# Patient Record
Sex: Female | Born: 1950 | Race: White | Hispanic: No | Marital: Married | Smoking: Never smoker
Health system: Southern US, Community
[De-identification: ages and names within clinical notes are randomized; demographics above are authoritative.]

## PROBLEM LIST (undated history)

## (undated) DIAGNOSIS — E785 Hyperlipidemia, unspecified: Secondary | ICD-10-CM

## (undated) DIAGNOSIS — R1310 Dysphagia, unspecified: Secondary | ICD-10-CM

## (undated) DIAGNOSIS — H3500 Unspecified background retinopathy: Secondary | ICD-10-CM

## (undated) DIAGNOSIS — H269 Unspecified cataract: Secondary | ICD-10-CM

## (undated) DIAGNOSIS — I10 Essential (primary) hypertension: Secondary | ICD-10-CM

## (undated) DIAGNOSIS — I639 Cerebral infarction, unspecified: Secondary | ICD-10-CM

## (undated) DIAGNOSIS — M797 Fibromyalgia: Secondary | ICD-10-CM

## (undated) DIAGNOSIS — F0150 Vascular dementia without behavioral disturbance: Secondary | ICD-10-CM

## (undated) DIAGNOSIS — G9340 Encephalopathy, unspecified: Secondary | ICD-10-CM

## (undated) DIAGNOSIS — F418 Other specified anxiety disorders: Secondary | ICD-10-CM

## (undated) DIAGNOSIS — G459 Transient cerebral ischemic attack, unspecified: Secondary | ICD-10-CM

## (undated) DIAGNOSIS — R41841 Cognitive communication deficit: Secondary | ICD-10-CM

## (undated) DIAGNOSIS — I482 Chronic atrial fibrillation, unspecified: Secondary | ICD-10-CM

## (undated) HISTORY — PX: CHOLECYSTECTOMY: SHX55

## (undated) HISTORY — DX: Other specified anxiety disorders: F41.8

## (undated) HISTORY — DX: Fibromyalgia: M79.7

## (undated) HISTORY — PX: ABDOMINAL HYSTERECTOMY: SHX81

## (undated) HISTORY — PX: LOOP RECORDER IMPLANT: SHX5954

## (undated) HISTORY — DX: Transient cerebral ischemic attack, unspecified: G45.9

## (undated) HISTORY — DX: Essential (primary) hypertension: I10

## (undated) HISTORY — DX: Hyperlipidemia, unspecified: E78.5

---

## 1997-09-14 ENCOUNTER — Encounter: Admission: RE | Admit: 1997-09-14 | Discharge: 1997-12-13 | Payer: Self-pay | Admitting: Family Medicine

## 1998-02-24 ENCOUNTER — Other Ambulatory Visit: Admission: RE | Admit: 1998-02-24 | Discharge: 1998-02-24 | Payer: Self-pay | Admitting: Family Medicine

## 1999-03-03 ENCOUNTER — Encounter: Admission: RE | Admit: 1999-03-03 | Discharge: 1999-04-05 | Payer: Self-pay | Admitting: Family Medicine

## 2003-04-28 ENCOUNTER — Ambulatory Visit (HOSPITAL_COMMUNITY): Admission: RE | Admit: 2003-04-28 | Discharge: 2003-04-28 | Payer: Self-pay | Admitting: Orthopaedic Surgery

## 2003-04-28 ENCOUNTER — Encounter: Payer: Self-pay | Admitting: Orthopaedic Surgery

## 2003-09-21 ENCOUNTER — Ambulatory Visit (HOSPITAL_COMMUNITY): Admission: RE | Admit: 2003-09-21 | Discharge: 2003-09-21 | Payer: Self-pay | Admitting: Family Medicine

## 2004-06-27 ENCOUNTER — Ambulatory Visit: Payer: Self-pay | Admitting: Family Medicine

## 2004-07-03 ENCOUNTER — Ambulatory Visit: Payer: Self-pay | Admitting: Family Medicine

## 2004-08-23 ENCOUNTER — Ambulatory Visit: Payer: Self-pay | Admitting: Family Medicine

## 2004-10-02 ENCOUNTER — Ambulatory Visit: Payer: Self-pay | Admitting: Family Medicine

## 2004-10-09 ENCOUNTER — Ambulatory Visit: Payer: Self-pay | Admitting: Family Medicine

## 2004-11-08 ENCOUNTER — Ambulatory Visit: Payer: Self-pay | Admitting: Family Medicine

## 2004-12-19 ENCOUNTER — Ambulatory Visit: Payer: Self-pay | Admitting: Family Medicine

## 2005-01-23 ENCOUNTER — Ambulatory Visit: Payer: Self-pay | Admitting: Family Medicine

## 2005-03-12 ENCOUNTER — Ambulatory Visit: Payer: Self-pay | Admitting: Family Medicine

## 2005-04-12 ENCOUNTER — Ambulatory Visit: Payer: Self-pay | Admitting: Family Medicine

## 2005-06-05 ENCOUNTER — Ambulatory Visit: Payer: Self-pay | Admitting: Family Medicine

## 2005-06-06 ENCOUNTER — Ambulatory Visit: Payer: Self-pay | Admitting: Family Medicine

## 2005-06-14 ENCOUNTER — Ambulatory Visit: Payer: Self-pay | Admitting: Family Medicine

## 2005-06-18 ENCOUNTER — Ambulatory Visit: Payer: Self-pay | Admitting: Family Medicine

## 2005-07-04 ENCOUNTER — Ambulatory Visit: Payer: Self-pay | Admitting: Cardiology

## 2005-07-17 ENCOUNTER — Ambulatory Visit: Payer: Self-pay

## 2005-07-19 ENCOUNTER — Ambulatory Visit: Payer: Self-pay

## 2005-08-08 ENCOUNTER — Ambulatory Visit: Payer: Self-pay | Admitting: Cardiology

## 2005-10-09 ENCOUNTER — Ambulatory Visit: Payer: Self-pay | Admitting: Family Medicine

## 2005-10-16 ENCOUNTER — Ambulatory Visit (HOSPITAL_COMMUNITY): Admission: RE | Admit: 2005-10-16 | Discharge: 2005-10-16 | Payer: Self-pay | Admitting: Family Medicine

## 2005-11-05 ENCOUNTER — Ambulatory Visit: Payer: Self-pay | Admitting: Family Medicine

## 2006-01-01 ENCOUNTER — Ambulatory Visit: Payer: Self-pay | Admitting: Family Medicine

## 2006-01-16 ENCOUNTER — Ambulatory Visit: Payer: Self-pay | Admitting: Family Medicine

## 2006-03-26 ENCOUNTER — Ambulatory Visit: Payer: Self-pay | Admitting: Family Medicine

## 2006-04-24 ENCOUNTER — Ambulatory Visit: Payer: Self-pay | Admitting: Family Medicine

## 2006-05-28 ENCOUNTER — Ambulatory Visit: Payer: Self-pay | Admitting: Family Medicine

## 2006-06-17 ENCOUNTER — Ambulatory Visit: Payer: Self-pay | Admitting: Family Medicine

## 2006-07-31 ENCOUNTER — Ambulatory Visit: Payer: Self-pay | Admitting: Family Medicine

## 2006-09-04 ENCOUNTER — Ambulatory Visit: Payer: Self-pay | Admitting: Family Medicine

## 2006-10-14 ENCOUNTER — Ambulatory Visit: Payer: Self-pay | Admitting: Family Medicine

## 2006-10-24 ENCOUNTER — Ambulatory Visit: Payer: Self-pay | Admitting: Family Medicine

## 2007-01-23 ENCOUNTER — Ambulatory Visit: Payer: Self-pay | Admitting: Family Medicine

## 2008-01-07 ENCOUNTER — Encounter: Payer: Self-pay | Admitting: Internal Medicine

## 2008-01-07 ENCOUNTER — Inpatient Hospital Stay (HOSPITAL_COMMUNITY): Admission: EM | Admit: 2008-01-07 | Discharge: 2008-01-08 | Payer: Self-pay | Admitting: Emergency Medicine

## 2008-01-07 ENCOUNTER — Ambulatory Visit: Payer: Self-pay | Admitting: Internal Medicine

## 2008-01-13 ENCOUNTER — Ambulatory Visit: Payer: Self-pay

## 2008-01-14 ENCOUNTER — Ambulatory Visit: Payer: Self-pay

## 2010-09-10 ENCOUNTER — Encounter: Payer: Self-pay | Admitting: Family Medicine

## 2011-01-02 NOTE — Discharge Summary (Signed)
NAMEALLEX, KASSON                  ACCOUNT NO.:  [id number]   MEDICAL RECORD NO.:  [id number]          PATIENT TYPE:  INP   LOCATION:  2037                         FACILITY:  [facility]   PHYSICIAN:  Bevelyn Buckles. Bensimhon, MDDATE OF BIRTH:  Dec 18, 1950   DATE OF ADMISSION:  01/07/2008  DATE OF DISCHARGE:  01/08/2008                               DISCHARGE SUMMARY   PROCEDURES:  None.   PRIMARY FINAL DISCHARGE DIAGNOSIS:  Chest pain, cardiac enzymes negative  for myocardial infarction and outpatient Myoview planned.   SECONDARY DIAGNOSES:  1. Allergy to AVITA.  2. Diabetes.  3. Obesity.  4. Hyperlipidemia.  5. Hypothyroidism.  6. Hypertension.  7. Status post cerebrovascular accident in 2005, at [place].  8. Obstructive sleep apnea, on continuous positive airway pressure.  9. Status post surgeries, C-sections,cholecystectomy, tonsillectomy,      and hysterectomy.   FAMILY HISTORY:  Coronary artery disease in her father.   TIME AT DISCHARGE:  36 minutes.   HOSPITAL COURSE:  Ms. Koff is a 60 year old female with no known  history of coronary artery disease.  She had chest pain and went to her  family physician's office.  Her heart rate was 137 and she was  hypertensive.  Her temperature was 100.5.  She had nausea and vomiting.  She was transported to the hospital and admitted for further evaluation.   Her GI symptoms were treated symptomatically and resolved.  Her cardiac  enzymes were negative for MI.  Her chest x-ray was without infiltrate or  edema and her urinalysis was negative.   On Jan 08, 2008, Ms. Hable' GI symptoms had resolved.  Her chest pain  had also resolved.  Her vital signs were within normal limits and she  was afebrile.  Her cardiac enzymes remained negative.  Her EKG was not  acute.  It was felt that she could be safely discharged home as she was  significantly improved and follow up in the office as an outpatient.   DISCHARGE INSTRUCTIONS:  1. Her activity  level is to be increased gradually.  2. She is to stick to a low-fat diabetic diet.  3. She is to follow up in our office with a stress test, on Jan 13, 2008, at 12:30 and with Dr. Gala Romney, on February 10, 2008, at 2:00      p.m..  4. She is to follow up with Dr. Lysbeth Galas, as needed.   DISCHARGE MEDICATIONS:  1. Synthroid 100 mcg daily.  2. Actos 45 mg a day.  3. Janumet 50/1000 mg daily.  4. Nexium 40 mg a day.  5. Metformin 500 mg b.i.d.  6. Avapro 3 mg daily.  7. Tarka 4/240 daily.  8. Ativan 1 mg b.i.d.  9. Effexor XR 150 mg daily.  10.Vytorin 10/20 daily.  11.Triamterene/HCTZ 37.5/25 daily.  12.Aspirin 81 mg daily.  13.Toprol XL 100 mg daily.  14.B12 injections every month.  15.Byetta 5 units daily.      Theodore Demark, PA-C      Bevelyn Buckles. Bensimhon, MD  Electronically Signed  RB/MEDQ  D:  01/08/2008  T:  01/09/2008  Job:  [id number]   cc:   Delaney Meigs, M.D.

## 2011-01-02 NOTE — H&P (Signed)
Dawn Ho, Dawn Ho                  ACCOUNT NO.:  [id number]   MEDICAL RECORD NO.:  [id number]          PATIENT TYPE:  INP   LOCATION:  2037                         FACILITY:  [facility]   PHYSICIAN:  Joellyn Rued, PA-C     DATE OF BIRTH:  Apr 30, 1951   DATE OF ADMISSION:  01/07/2008  DATE OF DISCHARGE:                              HISTORY & PHYSICAL   SUMMARY OF HISTORY:  Dawn Ho is a 60 year old white female who is  referred to [facility] Emergency Room from her primary care's office  secondary to chest discomfort.   Dawn Ho states that she was awakened at approximately 0900, and she  stated that she felt weird.  She noticed an increased heart rate;  however, she did not check her heart rate, blood pressure or her sugar.  She did have associated nausea and vomiting, and then she noticed an  anterior dull chest discomfort that did not radiate nor was it  associated with shortness of breath or diaphoresis.  The patient became  very anxious, began walking around.  Her discomfort did not change with  exertion nor was it pleuritic or changed with movement.  She called her  husband who returned home and checked her blood sugar, which was 227.  He took her to our primary care physician's office.  During transfer to  the doctor, he noticed that her hands were shaking and she continued to  have nausea and vomiting.  The patient denies any prior occurrence and  feels that her symptoms do not resemble her prior heart attack at Dr.  Lysbeth Galas' office where her temperature was checked, but there was a  question about the reliability of the thermometer.  An EKG showed sinus  tachycardia, left axis deviation, no acute changes, delayed R-wave.  EMS  arrived, and they checked her temperature and that was 101.  Her  daughter recalls that her systolic blood pressure was 128, and she  received IV Phenergan during transportation with improvement of her  nausea and vomiting.  Here in the emergency room, she  is fairly  comfortable, but continued to feel her heart racing.   PAST MEDICAL HISTORY:  Notable for IV contrast dye allergy.   MEDICATIONS:  Prior to admission include:  1. Synthroid 100 mcg daily.  2. Avapro 300 mg daily.  3. Nexium 40 mg daily.  4. Maxzide 37.5/25 mg daily.  5. Metformin 500 mg 2 tablets b.i.d.  6. Vytorin 10/20 mg daily.  7. Metoprolol ER 150 mg daily.  8. Tarka 4/240 mg daily.  9. Janumet 50/1000 mg daily.  10.Effexor 150 mg daily.  11.Actos 45 mg daily.  12.Aspirin 81 mg daily.  13.Byetta 10 half a tablet b.i.d.   SUMMARY OF HISTORY:  Her medical history is notable for Hyperlipidemia.  Dr. Lysbeth Galas checked her cholesterol on Dec 23, 2007; this showed total  cholesterol  191, triglycerides 235, HDL 55, LDL 89.  Type 2 diabetes.  On Dec 23, 2007, her hemoglobin A1c was elevated at 7.9.  Hyperthyroidism.  On Dec 23, 2007, TSH was 3.094.  She has  a history of  hypertension, CVA in 2005, which presented as difficulty finding the  proper words.  She has a residual short-term memory loss.  She was  hospitalized at [facility].  Specifics are not available.  She  does not recall who her neurologist is.  She has a history of  obstructive sleep apnea with CPAP, hypertension.  An adenosine Myoview  done in our office on November 2006, showed an EF of 88% with some  inferolateral attenuation.   SURGICAL HISTORY:  Notable for a foot surgery, two C-sections,  cholecystectomy, T&A, and hysterectomy.   SOCIAL HISTORY:  She resides in [place] with her husband.  She has one  son, one daughter who are both alive and well.  She is retired from  [employer].  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  She denies any tobacco, alcohol, drugs, or herbal  medication.  She does not follow a specific diet nor does she exercise.   FAMILY HISTORY:  Her mother is alive at the age of 66 with a history of  hypertension.  Her father died at the age of  85 with his first  myocardial infarction, history of hypertension and diabetes.  She has 2  sisters, one has had a stroke and the other has diabetes.  She has one  brother that died at the age of 3 secondary to a gunshot wound.   REVIEW OF SYSTEMS:  Noted for significant weight gain; however, the  patient cannot relate to how much over how long of a time period.  She  also describes a history of sinus congestion, glasses, some rotten teeth  in the back, dry skin, chronic dyspnea on exertion, nocturia,  postmenopausal depression, anxiety, arthritis, myalgias consistent with  fibromyalgia.  She describes bright red blood per rectum secondary to  hemorrhoids and a history of GERD and peptic ulcer disease.   PHYSICAL EXAMINATION:  GENERAL:  A well-nourished, well-developed  pleasant obese white female in no apparent distress.  Husband is  present.  VITAL SIGNS:  Temperature is 100.5, blood pressure 138/62, heart rate  was 137, and respirations 20.  HEENT:  Grossly unremarkable.  NECK:  Supple without thyromegaly, adenopathy, JVD, or carotid bruits.  CHEST:  Symmetrical excursion.  Lungs sounds were diminished, but clear  to auscultation.  HEART:  PMI is not displaced.  Regular rate and rhythm with a normal S1  and S2.  I did not appreciate murmurs, rubs, clicks, or gallops.  All  pulses are symmetrical and intact.  I did not appreciate any abdominal  bruits.  SKIN:  Integument intact although dry.  ABDOMEN:  Obese.  Bowel sounds are present without organomegaly, masses,  or tenderness.  EXTREMITIES:  Negative cyanosis, clubbing, or edema.  MUSCULOSKELETAL:  Unremarkable.  NEUROLOGICAL:  Unremarkable.   Chest x-ray shows cardiomegaly with central vascular congestion and  bilateral atelectasis.  No signs of edema or pneumonia.  H&H is 11.5 and  34.3, normal indices, platelets 197, WBC is 11.2.  Sodium 139, potassium  3.8, BUN 12, creatinine 0.98, glucose was elevated at 263.  Normal  LFTs.  PTT 24, PT 7.9, CK and MB 67 and 0.7 respectively, and troponin 0.04.   IMPRESSION:  1. Febrile illness with nausea, vomiting, and diarrhea of uncertain      etiology.  2. Sinus tachycardia associated with above.  3. Chest discomfort associated with above with initial negative EKG      and  enzymes.  4. Dye allergy.  5. History as listed per past medical history.   DISPOSITION:  Dr. Gala Romney reviewed the patient's history, spoke with,  and examined the patient and agrees with the above.  We will admit her  and place her on IV fluids.  Her tachycardia seems out of proportion to  her other symptoms.  We will check a urinalysis culture as well as her  stools for C. diff, ova and parasites.  We will also check  a D-dimer echocardiogram, rule out myocardial infarction.  It is felt  that her chest discomfort is associated with the above symptoms.  This  is very atypical and at this time does not seem to represent a cardiac  etiology.  Future recommendations are based on findings.      Joellyn Rued, PA-C     EW/MEDQ  D:  01/07/2008  T:  01/08/2008  Job:  [id number]   cc:   Delaney Meigs, M.D.

## 2011-05-16 LAB — CBC
HCT: 32.7 — ABNORMAL LOW
MCHC: 33.7
MCV: 82.8
Platelets: 195
Platelets: 197
RDW: 15.8 — ABNORMAL HIGH

## 2011-05-16 LAB — TROPONIN I: Troponin I: 0.04

## 2011-05-16 LAB — URINALYSIS, ROUTINE W REFLEX MICROSCOPIC
Hgb urine dipstick: NEGATIVE
Nitrite: NEGATIVE
Specific Gravity, Urine: 1.019
Urobilinogen, UA: 0.2

## 2011-05-16 LAB — CARDIAC PANEL(CRET KIN+CKTOT+MB+TROPI)
CK, MB: 0.8
CK, MB: 1.1
Total CK: 86
Troponin I: 0.03

## 2011-05-16 LAB — DIFFERENTIAL
Basophils Absolute: 0
Eosinophils Relative: 1
Lymphocytes Relative: 12
Neutro Abs: 8 — ABNORMAL HIGH
Neutrophils Relative %: 82 — ABNORMAL HIGH

## 2011-05-16 LAB — COMPREHENSIVE METABOLIC PANEL
BUN: 12
CO2: 23
Calcium: 9.1
Creatinine, Ser: 0.95
GFR calc non Af Amer: >60
Glucose, Bld: 263 — ABNORMAL HIGH

## 2011-05-16 LAB — CK TOTAL AND CKMB (NOT AT ARMC)
Relative Index: INVALID
Total CK: 67

## 2011-05-16 LAB — OCCULT BLOOD X 1 CARD TO LAB, STOOL
Fecal Occult Bld: NEGATIVE
Fecal Occult Bld: NEGATIVE

## 2011-05-16 LAB — APTT: aPTT: 24

## 2011-05-16 LAB — CLOSTRIDIUM DIFFICILE EIA: C difficile Toxins A+B, EIA: NEGATIVE

## 2011-05-16 LAB — PROTIME-INR
INR: 0.9
Prothrombin Time: 11.9

## 2011-05-16 LAB — URINE CULTURE

## 2011-05-16 LAB — URINE MICROSCOPIC-ADD ON

## 2011-05-16 LAB — D-DIMER, QUANTITATIVE: D-Dimer, Quant: 0.43

## 2011-05-16 LAB — LIPASE, BLOOD: Lipase: 24

## 2011-08-07 ENCOUNTER — Ambulatory Visit (INDEPENDENT_AMBULATORY_CARE_PROVIDER_SITE_OTHER): Payer: Medicare Other | Admitting: Cardiology

## 2011-08-07 ENCOUNTER — Encounter: Payer: Self-pay | Admitting: Cardiology

## 2011-08-07 ENCOUNTER — Other Ambulatory Visit: Payer: Self-pay | Admitting: Cardiology

## 2011-08-07 ENCOUNTER — Encounter: Payer: Self-pay | Admitting: *Deleted

## 2011-08-07 ENCOUNTER — Telehealth: Payer: Self-pay | Admitting: *Deleted

## 2011-08-07 DIAGNOSIS — M797 Fibromyalgia: Secondary | ICD-10-CM

## 2011-08-07 DIAGNOSIS — IMO0001 Reserved for inherently not codable concepts without codable children: Secondary | ICD-10-CM

## 2011-08-07 DIAGNOSIS — F341 Dysthymic disorder: Secondary | ICD-10-CM

## 2011-08-07 DIAGNOSIS — E785 Hyperlipidemia, unspecified: Secondary | ICD-10-CM

## 2011-08-07 DIAGNOSIS — E119 Type 2 diabetes mellitus without complications: Secondary | ICD-10-CM

## 2011-08-07 DIAGNOSIS — F418 Other specified anxiety disorders: Secondary | ICD-10-CM

## 2011-08-07 DIAGNOSIS — R079 Chest pain, unspecified: Secondary | ICD-10-CM

## 2011-08-07 DIAGNOSIS — M940 Chondrocostal junction syndrome [Tietze]: Secondary | ICD-10-CM

## 2011-08-07 DIAGNOSIS — E669 Obesity, unspecified: Secondary | ICD-10-CM

## 2011-08-07 DIAGNOSIS — Z8673 Personal history of transient ischemic attack (TIA), and cerebral infarction without residual deficits: Secondary | ICD-10-CM

## 2011-08-07 NOTE — Assessment & Plan Note (Signed)
Patient's chest pain is very atypical. The pain is nonexertional. Pain is reproducible with palpation and is reminiscent of her fibromyalgias. The patient does have multiple risk factors and a high pretest probability of coronary artery disease and therefore we will proceed with a two-day Lexa scan protocol. If within normal limits however I do not think the patient needs a cardiac catheterization. I made no changes in her medical regimen but I did add when necessary nitroglycerin. She can continue on aspirin the meanwhile.

## 2011-08-07 NOTE — Telephone Encounter (Signed)
LEXISCAN CARDIOLITE  (2 DAY) WEIGHT 239  5'  Scheduled for 12/27 & 12/28 @ [facility] Checking pecert

## 2011-08-07 NOTE — Assessment & Plan Note (Signed)
Patient does have acute costochondritis.

## 2011-08-07 NOTE — Assessment & Plan Note (Signed)
No recurrence in several years. I suspect the patient has been screened with carotid Dopplers. I will leave further evaluation and workup is needed in the future to her primary care physician

## 2011-08-07 NOTE — Assessment & Plan Note (Signed)
I counseled the patient regarding increasing her activity and try to do daily walking as is his good therapy for fibromyalgias diabetes mellitus and obesity.

## 2011-08-07 NOTE — Assessment & Plan Note (Signed)
Fibromyalgias are not controlled with gabapentin. I recommended to the patient to discuss with her primary care physician the use of Sevella and Cymbalta, rather than Paxil.

## 2011-08-07 NOTE — Progress Notes (Signed)
Dawn Bottoms, MD, [facility] Board Certified in Adult Cardiovascular Medicine,Internal Medicine and Critical Care Medicine    CC: Chest pain  HPI:  The patient is a 60 year old female with no prior history of coronary artery disease. According to the patient's history she's been evaluated with what appears to be stress testing maybe 5 years ago in [place]. Since that time she's had no chest pain. She has a long-standing history of fibromyalgia affecting multiple areas including shoulders back neck buttocks and thighs. She stated her fibromyalgia started in 1993 after stressful event involving her niece with died after heart transplant. Since that time the patient also has struggled with anxiety and depression. The patient has now been referred by Dr. Virgina Organ because of a history of approximately 4 days of left-sided chest pain. The patient experiences pain as a deep pain she described this as a 2/10 in intensity typically last one hour and is not worsened with exertion. It appears to occur at rest. She states that there is a particular area of the left lower sternum where she applies pressure and she has severe pain. There is no associated shortness of breath or diaphoresis. Despite her obesity she states that he has good exercise tolerance. She's not limited in performing her daily activities. She also reports no palpitations, presyncope or syncope. EKG today shows no acute changes.     PMH: reviewed and listed in Problem List in Electronic Records (and see below) Past Medical History  Diagnosis Date  . Fibromyalgia   . Diabetes mellitus   . Hypertension   . Dyslipidemia   . TIA (transient ischemic attack)   . Depression with anxiety    Past Surgical History  Procedure Date  . Cholecystectomy   . Cesarean section       Allergies/SH/FHX : available in Electronic Records for review Allergies  Allergen Reactions  . Ivp Dye (Iodinated Diagnostic Agents)   . Penicillins  Swelling and Rash   History   Social History  . Marital Status: Married    Spouse Name: N/A    Number of Children: N/A  . Years of Education: N/A   Occupational History  . Not on file.   Social History Main Topics  . Smoking status: Never Smoker   . Smokeless tobacco: Never Used  . Alcohol Use: No  . Drug Use: No  . Sexually Active: Not on file   Other Topics Concern  . Not on file   Social History Narrative  . No narrative on file   Family History  Problem Relation Age of Onset  . Heart disease Father     Medications: Current Outpatient Prescriptions  Medication Sig Dispense Refill  . acetaminophen (TYLENOL) 500 MG tablet Take 500 mg by mouth every 6 (six) hours as needed.        Marland Kitchen amLODipine (NORVASC) 10 MG tablet Take 10 mg by mouth daily.        Marland Kitchen aspirin EC 81 MG tablet Take 81 mg by mouth daily.        . cyanocobalamin (,VITAMIN B-12,) 1000 MCG/ML injection Inject 1,000 mcg into the muscle every 30 (thirty) days.        . ergocalciferol (VITAMIN D2) 50000 UNITS capsule Take 50,000 Units by mouth once a week.        . furosemide (LASIX) 40 MG tablet Take 40 mg by mouth daily.        Marland Kitchen gabapentin (NEURONTIN) 300 MG capsule Take 300 mg by mouth  2 (two) times daily.        Marland Kitchen glipiZIDE (GLUCOTROL XL) 10 MG 24 hr tablet Take 10 mg by mouth daily.        . insulin NPH-insulin regular (NOVOLIN 70/30) (70-30) 100 UNIT/ML injection Inject into the skin as directed.        . insulin regular (NOVOLIN R,HUMULIN R) 100 units/mL injection Inject into the skin as directed.        Marland Kitchen levothyroxine (SYNTHROID, LEVOTHROID) 125 MCG tablet Take 125 mcg by mouth daily.        Marland Kitchen LORazepam (ATIVAN) 1 MG tablet Take 1 mg by mouth 2 (two) times daily.        Marland Kitchen losartan (COZAAR) 100 MG tablet Take 100 mg by mouth daily.        . meclizine (ANTIVERT) 12.5 MG tablet Take 12.5 mg by mouth 2 (two) times daily.        . meloxicam (MOBIC) 7.5 MG tablet Take 7.5 mg by mouth daily.        .  metFORMIN (GLUCOPHAGE) 500 MG tablet Take 500 mg by mouth 2 (two) times daily with a meal.        . metoprolol (TOPROL-XL) 100 MG 24 hr tablet Take 150 mg by mouth daily.        Marland Kitchen omeprazole (PRILOSEC) 20 MG capsule Take 20 mg by mouth daily.        Marland Kitchen PARoxetine (PAXIL) 20 MG tablet Take 20 mg by mouth every morning.        . potassium chloride SA (K-DUR,KLOR-CON) 20 MEQ tablet Take 20 mEq by mouth daily.        . simvastatin (ZOCOR) 20 MG tablet Take 20 mg by mouth at bedtime.          ROS: No nausea or vomiting. No fever or chills.No melena or hematochezia.No bleeding.No claudication. No visual changes. Multiple areas of muscle pain as described above.  Physical Exam: BP 138/77  Pulse 76  Ht 5' (1.524 m)  Wt 239 lb (108.41 kg)  BMI 46.68 kg/m2 General: Overweight white female but in no apparent distress. Neck: JVP is not measurable. No thyromegaly nonnodular thyroid. Normal carotid upstroke and no carotid bruits. Lungs: Clear breath sounds bilaterally without wheezing Cardiac: Regular rate and rhythm with normal S1-S2. No pathological murmurs. No S3 or S4 Chest: Tenderness on minimal palpation of the left costal sternal edge. Vascular: No edema. Normal dorsalis pedis and posterior tibial pulses Skin: Warm and dry  12lead ECG: Normal sinus rhythm with no acute changes despite the fact that the patient is having chest pain. Limited bedside ECHO: Technically difficult but apparent normal LV systolic function as well as normal RV systolic function. Aortic valve and mitral valve appeared to open normally   Assessment and Plan

## 2011-08-07 NOTE — Patient Instructions (Signed)
   Lexiscan stress test - 2 day protocol If the results of your test are normal or stable, you will receive a letter.  If they are abnormal, the nurse will contact you by phone. Follow up in  1 month - see above

## 2011-08-08 NOTE — Telephone Encounter (Signed)
PT HAS BLUE MCR.  ZOXW#[id number] EXP 09-06-11

## 2011-08-30 ENCOUNTER — Telehealth: Payer: Self-pay | Admitting: *Deleted

## 2011-08-30 NOTE — Telephone Encounter (Signed)
Lexiscan stress test - 2 day protocol Per Dr. Andee Lineman Weight  239 Diag 786.50  Monday and Tuesday, January 28th and 29th , 2013 @ [facility]   Her other authorization expires on the 17th and patient states she can not go test before this date.

## 2011-09-13 ENCOUNTER — Ambulatory Visit: Payer: Medicare Other | Admitting: Cardiology

## 2011-09-14 NOTE — Telephone Encounter (Signed)
[employer] VWUJ#[id number] Exp 10-12-11

## 2014-09-30 ENCOUNTER — Telehealth: Payer: Self-pay | Admitting: Family Medicine

## 2014-09-30 NOTE — Telephone Encounter (Signed)
Pt wtbs as a new pt, pt was given new pt appt with Dr.Miller 3/23 at 10:30, pt aware to arrive 15 minutes prior to appt with a copy of insurance card and list of all current medications.

## 2014-10-11 ENCOUNTER — Telehealth: Payer: Self-pay | Admitting: Family Medicine

## 2014-10-11 NOTE — Telephone Encounter (Signed)
Patient was advised to call previous provider and see if he will give her enough medication until she is seen by her new provider.

## 2014-10-13 ENCOUNTER — Encounter (HOSPITAL_COMMUNITY): Payer: Self-pay | Admitting: *Deleted

## 2014-10-13 ENCOUNTER — Inpatient Hospital Stay (HOSPITAL_COMMUNITY): Payer: Medicare Other

## 2014-10-13 ENCOUNTER — Inpatient Hospital Stay (HOSPITAL_COMMUNITY)
Admission: EM | Admit: 2014-10-13 | Discharge: 2014-10-18 | DRG: 065 | Disposition: A | Discharge status: Skilled Nursing Facility | Payer: Medicare Other | Attending: Family Medicine | Admitting: Family Medicine

## 2014-10-13 ENCOUNTER — Emergency Department (HOSPITAL_COMMUNITY): Payer: Medicare Other

## 2014-10-13 DIAGNOSIS — Z794 Long term (current) use of insulin: Secondary | ICD-10-CM

## 2014-10-13 DIAGNOSIS — I6340 Cerebral infarction due to embolism of unspecified cerebral artery: Secondary | ICD-10-CM | POA: Diagnosis not present

## 2014-10-13 DIAGNOSIS — W06XXXA Fall from bed, initial encounter: Secondary | ICD-10-CM | POA: Diagnosis present

## 2014-10-13 DIAGNOSIS — R4701 Aphasia: Secondary | ICD-10-CM | POA: Diagnosis present

## 2014-10-13 DIAGNOSIS — Z91041 Radiographic dye allergy status: Secondary | ICD-10-CM | POA: Diagnosis not present

## 2014-10-13 DIAGNOSIS — E039 Hypothyroidism, unspecified: Secondary | ICD-10-CM | POA: Diagnosis present

## 2014-10-13 DIAGNOSIS — R29810 Facial weakness: Secondary | ICD-10-CM | POA: Diagnosis present

## 2014-10-13 DIAGNOSIS — F418 Other specified anxiety disorders: Secondary | ICD-10-CM | POA: Diagnosis present

## 2014-10-13 DIAGNOSIS — I6602 Occlusion and stenosis of left middle cerebral artery: Secondary | ICD-10-CM | POA: Diagnosis not present

## 2014-10-13 DIAGNOSIS — M797 Fibromyalgia: Secondary | ICD-10-CM | POA: Diagnosis present

## 2014-10-13 DIAGNOSIS — I6529 Occlusion and stenosis of unspecified carotid artery: Secondary | ICD-10-CM | POA: Diagnosis not present

## 2014-10-13 DIAGNOSIS — M79642 Pain in left hand: Secondary | ICD-10-CM | POA: Diagnosis not present

## 2014-10-13 DIAGNOSIS — I639 Cerebral infarction, unspecified: Secondary | ICD-10-CM

## 2014-10-13 DIAGNOSIS — Z7982 Long term (current) use of aspirin: Secondary | ICD-10-CM | POA: Diagnosis not present

## 2014-10-13 DIAGNOSIS — E785 Hyperlipidemia, unspecified: Secondary | ICD-10-CM | POA: Diagnosis not present

## 2014-10-13 DIAGNOSIS — R41841 Cognitive communication deficit: Secondary | ICD-10-CM | POA: Diagnosis not present

## 2014-10-13 DIAGNOSIS — E669 Obesity, unspecified: Secondary | ICD-10-CM | POA: Diagnosis not present

## 2014-10-13 DIAGNOSIS — G459 Transient cerebral ischemic attack, unspecified: Secondary | ICD-10-CM | POA: Diagnosis not present

## 2014-10-13 DIAGNOSIS — I69828 Other speech and language deficits following other cerebrovascular disease: Secondary | ICD-10-CM | POA: Diagnosis not present

## 2014-10-13 DIAGNOSIS — M6281 Muscle weakness (generalized): Secondary | ICD-10-CM | POA: Diagnosis not present

## 2014-10-13 DIAGNOSIS — E119 Type 2 diabetes mellitus without complications: Secondary | ICD-10-CM | POA: Diagnosis not present

## 2014-10-13 DIAGNOSIS — I63031 Cerebral infarction due to thrombosis of right carotid artery: Secondary | ICD-10-CM | POA: Diagnosis not present

## 2014-10-13 DIAGNOSIS — R278 Other lack of coordination: Secondary | ICD-10-CM | POA: Diagnosis not present

## 2014-10-13 DIAGNOSIS — R079 Chest pain, unspecified: Secondary | ICD-10-CM | POA: Diagnosis not present

## 2014-10-13 DIAGNOSIS — Z880 Allergy status to penicillin: Secondary | ICD-10-CM | POA: Diagnosis not present

## 2014-10-13 DIAGNOSIS — F329 Major depressive disorder, single episode, unspecified: Secondary | ICD-10-CM | POA: Diagnosis not present

## 2014-10-13 DIAGNOSIS — Z79899 Other long term (current) drug therapy: Secondary | ICD-10-CM | POA: Diagnosis not present

## 2014-10-13 DIAGNOSIS — R42 Dizziness and giddiness: Secondary | ICD-10-CM | POA: Diagnosis not present

## 2014-10-13 DIAGNOSIS — R262 Difficulty in walking, not elsewhere classified: Secondary | ICD-10-CM | POA: Diagnosis not present

## 2014-10-13 DIAGNOSIS — I10 Essential (primary) hypertension: Secondary | ICD-10-CM | POA: Diagnosis not present

## 2014-10-13 DIAGNOSIS — E08329 Diabetes mellitus due to underlying condition with mild nonproliferative diabetic retinopathy without macular edema: Secondary | ICD-10-CM | POA: Diagnosis not present

## 2014-10-13 DIAGNOSIS — Z6841 Body Mass Index (BMI) 40.0 and over, adult: Secondary | ICD-10-CM

## 2014-10-13 DIAGNOSIS — I480 Paroxysmal atrial fibrillation: Secondary | ICD-10-CM | POA: Diagnosis not present

## 2014-10-13 DIAGNOSIS — I6310 Cerebral infarction due to embolism of unspecified precerebral artery: Secondary | ICD-10-CM | POA: Diagnosis not present

## 2014-10-13 DIAGNOSIS — R51 Headache: Secondary | ICD-10-CM | POA: Diagnosis not present

## 2014-10-13 DIAGNOSIS — R531 Weakness: Secondary | ICD-10-CM | POA: Diagnosis not present

## 2014-10-13 DIAGNOSIS — I6601 Occlusion and stenosis of right middle cerebral artery: Secondary | ICD-10-CM | POA: Diagnosis not present

## 2014-10-13 DIAGNOSIS — F419 Anxiety disorder, unspecified: Secondary | ICD-10-CM | POA: Diagnosis not present

## 2014-10-13 DIAGNOSIS — I6931 Cognitive deficits following cerebral infarction: Secondary | ICD-10-CM | POA: Diagnosis not present

## 2014-10-13 DIAGNOSIS — R404 Transient alteration of awareness: Secondary | ICD-10-CM | POA: Diagnosis not present

## 2014-10-13 DIAGNOSIS — I340 Nonrheumatic mitral (valve) insufficiency: Secondary | ICD-10-CM | POA: Diagnosis not present

## 2014-10-13 HISTORY — DX: Cerebral infarction, unspecified: I63.9

## 2014-10-13 LAB — I-STAT CHEM 8, ED
BUN: 18 mg/dL (ref 6–23)
CHLORIDE: 101 mmol/L (ref 96–112)
Calcium, Ion: 1.27 mmol/L (ref 1.13–1.30)
Creatinine, Ser: 0.9 mg/dL (ref 0.50–1.10)
Glucose, Bld: 151 mg/dL — ABNORMAL HIGH (ref 70–99)
HEMATOCRIT: 46 % (ref 36.0–46.0)
Hemoglobin: 15.6 g/dL — ABNORMAL HIGH (ref 12.0–15.0)
POTASSIUM: 3.7 mmol/L (ref 3.5–5.1)
Sodium: 142 mmol/L (ref 135–145)
TCO2: 29 mmol/L (ref 0–100)

## 2014-10-13 LAB — COMPREHENSIVE METABOLIC PANEL
ALT: 27 U/L (ref 0–35)
AST: 42 U/L — ABNORMAL HIGH (ref 0–37)
Albumin: 3.9 g/dL (ref 3.5–5.2)
Alkaline Phosphatase: 89 U/L (ref 39–117)
Anion gap: 12 (ref 5–15)
BUN: 16 mg/dL (ref 6–23)
CALCIUM: 10.4 mg/dL (ref 8.4–10.5)
CO2: 28 mmol/L (ref 19–32)
Chloride: 102 mmol/L (ref 96–112)
Creatinine, Ser: 0.83 mg/dL (ref 0.50–1.10)
GFR, EST AFRICAN AMERICAN: 85 mL/min — AB (ref >90)
GFR, EST NON AFRICAN AMERICAN: 73 mL/min — AB (ref >90)
GLUCOSE: 148 mg/dL — AB (ref 70–99)
Potassium: 3.8 mmol/L (ref 3.5–5.1)
Sodium: 142 mmol/L (ref 135–145)
TOTAL PROTEIN: 7.3 g/dL (ref 6.0–8.3)
Total Bilirubin: 1 mg/dL (ref 0.3–1.2)

## 2014-10-13 LAB — RAPID URINE DRUG SCREEN, HOSP PERFORMED
AMPHETAMINES: NOT DETECTED
BARBITURATES: NOT DETECTED
Benzodiazepines: NOT DETECTED
Cocaine: NOT DETECTED
OPIATES: NOT DETECTED
TETRAHYDROCANNABINOL: NOT DETECTED

## 2014-10-13 LAB — DIFFERENTIAL
BASOS PCT: 0 % (ref 0–1)
Basophils Absolute: 0 10*3/uL (ref 0.0–0.1)
EOS PCT: 2 % (ref 0–5)
Eosinophils Absolute: 0.2 10*3/uL (ref 0.0–0.7)
LYMPHS ABS: 2.7 10*3/uL (ref 0.7–4.0)
LYMPHS PCT: 30 % (ref 12–46)
MONOS PCT: 8 % (ref 3–12)
Monocytes Absolute: 0.7 10*3/uL (ref 0.1–1.0)
NEUTROS ABS: 5.3 10*3/uL (ref 1.7–7.7)
NEUTROS PCT: 60 % (ref 43–77)

## 2014-10-13 LAB — CBC
HCT: 42.8 % (ref 36.0–46.0)
Hemoglobin: 14.2 g/dL (ref 12.0–15.0)
MCH: 30.9 pg (ref 26.0–34.0)
MCHC: 33.2 g/dL (ref 30.0–36.0)
MCV: 93.2 fL (ref 78.0–100.0)
PLATELETS: 197 10*3/uL (ref 150–400)
RBC: 4.59 MIL/uL (ref 3.87–5.11)
RDW: 14 % (ref 11.5–15.5)
WBC: 9 10*3/uL (ref 4.0–10.5)

## 2014-10-13 LAB — URINE MICROSCOPIC-ADD ON

## 2014-10-13 LAB — URINALYSIS, ROUTINE W REFLEX MICROSCOPIC
Bilirubin Urine: NEGATIVE
GLUCOSE, UA: NEGATIVE mg/dL
Hgb urine dipstick: NEGATIVE
KETONES UR: NEGATIVE mg/dL
LEUKOCYTES UA: NEGATIVE
Nitrite: NEGATIVE
Protein, ur: 30 mg/dL — AB
Specific Gravity, Urine: 1.012 (ref 1.005–1.030)
Urobilinogen, UA: 0.2 mg/dL (ref 0.0–1.0)
pH: 8 (ref 5.0–8.0)

## 2014-10-13 LAB — I-STAT TROPONIN, ED: Troponin i, poc: 0 ng/mL (ref 0.00–0.08)

## 2014-10-13 LAB — GLUCOSE, CAPILLARY: GLUCOSE-CAPILLARY: 183 mg/dL — AB (ref 70–99)

## 2014-10-13 LAB — APTT: aPTT: 27 seconds (ref 24–37)

## 2014-10-13 LAB — ETHANOL

## 2014-10-13 LAB — PROTIME-INR
INR: 0.92 (ref 0.00–1.49)
PROTHROMBIN TIME: 12.4 s (ref 11.6–15.2)

## 2014-10-13 MED ORDER — FUROSEMIDE 40 MG PO TABS
60.0000 mg | ORAL_TABLET | Freq: Every day | ORAL | Status: DC
  Administered 2014-10-14 – 2014-10-18 (×5): 60 mg via ORAL
  Filled 2014-10-13 (×12): qty 1

## 2014-10-13 MED ORDER — ENOXAPARIN SODIUM 40 MG/0.4ML ~~LOC~~ SOLN
40.0000 mg | SUBCUTANEOUS | Status: DC
  Administered 2014-10-13 – 2014-10-17 (×5): 40 mg via SUBCUTANEOUS
  Filled 2014-10-13 (×5): qty 0.4

## 2014-10-13 MED ORDER — INSULIN GLARGINE 100 UNIT/ML ~~LOC~~ SOLN
55.0000 [IU] | Freq: Every day | SUBCUTANEOUS | Status: DC
  Administered 2014-10-13 – 2014-10-17 (×5): 55 [IU] via SUBCUTANEOUS
  Filled 2014-10-13 (×6): qty 0.55

## 2014-10-13 MED ORDER — LEVOTHYROXINE SODIUM 125 MCG PO TABS
125.0000 ug | ORAL_TABLET | Freq: Every day | ORAL | Status: DC
  Administered 2014-10-14 – 2014-10-18 (×5): 125 ug via ORAL
  Filled 2014-10-13 (×7): qty 1

## 2014-10-13 MED ORDER — METOPROLOL SUCCINATE ER 25 MG PO TB24
150.0000 mg | ORAL_TABLET | Freq: Every day | ORAL | Status: DC
  Administered 2014-10-13 – 2014-10-14 (×2): 150 mg via ORAL
  Filled 2014-10-13: qty 2
  Filled 2014-10-13 (×2): qty 1
  Filled 2014-10-13: qty 2
  Filled 2014-10-13: qty 1
  Filled 2014-10-13: qty 6
  Filled 2014-10-13: qty 2

## 2014-10-13 MED ORDER — HYDRALAZINE HCL 20 MG/ML IJ SOLN: 5.0000 mg | INTRAMUSCULAR | Status: DC | PRN

## 2014-10-13 MED ORDER — TRAMADOL HCL 50 MG PO TABS
50.0000 mg | ORAL_TABLET | Freq: Four times a day (QID) | ORAL | Status: DC | PRN
  Administered 2014-10-14 – 2014-10-17 (×2): 50 mg via ORAL
  Filled 2014-10-13 (×2): qty 1

## 2014-10-13 MED ORDER — STROKE: EARLY STAGES OF RECOVERY BOOK
Freq: Once | Status: AC
  Administered 2014-10-13: 21:00:00
  Filled 2014-10-13: qty 1

## 2014-10-13 MED ORDER — POTASSIUM CHLORIDE CRYS ER 20 MEQ PO TBCR
20.0000 meq | EXTENDED_RELEASE_TABLET | Freq: Every day | ORAL | Status: DC
  Administered 2014-10-14 – 2014-10-18 (×5): 20 meq via ORAL
  Filled 2014-10-13 (×5): qty 1

## 2014-10-13 MED ORDER — FUROSEMIDE 20 MG PO TABS: 60.0000 mg | ORAL_TABLET | Freq: Every day | ORAL | Status: DC

## 2014-10-13 MED ORDER — HYDROCODONE-ACETAMINOPHEN 5-325 MG PO TABS
1.0000 | ORAL_TABLET | ORAL | Status: DC | PRN
  Administered 2014-10-13: 1 via ORAL
  Filled 2014-10-13 (×2): qty 1

## 2014-10-13 MED ORDER — POTASSIUM CHLORIDE CRYS ER 20 MEQ PO TBCR: 20.0000 meq | EXTENDED_RELEASE_TABLET | Freq: Every day | ORAL | Status: DC

## 2014-10-13 MED ORDER — SENNOSIDES-DOCUSATE SODIUM 8.6-50 MG PO TABS
1.0000 | ORAL_TABLET | Freq: Every evening | ORAL | Status: DC | PRN
  Filled 2014-10-13: qty 1

## 2014-10-13 MED ORDER — PAROXETINE HCL 20 MG PO TABS
20.0000 mg | ORAL_TABLET | Freq: Every day | ORAL | Status: DC
  Administered 2014-10-14 – 2014-10-18 (×5): 20 mg via ORAL
  Filled 2014-10-13 (×5): qty 1

## 2014-10-13 MED ORDER — LORAZEPAM 1 MG PO TABS
1.0000 mg | ORAL_TABLET | Freq: Two times a day (BID) | ORAL | Status: DC
  Administered 2014-10-13 – 2014-10-18 (×9): 1 mg via ORAL
  Filled 2014-10-13 (×10): qty 1

## 2014-10-13 MED ORDER — GABAPENTIN 300 MG PO CAPS
300.0000 mg | ORAL_CAPSULE | Freq: Two times a day (BID) | ORAL | Status: DC
  Administered 2014-10-13 – 2014-10-14 (×3): 300 mg via ORAL
  Filled 2014-10-13 (×4): qty 1

## 2014-10-13 MED ORDER — ASPIRIN 325 MG PO TABS
325.0000 mg | ORAL_TABLET | Freq: Every day | ORAL | Status: DC
  Administered 2014-10-14 – 2014-10-16 (×3): 325 mg via ORAL
  Filled 2014-10-13 (×3): qty 1

## 2014-10-13 MED ORDER — PANTOPRAZOLE SODIUM 40 MG PO TBEC
40.0000 mg | DELAYED_RELEASE_TABLET | Freq: Every day | ORAL | Status: DC
  Administered 2014-10-14 – 2014-10-18 (×5): 40 mg via ORAL
  Filled 2014-10-13 (×5): qty 1

## 2014-10-13 MED ORDER — LOSARTAN POTASSIUM 50 MG PO TABS
100.0000 mg | ORAL_TABLET | Freq: Every day | ORAL | Status: DC
Start: 2014-10-14 — End: 2014-10-18
  Administered 2014-10-14 – 2014-10-18 (×5): 100 mg via ORAL
  Filled 2014-10-13 (×7): qty 2

## 2014-10-13 MED ORDER — INSULIN ASPART 100 UNIT/ML ~~LOC~~ SOLN
8.0000 [IU] | Freq: Three times a day (TID) | SUBCUTANEOUS | Status: DC
  Administered 2014-10-14 – 2014-10-18 (×12): 8 [IU] via SUBCUTANEOUS

## 2014-10-13 MED ORDER — MELOXICAM 7.5 MG PO TABS
7.5000 mg | ORAL_TABLET | Freq: Every day | ORAL | Status: DC
  Administered 2014-10-14 – 2014-10-18 (×5): 7.5 mg via ORAL
  Filled 2014-10-13 (×5): qty 1

## 2014-10-13 NOTE — ED Notes (Addendum)
Pt in via EMS stating that she woke up this morning around 10am with with left sided weakness, aphasia, and left facial droop, also headache since last night, IV started PTA, pt alert and oriented, no distress noted, hypertensive during transport, CBG 199, pt denies pain on arrival

## 2014-10-13 NOTE — ED Provider Notes (Signed)
CSN: [id number]     Arrival date & time 10/13/14  1448 History   First MD Initiated Contact with Patient 10/13/14 1506     Chief Complaint  Patient presents with  . Stroke Symptoms   (Consider location/radiation/quality/duration/timing/severity/associated sxs/prior Treatment) HPI  Dawn Ho is a 64 yo female with history of DM, htn, dyslipidemia, and TIA, presenting with report of facial droop this am.  She states she went to bed last night and had a headache.  When she woke up this morning around 10 am, she noticed her mouth felt like it was not working right and her left arm and leg were tingling and weak. She still reports a mild posterior headache and rates it as 4/10. She also complains of pain th her left hand from an unrelated fall from the bed 2 days ago.  She denies any recent illness, fevers, chills, nausea, vomiting, vision changes or head injury.   Past Medical History  Diagnosis Date  . Fibromyalgia   . Diabetes mellitus   . Hypertension   . Dyslipidemia   . TIA (transient ischemic attack)   . Depression with anxiety    Past Surgical History  Procedure Laterality Date  . Cholecystectomy    . Cesarean section     Family History  Problem Relation Age of Onset  . Heart disease Father    History  Substance Use Topics  . Smoking status: Never Smoker   . Smokeless tobacco: Never Used  . Alcohol Use: No   OB History    No data available     Review of Systems  Constitutional: Negative for fever and chills.  HENT: Negative for sore throat.   Eyes: Negative for visual disturbance.  Respiratory: Negative for cough and shortness of breath.   Cardiovascular: Negative for chest pain and leg swelling.  Gastrointestinal: Negative for nausea, vomiting and diarrhea.  Genitourinary: Negative for dysuria.  Musculoskeletal: Negative for myalgias.  Skin: Negative for rash.  Neurological: Positive for facial asymmetry, speech difficulty, weakness, light-headedness, numbness  and headaches.    Allergies  Ivp dye and Penicillins  Home Medications   Prior to Admission medications   Medication Sig Start Date End Date Taking? Authorizing Provider  acetaminophen (TYLENOL) 500 MG tablet Take 500 mg by mouth every 6 (six) hours as needed.      Historical Provider, MD  amLODipine (NORVASC) 10 MG tablet Take 10 mg by mouth daily.      Historical Provider, MD  aspirin EC 81 MG tablet Take 81 mg by mouth daily.      Historical Provider, MD  cyanocobalamin (,VITAMIN B-12,) 1000 MCG/ML injection Inject 1,000 mcg into the muscle every 30 (thirty) days.      Historical Provider, MD  ergocalciferol (VITAMIN D2) 50000 UNITS capsule Take 50,000 Units by mouth once a week.      Historical Provider, MD  furosemide (LASIX) 40 MG tablet Take 40 mg by mouth daily.      Historical Provider, MD  gabapentin (NEURONTIN) 300 MG capsule Take 300 mg by mouth 2 (two) times daily.      Historical Provider, MD  glipiZIDE (GLUCOTROL XL) 10 MG 24 hr tablet Take 10 mg by mouth daily.      Historical Provider, MD  insulin NPH-insulin regular (NOVOLIN 70/30) (70-30) 100 UNIT/ML injection Inject into the skin as directed.      Historical Provider, MD  insulin regular (NOVOLIN R,HUMULIN R) 100 units/mL injection Inject into the skin as directed.  Historical Provider, MD  levothyroxine (SYNTHROID, LEVOTHROID) 125 MCG tablet Take 125 mcg by mouth daily.      Historical Provider, MD  LORazepam (ATIVAN) 1 MG tablet Take 1 mg by mouth 2 (two) times daily.      Historical Provider, MD  losartan (COZAAR) 100 MG tablet Take 100 mg by mouth daily.      Historical Provider, MD  meclizine (ANTIVERT) 12.5 MG tablet Take 12.5 mg by mouth 2 (two) times daily.      Historical Provider, MD  meloxicam (MOBIC) 7.5 MG tablet Take 7.5 mg by mouth daily.      Historical Provider, MD  metFORMIN (GLUCOPHAGE) 500 MG tablet Take 500 mg by mouth 2 (two) times daily with a meal.      Historical Provider, MD  metoprolol  (TOPROL-XL) 100 MG 24 hr tablet Take 150 mg by mouth daily.      Historical Provider, MD  omeprazole (PRILOSEC) 20 MG capsule Take 20 mg by mouth daily.      Historical Provider, MD  PARoxetine (PAXIL) 20 MG tablet Take 20 mg by mouth every morning.      Historical Provider, MD  potassium chloride SA (K-DUR,KLOR-CON) 20 MEQ tablet Take 20 mEq by mouth daily.      Historical Provider, MD  simvastatin (ZOCOR) 20 MG tablet Take 20 mg by mouth at bedtime.      Historical Provider, MD   BP 163/84 mmHg  Pulse 105  Temp(Src) 98.8 F (37.1 C) (Oral)  Resp 20  SpO2 92% Physical Exam  Constitutional: She is oriented to person, place, and time. She appears well-developed and well-nourished. No distress.  HENT:  Head: Atraumatic.  Mouth/Throat: Oropharynx is clear and moist. No oropharyngeal exudate.  Left sided facial droop  Eyes: Conjunctivae are normal.  Neck: Neck supple. No thyromegaly present.  Cardiovascular: Normal rate, regular rhythm and intact distal pulses.   Pulmonary/Chest: Effort normal and breath sounds normal. No respiratory distress. She has no wheezes. She has no rales. She exhibits no tenderness.  Abdominal: Soft. There is no tenderness.  Musculoskeletal: She exhibits no tenderness.  Lymphadenopathy:    She has no cervical adenopathy.  Neurological: She is alert and oriented to person, place, and time. GCS eye subscore is 4. GCS verbal subscore is 5. GCS motor subscore is 6.  Unequal strength: rt> lt. 5/5 strength in rt upper and lower extremities. 4/5 strength in lt upper and lower extremities.    Skin: Skin is warm and dry. No rash noted. She is not diaphoretic.  Psychiatric: She has a normal mood and affect.  Nursing note and vitals reviewed.   ED Course  Procedures (including critical care time) Labs Review Labs Reviewed  COMPREHENSIVE METABOLIC PANEL - Abnormal; Notable for the following:    Glucose, Bld 148 (*)    AST 42 (*)    GFR calc non Af Amer 73 (*)    GFR  calc Af Amer 85 (*)    All other components within normal limits  URINALYSIS, ROUTINE W REFLEX MICROSCOPIC - Abnormal; Notable for the following:    APPearance HAZY (*)    Protein, ur 30 (*)    All other components within normal limits  URINE MICROSCOPIC-ADD ON - Abnormal; Notable for the following:    Bacteria, UA FEW (*)    All other components within normal limits  I-STAT CHEM 8, ED - Abnormal; Notable for the following:    Glucose, Bld 151 (*)    Hemoglobin 15.6 (*)  All other components within normal limits  ETHANOL  PROTIME-INR  APTT  CBC  DIFFERENTIAL  URINE RAPID DRUG SCREEN (HOSP PERFORMED)  HEMOGLOBIN A1C  I-STAT TROPOININ, ED    Imaging Review Ct Head Wo Contrast  10/13/2014   CLINICAL DATA:  64 year old female who awoke at 1000 hours with left side weakness, left facial droop, eighth aphasia, headache. Initial encounter.  EXAM: CT HEAD WITHOUT CONTRAST  TECHNIQUE: Contiguous axial images were obtained from the base of the skull through the vertex without intravenous contrast.  COMPARISON:  [facility] non contrast head CT 11/17/2010.  FINDINGS: Visualized paranasal sinuses and mastoids are clear. Calcified atherosclerosis at the skull base. No acute osseous abnormality identified. Stable and negative orbit and scalp soft tissues.  Progressed Patchy and confluent bilateral cerebral white matter hypodensity,. Chronic appearing lacunar infarcts affecting the deep gray matter nuclei. That involving the anterior left thalamus appears to of been present in 2012. A large but chronic appearing left basal ganglia lacunar infarct is new since that time.  No superimposed acute cortically based infarct identified. No midline shift, mass effect, or evidence of intracranial mass lesion. No ventriculomegaly. No acute intracranial hemorrhage identified. No suspicious intracranial vascular hyperdensity.  IMPRESSION: Progressed chronic small vessel ischemia since 2012. No acute  cortically based infarct or acute intracranial hemorrhage identified.   Electronically Signed   By: Odessa Fleming M.D.   On: 10/13/2014 16:34     EKG Interpretation None      MDM   Final diagnoses:  CVA (cerebral infarction)  Cerebral infarction due to embolism of cerebral artery   64 yo with sudden onset left sided facial droop, and left side numbness and weakness this morning at 10 am.  CBC, CMP, troponin, PT-INR, PTT, EKG, CXR, and head CT.  Discussed case with Dr. Karma Ganja  Labs reviewed: Glucose: 151, no other significant abnormalities   CT resulted: No acute cortically based infarct or intracranial hemorrhage  4:55 PM: Consulted Dr. Butler Denmark (hospitalist) for admission and she will come eval. She requested neuro consult.  5:00PM: Consulted Dr. Thad Ranger (neuro) for evaluation of stroke symptoms.  The patient appears reasonably stabilized for admission considering the current resources, flow, and capabilities available in the ED at this time, and I doubt any further screening and/or treatment in the ED prior to admission.     Filed Vitals:   10/13/14 1500 10/13/14 1530  BP: 163/84 191/90  Pulse: 105 97  Temp: 98.8 F (37.1 C)   TempSrc: Oral   Resp: 20 16  SpO2: 92% 95%   Meds given in ED:  Medications - No data to display  New Prescriptions   No medications on file       Harle Battiest, NP 10/14/14 2108  Ethelda Chick, MD 10/16/14 [phone]

## 2014-10-13 NOTE — Progress Notes (Signed)
Pt received to unit at 18:20pm alert, verbal with no noted distress. Pt ambulated to bed from stretcher from the ED to have MRI done.

## 2014-10-13 NOTE — Consult Note (Addendum)
Referring Physician: Wynelle Cleveland    Chief Complaint: Left sided weakness and slurred speech  HPI: Dawn Ho is an 64 y.o. female who reports that for the past 3 weeks she has had symptoms of left sided numbness and weakness along with slurred speech, facial droop and occipital headache.  Although the symptoms have been intermittent she does not recall going more than 2 hours without symptoms during the past 3 weeks.  She has been unable to see her PCP due to the fact that she was changing doctors.   Although her symptoms have been present for the most part of the past three weeks this morning on awakening she and her family felt that they were worse.  Her speech was more slurred and her facial droop was more prominent.  The patient has also been unable to stay compliant with many of her medication due to being in between doctors.  Her symptoms have resolved somewhat since her arrival.    Date last known well: Unable to determine Time last known well: Unable to determine tPA Given: No: Unable to determine LKW  Past Medical History  Diagnosis Date  . Fibromyalgia   . Diabetes mellitus   . Hypertension   . Dyslipidemia   . TIA (transient ischemic attack)   . Depression with anxiety   . CVA (cerebral infarction)     Past Surgical History  Procedure Laterality Date  . Cholecystectomy    . Cesarean section      Family History  Problem Relation Age of Onset  . Heart disease Father    Social History:  reports that she has never smoked. She has never used smokeless tobacco. She reports that she does not drink alcohol or use illicit drugs.  Allergies:  Allergies  Allergen Reactions  . Ivp Dye [Iodinated Diagnostic Agents]   . Penicillins Swelling and Rash    Medications: I have reviewed the patient's current medications. Prior to Admission:  Current outpatient prescriptions:  .  acetaminophen (TYLENOL) 500 MG tablet, Take 500 mg by mouth every 6 (six) hours as needed.  , Disp: , Rfl:   .  aspirin EC 81 MG tablet, Take 81 mg by mouth daily.  , Disp: , Rfl:  .  cyanocobalamin (,VITAMIN B-12,) 1000 MCG/ML injection, Inject 1,000 mcg into the muscle every 30 (thirty) days.  , Disp: , Rfl:  .  ergocalciferol (VITAMIN D2) 50000 UNITS capsule, Take 50,000 Units by mouth once a week. Monday, Disp: , Rfl:  .  furosemide (LASIX) 40 MG tablet, Take 60 mg by mouth daily. , Disp: , Rfl:  .  gabapentin (NEURONTIN) 300 MG capsule, Take 300 mg by mouth 2 (two) times daily.  , Disp: , Rfl:  .  glimepiride (AMARYL) 2 MG tablet, Take 2 mg by mouth daily with breakfast., Disp: , Rfl:  .  glipiZIDE (GLUCOTROL XL) 10 MG 24 hr tablet, Take 10 mg by mouth daily.  , Disp: , Rfl:  .  insulin glargine (LANTUS) 100 UNIT/ML injection, Inject 62 Units into the skin at bedtime., Disp: , Rfl:  .  insulin regular (NOVOLIN R,HUMULIN R) 100 units/mL injection, Inject 12 Units into the skin 3 (three) times daily before meals. , Disp: , Rfl:  .  levothyroxine (SYNTHROID, LEVOTHROID) 125 MCG tablet, Take 125 mcg by mouth daily.  , Disp: , Rfl:  .  LORazepam (ATIVAN) 1 MG tablet, Take 1 mg by mouth 2 (two) times daily.  , Disp: , Rfl:  .  losartan (COZAAR) 100 MG tablet, Take 100 mg by mouth daily.  , Disp: , Rfl:  .  meloxicam (MOBIC) 7.5 MG tablet, Take 7.5 mg by mouth daily.  , Disp: , Rfl:  .  metoprolol (TOPROL-XL) 100 MG 24 hr tablet, Take 150 mg by mouth daily.  , Disp: , Rfl:  .  Multiple Vitamins-Minerals (MULTIVITAMIN WITH MINERALS) tablet, Take 1 tablet by mouth daily., Disp: , Rfl:  .  omeprazole (PRILOSEC) 20 MG capsule, Take 20 mg by mouth daily.  , Disp: , Rfl:  .  PARoxetine (PAXIL) 20 MG tablet, Take 20 mg by mouth every morning.  , Disp: , Rfl:  .  potassium chloride SA (K-DUR,KLOR-CON) 20 MEQ tablet, Take 20 mEq by mouth daily.  , Disp: , Rfl:   ROS: History obtained from the patient  General ROS: negative for - chills, fatigue, fever, night sweats, weight gain or weight loss Psychological  ROS: anxiety Ophthalmic ROS: negative for - blurry vision, double vision, eye pain or loss of vision ENT ROS: negative for - epistaxis, nasal discharge, oral lesions, sore throat, tinnitus or vertigo Allergy and Immunology ROS: negative for - hives or itchy/watery eyes Hematological and Lymphatic ROS: negative for - bleeding problems, bruising or swollen lymph nodes Endocrine ROS: negative for - galactorrhea, hair pattern changes, polydipsia/polyuria or temperature intolerance Respiratory ROS: negative for - cough, hemoptysis, shortness of breath or wheezing Cardiovascular ROS: palpitations and intermittent chest pain Gastrointestinal ROS: left abdominal pain Genito-Urinary ROS: negative for - dysuria, hematuria, incontinence or urinary frequency/urgency Musculoskeletal ROS: left forefinger pain from fall Neurological ROS: as noted in HPI Dermatological ROS: negative for rash and skin lesion changes  Physical Examination: Blood pressure 204/107, pulse 105, temperature 98.8 F (37.1 C), temperature source Oral, resp. rate 16, SpO2 96 %.  HEENT-  Normocephalic, no lesions, without obvious abnormality.  Normal external eye and conjunctiva.  Normal TM's bilaterally.  Normal auditory canals and external ears. Normal external nose, mucus membranes and septum.  Normal pharynx. Cardiovascular- S1, S2 normal, pulses palpable throughout   Lungs- chest clear, no wheezing, rales, normal symmetric air entry Abdomen- soft, non-tender; bowel sounds normal; no masses,  no organomegaly Extremities- no edema Lymph-no adenopathy palpable Musculoskeletal-no joint tenderness, deformity or swelling Skin-warm and dry, no hyperpigmentation, vitiligo, or suspicious lesions  Neurological Examination Mental Status: Alert, oriented, thought content appropriate for most of the conversation but at times will say things like her mother's "kidneys disappeared."  Speech fluent without evidence of aphasia with some  slurring noted intermittently.  Able to follow 3 step commands without difficulty. Cranial Nerves: II: Discs flat bilaterally; Visual fields grossly normal, pupils equal, round, reactive to light and accommodation III,IV, VI: ptosis not present, extra-ocular motions intact bilaterally V,VII: smile symmetric and when not evaluating patient no facial droop noted.  With formal testing mouth is drawn with mild left facial droop noted, facial light touch sensation normal bilaterally VIII: hearing normal bilaterally IX,X: gag reflex present XI: bilateral shoulder shrug XII: midline tongue extension Motor: Right : Upper extremity   5/5    Left:     Upper extremity   5/5  Lower extremity   5/5     Lower extremity   5/5 Tone and bulk:normal tone throughout; no atrophy noted Sensory: Pinprick and light touch intact throughout, bilaterally Deep Tendon Reflexes: 2+ and symmetric throughout with absent AJ's bilaterally Plantars: Right: mute   Left: downgoing Cerebellar: normal finger-to-nose and normal heel-to-shin testing bilaterally Gait: not tested due  to safety concerns    Laboratory Studies:  Basic Metabolic Panel:  Recent Labs Lab 10/13/14 1532  NA 142  K 3.7  CL 101  GLUCOSE 151*  BUN 18  CREATININE 0.90    Liver Function Tests: No results for input(s): AST, ALT, ALKPHOS, BILITOT, PROT, ALBUMIN in the last 168 hours. No results for input(s): LIPASE, AMYLASE in the last 168 hours. No results for input(s): AMMONIA in the last 168 hours.  CBC:  Recent Labs Lab 10/13/14 1500 10/13/14 1532  WBC 9.0  --   NEUTROABS 5.3  --   HGB 14.2 15.6*  HCT 42.8 46.0  MCV 93.2  --   PLT 197  --     Cardiac Enzymes: No results for input(s): CKTOTAL, CKMB, CKMBINDEX, TROPONINI in the last 168 hours.  BNP: Invalid input(s): POCBNP  CBG: No results for input(s): GLUCAP in the last 168 hours.  Microbiology: Results for orders placed or performed during the hospital encounter of  01/07/08  Urine culture     Status: None   Collection Time: 01/07/08  9:06 PM  Result Value Ref Range Status   Specimen Description URINE, RANDOM  Final   Special Requests IMMUNE:NORM UT SYMPT:NEG  Final   Colony Count >=100,000 COLONIES/ML  Final   Culture   Final    Multiple bacterial morphotypes present, none predominant. Suggest appropriate recollection if clinically indicated.   Report Status 01/09/2008 FINAL  Final  Clostridium difficile EIA     Status: None   Collection Time: 01/07/08  9:10 PM  Result Value Ref Range Status   Specimen Description STOOL  Final   Special Requests IMMUNE:NORM  Final   C difficile Toxins A+B, EIA NEGATIVE  Final   Report Status 01/08/2008 FINAL  Final    Coagulation Studies:  Recent Labs  10/13/14 1500  LABPROT 12.4  INR 0.92    Urinalysis:  Recent Labs Lab 10/13/14 1500  COLORURINE YELLOW  LABSPEC 1.012  PHURINE 8.0  GLUCOSEU NEGATIVE  HGBUR NEGATIVE  BILIRUBINUR NEGATIVE  KETONESUR NEGATIVE  PROTEINUR 30*  UROBILINOGEN 0.2  NITRITE NEGATIVE  LEUKOCYTESUR NEGATIVE    Lipid Panel: No results found for: CHOL, TRIG, HDL, CHOLHDL, VLDL, LDLCALC  HgbA1C: No results found for: HGBA1C  Urine Drug Screen:  No results found for: LABOPIA, COCAINSCRNUR, LABBENZ, AMPHETMU, THCU, LABBARB  Alcohol Level: No results for input(s): ETH in the last 168 hours.  Other results: EKG: sinus tachycardia at 104 bpm.  Imaging: Ct Head Wo Contrast  10/13/2014   CLINICAL DATA:  64 year old female who awoke at 1000 hours with left side weakness, left facial droop, eighth aphasia, headache. Initial encounter.  EXAM: CT HEAD WITHOUT CONTRAST  TECHNIQUE: Contiguous axial images were obtained from the base of the skull through the vertex without intravenous contrast.  COMPARISON:  [facility] non contrast head CT 11/17/2010.  FINDINGS: Visualized paranasal sinuses and mastoids are clear. Calcified atherosclerosis at the skull base. No  acute osseous abnormality identified. Stable and negative orbit and scalp soft tissues.  Progressed Patchy and confluent bilateral cerebral white matter hypodensity,. Chronic appearing lacunar infarcts affecting the deep gray matter nuclei. That involving the anterior left thalamus appears to of been present in 2012. A large but chronic appearing left basal ganglia lacunar infarct is new since that time.  No superimposed acute cortically based infarct identified. No midline shift, mass effect, or evidence of intracranial mass lesion. No ventriculomegaly. No acute intracranial hemorrhage identified. No suspicious intracranial vascular hyperdensity.  IMPRESSION:  Progressed chronic small vessel ischemia since 2012. No acute cortically based infarct or acute intracranial hemorrhage identified.   Electronically Signed   By: Genevie Ann M.D.   On: 10/13/2014 16:34    Assessment: 64 y.o. female presenting with a three week history of left sided weakness and numbness, along with facial droop, slurred speech and headache.  Patient now improved.  Head CT reviewed ans shows no acute changes.  Etiology unclear.  Although ischemic disease is on the differential, it would not be acute.  Patient on ASA at home.  BP quite elevated.  Further work up recommended.  Medications to be restarted.    Stroke Risk Factors - diabetes mellitus, hyperlipidemia and hypertension  Plan: 1. HgbA1c, fasting lipid panel, ESR, CRP 2. MRI, MRA  of the brain without contrast 3. PT consult, OT consult, Speech consult 4. Echocardiogram 5. Carotid dopplers 6. Prophylactic therapy-Antiplatelet med: Aspirin - dose 330m daily 7. NPO until RN stroke swallow screen 8. Telemetry monitoring 9. Frequent neuro checks 10. BP control   LAlexis Goodell MD [facility] [id number]/24/2016, 6:09 PM

## 2014-10-13 NOTE — H&P (Signed)
[facility] History and Physical  Dawn Ho ZOX:[id number] DOB: 11-27-1950 DOA: 10/13/2014   PCP: Colon Branch, MD    Chief Complaint: Headache, slurred speech, facial droop  HPI: Dawn Ho is a 64 y.o. 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 She woke up this morning with a posterior headache. Her husband noted that she was slurring her words and she had a left-sided facial droop. He does state that this has been on and off for about 3 weeks now but this morning it just didn't resolve and therefore he brought her into the ER. In the ER the symptoms persisted and I was called to do her admission for CVA. By the time I walked into the room the family states her symptoms had resolved. She has been taking a baby aspirin every day that has not had her Lipitor. She and admits to the fact that she has run out of some of her medications including Toprol Prilosec and Lipitor. She is in between doctors and because it took so long to transfer records from one doctor to another doctor and the fact that she has not been able to see the new doctor yet, neither of them aren't willing to prescribe her medicines for her.   General: The patient denies anorexia, fever, weight loss Cardiac: She admits to having intermittent palpitations associated with chest pain ever since she's been off the Toprol, denies syncope,  pedal edema  Respiratory: Denies cough, shortness of breath, wheezing GI: Denies severe indigestion/heartburn, abdominal pain, nausea, vomiting, diarrhea and constipation GU: Denies hematuria, incontinence, dysuria  Musculoskeletal: Denies arthritis  Skin: Denies suspicious skin lesions Neurologic: The history of present illness Psychiatry: Denies depression- Misty feeling anxious while she is home alone when her husband is at  work. Hematologic: no easy bruising or bleeding   Past Medical History  Diagnosis Date  . Fibromyalgia   . Diabetes mellitus   . Hypertension   . Dyslipidemia   . TIA (transient ischemic attack)   . Depression with anxiety   . CVA (cerebral infarction)     Past Surgical History  Procedure Laterality Date  . Cholecystectomy    . Cesarean section      Social History: does not smoke or drink alcohol Lives at home with husband   Allergies  Allergen Reactions  . Ivp Dye [Iodinated Diagnostic Agents]   . Penicillins Swelling and Rash    Family History  Problem Relation Age of Onset  . Heart disease Father      Prior to Admission medications   Medication Sig Start Date End Date Taking? Authorizing Provider  acetaminophen (TYLENOL) 500 MG tablet Take 500 mg by mouth every 6 (six) hours as needed.     Yes Historical Provider, MD  aspirin EC 81 MG tablet Take 81 mg by mouth daily.     Yes Historical Provider, MD  cyanocobalamin (,VITAMIN B-12,) 1000 MCG/ML injection Inject 1,000 mcg into the muscle every 30 (thirty) days.     Yes Historical Provider, MD  ergocalciferol (VITAMIN D2) 50000 UNITS capsule Take 50,000 Units by mouth once a week. Monday   Yes Historical Provider, MD  furosemide (LASIX) 40 MG tablet Take 60 mg by mouth daily.    Yes Historical Provider, MD  gabapentin (NEURONTIN) 300 MG capsule Take 300 mg by mouth 2 (two) times daily.     Yes  Historical Provider, MD  glimepiride (AMARYL) 2 MG tablet Take 2 mg by mouth daily with breakfast.   Yes Historical Provider, MD  glipiZIDE (GLUCOTROL XL) 10 MG 24 hr tablet Take 10 mg by mouth daily.     Yes Historical Provider, MD  insulin glargine (LANTUS) 100 UNIT/ML injection Inject 62 Units into the skin at bedtime.   Yes Historical Provider, MD  insulin regular (NOVOLIN R,HUMULIN R) 100 units/mL injection Inject 12 Units into the skin 3 (three) times daily before meals.    Yes Historical Provider, MD  levothyroxine  (SYNTHROID, LEVOTHROID) 125 MCG tablet Take 125 mcg by mouth daily.     Yes Historical Provider, MD  LORazepam (ATIVAN) 1 MG tablet Take 1 mg by mouth 2 (two) times daily.     Yes Historical Provider, MD  losartan (COZAAR) 100 MG tablet Take 100 mg by mouth daily.     Yes Historical Provider, MD  meloxicam (MOBIC) 7.5 MG tablet Take 7.5 mg by mouth daily.     Yes Historical Provider, MD  metoprolol (TOPROL-XL) 100 MG 24 hr tablet Take 150 mg by mouth daily.     Yes Historical Provider, MD  Multiple Vitamins-Minerals (MULTIVITAMIN WITH MINERALS) tablet Take 1 tablet by mouth daily.   Yes Historical Provider, MD  omeprazole (PRILOSEC) 20 MG capsule Take 20 mg by mouth daily.     Yes Historical Provider, MD  PARoxetine (PAXIL) 20 MG tablet Take 20 mg by mouth every morning.     Yes Historical Provider, MD  potassium chloride SA (K-DUR,KLOR-CON) 20 MEQ tablet Take 20 mEq by mouth daily.     Yes Historical Provider, MD     Physical Exam: Filed Vitals:   10/13/14 1500 10/13/14 1530  BP: 163/84 191/90  Pulse: 105 97  Temp: 98.8 F (37.1 C)   TempSrc: Oral   Resp: 20 16  SpO2: 92% 95%     General: Awake alert oriented 3- obese female laying in bed in no acute distress HEENT: Normocephalic and Atraumatic, Mucous membranes pink                PERRLA; EOM intact; No scleral icterus,                 Nares: Patent, Oropharynx: Clear, Fair Dentition                 Neck: FROM, no cervical lymphadenopathy, thyromegaly, carotid bruit or JVD;  Breasts: deferred CHEST WALL: No tenderness  CHEST: Normal respiration, clear to auscultation bilaterally  HEART: Regular rate and rhythm; no murmurs rubs or gallops  BACK: No kyphosis or scoliosis; no CVA tenderness  GI: Positive Bowel Sounds, soft, non-tender; no masses, no organomegaly Rectal Exam: deferred MSK: No cyanosis, clubbing, or edema Genitalia: not examined  SKIN:  no rash or ulceration  CNS: Alert and Oriented x 4, 4 over 5 weakness in  left arm and leg, mild left-sided facial droop other cranial nerves intact  Labs on Admission:  Basic Metabolic Panel:  Recent Labs Lab 10/13/14 1532  NA 142  K 3.7  CL 101  GLUCOSE 151*  BUN 18  CREATININE 0.90   Liver Function Tests: No results for input(s): AST, ALT, ALKPHOS, BILITOT, PROT, ALBUMIN in the last 168 hours. No results for input(s): LIPASE, AMYLASE in the last 168 hours. No results for input(s): AMMONIA in the last 168 hours. CBC:  Recent Labs Lab 10/13/14 1500 10/13/14 1532  WBC 9.0  --   NEUTROABS 5.3  --  HGB 14.2 15.6*  HCT 42.8 46.0  MCV 93.2  --   PLT 197  --    Cardiac Enzymes: No results for input(s): CKTOTAL, CKMB, CKMBINDEX, TROPONINI in the last 168 hours.  BNP (last 3 results) No results for input(s): BNP in the last 8760 hours.  ProBNP (last 3 results) No results for input(s): PROBNP in the last 8760 hours.  CBG: No results for input(s): GLUCAP in the last 168 hours.  Radiological Exams on Admission: Ct Head Wo Contrast  10/13/2014   CLINICAL DATA:  64 year old female who awoke at 1000 hours with left side weakness, left facial droop, eighth aphasia, headache. Initial encounter.  EXAM: CT HEAD WITHOUT CONTRAST  TECHNIQUE: Contiguous axial images were obtained from the base of the skull through the vertex without intravenous contrast.  COMPARISON:  [facility] non contrast head CT 11/17/2010.  FINDINGS: Visualized paranasal sinuses and mastoids are clear. Calcified atherosclerosis at the skull base. No acute osseous abnormality identified. Stable and negative orbit and scalp soft tissues.  Progressed Patchy and confluent bilateral cerebral white matter hypodensity,. Chronic appearing lacunar infarcts affecting the deep gray matter nuclei. That involving the anterior left thalamus appears to of been present in 2012. A large but chronic appearing left basal ganglia lacunar infarct is new since that time.  No superimposed acute  cortically based infarct identified. No midline shift, mass effect, or evidence of intracranial mass lesion. No ventriculomegaly. No acute intracranial hemorrhage identified. No suspicious intracranial vascular hyperdensity.  IMPRESSION: Progressed chronic small vessel ischemia since 2012. No acute cortically based infarct or acute intracranial hemorrhage identified.   Electronically Signed   By: Odessa Fleming M.D.   On: 10/13/2014 16:34    EKG: Independently reviewed. Sinus tachycardia at 104 bpm   Assessment/Plan Principal Problem:   TIA versus CVA  - We'll obtain MRI/MRA- I have ordered carotid Dopplers and 2-D echo but if MRI is negative for CVA, these should be canceled -Resume her Lipitor and change baby aspirin to full dose aspirin -PT OT and SLP eval -Neuro consult has been requested  Active Problems:    Diabetes mellitus -Resume insulin but cut doses back slightly -as she takes fixed mealtime insulin, I will not be starting a sliding scale for her -check A1c  Hypertension, chest pain, palpitations Resume Toprol-    Depression with anxiety - Continue Ativan and Paxil    Obesity    Hyperlipidemia -Resume Lipitor  Note: Patient will need prescriptions for all medications prior to discharge she currently is in between physicians  Consulted: Neurology  Code Status: Full code  Family Communication: Husband DVT Prophylaxis: Lovenox  Time spent: 50 minutes Mendi Constable, MD [facility]  If 7PM-7AM, please contact night-coverage [email or web address] 10/13/2014, 5:29 PM

## 2014-10-13 NOTE — ED Notes (Signed)
Attempted to call report to floor.  Nurse is unavailable and will call back. 

## 2014-10-13 NOTE — Progress Notes (Signed)
Radiologist called this NP to relay MRI results which include multiple small infarcts, subacute, which would be consistent with pt's presenting symptoms. This info was relayed to Dr. Cyril Mourningamillo of [facility]. Dr. Cyril Mourningamillo reviewed MRI and agreed with continuing workup and full strength ASA. Stroke team will see pt in am.  Jimmye NormanKaren Kirby-Graham, NP [facility]

## 2014-10-13 NOTE — ED Notes (Signed)
Neuro at bedside at this time.

## 2014-10-14 ENCOUNTER — Encounter (HOSPITAL_COMMUNITY): Payer: Self-pay | Admitting: General Practice

## 2014-10-14 ENCOUNTER — Encounter: Payer: Self-pay | Admitting: Physician Assistant

## 2014-10-14 DIAGNOSIS — I639 Cerebral infarction, unspecified: Secondary | ICD-10-CM

## 2014-10-14 DIAGNOSIS — I480 Paroxysmal atrial fibrillation: Secondary | ICD-10-CM

## 2014-10-14 DIAGNOSIS — I6310 Cerebral infarction due to embolism of unspecified precerebral artery: Secondary | ICD-10-CM

## 2014-10-14 DIAGNOSIS — E785 Hyperlipidemia, unspecified: Secondary | ICD-10-CM

## 2014-10-14 DIAGNOSIS — E08329 Diabetes mellitus due to underlying condition with mild nonproliferative diabetic retinopathy without macular edema: Secondary | ICD-10-CM

## 2014-10-14 LAB — LIPID PANEL
Cholesterol: 207 mg/dL — ABNORMAL HIGH (ref 0–200)
HDL: 42 mg/dL (ref >39)
LDL CALC: 93 mg/dL (ref 0–99)
Total CHOL/HDL Ratio: 4.9 RATIO
Triglycerides: 358 mg/dL — ABNORMAL HIGH (ref <150)
VLDL: 72 mg/dL — ABNORMAL HIGH (ref 0–40)

## 2014-10-14 LAB — GLUCOSE, CAPILLARY
GLUCOSE-CAPILLARY: 209 mg/dL — AB (ref 70–99)
Glucose-Capillary: 142 mg/dL — ABNORMAL HIGH (ref 70–99)
Glucose-Capillary: 211 mg/dL — ABNORMAL HIGH (ref 70–99)
Glucose-Capillary: 183 mg/dL — ABNORMAL HIGH (ref 70–99)

## 2014-10-14 MED ORDER — PNEUMOCOCCAL VAC POLYVALENT 25 MCG/0.5ML IJ INJ
0.5000 mL | INJECTION | INTRAMUSCULAR | Status: AC
  Administered 2014-10-15: 0.5 mL via INTRAMUSCULAR
  Filled 2014-10-14: qty 0.5

## 2014-10-14 NOTE — Progress Notes (Signed)
VASCULAR LAB PRELIMINARY  PRELIMINARY  PRELIMINARY  PRELIMINARY  Carotid Dopplers completed.    Preliminary report:  1-39% ICA stenosis.  Vertebral artery flow is antegrade.  Kden Wagster, RVT 10/14/2014, 3:51 PM

## 2014-10-14 NOTE — Progress Notes (Signed)
PT Cancellation Note  Patient Details Name: Dawn Ho MRN: [id number] DOB: [birth year]   Cancelled Treatment:    Reason Eval/Treat Not Completed: Patient at procedure or test/unavailable. Pt in vascular; out of room   [name] 10/14/2014, 3:31 PM  Pager [phone]

## 2014-10-14 NOTE — Progress Notes (Signed)
STROKE TEAM PROGRESS NOTE   HISTORY Dawn Ho is an 64 y.o. female who reports that for the past 3 weeks she has had symptoms of left sided numbness and weakness along with slurred speech, facial droop and occipital headache. Although the symptoms have been intermittent she does not recall going more than 2 hours without symptoms during the past 3 weeks. She has been unable to see her PCP due to the fact that she was changing doctors. Although her symptoms have been present for the most part of the past three weeks this morning on awakening she and her family felt that they were worse. Her speech was more slurred and her facial droop was more prominent. The patient has also been unable to stay compliant with many of her medication due to being in between doctors. Her symptoms have resolved somewhat since her arrival.   Her time last known well was unknown. Patient was not administered TPA secondary to delay in arrival. She was admitted for further evaluation and treatment.   SUBJECTIVE (INTERVAL HISTORY) Her husband is at the bedside.  Overall she feels her condition is stable.    OBJECTIVE Temp:  [97.8 F (36.6 C)-98.8 F (37.1 C)] 98.1 F (36.7 C) (02/25 1001) Pulse Rate:  [56-105] 62 (02/25 1001) Cardiac Rhythm:  [-] Normal sinus rhythm (02/24 1954) Resp:  [15-20] 16 (02/25 1001) BP: (156-204)/(69-107) 158/79 mmHg (02/25 1001) SpO2:  [92 %-100 %] 95 % (02/25 1001) Weight:  [103.919 kg (229 lb 1.6 oz)] 103.919 kg (229 lb 1.6 oz) (02/24 1930)   Recent Labs Lab 10/13/14 2047 10/14/14 0644  GLUCAP 183* 142*    Recent Labs Lab 10/13/14 1500 10/13/14 1532  NA 142 142  K 3.8 3.7  CL 102 101  CO2 28  --   GLUCOSE 148* 151*  BUN 16 18  CREATININE 0.83 0.90  CALCIUM 10.4  --     Recent Labs Lab 10/13/14 1500  AST 42*  ALT 27  ALKPHOS 89  BILITOT 1.0  PROT 7.3  ALBUMIN 3.9    Recent Labs Lab 10/13/14 1500 10/13/14 1532  WBC 9.0  --   NEUTROABS 5.3  --    HGB 14.2 15.6*  HCT 42.8 46.0  MCV 93.2  --   PLT 197  --    No results for input(s): CKTOTAL, CKMB, CKMBINDEX, TROPONINI in the last 168 hours.  Recent Labs  10/13/14 1500  LABPROT 12.4  INR 0.92    Recent Labs  10/13/14 1500  COLORURINE YELLOW  LABSPEC 1.012  PHURINE 8.0  GLUCOSEU NEGATIVE  HGBUR NEGATIVE  BILIRUBINUR NEGATIVE  KETONESUR NEGATIVE  PROTEINUR 30*  UROBILINOGEN 0.2  NITRITE NEGATIVE  LEUKOCYTESUR NEGATIVE       Component Value Date/Time   CHOL 207* 10/14/2014 0419   TRIG 358* 10/14/2014 0419   HDL 42 10/14/2014 0419   CHOLHDL 4.9 10/14/2014 0419   VLDL 72* 10/14/2014 0419   LDLCALC 93 10/14/2014 0419   No results found for: HGBA1C    Component Value Date/Time   LABOPIA NONE DETECTED 10/13/2014 1500   COCAINSCRNUR NONE DETECTED 10/13/2014 1500   LABBENZ NONE DETECTED 10/13/2014 1500   AMPHETMU NONE DETECTED 10/13/2014 1500   THCU NONE DETECTED 10/13/2014 1500   LABBARB NONE DETECTED 10/13/2014 1500     Recent Labs Lab 10/13/14 1943  ETH <5    Ct Head Wo Contrast  10/13/2014   CLINICAL DATA:  63 year old female who awoke at 1000 hours with left side weakness,  left facial droop, eighth aphasia, headache. Initial encounter.  EXAM: CT HEAD WITHOUT CONTRAST  TECHNIQUE: Contiguous axial images were obtained from the base of the skull through the vertex without intravenous contrast.  COMPARISON:  [facility] non contrast head CT 11/17/2010.  FINDINGS: Visualized paranasal sinuses and mastoids are clear. Calcified atherosclerosis at the skull base. No acute osseous abnormality identified. Stable and negative orbit and scalp soft tissues.  Progressed Patchy and confluent bilateral cerebral white matter hypodensity,. Chronic appearing lacunar infarcts affecting the deep gray matter nuclei. That involving the anterior left thalamus appears to of been present in 2012. A large but chronic appearing left basal ganglia lacunar infarct is new  since that time.  No superimposed acute cortically based infarct identified. No midline shift, mass effect, or evidence of intracranial mass lesion. No ventriculomegaly. No acute intracranial hemorrhage identified. No suspicious intracranial vascular hyperdensity.  IMPRESSION: Progressed chronic small vessel ischemia since 2012. No acute cortically based infarct or acute intracranial hemorrhage identified.   Electronically Signed   By: Odessa Fleming M.D.   On: 10/13/2014 16:34   Mr [name] Head Wo Contrast  10/13/2014   CLINICAL DATA:  Left-sided weakness and slurred speech. Symptoms for 3 weeks. Facial droop and occipital headache.  EXAM: MRI HEAD WITHOUT CONTRAST  MRA HEAD WITHOUT CONTRAST  TECHNIQUE: Multiplanar, multiecho pulse sequences of the brain and surrounding structures were obtained without intravenous contrast. Angiographic images of the head were obtained using MRA technique without contrast.  COMPARISON:  CT head without contrast 10/13/2014.  FINDINGS: MRI HEAD FINDINGS  At least 9 separate foci of restricted diffusion are present throughout both cerebral hemispheres. A lesion in the superior temporal gyrus demonstrates distinctly restricted diffusion. A lesion in the anterior right frontal lobe is more indistinct. Other lesions are along this spectrum, suggesting infarcts of the areas acute/subacute age over the last 1-2 weeks. The largest focus of lesions is in the posterior right frontal lobe. There are lesions in the posterior left frontal lobe white matter as well. There is no associated hemorrhage. T2 changes are associated.  A remote infarct involves the anterior portion of the left lentiform nucleus. There are remote lacunar infarcts in the thalami bilaterally. Remote white matter lacunar infarcts are present adjacent to the atrium of the lateral ventricles bilaterally. Confluent periventricular and subcortical white matter changes are evident otherwise as well.  There is no acute hemorrhage or mass  lesion. Ex vacuo dilation is noted in the left lateral ventricle associated with the remote basal ganglia infarcts.  Flow is present in the major intracranial arteries. The right lens is replaced. The globes and orbits are otherwise intact. The paranasal sinuses are clear. There is minimal fluid in the left mastoid air cells. No obstructing nasopharyngeal lesion is evident.  MRA HEAD FINDINGS  Mild atherosclerotic irregularity is present within the cavernous internal carotid arteries bilaterally. The study is mildly degraded by motion. There is mild narrowing of proximal right A1 segment. The A1 and M1 segments are otherwise normal. The anterior communicating artery is patent. A high-grade stenosis is present in the mass posterior right M2 segment and the most anterior right left M2 segment. There is marked attenuation of MCA branch vessels bilaterally.  The vertebral arteries are codominant. There is moderate stenosis of the proximal right PICA. The left PICA origin is visualized and normal. The basilar artery is normal. Both posterior cerebral arteries originate from the basilar tip. Moderate attenuation of PCA branch vessels is present bilaterally, worse on  the left. There is a mild distal right P2 segment stenosis.  IMPRESSION: 1. Multiple punctate acute/subacute infarcts of varying ages compatible with a history of symptoms over the last 3 weeks. The largest cluster of infarcts is in the posterior right frontal lobe, compatible with left-sided numbness and weakness. 2. Other more remote infarcts are present within the left greater than right basal ganglia and thalami as well as within the deep white matter. 3. Extensive small vessel white matter changes are present bilaterally. 4. Extensive branch vessel stenoses and small vessel disease in both the anterior and posterior circulation. 5. Prominent proximal stenoses in M2 branches bilaterally. 6. No significant stenosis of the medium or large intracranial  vessels proximal to the MCA bifurcations. These results were called by telephone at the time of interpretation on 10/13/2014 at 840 pm: To Georga Hacking, RN, who verbally acknowledged these results.   Electronically Signed   By: Marin Roberts M.D.   On: 10/13/2014 20:40   Mr Brain Wo Contrast  10/13/2014   CLINICAL DATA:  Left-sided weakness and slurred speech. Symptoms for 3 weeks. Facial droop and occipital headache.  EXAM: MRI HEAD WITHOUT CONTRAST  MRA HEAD WITHOUT CONTRAST  TECHNIQUE: Multiplanar, multiecho pulse sequences of the brain and surrounding structures were obtained without intravenous contrast. Angiographic images of the head were obtained using MRA technique without contrast.  COMPARISON:  CT head without contrast 10/13/2014.  FINDINGS: MRI HEAD FINDINGS  At least 9 separate foci of restricted diffusion are present throughout both cerebral hemispheres. A lesion in the superior temporal gyrus demonstrates distinctly restricted diffusion. A lesion in the anterior right frontal lobe is more indistinct. Other lesions are along this spectrum, suggesting infarcts of the areas acute/subacute age over the last 1-2 weeks. The largest focus of lesions is in the posterior right frontal lobe. There are lesions in the posterior left frontal lobe white matter as well. There is no associated hemorrhage. T2 changes are associated.  A remote infarct involves the anterior portion of the left lentiform nucleus. There are remote lacunar infarcts in the thalami bilaterally. Remote white matter lacunar infarcts are present adjacent to the atrium of the lateral ventricles bilaterally. Confluent periventricular and subcortical white matter changes are evident otherwise as well.  There is no acute hemorrhage or mass lesion. Ex vacuo dilation is noted in the left lateral ventricle associated with the remote basal ganglia infarcts.  Flow is present in the major intracranial arteries. The right lens is replaced. The  globes and orbits are otherwise intact. The paranasal sinuses are clear. There is minimal fluid in the left mastoid air cells. No obstructing nasopharyngeal lesion is evident.  MRA HEAD FINDINGS  Mild atherosclerotic irregularity is present within the cavernous internal carotid arteries bilaterally. The study is mildly degraded by motion. There is mild narrowing of proximal right A1 segment. The A1 and M1 segments are otherwise normal. The anterior communicating artery is patent. A high-grade stenosis is present in the mass posterior right M2 segment and the most anterior right left M2 segment. There is marked attenuation of MCA branch vessels bilaterally.  The vertebral arteries are codominant. There is moderate stenosis of the proximal right PICA. The left PICA origin is visualized and normal. The basilar artery is normal. Both posterior cerebral arteries originate from the basilar tip. Moderate attenuation of PCA branch vessels is present bilaterally, worse on the left. There is a mild distal right P2 segment stenosis.  IMPRESSION: 1. Multiple punctate acute/subacute infarcts of varying ages  compatible with a history of symptoms over the last 3 weeks. The largest cluster of infarcts is in the posterior right frontal lobe, compatible with left-sided numbness and weakness. 2. Other more remote infarcts are present within the left greater than right basal ganglia and thalami as well as within the deep white matter. 3. Extensive small vessel white matter changes are present bilaterally. 4. Extensive branch vessel stenoses and small vessel disease in both the anterior and posterior circulation. 5. Prominent proximal stenoses in M2 branches bilaterally. 6. No significant stenosis of the medium or large intracranial vessels proximal to the MCA bifurcations. These results were called by telephone at the time of interpretation on 10/13/2014 at 840 pm: To Georga Hacking, RN, who verbally acknowledged these results.    Electronically Signed   By: Marin Roberts M.D.   On: 10/13/2014 20:40    PHYSICAL EXAM Pleasant middle-aged African-American lady currently not in distress. . Afebrile. Head is nontraumatic. Neck is supple without bruit.    Cardiac exam no murmur or gallop. Lungs are clear to auscultation. Distal pulses are well felt. Neurological Exam : Awake alert oriented x 3 normal speech and language. Mild left lower face asymmetry. Tongue midline. Mild LUE and LLE drift. Mild diminished fine finger movements on left. Orbits right over left upper extremity. Mild left grip weak.. Normal sensation . Normal coordination.  ASSESSMENT/PLAN Dawn Ho is a 64 y.o. 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 She did not receive IV t-PA due to delay in arrival.   Stroke:  bilateral punctate acute/subacute infarcts,  Embolic secondary to unknown source  Resultant  Dysarthria, facial weakness  MRI  Multiple bilateral punctate acute/subacute infarcts, largest cluster in the posterior right frontal lobe. Old left greater than right basal ganglia and thalami and deep white matter infarcts. Extensive bilateral small vessel white matter changes   MRA  Extensive anterior and posterior branch vessel stenoses and small vessel disease. proximal B M2 stenoses   Carotid Doppler  pending   2D Echo  pending   Lovenox 40 mg sq daily for VTE prophylaxis  Diet heart healthy/carb modified thin liquids  aspirin 81 mg orally every day prior to admission, now on aspirin 325 mg orally every day  Patient counseled to be compliant with her antithrombotic medications  Ongoing aggressive stroke risk factor management  Pt reports a ? Hx of atrial fibrillation, seen by cardiology in 2012 without mention of atrial fibrillation. Recommend cardiology consult to evaluate  possible history of atrial fibrillation vs TEE/loop recorder placement this admission. Will discuss with Dr. Sharl Ma   Therapy recommendations:  pending   Disposition:  Pending. Likely rehab  Hypertension, malignant  BP 204/107  Unstable  Hyperlipidemia  Home meds:  No statin  LDL 93, goal < 70  Add statin    Continue statin at discharge  Diabetes  HgbA1c pending, goal < 7.0  Other Stroke Risk Factors  Morbid Obesity, Body mass index is 44.74 kg/(m^2).   Hx stroke/TIA   Hospital day # 1  Rhoderick Moody [facility] See Amion for Pager information 10/14/2014 10:20 AM  I have personally examined this patient, reviewed notes, independently viewed imaging studies, participated in medical decision making and plan of care. I have made any additions or clarifications directly to the above note. Agree with note above. Patient has bi-cerebral embolic infarcts of unknown source.  She is at significant risk for recurrent strokes and neurological worsening and needs ongoing stroke evaluation and aggressive risk factor modification. Recommend cardiology consult to verify history of atrial fibrillation and if negative check TEE and loop recorder implant. Discussed with patient and family and answered questions  Delia HeadyPramod Phenix Grein, MD Medical Director [facility] Pager: [phone] 10/14/2014 1:23 PM    To contact Stroke Continuity provider, please refer to [email or web address]. After hours, contact General Neurology

## 2014-10-14 NOTE — Progress Notes (Signed)
Pt ambulated with walker standby assist to the bathroom and to the chair to sit up to eat breakfast. Pt denies pain or discomfort. No noted distress. Safety measures in place. Call bell within reach.

## 2014-10-14 NOTE — Consult Note (Signed)
CARDIOLOGY CONSULT NOTE   Patient ID: Dawn Ho MRN: [id number] DOB/AGE: 1950/12/14 64 y.o.  Admit date: 10/13/2014  Primary Physician   No PCP Per Patient Primary Cardiologist   New (seen by Dr. Gala Romney in the past)  Reason for Consultation   Possible Afib and CVA  HPI: Dawn Ho is a 64 y.o. 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 MRI showed multiple punctate acute/subacute infarcts of varying ages. Largest cluster of infarcts in the posterior right frontal lobe compatible with left-sided numbness and weakness. Family reported a possible remote hx of afib and cardiology was consulted on TEE vs just starting [facility].  She has never followed with a cardiologist. Patient has been seen by Dr. Gala Romney in 2009 for an admission for chest pain. She ruled out and was referred for outpatient stress test. She also complained of chest pain again in 2012 and was referred by Dr. Earnestine Leys for nuclear stress test, but it does not look like this was ever completed. No prior cardiac history otherwise. Family in room says they remember something about an irregular heart beat diagnosis in the 1980s. The patient/family reports that she often gets chest pain, palpitations and SOB but only when she gets emotionally upset about something. No exertional CP or SOB but she is not very active. Patient is a poor historian and answers yes to most questions. Family is very helpful in filling in details.      Past Medical History  Diagnosis Date  . Fibromyalgia   . Diabetes mellitus   . Hypertension   . Dyslipidemia   . TIA (transient ischemic attack)   . Depression with anxiety   . CVA (cerebral infarction)      Past Surgical History  Procedure Laterality Date  . Cholecystectomy    . Cesarean section       Allergies  Allergen Reactions  . Ivp Dye [Iodinated Diagnostic Agents]   . Penicillins Swelling and Rash    I have reviewed the patient's current medications . aspirin  325 mg Oral Daily  . enoxaparin (LOVENOX) injection  40 mg Subcutaneous Q24H  . furosemide  60 mg Oral Daily  . gabapentin  300 mg Oral BID  . insulin aspart  8 Units Subcutaneous TID WC  . insulin glargine  55 Units Subcutaneous QHS  . levothyroxine  125 mcg Oral QAC breakfast  . LORazepam  1 mg Oral BID  . losartan  100 mg Oral Daily  . meloxicam  7.5 mg Oral Daily  . metoprolol succinate  150 mg Oral Daily  . pantoprazole  40 mg Oral Daily  . PARoxetine  20 mg Oral Daily  . [START ON 10/15/2014] pneumococcal 23 valent vaccine  0.5 mL Intramuscular Tomorrow-1000  . potassium chloride  20 mEq Oral Daily     hydrALAZINE, HYDROcodone-acetaminophen, senna-docusate, traMADol  Prior to Admission medications   Medication Sig Start Date End Date Taking? Authorizing Provider  acetaminophen (TYLENOL) 500 MG tablet Take 500 mg by mouth every 6 (six) hours as needed.     Yes Historical Provider, MD  aspirin EC 81 MG tablet Take 81 mg by mouth daily.     Yes Historical Provider, MD  cyanocobalamin (,VITAMIN B-12,) 1000 MCG/ML injection Inject 1,000 mcg into the muscle every 30 (thirty) days.     Yes Historical Provider,  MD  ergocalciferol (VITAMIN D2) 50000 UNITS capsule Take 50,000 Units by mouth once a week. Monday   Yes Historical Provider, MD  furosemide (LASIX) 40 MG tablet Take 60 mg by mouth daily.    Yes Historical Provider, MD  gabapentin (NEURONTIN) 300 MG capsule Take 300 mg by mouth 2 (two) times daily.     Yes Historical Provider, MD  glimepiride (AMARYL) 2 MG tablet Take 2 mg by mouth daily with breakfast.   Yes Historical Provider, MD  glipiZIDE (GLUCOTROL XL) 10 MG 24 hr tablet Take 10 mg by mouth daily.     Yes Historical Provider, MD  insulin glargine (LANTUS) 100 UNIT/ML injection Inject 62 Units into  the skin at bedtime.   Yes Historical Provider, MD  insulin regular (NOVOLIN R,HUMULIN R) 100 units/mL injection Inject 12 Units into the skin 3 (three) times daily before meals.    Yes Historical Provider, MD  levothyroxine (SYNTHROID, LEVOTHROID) 125 MCG tablet Take 125 mcg by mouth daily.     Yes Historical Provider, MD  LORazepam (ATIVAN) 1 MG tablet Take 1 mg by mouth 2 (two) times daily.     Yes Historical Provider, MD  losartan (COZAAR) 100 MG tablet Take 100 mg by mouth daily.     Yes Historical Provider, MD  meloxicam (MOBIC) 7.5 MG tablet Take 7.5 mg by mouth daily.     Yes Historical Provider, MD  metoprolol (TOPROL-XL) 100 MG 24 hr tablet Take 150 mg by mouth daily.     Yes Historical Provider, MD  Multiple Vitamins-Minerals (MULTIVITAMIN WITH MINERALS) tablet Take 1 tablet by mouth daily.   Yes Historical Provider, MD  omeprazole (PRILOSEC) 20 MG capsule Take 20 mg by mouth daily.     Yes Historical Provider, MD  PARoxetine (PAXIL) 20 MG tablet Take 20 mg by mouth every morning.     Yes Historical Provider, MD  potassium chloride SA (K-DUR,KLOR-CON) 20 MEQ tablet Take 20 mEq by mouth daily.     Yes Historical Provider, MD     History   Social History  . Marital Status: Married    Spouse Name: N/A  . Number of Children: N/A  . Years of Education: N/A   Occupational History  . Not on file.   Social History Main Topics  . Smoking status: Never Smoker   . Smokeless tobacco: Never Used  . Alcohol Use: No  . Drug Use: No  . Sexual Activity: Not on file   Other Topics Concern  . Not on file   Social History Narrative    No family status information on file.   Family History  Problem Relation Age of Onset  . Heart disease Father      ROS:  Full 14 point review of systems complete and found to be negative unless listed above.  Physical Exam: Blood pressure 153/72, pulse 61, temperature 98.7 F (37.1 C), temperature source Oral, resp. rate 18, height 5' (1.524 m),  weight 229 lb 1.6 oz (103.919 kg), SpO2 95 %.  General: Well developed, well nourished, female in no acute distress Head: Eyes PERRLA, No xanthomas.   Normocephalic and atraumatic, oropharynx without edema or exudate. Dentition:  Lungs:  Heart: HRRR S1 S2, no rub/gallop, Heart irregular rate and rhythm with S1, S2  murmur. pulses are 2+ extrem.   Neck: No carotid bruits. No lymphadenopathy.  JVD. Abdomen: Bowel sounds present, abdomen soft and non-tender without masses or hernias noted. Msk:  No spine or cva tenderness. No weakness,  no joint deformities or effusions. Extremities: No clubbing or cyanosis.  edema.  Neuro: Alert and oriented X 3. No focal deficits noted. Psych:  Good affect, responds appropriately Skin: No rashes or lesions noted.  Labs:   Lab Results  Component Value Date   WBC 9.0 10/13/2014   HGB 15.6* 10/13/2014   HCT 46.0 10/13/2014   MCV 93.2 10/13/2014   PLT 197 10/13/2014    Recent Labs  10/13/14 1500  INR 0.92    Recent Labs Lab 10/13/14 1500 10/13/14 1532  NA 142 142  K 3.8 3.7  CL 102 101  CO2 28  --   BUN 16 18  CREATININE 0.83 0.90  CALCIUM 10.4  --   PROT 7.3  --   BILITOT 1.0  --   ALKPHOS 89  --   ALT 27  --   AST 42*  --   GLUCOSE 148* 151*  ALBUMIN 3.9  --      Recent Labs  10/13/14 1519  TROPIPOC 0.00   No results found for: PROBNP Lab Results  Component Value Date   CHOL 207* 10/14/2014   HDL 42 10/14/2014   LDLCALC 93 10/14/2014   TRIG 358* 10/14/2014   Echo: Study Date: 10/14/2014 LV EF: 55% -  60% Study Conclusions - Left ventricle: The cavity size was normal. Systolic function was normal. The estimated ejection fraction was in the range of 55% to 60%. Wall motion was normal; there were no regional wall motion abnormalities. Features are consistent with a pseudonormal left ventricular filling pattern, with concomitant abnormal relaxation and increased filling pressure (grade 2  diastolic dysfunction).  ECG:  HR 104 Sinus tachycardia Incomplete RBBB and LAFB Consider right ventricular hypertrophy LVH with secondary repolarization abnormality Borderline prolonged QT interval No significant change since last tracing   Radiology:  Ct Head Wo Contrast  10/13/2014   CLINICAL DATA:  64 year old female who awoke at 1000 hours with left side weakness, left facial droop, eighth aphasia, headache. Initial encounter.  EXAM: CT HEAD WITHOUT CONTRAST  TECHNIQUE: Contiguous axial images were obtained from the base of the skull through the vertex without intravenous contrast.  COMPARISON:  [facility] non contrast head CT 11/17/2010.  FINDINGS: Visualized paranasal sinuses and mastoids are clear. Calcified atherosclerosis at the skull base. No acute osseous abnormality identified. Stable and negative orbit and scalp soft tissues.  Progressed Patchy and confluent bilateral cerebral white matter hypodensity,. Chronic appearing lacunar infarcts affecting the deep gray matter nuclei. That involving the anterior left thalamus appears to of been present in 2012. A large but chronic appearing left basal ganglia lacunar infarct is new since that time.  No superimposed acute cortically based infarct identified. No midline shift, mass effect, or evidence of intracranial mass lesion. No ventriculomegaly. No acute intracranial hemorrhage identified. No suspicious intracranial vascular hyperdensity.  IMPRESSION: Progressed chronic small vessel ischemia since 2012. No acute cortically based infarct or acute intracranial hemorrhage identified.   Electronically Signed   By: Odessa Fleming M.D.   On: 10/13/2014 16:34   Mr [name] Head Wo Contrast  10/13/2014   CLINICAL DATA:  Left-sided weakness and slurred speech. Symptoms for 3 weeks. Facial droop and occipital headache.  EXAM: MRI HEAD WITHOUT CONTRAST  MRA HEAD WITHOUT CONTRAST  TECHNIQUE: Multiplanar, multiecho pulse sequences of the brain and  surrounding structures were obtained without intravenous contrast. Angiographic images of the head were obtained using MRA technique without contrast.  COMPARISON:  CT head without contrast 10/13/2014.  FINDINGS: MRI HEAD FINDINGS  At least 9 separate foci of restricted diffusion are present throughout both cerebral hemispheres. A lesion in the superior temporal gyrus demonstrates distinctly restricted diffusion. A lesion in the anterior right frontal lobe is more indistinct. Other lesions are along this spectrum, suggesting infarcts of the areas acute/subacute age over the last 1-2 weeks. The largest focus of lesions is in the posterior right frontal lobe. There are lesions in the posterior left frontal lobe white matter as well. There is no associated hemorrhage. T2 changes are associated.  A remote infarct involves the anterior portion of the left lentiform nucleus. There are remote lacunar infarcts in the thalami bilaterally. Remote white matter lacunar infarcts are present adjacent to the atrium of the lateral ventricles bilaterally. Confluent periventricular and subcortical white matter changes are evident otherwise as well.  There is no acute hemorrhage or mass lesion. Ex vacuo dilation is noted in the left lateral ventricle associated with the remote basal ganglia infarcts.  Flow is present in the major intracranial arteries. The right lens is replaced. The globes and orbits are otherwise intact. The paranasal sinuses are clear. There is minimal fluid in the left mastoid air cells. No obstructing nasopharyngeal lesion is evident.  MRA HEAD FINDINGS  Mild atherosclerotic irregularity is present within the cavernous internal carotid arteries bilaterally. The study is mildly degraded by motion. There is mild narrowing of proximal right A1 segment. The A1 and M1 segments are otherwise normal. The anterior communicating artery is patent. A high-grade stenosis is present in the mass posterior right M2 segment and  the most anterior right left M2 segment. There is marked attenuation of MCA branch vessels bilaterally.  The vertebral arteries are codominant. There is moderate stenosis of the proximal right PICA. The left PICA origin is visualized and normal. The basilar artery is normal. Both posterior cerebral arteries originate from the basilar tip. Moderate attenuation of PCA branch vessels is present bilaterally, worse on the left. There is a mild distal right P2 segment stenosis.  IMPRESSION: 1. Multiple punctate acute/subacute infarcts of varying ages compatible with a history of symptoms over the last 3 weeks. The largest cluster of infarcts is in the posterior right frontal lobe, compatible with left-sided numbness and weakness. 2. Other more remote infarcts are present within the left greater than right basal ganglia and thalami as well as within the deep white matter. 3. Extensive small vessel white matter changes are present bilaterally. 4. Extensive branch vessel stenoses and small vessel disease in both the anterior and posterior circulation. 5. Prominent proximal stenoses in M2 branches bilaterally. 6. No significant stenosis of the medium or large intracranial vessels proximal to the MCA bifurcations. These results were called by telephone at the time of interpretation on 10/13/2014 at 840 pm: To Georga Hacking, RN, who verbally acknowledged these results.   Electronically Signed   By: Marin Roberts M.D.   On: 10/13/2014 20:40   Mr Brain Wo Contrast  10/13/2014   CLINICAL DATA:  Left-sided weakness and slurred speech. Symptoms for 3 weeks. Facial droop and occipital headache.  EXAM: MRI HEAD WITHOUT CONTRAST  MRA HEAD WITHOUT CONTRAST  TECHNIQUE: Multiplanar, multiecho pulse sequences of the brain and surrounding structures were obtained without intravenous contrast. Angiographic images of the head were obtained using MRA technique without contrast.  COMPARISON:  CT head without contrast 10/13/2014.   FINDINGS: MRI HEAD FINDINGS  At least 9 separate foci of restricted diffusion are present throughout both cerebral hemispheres. A lesion  in the superior temporal gyrus demonstrates distinctly restricted diffusion. A lesion in the anterior right frontal lobe is more indistinct. Other lesions are along this spectrum, suggesting infarcts of the areas acute/subacute age over the last 1-2 weeks. The largest focus of lesions is in the posterior right frontal lobe. There are lesions in the posterior left frontal lobe white matter as well. There is no associated hemorrhage. T2 changes are associated.  A remote infarct involves the anterior portion of the left lentiform nucleus. There are remote lacunar infarcts in the thalami bilaterally. Remote white matter lacunar infarcts are present adjacent to the atrium of the lateral ventricles bilaterally. Confluent periventricular and subcortical white matter changes are evident otherwise as well.  There is no acute hemorrhage or mass lesion. Ex vacuo dilation is noted in the left lateral ventricle associated with the remote basal ganglia infarcts.  Flow is present in the major intracranial arteries. The right lens is replaced. The globes and orbits are otherwise intact. The paranasal sinuses are clear. There is minimal fluid in the left mastoid air cells. No obstructing nasopharyngeal lesion is evident.  MRA HEAD FINDINGS  Mild atherosclerotic irregularity is present within the cavernous internal carotid arteries bilaterally. The study is mildly degraded by motion. There is mild narrowing of proximal right A1 segment. The A1 and M1 segments are otherwise normal. The anterior communicating artery is patent. A high-grade stenosis is present in the mass posterior right M2 segment and the most anterior right left M2 segment. There is marked attenuation of MCA branch vessels bilaterally.  The vertebral arteries are codominant. There is moderate stenosis of the proximal right PICA. The  left PICA origin is visualized and normal. The basilar artery is normal. Both posterior cerebral arteries originate from the basilar tip. Moderate attenuation of PCA branch vessels is present bilaterally, worse on the left. There is a mild distal right P2 segment stenosis.  IMPRESSION: 1. Multiple punctate acute/subacute infarcts of varying ages compatible with a history of symptoms over the last 3 weeks. The largest cluster of infarcts is in the posterior right frontal lobe, compatible with left-sided numbness and weakness. 2. Other more remote infarcts are present within the left greater than right basal ganglia and thalami as well as within the deep white matter. 3. Extensive small vessel white matter changes are present bilaterally. 4. Extensive branch vessel stenoses and small vessel disease in both the anterior and posterior circulation. 5. Prominent proximal stenoses in M2 branches bilaterally. 6. No significant stenosis of the medium or large intracranial vessels proximal to the MCA bifurcations. These results were called by telephone at the time of interpretation on 10/13/2014 at 840 pm: To Georga Hacking, RN, who verbally acknowledged these results.   Electronically Signed   By: Marin Roberts M.D.   On: 10/13/2014 20:40    ASSESSMENT AND PLAN:    Principal Problem:   CVA (cerebral infarction) Active Problems:   Fibromyalgia   Diabetes mellitus   Depression with anxiety   Obesity   Hyperlipidemia   CVA (cerebral vascular accident)   Dawn Ho is a 64 y.o. 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 MRI showed multiple punctate acute/subacute infarcts of varying ages. Largest cluster of infarcts in the posterior right frontal lobe compatible with left-sided numbness and weakness. Family reported a  possible remote hx of afib and cardiology was  consulted on TEE vs just starting AC.  Bilateral punctate acute/subacute infarcts- patient has a possible remote history of A. Fib. -- 2D ECHO today with normal LV function, no RWMA and G2DD.  -- No afib on tele and I have not seen any documentation of afib in past notes. Will plan for TEE with possible LOOP recorder placement tomorrow. NPO after midnight.   Hypertension- blood pressure is stable, continue metoprolol.  Diabetes mellitus- continue Lantus and sliding scale insulin  Depression with anxiety- continue Ativan and Paxil  Hypothyroidism-continue Synthroid  SignedAllena Katz 10/14/2014 5:33 PM  Pager [phone]  Co-Sign MD   The patient was seen, examined and discussed with Carlean Jews, PA-C and I agree with the above.   64 year old female with h/o chest pain, multiple consults in the past with no follow up, no ischemic work up who presented with CVA with multiple multiple punctate acute/subacute infarcts on brain MRI. THe patient is a poor historian, however the family reports remote h/o paroxysmal atrial fibrillation, never documented. No a-fib on telemetry. Echocardiogram shows preserved LVEF 55-60% and normal left atrial size. We will plan on TEE to rule out intracardiac source of embolism and if negative plan for implantation of an loop recorder.  For now I would just continue aspirin and plavix as we have no evidence of atrial fibrillation.   Lars Masson 10/14/2014

## 2014-10-14 NOTE — Progress Notes (Signed)
OT Cancellation Note  Patient Details Name: Dawn Ho MRN: [id number] DOB: [birth year]   Cancelled Treatment:    Reason Eval/Treat Not Completed: Patient at procedure or test/ unavailable. Pt is down for testing in vascular.  Evette GeorgesLeonard, Etienne Mowers Eva [phone] 10/14/2014, 3:25 PM

## 2014-10-14 NOTE — Evaluation (Signed)
Speech Language Pathology Evaluation Patient Details Name: Dawn Ho MRN: [id number] DOB: 02-06-1951 Today's Date: 10/14/2014 Time: 2952-8413 SLP Time Calculation (min) (ACUTE ONLY): 46 min  Problem List:  Patient Active Problem List   Diagnosis Date Noted  . CVA (cerebral vascular accident) 10/14/2014  . CVA (cerebral infarction)   . TIA (transient ischemic attack) 10/13/2014  . Chest pain 08/07/2011  . Fibromyalgia 08/07/2011  . Costochondritis 08/07/2011  . Diabetes mellitus 08/07/2011  . Depression with anxiety 08/07/2011  . Obesity 08/07/2011  . History of TIAs 08/07/2011  . Hyperlipidemia 08/07/2011   Past Medical History:  Past Medical History  Diagnosis Date  . Fibromyalgia   . Diabetes mellitus   . Hypertension   . Dyslipidemia   . TIA (transient ischemic attack)   . Depression with anxiety   . CVA (cerebral infarction)    Past Surgical History:  Past Surgical History  Procedure Laterality Date  . Cholecystectomy    . Cesarean section     HPI:  64 y.o. 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 MRI revealed bilateral punctate acute/subacute infarcts, largest cluster in the posterior right frontal lobe. Old left greater than right basal ganglia and thalami and deep white matter infarcts   Assessment / Plan / Recommendation Clinical Impression  Pt presents with moderate cognitive-linguistic deficits marked by impaired attention with difficulty establishing and maintaining set; limited short-term recall (visual >auditory memory); reduced visuospatial construction; impaired judgement/awareness of condition.  Speech mildly dysarthric.  Pt lives with husband; had baseline short-term memory deficits after prior stroke.  Will require SLP f/u in this venue and the next to address diffuse cognitive deficits.  Pt/family  educated re: results, impact on function.      SLP Assessment  Patient needs continued Speech Lanaguage Pathology Services    Follow Up Recommendations   (tba)    Frequency and Duration min 2x/week  2 weeks   Pertinent Vitals/Pain Pain Assessment: No/denies pain   SLP Goals  Potential to Achieve Goals (ACUTE ONLY): Good  SLP Evaluation Prior Functioning  Cognitive/Linguistic Baseline: Baseline deficits Baseline deficit details: short-term memory Type of Home: House (one story.  Three steps to entry.)  Lives With: Spouse Available Help at Discharge: Family;Available 24 hours/day Vocation: On disability   Cognition  Overall Cognitive Status: Impaired/Different from baseline Arousal/Alertness: Awake/alert Orientation Level: Disoriented to time;Disoriented to place;Oriented to person;Oriented to situation Attention: Sustained Sustained Attention: Impaired Sustained Attention Impairment: Verbal basic;Functional basic Memory: Impaired Memory Impairment: Storage deficit;Retrieval deficit Awareness: Impaired Awareness Impairment: Intellectual impairment Problem Solving: Impaired Problem Solving Impairment: Verbal basic;Functional basic Executive Function: Reasoning Reasoning: Impaired Behaviors: Restless;Impulsive Safety/Judgment: Impaired    Comprehension  Auditory Comprehension Overall Auditory Comprehension: Appears within functional limits for tasks assessed Visual Recognition/Discrimination Discrimination: Within Function Limits Reading Comprehension Reading Status: Not tested    Expression Expression Primary Mode of Expression: Verbal Verbal Expression Overall Verbal Expression: Appears within functional limits for tasks assessed Written Expression Dominant Hand: Right Written Expression: Not tested   Oral / Motor Oral Motor/Sensory Function Overall Oral Motor/Sensory Function: Other (comment) (mild left lower face asymmetry) Motor Speech Overall Motor  Speech: Impaired Respiration: Impaired Level of Impairment: Sentence Phonation: Low vocal intensity Resonance: Within functional limits Articulation: Impaired Level of Impairment: Sentence Intelligibility: Intelligibility reduced Word: 75-100% accurate Phrase: 75-100% accurate Sentence: 75-100% accurate Conversation: 75-100% accurate Motor Planning: Witnin functional limits   [employer]  Dawn Shines, MA CCC/SLP Pager [phone]      Dawn Ho 10/14/2014, 2:51 PM

## 2014-10-14 NOTE — Progress Notes (Signed)
PT Cancellation Note  Patient Details Name: Dawn Ho MRN: [id number] DOB: 08-11-1951   Cancelled Treatment:    Reason Eval/Treat Not Completed: Other (comment) (pt just fell asleep after being up in chair all morning. Will attempt later today or tomorrow. )   Tamala SerUhlenberg, Jalia Zuniga Kistler 10/14/2014, 1:32 PM [phone]

## 2014-10-14 NOTE — Progress Notes (Signed)
  Echocardiogram 2D Echocardiogram has been performed.  Clearence PedCrouch, Sydne Krahl M 10/14/2014, 4:17 PM

## 2014-10-14 NOTE — Progress Notes (Signed)
OT Cancellation Note  Patient Details Name: Dawn Ho MRN: [id number] DOB: 1950-10-30   Cancelled Treatment:    Reason Eval/Treat Not Completed: Other (comment). Pt had just dozed off to sleep from being up in recliner most of there morning per family. Will re-try eval later today.  Evette GeorgesLeonard, Raymund Manrique Eva [phone] 10/14/2014, 1:14 PM

## 2014-10-14 NOTE — Progress Notes (Signed)
[facility] PROGRESS NOTE  Dawn Ho GEX:[id number] DOB: 1951/01/22 DOA: 10/13/2014 PCP: No PCP Per Patient  Assessment/Plan: 1. Bilateral punctate acute/subacute infarcts- patient has remote history of A. fib. She has been started on aspirin 325 mg by mouth daily. Discussed with neurology, Dr. Pearlean Brownie recommends patient be seen by cardiology for further evaluation regarding TEE versus starting anticoagulation. Will obtain cardiac consultation. 2. Hypertension- blood pressure is stable, continue metoprolol. 3. Diabetes mellitus- continue Lantus and sliding scale insulin 4. Depression with anxiety- continue Ativan and Paxil 5. Hypothyroidism-continue Synthroid 6. DVT prophylaxis- Lovenox  Code Status: Full code Family Communication: *No family at bedside Disposition Plan: *Home when stable   Consultants:  *Neurology  Procedures:  None  Antibiotics:  None  HPI/Subjective: 64 y.o. 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 She woke up this morning with a posterior headache. Her husband noted that she was slurring her words and she had a left-sided facial droop. He does state that this has been on and off for about 3 weeks now but this morning it just didn't resolve and therefore he brought her into the ER. In the ER the symptoms persisted and I was called to do her admission for CVA. By the time I walked into the room the family states her symptoms had resolved. She has been taking a baby aspirin every day that has not had her Lipitor. She and admits to the fact that she has run out of some of her medications including Toprol Prilosec and Lipitor. She is in between doctors and because it took so long to transfer records from one doctor to another doctor and the fact that she has not been able to see the new doctor yet, neither of  them aren't willing to prescribe her medicines for her.  MRI showed multiple punctate acute/subacute infarcts of varying ages him better with a history of symptoms over the past 3 weeks. Largest cluster of infarcts in the posterior right frontal lobe compatible with left-sided numbness and weakness.   Objective: Filed Vitals:   10/14/14 1001  BP: 158/79  Pulse: 62  Temp: 98.1 F (36.7 C)  Resp: 16   No intake or output data in the 24 hours ending 10/14/14 1350 Filed Weights   10/13/14 1930  Weight: 103.919 kg (229 lb 1.6 oz)    Exam:   General:  Appearing no acute distress  Cardiovascular: S1-S2 regular  Respiratory: Clear to auscultation bilaterally  Abdomen: Soft nontender no organomegaly  Musculoskeletal: No edema  Neuro- alert and oriented 3, mild weakness of left upper and lower extremity  Data Reviewed: Basic Metabolic Panel:  Recent Labs Lab 10/13/14 1500 10/13/14 1532  NA 142 142  K 3.8 3.7  CL 102 101  CO2 28  --   GLUCOSE 148* 151*  BUN 16 18  CREATININE 0.83 0.90  CALCIUM 10.4  --    Liver Function Tests:  Recent Labs Lab 10/13/14 1500  AST 42*  ALT 27  ALKPHOS 89  BILITOT 1.0  PROT 7.3  ALBUMIN 3.9   No results for input(s): LIPASE, AMYLASE in the last 168 hours. No results for input(s): AMMONIA in the last 168 hours. CBC:  Recent Labs Lab 10/13/14 1500 10/13/14 1532  WBC 9.0  --   NEUTROABS 5.3  --   HGB 14.2 15.6*  HCT 42.8 46.0  MCV 93.2  --  PLT 197  --    Cardiac Enzymes: No results for input(s): CKTOTAL, CKMB, CKMBINDEX, TROPONINI in the last 168 hours. BNP (last 3 results) No results for input(s): BNP in the last 8760 hours.  ProBNP (last 3 results) No results for input(s): PROBNP in the last 8760 hours.  CBG:  Recent Labs Lab 10/13/14 2047 10/14/14 0644 10/14/14 1213  GLUCAP 183* 142* 209*    No results found for this or any previous visit (from the past 240 hour(s)).   Studies: Ct Head Wo  Contrast  10/13/2014   CLINICAL DATA:  64 year old female who awoke at 1000 hours with left side weakness, left facial droop, eighth aphasia, headache. Initial encounter.  EXAM: CT HEAD WITHOUT CONTRAST  TECHNIQUE: Contiguous axial images were obtained from the base of the skull through the vertex without intravenous contrast.  COMPARISON:  [facility] non contrast head CT 11/17/2010.  FINDINGS: Visualized paranasal sinuses and mastoids are clear. Calcified atherosclerosis at the skull base. No acute osseous abnormality identified. Stable and negative orbit and scalp soft tissues.  Progressed Patchy and confluent bilateral cerebral white matter hypodensity,. Chronic appearing lacunar infarcts affecting the deep gray matter nuclei. That involving the anterior left thalamus appears to of been present in 2012. A large but chronic appearing left basal ganglia lacunar infarct is new since that time.  No superimposed acute cortically based infarct identified. No midline shift, mass effect, or evidence of intracranial mass lesion. No ventriculomegaly. No acute intracranial hemorrhage identified. No suspicious intracranial vascular hyperdensity.  IMPRESSION: Progressed chronic small vessel ischemia since 2012. No acute cortically based infarct or acute intracranial hemorrhage identified.   Electronically Signed   By: Odessa Fleming M.D.   On: 10/13/2014 16:34   Mr [name] Head Wo Contrast  10/13/2014   CLINICAL DATA:  Left-sided weakness and slurred speech. Symptoms for 3 weeks. Facial droop and occipital headache.  EXAM: MRI HEAD WITHOUT CONTRAST  MRA HEAD WITHOUT CONTRAST  TECHNIQUE: Multiplanar, multiecho pulse sequences of the brain and surrounding structures were obtained without intravenous contrast. Angiographic images of the head were obtained using MRA technique without contrast.  COMPARISON:  CT head without contrast 10/13/2014.  FINDINGS: MRI HEAD FINDINGS  At least 9 separate foci of restricted diffusion  are present throughout both cerebral hemispheres. A lesion in the superior temporal gyrus demonstrates distinctly restricted diffusion. A lesion in the anterior right frontal lobe is more indistinct. Other lesions are along this spectrum, suggesting infarcts of the areas acute/subacute age over the last 1-2 weeks. The largest focus of lesions is in the posterior right frontal lobe. There are lesions in the posterior left frontal lobe white matter as well. There is no associated hemorrhage. T2 changes are associated.  A remote infarct involves the anterior portion of the left lentiform nucleus. There are remote lacunar infarcts in the thalami bilaterally. Remote white matter lacunar infarcts are present adjacent to the atrium of the lateral ventricles bilaterally. Confluent periventricular and subcortical white matter changes are evident otherwise as well.  There is no acute hemorrhage or mass lesion. Ex vacuo dilation is noted in the left lateral ventricle associated with the remote basal ganglia infarcts.  Flow is present in the major intracranial arteries. The right lens is replaced. The globes and orbits are otherwise intact. The paranasal sinuses are clear. There is minimal fluid in the left mastoid air cells. No obstructing nasopharyngeal lesion is evident.  MRA HEAD FINDINGS  Mild atherosclerotic irregularity is present within the cavernous internal  carotid arteries bilaterally. The study is mildly degraded by motion. There is mild narrowing of proximal right A1 segment. The A1 and M1 segments are otherwise normal. The anterior communicating artery is patent. A high-grade stenosis is present in the mass posterior right M2 segment and the most anterior right left M2 segment. There is marked attenuation of MCA branch vessels bilaterally.  The vertebral arteries are codominant. There is moderate stenosis of the proximal right PICA. The left PICA origin is visualized and normal. The basilar artery is normal. Both  posterior cerebral arteries originate from the basilar tip. Moderate attenuation of PCA branch vessels is present bilaterally, worse on the left. There is a mild distal right P2 segment stenosis.  IMPRESSION: 1. Multiple punctate acute/subacute infarcts of varying ages compatible with a history of symptoms over the last 3 weeks. The largest cluster of infarcts is in the posterior right frontal lobe, compatible with left-sided numbness and weakness. 2. Other more remote infarcts are present within the left greater than right basal ganglia and thalami as well as within the deep white matter. 3. Extensive small vessel white matter changes are present bilaterally. 4. Extensive branch vessel stenoses and small vessel disease in both the anterior and posterior circulation. 5. Prominent proximal stenoses in M2 branches bilaterally. 6. No significant stenosis of the medium or large intracranial vessels proximal to the MCA bifurcations. These results were called by telephone at the time of interpretation on 10/13/2014 at 840 pm: To Georga Hacking, RN, who verbally acknowledged these results.   Electronically Signed   By: Marin Roberts M.D.   On: 10/13/2014 20:40   Mr Brain Wo Contrast  10/13/2014   CLINICAL DATA:  Left-sided weakness and slurred speech. Symptoms for 3 weeks. Facial droop and occipital headache.  EXAM: MRI HEAD WITHOUT CONTRAST  MRA HEAD WITHOUT CONTRAST  TECHNIQUE: Multiplanar, multiecho pulse sequences of the brain and surrounding structures were obtained without intravenous contrast. Angiographic images of the head were obtained using MRA technique without contrast.  COMPARISON:  CT head without contrast 10/13/2014.  FINDINGS: MRI HEAD FINDINGS  At least 9 separate foci of restricted diffusion are present throughout both cerebral hemispheres. A lesion in the superior temporal gyrus demonstrates distinctly restricted diffusion. A lesion in the anterior right frontal lobe is more indistinct. Other  lesions are along this spectrum, suggesting infarcts of the areas acute/subacute age over the last 1-2 weeks. The largest focus of lesions is in the posterior right frontal lobe. There are lesions in the posterior left frontal lobe white matter as well. There is no associated hemorrhage. T2 changes are associated.  A remote infarct involves the anterior portion of the left lentiform nucleus. There are remote lacunar infarcts in the thalami bilaterally. Remote white matter lacunar infarcts are present adjacent to the atrium of the lateral ventricles bilaterally. Confluent periventricular and subcortical white matter changes are evident otherwise as well.  There is no acute hemorrhage or mass lesion. Ex vacuo dilation is noted in the left lateral ventricle associated with the remote basal ganglia infarcts.  Flow is present in the major intracranial arteries. The right lens is replaced. The globes and orbits are otherwise intact. The paranasal sinuses are clear. There is minimal fluid in the left mastoid air cells. No obstructing nasopharyngeal lesion is evident.  MRA HEAD FINDINGS  Mild atherosclerotic irregularity is present within the cavernous internal carotid arteries bilaterally. The study is mildly degraded by motion. There is mild narrowing of proximal right A1 segment. The A1  and M1 segments are otherwise normal. The anterior communicating artery is patent. A high-grade stenosis is present in the mass posterior right M2 segment and the most anterior right left M2 segment. There is marked attenuation of MCA branch vessels bilaterally.  The vertebral arteries are codominant. There is moderate stenosis of the proximal right PICA. The left PICA origin is visualized and normal. The basilar artery is normal. Both posterior cerebral arteries originate from the basilar tip. Moderate attenuation of PCA branch vessels is present bilaterally, worse on the left. There is a mild distal right P2 segment stenosis.   IMPRESSION: 1. Multiple punctate acute/subacute infarcts of varying ages compatible with a history of symptoms over the last 3 weeks. The largest cluster of infarcts is in the posterior right frontal lobe, compatible with left-sided numbness and weakness. 2. Other more remote infarcts are present within the left greater than right basal ganglia and thalami as well as within the deep white matter. 3. Extensive small vessel white matter changes are present bilaterally. 4. Extensive branch vessel stenoses and small vessel disease in both the anterior and posterior circulation. 5. Prominent proximal stenoses in M2 branches bilaterally. 6. No significant stenosis of the medium or large intracranial vessels proximal to the MCA bifurcations. These results were called by telephone at the time of interpretation on 10/13/2014 at 840 pm: To Georga Hacking, RN, who verbally acknowledged these results.   Electronically Signed   By: Marin Roberts M.D.   On: 10/13/2014 20:40    Scheduled Meds: . aspirin  325 mg Oral Daily  . enoxaparin (LOVENOX) injection  40 mg Subcutaneous Q24H  . furosemide  60 mg Oral Daily  . gabapentin  300 mg Oral BID  . insulin aspart  8 Units Subcutaneous TID WC  . insulin glargine  55 Units Subcutaneous QHS  . levothyroxine  125 mcg Oral QAC breakfast  . LORazepam  1 mg Oral BID  . losartan  100 mg Oral Daily  . meloxicam  7.5 mg Oral Daily  . metoprolol succinate  150 mg Oral Daily  . pantoprazole  40 mg Oral Daily  . PARoxetine  20 mg Oral Daily  . [START ON 10/15/2014] pneumococcal 23 valent vaccine  0.5 mL Intramuscular Tomorrow-1000  . potassium chloride  20 mEq Oral Daily   Continuous Infusions:   Principal Problem:   TIA (transient ischemic attack) Active Problems:   Fibromyalgia   Diabetes mellitus   Depression with anxiety   Obesity   Hyperlipidemia   CVA (cerebral infarction)    Time spent: *25 min    [facility]  [facility] Pager [phone].  If 7PM-7AM, please contact night-coverage at [email or web address], password [facility] 10/14/2014, 1:50 PM  LOS: 1 day

## 2014-10-15 ENCOUNTER — Encounter (HOSPITAL_COMMUNITY)
Admission: EM | Disposition: A | Discharge status: Skilled Nursing Facility | Payer: Self-pay | Source: Home / Self Care | Attending: Family Medicine

## 2014-10-15 ENCOUNTER — Encounter (HOSPITAL_COMMUNITY): Payer: Self-pay | Admitting: *Deleted

## 2014-10-15 DIAGNOSIS — I63031 Cerebral infarction due to thrombosis of right carotid artery: Secondary | ICD-10-CM

## 2014-10-15 DIAGNOSIS — E119 Type 2 diabetes mellitus without complications: Secondary | ICD-10-CM | POA: Diagnosis not present

## 2014-10-15 DIAGNOSIS — E039 Hypothyroidism, unspecified: Secondary | ICD-10-CM | POA: Diagnosis not present

## 2014-10-15 DIAGNOSIS — R29810 Facial weakness: Secondary | ICD-10-CM | POA: Diagnosis not present

## 2014-10-15 DIAGNOSIS — R4701 Aphasia: Secondary | ICD-10-CM | POA: Diagnosis not present

## 2014-10-15 DIAGNOSIS — M79642 Pain in left hand: Secondary | ICD-10-CM | POA: Diagnosis not present

## 2014-10-15 DIAGNOSIS — Z794 Long term (current) use of insulin: Secondary | ICD-10-CM | POA: Diagnosis not present

## 2014-10-15 DIAGNOSIS — Z7982 Long term (current) use of aspirin: Secondary | ICD-10-CM | POA: Diagnosis not present

## 2014-10-15 DIAGNOSIS — I639 Cerebral infarction, unspecified: Secondary | ICD-10-CM

## 2014-10-15 DIAGNOSIS — I10 Essential (primary) hypertension: Secondary | ICD-10-CM

## 2014-10-15 DIAGNOSIS — Z91041 Radiographic dye allergy status: Secondary | ICD-10-CM | POA: Diagnosis not present

## 2014-10-15 DIAGNOSIS — I6529 Occlusion and stenosis of unspecified carotid artery: Secondary | ICD-10-CM | POA: Diagnosis not present

## 2014-10-15 DIAGNOSIS — E785 Hyperlipidemia, unspecified: Secondary | ICD-10-CM | POA: Diagnosis not present

## 2014-10-15 DIAGNOSIS — I340 Nonrheumatic mitral (valve) insufficiency: Secondary | ICD-10-CM

## 2014-10-15 DIAGNOSIS — E669 Obesity, unspecified: Secondary | ICD-10-CM

## 2014-10-15 DIAGNOSIS — Z880 Allergy status to penicillin: Secondary | ICD-10-CM | POA: Diagnosis not present

## 2014-10-15 DIAGNOSIS — Z6841 Body Mass Index (BMI) 40.0 and over, adult: Secondary | ICD-10-CM | POA: Diagnosis not present

## 2014-10-15 DIAGNOSIS — I6340 Cerebral infarction due to embolism of unspecified cerebral artery: Principal | ICD-10-CM

## 2014-10-15 DIAGNOSIS — Z79899 Other long term (current) drug therapy: Secondary | ICD-10-CM | POA: Diagnosis not present

## 2014-10-15 DIAGNOSIS — F418 Other specified anxiety disorders: Secondary | ICD-10-CM | POA: Diagnosis not present

## 2014-10-15 DIAGNOSIS — M797 Fibromyalgia: Secondary | ICD-10-CM | POA: Diagnosis not present

## 2014-10-15 HISTORY — PX: TEE WITHOUT CARDIOVERSION: SHX5443

## 2014-10-15 LAB — HEMOGLOBIN A1C
HEMOGLOBIN A1C: 9.8 % — AB (ref 4.8–5.6)
Mean Plasma Glucose: 235 mg/dL

## 2014-10-15 LAB — GLUCOSE, CAPILLARY
GLUCOSE-CAPILLARY: 241 mg/dL — AB (ref 70–99)
Glucose-Capillary: 201 mg/dL — ABNORMAL HIGH (ref 70–99)
Glucose-Capillary: 209 mg/dL — ABNORMAL HIGH (ref 70–99)
Glucose-Capillary: 166 mg/dL — ABNORMAL HIGH (ref 70–99)

## 2014-10-15 SURGERY — ECHOCARDIOGRAM, TRANSESOPHAGEAL
Anesthesia: Moderate Sedation

## 2014-10-15 SURGERY — LOOP RECORDER IMPLANT
Anesthesia: LOCAL

## 2014-10-15 MED ORDER — SODIUM CHLORIDE 0.9 % IV SOLN
INTRAVENOUS | Status: DC
  Administered 2014-10-15: 500 mL via INTRAVENOUS
  Administered 2014-10-15 – 2014-10-17 (×3): via INTRAVENOUS

## 2014-10-15 MED ORDER — GABAPENTIN 300 MG PO CAPS
300.0000 mg | ORAL_CAPSULE | Freq: Every day | ORAL | Status: DC
  Administered 2014-10-16 – 2014-10-17 (×2): 300 mg via ORAL
  Filled 2014-10-15 (×2): qty 1

## 2014-10-15 MED ORDER — MIDAZOLAM HCL 5 MG/ML IJ SOLN
INTRAMUSCULAR | Status: AC
  Filled 2014-10-15: qty 2

## 2014-10-15 MED ORDER — FENTANYL CITRATE 0.05 MG/ML IJ SOLN
INTRAMUSCULAR | Status: DC | PRN
  Administered 2014-10-15: 25 ug via INTRAVENOUS

## 2014-10-15 MED ORDER — LIDOCAINE VISCOUS 2 % MT SOLN
OROMUCOSAL | Status: DC | PRN
Start: 2014-10-15 — End: 2014-10-15
  Administered 2014-10-15: 1 via OROMUCOSAL

## 2014-10-15 MED ORDER — LIDOCAINE VISCOUS 2 % MT SOLN
OROMUCOSAL | Status: AC
  Filled 2014-10-15: qty 15

## 2014-10-15 MED ORDER — FENTANYL CITRATE 0.05 MG/ML IJ SOLN
INTRAMUSCULAR | Status: AC
  Filled 2014-10-15: qty 2

## 2014-10-15 MED ORDER — DIPHENHYDRAMINE HCL 50 MG/ML IJ SOLN
INTRAMUSCULAR | Status: AC
  Filled 2014-10-15: qty 1

## 2014-10-15 MED ORDER — LIDOCAINE-EPINEPHRINE 1 %-1:100000 IJ SOLN
INTRAMUSCULAR | Status: AC
  Filled 2014-10-15: qty 1

## 2014-10-15 MED ORDER — MIDAZOLAM HCL 10 MG/2ML IJ SOLN
INTRAMUSCULAR | Status: DC | PRN
  Administered 2014-10-15: 2 mg via INTRAVENOUS

## 2014-10-15 MED ORDER — ATORVASTATIN CALCIUM 10 MG PO TABS
20.0000 mg | ORAL_TABLET | Freq: Every day | ORAL | Status: DC
Start: 2014-10-15 — End: 2014-10-18
  Administered 2014-10-15 – 2014-10-17 (×3): 20 mg via ORAL
  Filled 2014-10-15 (×3): qty 2

## 2014-10-15 NOTE — Consult Note (Signed)
ELECTROPHYSIOLOGY CONSULT NOTE  Patient ID: Dawn Ho MRN: [id number], DOB/AGE: Mar 20, 1951   Admit date: 10/13/2014 Date of Consult: 10/15/2014  Primary Physician: No PCP Per Patient Primary Cardiologist: Tobias Alexander Reason for Consultation: Cryptogenic stroke; recommendations regarding Implantable Loop Recorder  History of Present Illness Dawn Ho was admitted on 10/13/2014 with left sided weakness and slurred speech.  Imaging demonstrated multiple punctate acute/subacute infarcts.  She has undergone workup for stroke including echocardiogram and carotid dopplers.  The patient has been monitored on telemetry which has demonstrated sinus rhythm with no arrhythmias.  Inpatient stroke work-up is to be completed with a TEE.   Echocardiogram this admission demonstrated EF 55-60%, grade 2 diastolic dysfunction, LA 39.  Lab work is reviewed and notable for A1C of 9.8.  Prior to admission, the patient denies chest pain, shortness of breath, dizziness, palpitations, or syncope.  They are recovering from their stroke with plans to return home with home health at discharge.  EP has been asked to evaluate for placement of an implantable loop recorder to monitor for atrial fibrillation.  ROS is negative except as outlined above.    Past Medical History  Diagnosis Date  . Fibromyalgia   . Diabetes mellitus   . Hypertension   . Dyslipidemia   . TIA (transient ischemic attack)   . Depression with anxiety   . CVA (cerebral infarction)      Surgical History:  Past Surgical History  Procedure Laterality Date  . Cholecystectomy    . Cesarean section       Prescriptions prior to admission  Medication Sig Dispense Refill Last Dose  . acetaminophen (TYLENOL) 500 MG tablet Take 500 mg by mouth every 6 (six) hours as needed.     10/13/2014 at Unknown time  . aspirin EC 81 MG tablet Take 81 mg by mouth daily.     10/13/2014 at Unknown time  . cyanocobalamin (,VITAMIN B-12,) 1000 MCG/ML  injection Inject 1,000 mcg into the muscle every 30 (thirty) days.     09/20/2014  . ergocalciferol (VITAMIN D2) 50000 UNITS capsule Take 50,000 Units by mouth once a week. Monday   Past Week at Unknown time  . furosemide (LASIX) 40 MG tablet Take 60 mg by mouth daily.    10/13/2014 at Unknown time  . gabapentin (NEURONTIN) 300 MG capsule Take 300 mg by mouth 2 (two) times daily.     10/13/2014 at Unknown time  . glimepiride (AMARYL) 2 MG tablet Take 2 mg by mouth daily with breakfast.   10/13/2014 at Unknown time  . glipiZIDE (GLUCOTROL XL) 10 MG 24 hr tablet Take 10 mg by mouth daily.     10/13/2014 at Unknown time  . insulin glargine (LANTUS) 100 UNIT/ML injection Inject 62 Units into the skin at bedtime.   Past Month at Unknown time  . insulin regular (NOVOLIN R,HUMULIN R) 100 units/mL injection Inject 12 Units into the skin 3 (three) times daily before meals.    10/13/2014 at Unknown time  . levothyroxine (SYNTHROID, LEVOTHROID) 125 MCG tablet Take 125 mcg by mouth daily.     10/13/2014 at Unknown time  . LORazepam (ATIVAN) 1 MG tablet Take 1 mg by mouth 2 (two) times daily.     10/12/2014 at Unknown time  . losartan (COZAAR) 100 MG tablet Take 100 mg by mouth daily.     10/13/2014 at Unknown time  . meloxicam (MOBIC) 7.5 MG tablet Take 7.5 mg by mouth daily.  10/13/2014 at Unknown time  . metoprolol (TOPROL-XL) 100 MG 24 hr tablet Take 150 mg by mouth daily.     10/10/2014 at 0800  . Multiple Vitamins-Minerals (MULTIVITAMIN WITH MINERALS) tablet Take 1 tablet by mouth daily.   10/13/2014 at Unknown time  . omeprazole (PRILOSEC) 20 MG capsule Take 20 mg by mouth daily.     10/12/2014 at Unknown time  . PARoxetine (PAXIL) 20 MG tablet Take 20 mg by mouth every morning.     10/13/2014 at Unknown time  . potassium chloride SA (K-DUR,KLOR-CON) 20 MEQ tablet Take 20 mEq by mouth daily.     10/13/2014 at Unknown time    Inpatient Medications:  . aspirin  325 mg Oral Daily  . atorvastatin  20 mg Oral q1800  .  enoxaparin (LOVENOX) injection  40 mg Subcutaneous Q24H  . furosemide  60 mg Oral Daily  . [START ON 10/16/2014] gabapentin  300 mg Oral QHS  . insulin aspart  8 Units Subcutaneous TID WC  . insulin glargine  55 Units Subcutaneous QHS  . levothyroxine  125 mcg Oral QAC breakfast  . LORazepam  1 mg Oral BID  . losartan  100 mg Oral Daily  . meloxicam  7.5 mg Oral Daily  . pantoprazole  40 mg Oral Daily  . PARoxetine  20 mg Oral Daily  . potassium chloride  20 mEq Oral Daily    Allergies:  Allergies  Allergen Reactions  . Ivp Dye [Iodinated Diagnostic Agents]   . Penicillins Swelling and Rash    History   Social History  . Marital Status: Married    Spouse Name: N/A  . Number of Children: N/A  . Years of Education: N/A   Occupational History  . Not on file.   Social History Main Topics  . Smoking status: Never Smoker   . Smokeless tobacco: Never Used  . Alcohol Use: No  . Drug Use: No  . Sexual Activity: Not on file   Other Topics Concern  . Not on file   Social History Narrative     Family History  Problem Relation Age of Onset  . Heart disease Father      Physical Exam: Filed Vitals:   10/14/14 2208 10/15/14 0209 10/15/14 0627 10/15/14 0950  BP: 161/57 154/54 141/61 115/49  Pulse: 61 54 52 58  Temp: 97.8 F (36.6 C) 97.7 F (36.5 C) 98.1 F (36.7 C) 97.9 F (36.6 C)  TempSrc: Oral Oral Axillary Oral  Resp: 18 18 16 16   Height:      Weight:      SpO2: 95% 93% 97% 94%    GEN- The patient is overweight appearing, alert and oriented x 3 today.   Head- normocephalic, atraumatic Eyes-  Sclera clear, conjunctiva pink Ears- hearing intact Oropharynx- clear Neck- supple, Lungs- Clear to ausculation bilaterally, mildly tachypneic Heart- Regular rate and rhythm  GI- soft, NT, ND, + BS Extremities- no clubbing, cyanosis, + dependant edema MS- no significant deformity or atrophy Skin- no rash or lesion Psych- euthymic mood, full affect   Labs:     Lab Results  Component Value Date   WBC 9.0 10/13/2014   HGB 15.6* 10/13/2014   HCT 46.0 10/13/2014   MCV 93.2 10/13/2014   PLT 197 10/13/2014    Recent Labs Lab 10/13/14 1500 10/13/14 1532  NA 142 142  K 3.8 3.7  CL 102 101  CO2 28  --   BUN 16 18  CREATININE 0.83 0.90  CALCIUM 10.4  --   PROT 7.3  --   BILITOT 1.0  --   ALKPHOS 89  --   ALT 27  --   AST 42*  --   GLUCOSE 148* 151*     Radiology/Studies: Ct Head Wo Contrast 10/13/2014   CLINICAL DATA:  65 year old female who awoke at 1000 hours with left side weakness, left facial droop, eighth aphasia, headache. Initial encounter.  EXAM: CT HEAD WITHOUT CONTRAST  TECHNIQUE: Contiguous axial images were obtained from the base of the skull through the vertex without intravenous contrast.  COMPARISON:  [facility] non contrast head CT 11/17/2010.  FINDINGS: Visualized paranasal sinuses and mastoids are clear. Calcified atherosclerosis at the skull base. No acute osseous abnormality identified. Stable and negative orbit and scalp soft tissues.  Progressed Patchy and confluent bilateral cerebral white matter hypodensity,. Chronic appearing lacunar infarcts affecting the deep gray matter nuclei. That involving the anterior left thalamus appears to of been present in 2012. A large but chronic appearing left basal ganglia lacunar infarct is new since that time.  No superimposed acute cortically based infarct identified. No midline shift, mass effect, or evidence of intracranial mass lesion. No ventriculomegaly. No acute intracranial hemorrhage identified. No suspicious intracranial vascular hyperdensity.  IMPRESSION: Progressed chronic small vessel ischemia since 2012. No acute cortically based infarct or acute intracranial hemorrhage identified.   Electronically Signed   By: Odessa Fleming M.D.   On: 10/13/2014 16:34   Mr [name] Head Wo Contrast 10/13/2014   CLINICAL DATA:  Left-sided weakness and slurred speech. Symptoms for 3 weeks.  Facial droop and occipital headache.  EXAM: MRI HEAD WITHOUT CONTRAST  MRA HEAD WITHOUT CONTRAST  TECHNIQUE: Multiplanar, multiecho pulse sequences of the brain and surrounding structures were obtained without intravenous contrast. Angiographic images of the head were obtained using MRA technique without contrast.  COMPARISON:  CT head without contrast 10/13/2014.  FINDINGS: MRI HEAD FINDINGS  At least 9 separate foci of restricted diffusion are present throughout both cerebral hemispheres. A lesion in the superior temporal gyrus demonstrates distinctly restricted diffusion. A lesion in the anterior right frontal lobe is more indistinct. Other lesions are along this spectrum, suggesting infarcts of the areas acute/subacute age over the last 1-2 weeks. The largest focus of lesions is in the posterior right frontal lobe. There are lesions in the posterior left frontal lobe white matter as well. There is no associated hemorrhage. T2 changes are associated.  A remote infarct involves the anterior portion of the left lentiform nucleus. There are remote lacunar infarcts in the thalami bilaterally. Remote white matter lacunar infarcts are present adjacent to the atrium of the lateral ventricles bilaterally. Confluent periventricular and subcortical white matter changes are evident otherwise as well.  There is no acute hemorrhage or mass lesion. Ex vacuo dilation is noted in the left lateral ventricle associated with the remote basal ganglia infarcts.  Flow is present in the major intracranial arteries. The right lens is replaced. The globes and orbits are otherwise intact. The paranasal sinuses are clear. There is minimal fluid in the left mastoid air cells. No obstructing nasopharyngeal lesion is evident.  MRA HEAD FINDINGS  Mild atherosclerotic irregularity is present within the cavernous internal carotid arteries bilaterally. The study is mildly degraded by motion. There is mild narrowing of proximal right A1 segment.  The A1 and M1 segments are otherwise normal. The anterior communicating artery is patent. A high-grade stenosis is present in the mass posterior right M2 segment and  the most anterior right left M2 segment. There is marked attenuation of MCA branch vessels bilaterally.  The vertebral arteries are codominant. There is moderate stenosis of the proximal right PICA. The left PICA origin is visualized and normal. The basilar artery is normal. Both posterior cerebral arteries originate from the basilar tip. Moderate attenuation of PCA branch vessels is present bilaterally, worse on the left. There is a mild distal right P2 segment stenosis.  IMPRESSION: 1. Multiple punctate acute/subacute infarcts of varying ages compatible with a history of symptoms over the last 3 weeks. The largest cluster of infarcts is in the posterior right frontal lobe, compatible with left-sided numbness and weakness. 2. Other more remote infarcts are present within the left greater than right basal ganglia and thalami as well as within the deep white matter. 3. Extensive small vessel white matter changes are present bilaterally. 4. Extensive branch vessel stenoses and small vessel disease in both the anterior and posterior circulation. 5. Prominent proximal stenoses in M2 branches bilaterally. 6. No significant stenosis of the medium or large intracranial vessels proximal to the MCA bifurcations. These results were called by telephone at the time of interpretation on 10/13/2014 at 840 pm: To Georga Hacking, RN, who verbally acknowledged these results.   Electronically Signed   By: Marin Roberts M.D.   On: 10/13/2014 20:40   12-lead ECG sinus tach, rate 104, IRBBB, LAFB  Telemetry sinus rhythm, PVC's  Assessment and Plan:  1. Cryptogenic stroke The patient presents with cryptogenic stroke.  The patient has a TEE planned for today.  I spoke at length with the patient about monitoring for afib with an implantable loop recorder.   Risks, benefits, and alteratives to implantable loop recorder were discussed with the patient today.   At this time, the patient is very clear in their decision to proceed with implantable loop recorder.   Wound care was reviewed with the patient (keep incision clean and dry for 3 days).  Wound check scheduled for 10-25-14 at 3PM.   2. Obesity Weight loss is advised  3. HTN Stable No change required today   Wound check scheduled for 10-25-14 at 3PM.  Please call with questions.  Hillis Range MD 10/15/2014 1:47 PM

## 2014-10-15 NOTE — Progress Notes (Signed)
Inpatient Diabetes Program Recommendations  AACE/ADA: New Consensus Statement on Inpatient Glycemic Control (2013)  Target Ranges:  Prepandial:   less than 140 mg/dL      Peak postprandial:   less than 180 mg/dL (1-2 hours)      Critically ill patients:  140 - 180 mg/dL   Results for Dawn Ho, Nimrit O (MRN [id number]) as of 10/15/2014 13:27  Ref. Range 10/14/2014 06:44 10/14/2014 12:13 10/14/2014 17:01 10/14/2014 22:12 10/15/2014 06:29 10/15/2014 11:17  Glucose-Capillary Latest Range: 70-99 mg/dL 409142 (H) 811209 (H) 914211 (H) 183 (H) 201 (H) 209 (H)    Diabetes history: DM2 Outpatient Diabetes medications: Lantus 62 units QHS, Novolin R 8 units TID with meals, Glipizide 10 mg daily, Amaryl 2 mg QAM Current orders for Inpatient glycemic control: Lantus 55 units QHS, Novolog 8 units TID with meals  Inpatient Diabetes Program Recommendations Insulin - Basal: Please consider increasing Lantus to 57 units QHS. Correction (SSI): Please order Novolog correction scale. Insulin - Meal Coverage: Currently patient is ordered Novolog 8 units with meals and patient is NPO so no Novolog has been given today depsite CBGs over 200 mg/dl.  Thanks, Orlando PennerMarie Tyaire Odem, RN, MSN, CCRN, CDE Diabetes Coordinator Inpatient Diabetes Program [phone] (Team Pager) [phone] ([facility] office) [phone] [facility] office)

## 2014-10-15 NOTE — Op Note (Signed)
TEE  LA, LAA without masses No PFO by color doppler or with injection of agitated saline MV is normal  Mild to mod MR TV normal AV mildly thickened.  Trace AI PV grossly normal LVEF normal Mild fixed plaque in thoracic aorta

## 2014-10-15 NOTE — Evaluation (Signed)
Physical Therapy Evaluation Patient Details Name: Dawn Ho MRN: [id number] DOB: Aug 20, 1951 Today's Date: 10/15/2014   History of Present Illness  Adm with decr speech and Lt sided weakness; MRI + multiple acute/subacute infarcts (bil) largest Rt posterior frontal and Lt basal ganglia  PMHx- Rt CVA with residual Lt weakness; DM, HTN, fibromyalgiz  Clinical Impression  Pt admitted with above diagnosis. PT evaluation of balance and gait somewhat limited by frequent need to toilet. Pt currently with functional limitations due to the deficits listed below (see PT Problem List). Pt does not think she is far from her baseline, however pt with prior decr memory and per SLP evaluation not at baseline for her cognition. Pt will benefit from skilled PT to increase their independence and safety with mobility to allow discharge to the venue listed below.       Follow Up Recommendations Home health PT;Supervision/Assistance - 24 hour    Equipment Recommendations  None recommended by PT    Recommendations for Other Services OT consult     Precautions / Restrictions Precautions Precautions: Fall      Mobility  Bed Mobility Overal bed mobility: Modified Independent             General bed mobility comments: with rail, HOB 0; incr effort and time  Transfers Overall transfer level: Needs assistance Equipment used: Rolling walker (2 wheeled);None Transfers: Sit to/from Stand Sit to Stand: Min guard         General transfer comment: vc for safe hand placement with RW; groggy intially, however no LOB  Ambulation/Gait Ambulation/Gait assistance: Min guard Ambulation Distance (Feet): 10 Feet (5, 5) Assistive device: Rolling walker (2 wheeled);None Gait Pattern/deviations: Step-through pattern;Decreased stride length;Wide base of support     General Gait Details: vc for proper use of RW and to avoid objects on her Lt (required physical assist x 1 to avoid object); ambulation limited  by repeated need to use the toilet  Stairs            Wheelchair Mobility    Modified Rankin (Stroke Patients Only) Modified Rankin (Stroke Patients Only) Pre-Morbid Rankin Score: Moderate disability Modified Rankin: Moderately severe disability     Balance Overall balance assessment: Needs assistance Sitting-balance support: No upper extremity supported;Feet supported Sitting balance-Leahy Scale: Fair     Standing balance support: No upper extremity supported Standing balance-Leahy Scale: Fair Standing balance comment: Stood at sink to wash hands and brush teeth; used one UE support while preparing toothbrush and brushing teeth                 Standardized Balance Assessment Standardized Balance Assessment :  (unable to complete due to repeated use of toilet)           Pertinent Vitals/Pain Pain Assessment: No/denies pain    Home Living Family/patient expects to be discharged to:: Private residence Living Arrangements: Spouse/significant other Available Help at Discharge: Family;Available 24 hours/day (husband works 12 hr shifts; working on schedule with family ) Type of Home: House Home Access: Stairs to enter Entrance Stairs-Rails: Right;Left;Can reach both Secretary/administrator of Steps: 4 Home Layout: One level Home Equipment: Environmental consultant - 2 wheels;Cane - single point      Prior Function Level of Independence: Independent with assistive device(s)         Comments: husband reports uses RW inside and cane (+husband) when she goes out; he reports baseline memory deficits post prior CVA and morning confusion is not uncommon     Hand  Dominance   Dominant Hand: Right    Extremity/Trunk Assessment   Upper Extremity Assessment: Defer to OT evaluation           Lower Extremity Assessment: LLE deficits/detail   LLE Deficits / Details: pt unsure if it is weaker than usual (prior CVA); grossly 4-4+  Cervical / Trunk Assessment: Normal   Communication   Communication: Expressive difficulties  Cognition Arousal/Alertness: Lethargic (sleeping on arrival; improved alertness as mobilized) Behavior During Therapy: Flat affect Overall Cognitive Status: Impaired/Different from baseline Area of Impairment: Orientation;Memory;Awareness;Problem solving Orientation Level: Place;Situation   Memory: Decreased short-term memory     Awareness:  (pre-intellectual) Problem Solving: Slow processing;Difficulty sequencing;Requires verbal cues;Requires tactile cues General Comments: required re-orientation to place and situation x 3 (over 5 minutes) before pt could recall where she was and why; became distracted as bathing herself and began to use washcloth she had washed her perineum with to wash her legs    General Comments General comments (skin integrity, edema, etc.): Husband arrived at end of session; Pt remained in bathroom with nursing    Exercises        Assessment/Plan    PT Assessment Patient needs continued PT services  PT Diagnosis Hemiplegia non-dominant side   PT Problem List Decreased strength;Decreased balance;Decreased mobility;Decreased cognition;Decreased knowledge of use of DME;Decreased safety awareness;Decreased knowledge of precautions  PT Treatment Interventions DME instruction;Gait training;Stair training;Functional mobility training;Therapeutic activities;Balance training;Neuromuscular re-education;Cognitive remediation;Patient/family education   PT Goals (Current goals can be found in the Care Plan section) Acute Rehab PT Goals Patient Stated Goal: return home; does not want to "go to a home" PT Goal Formulation: With patient/family Time For Goal Achievement: 10/22/14 Potential to Achieve Goals: Good    Frequency Min 4X/week   Barriers to discharge Decreased caregiver support husband feels sure he can arrange 24 hr care    Co-evaluation               End of Session Equipment Utilized During  Treatment: Gait belt Activity Tolerance: Patient tolerated treatment well Patient left: with nursing/sitter in room (in bathroom) Nurse Communication: Mobility status         Time: 0821-0901 PT Time Calculation (min) (ACUTE ONLY): 40 min   Charges:   PT Evaluation $Initial PT Evaluation Tier I: 1 Procedure PT Treatments $Gait Training: 8-22 mins $Therapeutic Activity: 8-22 mins   PT G Codes:        Niccole Witthuhn October 18, 2014, 9:20 AM Pager [phone]

## 2014-10-15 NOTE — Interval H&P Note (Deleted)
History and Physical Interval Note:  10/15/2014 2:04 PM  Dawn Ho  has presented today for surgery, with the diagnosis of stroke  The various methods of treatment have been discussed with the patient and family. After consideration of risks, benefits and other options for treatment, the patient has consented to  Procedure(s): TRANSESOPHAGEAL ECHOCARDIOGRAM (TEE) (N/A) as a surgical intervention .  The patient's history has been reviewed, patient examined, no change in status, stable for surgery.  I have reviewed the patient's chart and labs.  Questions were answered to the patient's satisfaction.     Dietrich PatesPaula Bryer Gottsch

## 2014-10-15 NOTE — Progress Notes (Signed)
°   10/15/14 0949  Clinical Encounter Type  Visited With Patient;Family;Patient and family together  Visit Type Initial;Pre-op  Referral From Nurse  Spiritual Encounters  Spiritual Needs Emotional  Stress Factors  Patient Stress Factors None identified  Family Stress Factors None identified   Initial visit with patient before she goes for a procedure. Pt was sitting up and wanted a glass of water. Pt is NPO and I had to explain that she was unable to have anything until afterwards and her nurse approved it. Pt seemed a bit confused. Patient said that her son and other visitor were there to support her and that her son is a Programmer, multimediapreacher, so she is covered in prayer. Assessed the situation and pt was stable and alert when I left the room. Follow-up if patient requests.  Lorna Fewhristina Yarbrough, Chaplain Intern 10/15/2014, 10:00 AM

## 2014-10-15 NOTE — Progress Notes (Signed)
Physical Therapy Treatment Patient Details Name: Dawn FoilFaye O Ho MRN: [id number] DOB: 1950-10-16 Today's Date: 10/15/2014    History of Present Illness Adm with decr speech and Lt sided weakness; MRI + multiple acute/subacute infarcts (bil) largest Rt posterior frontal and Lt basal ganglia  PMHx- Rt CVA with residual Lt weakness; DM, HTN, fibromyalgiz    PT Comments    Notified by Dawn Ho, OT that upon her discussion with family they had realized that they cannot provide the 24 hour assistance that patient needs. They now feel short-term rehab would be the best plan for patient. Pt has family that works at [employer] (and she previously worked there) and hopes to be accepted there.  See updated recommendations below.   Follow Up Recommendations  SNF;Supervision/Assistance - 24 hour     Equipment Recommendations  None recommended by PT    Recommendations for Other Services       Precautions / Restrictions Precautions Precautions: Fall    Mobility  Bed Mobility                  Transfers          Ambulation/Gait                 Stairs            Wheelchair Mobility    Modified Rankin (Stroke Patients Only)       Balance                       Cognition                    Exercises      General Comments           Home Living Family/patient expects to be discharged to:: Private residence Living Arrangements: Spouse/significant other Available Help at Discharge: Family;Available PRN/intermittently Type of Home: House Home Access: Stairs to enter Entrance Stairs-Rails: Right;Left;Can reach both Home Layout: One level Home Equipment: Environmental consultantWalker - 2 wheels;Cane - single point Additional Comments: Spouse and children report they are not able to provide 24 hour assist as they work during the day     Prior Function Level of Independence: Needs assistance  Gait / Transfers Assistance Needed: mod I ADL's / Homemaking  Assistance Needed: Performed BADLs with min A - mod I.  Family reports she had "good days and bad days".  Spouse report he made pt stop driving ~ 3mos ago due to impaired safety.  Spouse and children report she has had a fluctuating status over the last 3 months  Comments: spouse and children report she has been fluctuating over the past 3 mos with cognition and ability to perform IADLs.     PT Goals (current goals can now be found in the care plan section) Acute Rehab PT Goals Patient Stated Goal: Pt did not state     Frequency  Min 3X/week    PT Plan Discharge plan needs to be updated    Co-evaluation             End of Session           Time:  -     Charges:                       G Codes:      Cavion Faiola 10/15/2014, 3:10 PM Pager [phone]

## 2014-10-15 NOTE — CV Procedure (Signed)
SURGEON:  Hillis RangeJames Juel Ripley, MD     PREPROCEDURE DIAGNOSIS:  Cryptogenic Stroke    POSTPROCEDURE DIAGNOSIS:  Cryptogenic Stroke     PROCEDURES:   1. Implantable loop recorder implantation    INTRODUCTION:  Donavan FoilFaye O Ho is a 64 y.o. female with a history of unexplained stroke who presents today for implantable loop implantation.  The patient has had a cryptogenic stroke.  Despite an extensive workup by neurology, no reversible causes have been identified.  she has worn telemetry during which she did not have arrhythmias.  There is significant concern for possible atrial fibrillation as the cause for the patients stroke.  The patient therefore presents today for implantable loop implantation.     DESCRIPTION OF PROCEDURE:  Informed written consent was obtained, and the patient was brought to the electrophysiology lab in a fasting state.  The patient required no sedation for the procedure today.  Mapping over the patient's chest was performed by the EP lab staff to identify the area where electrograms were most prominent for ILR recording.  This area was found to be the left parasternal region over the 3rd-4th intercostal space. The patients left chest was therefore prepped and draped in the usual sterile fashion by the EP lab staff. The skin overlying the left parasternal region was infiltrated with lidocaine for local analgesia.  A 0.5-cm incision was made over the left parasternal region over the 3rd intercostal space.  A subcutaneous ILR pocket was fashioned using a combination of sharp and blunt dissection.  A Medtronic Reveal [place]adaLinq model [id number] S implantable loop recorder was then placed into the pocket  R waves were very prominent and measured 0.2 mV. EBL<1 ml.  Steri- Strips and a sterile dressing were then applied.  There were no early apparent complications.     CONCLUSIONS:   1. Successful implantation of a Medtronic Reveal LINQ implantable loop recorder for cryptogenic stroke  2. No  early apparent complications.

## 2014-10-15 NOTE — Evaluation (Signed)
Occupational Therapy Evaluation Patient Details Name: Dawn Ho MRN: [id number] DOB: 1951-07-03 Today's Date: 10/15/2014    History of Present Illness Adm with decr speech and Lt sided weakness; MRI + multiple acute/subacute infarcts (bil) largest Rt posterior frontal and Lt basal ganglia  PMHx- Rt CVA with residual Lt weakness; DM, HTN, fibromyalgiz   Clinical Impression   Pt admitted with above. She demonstrates the below listed deficits and will benefit from continued OT to maximize safety and independence with BADLs.  Pt presents to OT with impaired cognition, questionable mild Lt inferior field deficit (pt attributes this to a cataract); impaired balance.   Currently, she requires min guard to min A with BADLs.   Family reports she has had a 3 mos decline with many day to day fluctuations.  She will need 24 hour supervision at discharge.  Family reports they are unable to provide this due to work schedules, therefore, recommend short term SNF.  Pt and family are in agreement.        Follow Up Recommendations  SNF;Supervision/Assistance - 24 hour    Equipment Recommendations  None recommended by OT    Recommendations for Other Services       Precautions / Restrictions Precautions Precautions: Fall      Mobility Bed Mobility                  Transfers Overall transfer level: Needs assistance Equipment used: Rolling walker (2 wheeled);None Transfers: Sit to/from [employer] to Stand: Min guard Stand pivot transfers: Min guard       General transfer comment: min guard for balance and safety     Balance Overall balance assessment: Needs assistance Sitting-balance support: Feet supported Sitting balance-Leahy Scale: Fair     Standing balance support: During functional activity Standing balance-Leahy Scale: Fair                              ADL Overall ADL's : Needs assistance/impaired Eating/Feeding: Set up;Sitting    Grooming: Wash/dry hands;Wash/dry face;Oral care;Brushing hair;Min guard;Standing   Upper Body Bathing: Supervision/ safety;Sitting   Lower Body Bathing: Min guard;Sit to/from stand   Upper Body Dressing : Minimal assistance;Sitting   Lower Body Dressing: Min guard;Sit to/from stand   Toilet Transfer: Min guard;Ambulation;Comfort height toilet   Toileting- Clothing Manipulation and Hygiene: Min guard;Sit to/from stand       Functional mobility during ADLs: Min guard;Rolling walker General ADL Comments: Pt requires min guard for balance and safety.  She requires cues for attention and problem solving      Vision Vision Assessment?: Yes Eye Alignment: Within Functional Limits Ocular Range of Motion: Within Functional Limits Alignment/Gaze Preference: Within Defined Limits Tracking/Visual Pursuits: Able to track stimulus in all quads without difficulty Visual Fields: Other (comment) Additional Comments: Pt with very small area of apparent Lt inferior field deficit.  She attributes this to needing to have cataract removed    Perception Perception Perception Tested?: Yes   Praxis      Pertinent Vitals/Pain Pain Assessment: No/denies pain     Hand Dominance Right   Extremity/Trunk Assessment Upper Extremity Assessment Upper Extremity Assessment: LUE deficits/detail LUE Deficits / Details: grossly 4/5    Lower Extremity Assessment Lower Extremity Assessment: Defer to PT evaluation   Cervical / Trunk Assessment Cervical / Trunk Assessment: Normal   Communication Communication Communication: Expressive difficulties   Cognition Arousal/Alertness: Awake/alert Behavior During Therapy: Flat affect Overall  Cognitive Status: Impaired/Different from baseline       Memory: Decreased short-term memory     Awareness: Intellectual Problem Solving: Slow processing;Decreased initiation;Difficulty sequencing;Requires verbal cues;Requires tactile cues General Comments: Pt  requires cues to initiate self care tasks at times (inconsistent). She is slow to process information.  Requires mod cues for walker safety.   mod A for problem solving    General Comments       Exercises       Shoulder Instructions      Home Living Family/patient expects to be discharged to:: Private residence Living Arrangements: Spouse/significant other Available Help at Discharge: Family;Available PRN/intermittently (husband works 12 hr shifts; working on schedule with family ) Type of Home: House Home Access: Stairs to enter Secretary/administrator of Steps: 4 Entrance Stairs-Rails: Right;Left;Can reach both Home Layout: One level     Bathroom Shower/Tub: Tub/shower unit;Curtain Shower/tub characteristics: Industrial/product designer: Yes How Accessible: Accessible via walker Home Equipment: Walker - 2 wheels;Cane - single point   Additional Comments: Spouse and children report they are not able to provide 24 hour assist as they work during the day       Prior Functioning/Environment Level of Independence: Needs assistance  Gait / Transfers Assistance Needed: mod I ADL's / Homemaking Assistance Needed: Performed BADLs with min A - mod I.  Family reports she had "good days and bad days".  Spouse report he made pt stop driving ~ 3mos ago due to impaired safety.  Spouse and children report she has had a fluctuating status over the last 3 months    Comments: spouse and children report she has been fluctuating over the past 3 mos with cognition and ability to perform IADLs.      OT Diagnosis: Generalized weakness;Cognitive deficits;Disturbance of vision   OT Problem List: Decreased strength;Decreased activity tolerance;Impaired balance (sitting and/or standing);Impaired vision/perception;Decreased coordination;Decreased cognition;Decreased safety awareness;Decreased knowledge of use of DME or AE;Impaired UE functional use   OT Treatment/Interventions: Self-care/ADL  training;Therapeutic exercise;DME and/or AE instruction;Therapeutic activities;Cognitive remediation/compensation;Visual/perceptual remediation/compensation;Patient/family education;Balance training    OT Goals(Current goals can be found in the care plan section) Acute Rehab OT Goals Patient Stated Goal: Pt did not state  OT Goal Formulation: With patient/family Time For Goal Achievement: 10/22/14 Potential to Achieve Goals: Good ADL Goals Pt Will Perform Grooming: with supervision;standing Pt Will Perform Upper Body Bathing: with supervision;standing Pt Will Perform Lower Body Bathing: with supervision;sit to/from stand Pt Will Perform Upper Body Dressing: with supervision;standing Pt Will Perform Lower Body Dressing: with supervision;sit to/from stand Pt Will Transfer to Toilet: with supervision;ambulating;regular height toilet Pt Will Perform Toileting - Clothing Manipulation and hygiene: with supervision;sit to/from stand  OT Frequency: Min 2X/week   Barriers to D/C: Decreased caregiver support          Co-evaluation              End of Session Equipment Utilized During Treatment: Rolling walker Nurse Communication: Mobility status  Activity Tolerance: Patient tolerated treatment well Patient left: in chair;with call bell/phone within reach;with chair alarm set;with family/visitor present   Time: 4742-5956 OT Time Calculation (min): 34 min Charges:  OT General Charges $OT Visit: 1 Procedure OT Evaluation $Initial OT Evaluation Tier I: 1 Procedure OT Treatments $Self Care/Home Management : 8-22 mins G-Codes:    Shaniece Bussa M 17-Oct-2014, 1:44 PM

## 2014-10-15 NOTE — Progress Notes (Signed)
  Echocardiogram 2D Echocardiogram has been performed.  Arvil ChacoFoster, Dantre Yearwood 10/15/2014, 2:47 PM

## 2014-10-15 NOTE — H&P (View-Only) (Signed)
CARDIOLOGY CONSULT NOTE   Patient ID: Dawn Ho MRN: [id number] DOB/AGE: 1950/12/14 64 y.o.  Admit date: 10/13/2014  Primary Physician   No PCP Per Patient Primary Cardiologist   New (seen by Dr. Gala Romney in the past)  Reason for Consultation   Possible Afib and CVA  HPI: Dawn Ho is a 64 y.o. 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 MRI showed multiple punctate acute/subacute infarcts of varying ages. Largest cluster of infarcts in the posterior right frontal lobe compatible with left-sided numbness and weakness. Family reported a possible remote hx of afib and cardiology was consulted on TEE vs just starting [facility].  She has never followed with a cardiologist. Patient has been seen by Dr. Gala Romney in 2009 for an admission for chest pain. She ruled out and was referred for outpatient stress test. She also complained of chest pain again in 2012 and was referred by Dr. Earnestine Leys for nuclear stress test, but it does not look like this was ever completed. No prior cardiac history otherwise. Family in room says they remember something about an irregular heart beat diagnosis in the 1980s. The patient/family reports that she often gets chest pain, palpitations and SOB but only when she gets emotionally upset about something. No exertional CP or SOB but she is not very active. Patient is a poor historian and answers yes to most questions. Family is very helpful in filling in details.      Past Medical History  Diagnosis Date  . Fibromyalgia   . Diabetes mellitus   . Hypertension   . Dyslipidemia   . TIA (transient ischemic attack)   . Depression with anxiety   . CVA (cerebral infarction)      Past Surgical History  Procedure Laterality Date  . Cholecystectomy    . Cesarean section       Allergies  Allergen Reactions  . Ivp Dye [Iodinated Diagnostic Agents]   . Penicillins Swelling and Rash    I have reviewed the patient's current medications . aspirin  325 mg Oral Daily  . enoxaparin (LOVENOX) injection  40 mg Subcutaneous Q24H  . furosemide  60 mg Oral Daily  . gabapentin  300 mg Oral BID  . insulin aspart  8 Units Subcutaneous TID WC  . insulin glargine  55 Units Subcutaneous QHS  . levothyroxine  125 mcg Oral QAC breakfast  . LORazepam  1 mg Oral BID  . losartan  100 mg Oral Daily  . meloxicam  7.5 mg Oral Daily  . metoprolol succinate  150 mg Oral Daily  . pantoprazole  40 mg Oral Daily  . PARoxetine  20 mg Oral Daily  . [START ON 10/15/2014] pneumococcal 23 valent vaccine  0.5 mL Intramuscular Tomorrow-1000  . potassium chloride  20 mEq Oral Daily     hydrALAZINE, HYDROcodone-acetaminophen, senna-docusate, traMADol  Prior to Admission medications   Medication Sig Start Date End Date Taking? Authorizing Provider  acetaminophen (TYLENOL) 500 MG tablet Take 500 mg by mouth every 6 (six) hours as needed.     Yes Historical Provider, MD  aspirin EC 81 MG tablet Take 81 mg by mouth daily.     Yes Historical Provider, MD  cyanocobalamin (,VITAMIN B-12,) 1000 MCG/ML injection Inject 1,000 mcg into the muscle every 30 (thirty) days.     Yes Historical Provider,  MD  ergocalciferol (VITAMIN D2) 50000 UNITS capsule Take 50,000 Units by mouth once a week. Monday   Yes Historical Provider, MD  furosemide (LASIX) 40 MG tablet Take 60 mg by mouth daily.    Yes Historical Provider, MD  gabapentin (NEURONTIN) 300 MG capsule Take 300 mg by mouth 2 (two) times daily.     Yes Historical Provider, MD  glimepiride (AMARYL) 2 MG tablet Take 2 mg by mouth daily with breakfast.   Yes Historical Provider, MD  glipiZIDE (GLUCOTROL XL) 10 MG 24 hr tablet Take 10 mg by mouth daily.     Yes Historical Provider, MD  insulin glargine (LANTUS) 100 UNIT/ML injection Inject 62 Units into  the skin at bedtime.   Yes Historical Provider, MD  insulin regular (NOVOLIN R,HUMULIN R) 100 units/mL injection Inject 12 Units into the skin 3 (three) times daily before meals.    Yes Historical Provider, MD  levothyroxine (SYNTHROID, LEVOTHROID) 125 MCG tablet Take 125 mcg by mouth daily.     Yes Historical Provider, MD  LORazepam (ATIVAN) 1 MG tablet Take 1 mg by mouth 2 (two) times daily.     Yes Historical Provider, MD  losartan (COZAAR) 100 MG tablet Take 100 mg by mouth daily.     Yes Historical Provider, MD  meloxicam (MOBIC) 7.5 MG tablet Take 7.5 mg by mouth daily.     Yes Historical Provider, MD  metoprolol (TOPROL-XL) 100 MG 24 hr tablet Take 150 mg by mouth daily.     Yes Historical Provider, MD  Multiple Vitamins-Minerals (MULTIVITAMIN WITH MINERALS) tablet Take 1 tablet by mouth daily.   Yes Historical Provider, MD  omeprazole (PRILOSEC) 20 MG capsule Take 20 mg by mouth daily.     Yes Historical Provider, MD  PARoxetine (PAXIL) 20 MG tablet Take 20 mg by mouth every morning.     Yes Historical Provider, MD  potassium chloride SA (K-DUR,KLOR-CON) 20 MEQ tablet Take 20 mEq by mouth daily.     Yes Historical Provider, MD     History   Social History  . Marital Status: Married    Spouse Name: N/A  . Number of Children: N/A  . Years of Education: N/A   Occupational History  . Not on file.   Social History Main Topics  . Smoking status: Never Smoker   . Smokeless tobacco: Never Used  . Alcohol Use: No  . Drug Use: No  . Sexual Activity: Not on file   Other Topics Concern  . Not on file   Social History Narrative    No family status information on file.   Family History  Problem Relation Age of Onset  . Heart disease Father      ROS:  Full 14 point review of systems complete and found to be negative unless listed above.  Physical Exam: Blood pressure 153/72, pulse 61, temperature 98.7 F (37.1 C), temperature source Oral, resp. rate 18, height 5' (1.524 m),  weight 229 lb 1.6 oz (103.919 kg), SpO2 95 %.  General: Well developed, well nourished, female in no acute distress Head: Eyes PERRLA, No xanthomas.   Normocephalic and atraumatic, oropharynx without edema or exudate. Dentition:  Lungs:  Heart: HRRR S1 S2, no rub/gallop, Heart irregular rate and rhythm with S1, S2  murmur. pulses are 2+ extrem.   Neck: No carotid bruits. No lymphadenopathy.  JVD. Abdomen: Bowel sounds present, abdomen soft and non-tender without masses or hernias noted. Msk:  No spine or cva tenderness. No weakness,  no joint deformities or effusions. Extremities: No clubbing or cyanosis.  edema.  Neuro: Alert and oriented X 3. No focal deficits noted. Psych:  Good affect, responds appropriately Skin: No rashes or lesions noted.  Labs:   Lab Results  Component Value Date   WBC 9.0 10/13/2014   HGB 15.6* 10/13/2014   HCT 46.0 10/13/2014   MCV 93.2 10/13/2014   PLT 197 10/13/2014    Recent Labs  10/13/14 1500  INR 0.92    Recent Labs Lab 10/13/14 1500 10/13/14 1532  NA 142 142  K 3.8 3.7  CL 102 101  CO2 28  --   BUN 16 18  CREATININE 0.83 0.90  CALCIUM 10.4  --   PROT 7.3  --   BILITOT 1.0  --   ALKPHOS 89  --   ALT 27  --   AST 42*  --   GLUCOSE 148* 151*  ALBUMIN 3.9  --      Recent Labs  10/13/14 1519  TROPIPOC 0.00   No results found for: PROBNP Lab Results  Component Value Date   CHOL 207* 10/14/2014   HDL 42 10/14/2014   LDLCALC 93 10/14/2014   TRIG 358* 10/14/2014   Echo: Study Date: 10/14/2014 LV EF: 55% -  60% Study Conclusions - Left ventricle: The cavity size was normal. Systolic function was normal. The estimated ejection fraction was in the range of 55% to 60%. Wall motion was normal; there were no regional wall motion abnormalities. Features are consistent with a pseudonormal left ventricular filling pattern, with concomitant abnormal relaxation and increased filling pressure (grade 2  diastolic dysfunction).  ECG:  HR 104 Sinus tachycardia Incomplete RBBB and LAFB Consider right ventricular hypertrophy LVH with secondary repolarization abnormality Borderline prolonged QT interval No significant change since last tracing   Radiology:  Ct Head Wo Contrast  10/13/2014   CLINICAL DATA:  64 year old female who awoke at 1000 hours with left side weakness, left facial droop, eighth aphasia, headache. Initial encounter.  EXAM: CT HEAD WITHOUT CONTRAST  TECHNIQUE: Contiguous axial images were obtained from the base of the skull through the vertex without intravenous contrast.  COMPARISON:  [facility] non contrast head CT 11/17/2010.  FINDINGS: Visualized paranasal sinuses and mastoids are clear. Calcified atherosclerosis at the skull base. No acute osseous abnormality identified. Stable and negative orbit and scalp soft tissues.  Progressed Patchy and confluent bilateral cerebral white matter hypodensity,. Chronic appearing lacunar infarcts affecting the deep gray matter nuclei. That involving the anterior left thalamus appears to of been present in 2012. A large but chronic appearing left basal ganglia lacunar infarct is new since that time.  No superimposed acute cortically based infarct identified. No midline shift, mass effect, or evidence of intracranial mass lesion. No ventriculomegaly. No acute intracranial hemorrhage identified. No suspicious intracranial vascular hyperdensity.  IMPRESSION: Progressed chronic small vessel ischemia since 2012. No acute cortically based infarct or acute intracranial hemorrhage identified.   Electronically Signed   By: Odessa Fleming M.D.   On: 10/13/2014 16:34   Mr [name] Head Wo Contrast  10/13/2014   CLINICAL DATA:  Left-sided weakness and slurred speech. Symptoms for 3 weeks. Facial droop and occipital headache.  EXAM: MRI HEAD WITHOUT CONTRAST  MRA HEAD WITHOUT CONTRAST  TECHNIQUE: Multiplanar, multiecho pulse sequences of the brain and  surrounding structures were obtained without intravenous contrast. Angiographic images of the head were obtained using MRA technique without contrast.  COMPARISON:  CT head without contrast 10/13/2014.  FINDINGS: MRI HEAD FINDINGS  At least 9 separate foci of restricted diffusion are present throughout both cerebral hemispheres. A lesion in the superior temporal gyrus demonstrates distinctly restricted diffusion. A lesion in the anterior right frontal lobe is more indistinct. Other lesions are along this spectrum, suggesting infarcts of the areas acute/subacute age over the last 1-2 weeks. The largest focus of lesions is in the posterior right frontal lobe. There are lesions in the posterior left frontal lobe white matter as well. There is no associated hemorrhage. T2 changes are associated.  A remote infarct involves the anterior portion of the left lentiform nucleus. There are remote lacunar infarcts in the thalami bilaterally. Remote white matter lacunar infarcts are present adjacent to the atrium of the lateral ventricles bilaterally. Confluent periventricular and subcortical white matter changes are evident otherwise as well.  There is no acute hemorrhage or mass lesion. Ex vacuo dilation is noted in the left lateral ventricle associated with the remote basal ganglia infarcts.  Flow is present in the major intracranial arteries. The right lens is replaced. The globes and orbits are otherwise intact. The paranasal sinuses are clear. There is minimal fluid in the left mastoid air cells. No obstructing nasopharyngeal lesion is evident.  MRA HEAD FINDINGS  Mild atherosclerotic irregularity is present within the cavernous internal carotid arteries bilaterally. The study is mildly degraded by motion. There is mild narrowing of proximal right A1 segment. The A1 and M1 segments are otherwise normal. The anterior communicating artery is patent. A high-grade stenosis is present in the mass posterior right M2 segment and  the most anterior right left M2 segment. There is marked attenuation of MCA branch vessels bilaterally.  The vertebral arteries are codominant. There is moderate stenosis of the proximal right PICA. The left PICA origin is visualized and normal. The basilar artery is normal. Both posterior cerebral arteries originate from the basilar tip. Moderate attenuation of PCA branch vessels is present bilaterally, worse on the left. There is a mild distal right P2 segment stenosis.  IMPRESSION: 1. Multiple punctate acute/subacute infarcts of varying ages compatible with a history of symptoms over the last 3 weeks. The largest cluster of infarcts is in the posterior right frontal lobe, compatible with left-sided numbness and weakness. 2. Other more remote infarcts are present within the left greater than right basal ganglia and thalami as well as within the deep white matter. 3. Extensive small vessel white matter changes are present bilaterally. 4. Extensive branch vessel stenoses and small vessel disease in both the anterior and posterior circulation. 5. Prominent proximal stenoses in M2 branches bilaterally. 6. No significant stenosis of the medium or large intracranial vessels proximal to the MCA bifurcations. These results were called by telephone at the time of interpretation on 10/13/2014 at 840 pm: To Georga Hacking, RN, who verbally acknowledged these results.   Electronically Signed   By: Marin Roberts M.D.   On: 10/13/2014 20:40   Mr Brain Wo Contrast  10/13/2014   CLINICAL DATA:  Left-sided weakness and slurred speech. Symptoms for 3 weeks. Facial droop and occipital headache.  EXAM: MRI HEAD WITHOUT CONTRAST  MRA HEAD WITHOUT CONTRAST  TECHNIQUE: Multiplanar, multiecho pulse sequences of the brain and surrounding structures were obtained without intravenous contrast. Angiographic images of the head were obtained using MRA technique without contrast.  COMPARISON:  CT head without contrast 10/13/2014.   FINDINGS: MRI HEAD FINDINGS  At least 9 separate foci of restricted diffusion are present throughout both cerebral hemispheres. A lesion  in the superior temporal gyrus demonstrates distinctly restricted diffusion. A lesion in the anterior right frontal lobe is more indistinct. Other lesions are along this spectrum, suggesting infarcts of the areas acute/subacute age over the last 1-2 weeks. The largest focus of lesions is in the posterior right frontal lobe. There are lesions in the posterior left frontal lobe white matter as well. There is no associated hemorrhage. T2 changes are associated.  A remote infarct involves the anterior portion of the left lentiform nucleus. There are remote lacunar infarcts in the thalami bilaterally. Remote white matter lacunar infarcts are present adjacent to the atrium of the lateral ventricles bilaterally. Confluent periventricular and subcortical white matter changes are evident otherwise as well.  There is no acute hemorrhage or mass lesion. Ex vacuo dilation is noted in the left lateral ventricle associated with the remote basal ganglia infarcts.  Flow is present in the major intracranial arteries. The right lens is replaced. The globes and orbits are otherwise intact. The paranasal sinuses are clear. There is minimal fluid in the left mastoid air cells. No obstructing nasopharyngeal lesion is evident.  MRA HEAD FINDINGS  Mild atherosclerotic irregularity is present within the cavernous internal carotid arteries bilaterally. The study is mildly degraded by motion. There is mild narrowing of proximal right A1 segment. The A1 and M1 segments are otherwise normal. The anterior communicating artery is patent. A high-grade stenosis is present in the mass posterior right M2 segment and the most anterior right left M2 segment. There is marked attenuation of MCA branch vessels bilaterally.  The vertebral arteries are codominant. There is moderate stenosis of the proximal right PICA. The  left PICA origin is visualized and normal. The basilar artery is normal. Both posterior cerebral arteries originate from the basilar tip. Moderate attenuation of PCA branch vessels is present bilaterally, worse on the left. There is a mild distal right P2 segment stenosis.  IMPRESSION: 1. Multiple punctate acute/subacute infarcts of varying ages compatible with a history of symptoms over the last 3 weeks. The largest cluster of infarcts is in the posterior right frontal lobe, compatible with left-sided numbness and weakness. 2. Other more remote infarcts are present within the left greater than right basal ganglia and thalami as well as within the deep white matter. 3. Extensive small vessel white matter changes are present bilaterally. 4. Extensive branch vessel stenoses and small vessel disease in both the anterior and posterior circulation. 5. Prominent proximal stenoses in M2 branches bilaterally. 6. No significant stenosis of the medium or large intracranial vessels proximal to the MCA bifurcations. These results were called by telephone at the time of interpretation on 10/13/2014 at 840 pm: To Georga Hacking, RN, who verbally acknowledged these results.   Electronically Signed   By: Marin Roberts M.D.   On: 10/13/2014 20:40    ASSESSMENT AND PLAN:    Principal Problem:   CVA (cerebral infarction) Active Problems:   Fibromyalgia   Diabetes mellitus   Depression with anxiety   Obesity   Hyperlipidemia   CVA (cerebral vascular accident)   Dawn Ho is a 64 y.o. 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 MRI showed multiple punctate acute/subacute infarcts of varying ages. Largest cluster of infarcts in the posterior right frontal lobe compatible with left-sided numbness and weakness. Family reported a  possible remote hx of afib and cardiology was  consulted on TEE vs just starting AC.  Bilateral punctate acute/subacute infarcts- patient has a possible remote history of A. Fib. -- 2D ECHO today with normal LV function, no RWMA and G2DD.  -- No afib on tele and I have not seen any documentation of afib in past notes. Will plan for TEE with possible LOOP recorder placement tomorrow. NPO after midnight.   Hypertension- blood pressure is stable, continue metoprolol.  Diabetes mellitus- continue Lantus and sliding scale insulin  Depression with anxiety- continue Ativan and Paxil  Hypothyroidism-continue Synthroid  SignedAllena Katz 10/14/2014 5:33 PM  Pager [phone]  Co-Sign MD   The patient was seen, examined and discussed with Carlean Jews, PA-C and I agree with the above.   64 year old female with h/o chest pain, multiple consults in the past with no follow up, no ischemic work up who presented with CVA with multiple multiple punctate acute/subacute infarcts on brain MRI. THe patient is a poor historian, however the family reports remote h/o paroxysmal atrial fibrillation, never documented. No a-fib on telemetry. Echocardiogram shows preserved LVEF 55-60% and normal left atrial size. We will plan on TEE to rule out intracardiac source of embolism and if negative plan for implantation of an loop recorder.  For now I would just continue aspirin and plavix as we have no evidence of atrial fibrillation.   Lars Masson 10/14/2014

## 2014-10-15 NOTE — Interval H&P Note (Signed)
History and Physical Interval Note:  10/15/2014 11:11 AM  Dawn Ho  has presented today for surgery, with the diagnosis of stroke  The various methods of treatment have been discussed with the patient and family. After consideration of risks, benefits and other options for treatment, the patient has consented to  Procedure(s): TRANSESOPHAGEAL ECHOCARDIOGRAM (TEE) (N/A) as a surgical intervention .  The patient's history has been reviewed, patient examined, no change in status, stable for surgery.  I have reviewed the patient's chart and labs.  Questions were answered to the patient's satisfaction.     Dietrich PatesPaula Ronia Hazelett

## 2014-10-15 NOTE — Progress Notes (Signed)
[facility] PROGRESS NOTE  Dawn Ho OHY:[id number] DOB: [birth year] DOA: 10/13/2014 PCP: No PCP Per Patient  Assessment/Plan: 1. Bilateral punctate acute/subacute infarcts- patient has remote history of A. fib. She has been started on aspirin 325 mg by mouth daily. Discussed with neurology, Dr. Pearlean Brownie recommended patient be seen by cardiology for further evaluation regarding TEE versus starting anticoagulation. Cardiology saw the patient and plan is for TEE today. 2. Somnolence- family concerned with excessive somnolence after taking medications. Will change gabapentin to 300 mg by mouth daily at bedtime, 3. Hypertension- blood pressure is stable, will hold metoprolol for permissive hypertension . 4. Diabetes mellitus- continue Lantus and sliding scale insulin 5. Depression with anxiety- continue Ativan and Paxil 6. Hypothyroidism-continue Synthroid 7. DVT prophylaxis- Lovenox  Code Status: Full code Family Communication: *No family at bedside Disposition Plan: *Home when stable   Consultants:  *Neurology  Procedures:  None  Antibiotics:  None  HPI/Subjective: 64 y.o. 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 She woke up this morning with a posterior headache. Her husband noted that she was slurring her words and she had a left-sided facial droop. He does state that this has been on and off for about 3 weeks now but this morning it just didn't resolve and therefore he brought her into the ER. In the ER the symptoms persisted and I was called to do her admission for CVA. By the time I walked into the room the family states her symptoms had resolved. She has been taking a baby aspirin every day that has not had her Lipitor. She and admits to the fact that she has run out of some of her medications including Toprol Prilosec and  Lipitor. She is in between doctors and because it took so long to transfer records from one doctor to another doctor and the fact that she has not been able to see the new doctor yet, neither of them aren't willing to prescribe her medicines for her.  MRI showed multiple punctate acute/subacute infarcts of varying ages him better with a history of symptoms over the past 3 weeks. Largest cluster of infarcts in the posterior right frontal lobe compatible with left-sided numbness and weakness.  Today patient family concerned that patient gets excessive somnolence after taking medication in the morning.   Objective: Filed Vitals:   10/15/14 1439  BP: 175/96  Pulse:   Temp: 97.7 F (36.5 C)  Resp: 16   No intake or output data in the 24 hours ending 10/15/14 1454 Filed Weights   10/13/14 1930  Weight: 103.919 kg (229 lb 1.6 oz)    Exam:   General:  Appearing no acute distress  Cardiovascular: S1-S2 regular  Respiratory: Clear to auscultation bilaterally  Abdomen: Soft nontender no organomegaly  Musculoskeletal: No edema  Neuro- alert and oriented 3, mild weakness of left upper and lower extremity  Data Reviewed: Basic Metabolic Panel:  Recent Labs Lab 10/13/14 1500 10/13/14 1532  NA 142 142  K 3.8 3.7  CL 102 101  CO2 28  --   GLUCOSE 148* 151*  BUN 16 18  CREATININE 0.83 0.90  CALCIUM 10.4  --    Liver Function Tests:  Recent Labs Lab 10/13/14 1500  AST 42*  ALT 27  ALKPHOS 89  BILITOT 1.0  PROT 7.3  ALBUMIN 3.9   No results for input(s): LIPASE, AMYLASE in  the last 168 hours. No results for input(s): AMMONIA in the last 168 hours. CBC:  Recent Labs Lab 10/13/14 1500 10/13/14 1532  WBC 9.0  --   NEUTROABS 5.3  --   HGB 14.2 15.6*  HCT 42.8 46.0  MCV 93.2  --   PLT 197  --    Cardiac Enzymes: No results for input(s): CKTOTAL, CKMB, CKMBINDEX, TROPONINI in the last 168 hours. BNP (last 3 results) No results for input(s): BNP in the last  8760 hours.  ProBNP (last 3 results) No results for input(s): PROBNP in the last 8760 hours.  CBG:  Recent Labs Lab 10/14/14 1213 10/14/14 1701 10/14/14 2212 10/15/14 0629 10/15/14 1117  GLUCAP 209* 211* 183* 201* 209*    No results found for this or any previous visit (from the past 240 hour(s)).   Studies: Ct Head Wo Contrast  10/13/2014   CLINICAL DATA:  64 year old female who awoke at 1000 hours with left side weakness, left facial droop, eighth aphasia, headache. Initial encounter.  EXAM: CT HEAD WITHOUT CONTRAST  TECHNIQUE: Contiguous axial images were obtained from the base of the skull through the vertex without intravenous contrast.  COMPARISON:  [facility] non contrast head CT 11/17/2010.  FINDINGS: Visualized paranasal sinuses and mastoids are clear. Calcified atherosclerosis at the skull base. No acute osseous abnormality identified. Stable and negative orbit and scalp soft tissues.  Progressed Patchy and confluent bilateral cerebral white matter hypodensity,. Chronic appearing lacunar infarcts affecting the deep gray matter nuclei. That involving the anterior left thalamus appears to of been present in 2012. A large but chronic appearing left basal ganglia lacunar infarct is new since that time.  No superimposed acute cortically based infarct identified. No midline shift, mass effect, or evidence of intracranial mass lesion. No ventriculomegaly. No acute intracranial hemorrhage identified. No suspicious intracranial vascular hyperdensity.  IMPRESSION: Progressed chronic small vessel ischemia since 2012. No acute cortically based infarct or acute intracranial hemorrhage identified.   Electronically Signed   By: Odessa Fleming M.D.   On: 10/13/2014 16:34   Mr [name] Head Wo Contrast  10/13/2014   CLINICAL DATA:  Left-sided weakness and slurred speech. Symptoms for 3 weeks. Facial droop and occipital headache.  EXAM: MRI HEAD WITHOUT CONTRAST  MRA HEAD WITHOUT CONTRAST   TECHNIQUE: Multiplanar, multiecho pulse sequences of the brain and surrounding structures were obtained without intravenous contrast. Angiographic images of the head were obtained using MRA technique without contrast.  COMPARISON:  CT head without contrast 10/13/2014.  FINDINGS: MRI HEAD FINDINGS  At least 9 separate foci of restricted diffusion are present throughout both cerebral hemispheres. A lesion in the superior temporal gyrus demonstrates distinctly restricted diffusion. A lesion in the anterior right frontal lobe is more indistinct. Other lesions are along this spectrum, suggesting infarcts of the areas acute/subacute age over the last 1-2 weeks. The largest focus of lesions is in the posterior right frontal lobe. There are lesions in the posterior left frontal lobe white matter as well. There is no associated hemorrhage. T2 changes are associated.  A remote infarct involves the anterior portion of the left lentiform nucleus. There are remote lacunar infarcts in the thalami bilaterally. Remote white matter lacunar infarcts are present adjacent to the atrium of the lateral ventricles bilaterally. Confluent periventricular and subcortical white matter changes are evident otherwise as well.  There is no acute hemorrhage or mass lesion. Ex vacuo dilation is noted in the left lateral ventricle associated with the remote basal ganglia infarcts.  Flow is present in the major intracranial arteries. The right lens is replaced. The globes and orbits are otherwise intact. The paranasal sinuses are clear. There is minimal fluid in the left mastoid air cells. No obstructing nasopharyngeal lesion is evident.  MRA HEAD FINDINGS  Mild atherosclerotic irregularity is present within the cavernous internal carotid arteries bilaterally. The study is mildly degraded by motion. There is mild narrowing of proximal right A1 segment. The A1 and M1 segments are otherwise normal. The anterior communicating artery is patent. A  high-grade stenosis is present in the mass posterior right M2 segment and the most anterior right left M2 segment. There is marked attenuation of MCA branch vessels bilaterally.  The vertebral arteries are codominant. There is moderate stenosis of the proximal right PICA. The left PICA origin is visualized and normal. The basilar artery is normal. Both posterior cerebral arteries originate from the basilar tip. Moderate attenuation of PCA branch vessels is present bilaterally, worse on the left. There is a mild distal right P2 segment stenosis.  IMPRESSION: 1. Multiple punctate acute/subacute infarcts of varying ages compatible with a history of symptoms over the last 3 weeks. The largest cluster of infarcts is in the posterior right frontal lobe, compatible with left-sided numbness and weakness. 2. Other more remote infarcts are present within the left greater than right basal ganglia and thalami as well as within the deep white matter. 3. Extensive small vessel white matter changes are present bilaterally. 4. Extensive branch vessel stenoses and small vessel disease in both the anterior and posterior circulation. 5. Prominent proximal stenoses in M2 branches bilaterally. 6. No significant stenosis of the medium or large intracranial vessels proximal to the MCA bifurcations. These results were called by telephone at the time of interpretation on 10/13/2014 at 840 pm: To Georga Hacking, RN, who verbally acknowledged these results.   Electronically Signed   By: Marin Roberts M.D.   On: 10/13/2014 20:40   Mr Brain Wo Contrast  10/13/2014   CLINICAL DATA:  Left-sided weakness and slurred speech. Symptoms for 3 weeks. Facial droop and occipital headache.  EXAM: MRI HEAD WITHOUT CONTRAST  MRA HEAD WITHOUT CONTRAST  TECHNIQUE: Multiplanar, multiecho pulse sequences of the brain and surrounding structures were obtained without intravenous contrast. Angiographic images of the head were obtained using MRA technique  without contrast.  COMPARISON:  CT head without contrast 10/13/2014.  FINDINGS: MRI HEAD FINDINGS  At least 9 separate foci of restricted diffusion are present throughout both cerebral hemispheres. A lesion in the superior temporal gyrus demonstrates distinctly restricted diffusion. A lesion in the anterior right frontal lobe is more indistinct. Other lesions are along this spectrum, suggesting infarcts of the areas acute/subacute age over the last 1-2 weeks. The largest focus of lesions is in the posterior right frontal lobe. There are lesions in the posterior left frontal lobe white matter as well. There is no associated hemorrhage. T2 changes are associated.  A remote infarct involves the anterior portion of the left lentiform nucleus. There are remote lacunar infarcts in the thalami bilaterally. Remote white matter lacunar infarcts are present adjacent to the atrium of the lateral ventricles bilaterally. Confluent periventricular and subcortical white matter changes are evident otherwise as well.  There is no acute hemorrhage or mass lesion. Ex vacuo dilation is noted in the left lateral ventricle associated with the remote basal ganglia infarcts.  Flow is present in the major intracranial arteries. The right lens is replaced. The globes and orbits are otherwise intact. The  paranasal sinuses are clear. There is minimal fluid in the left mastoid air cells. No obstructing nasopharyngeal lesion is evident.  MRA HEAD FINDINGS  Mild atherosclerotic irregularity is present within the cavernous internal carotid arteries bilaterally. The study is mildly degraded by motion. There is mild narrowing of proximal right A1 segment. The A1 and M1 segments are otherwise normal. The anterior communicating artery is patent. A high-grade stenosis is present in the mass posterior right M2 segment and the most anterior right left M2 segment. There is marked attenuation of MCA branch vessels bilaterally.  The vertebral arteries are  codominant. There is moderate stenosis of the proximal right PICA. The left PICA origin is visualized and normal. The basilar artery is normal. Both posterior cerebral arteries originate from the basilar tip. Moderate attenuation of PCA branch vessels is present bilaterally, worse on the left. There is a mild distal right P2 segment stenosis.  IMPRESSION: 1. Multiple punctate acute/subacute infarcts of varying ages compatible with a history of symptoms over the last 3 weeks. The largest cluster of infarcts is in the posterior right frontal lobe, compatible with left-sided numbness and weakness. 2. Other more remote infarcts are present within the left greater than right basal ganglia and thalami as well as within the deep white matter. 3. Extensive small vessel white matter changes are present bilaterally. 4. Extensive branch vessel stenoses and small vessel disease in both the anterior and posterior circulation. 5. Prominent proximal stenoses in M2 branches bilaterally. 6. No significant stenosis of the medium or large intracranial vessels proximal to the MCA bifurcations. These results were called by telephone at the time of interpretation on 10/13/2014 at 840 pm: To Georga Hacking, RN, who verbally acknowledged these results.   Electronically Signed   By: Marin Roberts M.D.   On: 10/13/2014 20:40    Scheduled Meds: . aspirin  325 mg Oral Daily  . atorvastatin  20 mg Oral q1800  . enoxaparin (LOVENOX) injection  40 mg Subcutaneous Q24H  . furosemide  60 mg Oral Daily  . [START ON 10/16/2014] gabapentin  300 mg Oral QHS  . insulin aspart  8 Units Subcutaneous TID WC  . insulin glargine  55 Units Subcutaneous QHS  . levothyroxine  125 mcg Oral QAC breakfast  . LORazepam  1 mg Oral BID  . losartan  100 mg Oral Daily  . meloxicam  7.5 mg Oral Daily  . pantoprazole  40 mg Oral Daily  . PARoxetine  20 mg Oral Daily  . potassium chloride  20 mEq Oral Daily   Continuous Infusions: . sodium  chloride 500 mL (10/15/14 1335)    Principal Problem:   CVA (cerebral infarction) Active Problems:   Fibromyalgia   Diabetes mellitus   Depression with anxiety   Obesity   Hyperlipidemia   CVA (cerebral vascular accident)   Cerebral infarction due to embolism of cerebral artery    Time spent: *25 min    [facility]  [facility] Pager [phone]. If 7PM-7AM, please contact night-coverage at [email or web address], password [facility] 10/15/2014, 2:54 PM  LOS: 2 days

## 2014-10-15 NOTE — Progress Notes (Signed)
STROKE TEAM PROGRESS NOTE   HISTORY Dawn FoilFaye O Ho is an 64 y.o. female who reports that for the past 3 weeks she has had symptoms of left sided numbness and weakness along with slurred speech, facial droop and occipital headache. Although the symptoms have been intermittent she does not recall going more than 2 hours without symptoms during the past 3 weeks. She has been unable to see her PCP due to the fact that she was changing doctors. Although her symptoms have been present for the most part of the past three weeks this morning on awakening she and her family felt that they were worse. Her speech was more slurred and her facial droop was more prominent. The patient has also been unable to stay compliant with many of her medication due to being in between doctors. Her symptoms have resolved somewhat since her arrival.   Her time last known well was unknown. Patient was not administered TPA secondary to delay in arrival. She was admitted for further evaluation and treatment.   SUBJECTIVE (INTERVAL HISTORY) Multiple family members present. The patient has been ambulating with the nurse. Dr. Pearlean BrownieSethi discussed possible inpatient rehabilitation. The patient is for a TEE and loop today.   OBJECTIVE Temp:  [97.7 F (36.5 C)-98.7 F (37.1 C)] 98.1 F (36.7 C) (02/26 0627) Pulse Rate:  [52-62] 52 (02/26 0627) Cardiac Rhythm:  [-] Normal sinus rhythm (02/25 1905) Resp:  [16-18] 16 (02/26 0627) BP: (141-161)/(54-79) 141/61 mmHg (02/26 0627) SpO2:  [93 %-97 %] 97 % (02/26 0627)   Recent Labs Lab 10/14/14 0644 10/14/14 1213 10/14/14 1701 10/14/14 2212 10/15/14 0629  GLUCAP 142* 209* 211* 183* 201*    Recent Labs Lab 10/13/14 1500 10/13/14 1532  NA 142 142  K 3.8 3.7  CL 102 101  CO2 28  --   GLUCOSE 148* 151*  BUN 16 18  CREATININE 0.83 0.90  CALCIUM 10.4  --     Recent Labs Lab 10/13/14 1500  AST 42*  ALT 27  ALKPHOS 89  BILITOT 1.0  PROT 7.3  ALBUMIN 3.9     Recent Labs Lab 10/13/14 1500 10/13/14 1532  WBC 9.0  --   NEUTROABS 5.3  --   HGB 14.2 15.6*  HCT 42.8 46.0  MCV 93.2  --   PLT 197  --    No results for input(s): CKTOTAL, CKMB, CKMBINDEX, TROPONINI in the last 168 hours.  Recent Labs  10/13/14 1500  LABPROT 12.4  INR 0.92    Recent Labs  10/13/14 1500  COLORURINE YELLOW  LABSPEC 1.012  PHURINE 8.0  GLUCOSEU NEGATIVE  HGBUR NEGATIVE  BILIRUBINUR NEGATIVE  KETONESUR NEGATIVE  PROTEINUR 30*  UROBILINOGEN 0.2  NITRITE NEGATIVE  LEUKOCYTESUR NEGATIVE       Component Value Date/Time   CHOL 207* 10/14/2014 0419   TRIG 358* 10/14/2014 0419   HDL 42 10/14/2014 0419   CHOLHDL 4.9 10/14/2014 0419   VLDL 72* 10/14/2014 0419   LDLCALC 93 10/14/2014 0419   Lab Results  Component Value Date   HGBA1C 9.8* 10/14/2014      Component Value Date/Time   LABOPIA NONE DETECTED 10/13/2014 1500   COCAINSCRNUR NONE DETECTED 10/13/2014 1500   LABBENZ NONE DETECTED 10/13/2014 1500   AMPHETMU NONE DETECTED 10/13/2014 1500   THCU NONE DETECTED 10/13/2014 1500   LABBARB NONE DETECTED 10/13/2014 1500     Recent Labs Lab 10/13/14 1943  ETH <5    Ct Ho Wo Contrast 10/13/2014    Progressed  chronic small vessel ischemia since 2012. No acute cortically based infarct or acute intracranial hemorrhage identified.       Mr Dawn Ho Wo Contrast 10/13/2014    1. Multiple punctate acute/subacute infarcts of varying ages compatible with a history of symptoms over the last 3 weeks. The largest cluster of infarcts is in the posterior right frontal lobe, compatible with left-sided numbness and weakness.  2. Other more remote infarcts are present within the left greater than right basal ganglia and thalami as well as within the deep white matter.  3. Extensive small vessel white matter changes are present bilaterally.  4. Extensive branch vessel stenoses and small vessel disease in both the anterior and posterior circulation.  5.  Prominent proximal stenoses in M2 branches bilaterally.  6. No significant stenosis of the medium or large intracranial vessels proximal to the MCA bifurcations.      PHYSICAL EXAM Pleasant middle-aged African-American lady currently not in distress. . Afebrile. Ho is nontraumatic. Neck is supple without bruit.    Cardiac exam no murmur or gallop. Lungs are clear to auscultation. Distal pulses are well felt. Neurological Exam : Awake alert oriented x 3 normal speech and language. Mild left lower face asymmetry. Tongue midline. Mild LUE and LLE drift. Mild diminished fine finger movements on left. Orbits right over left upper extremity. Mild left grip weak.. Normal sensation . Normal coordination.    ASSESSMENT/PLAN [name] is a 64 y.o. 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 She did not receive IV t-PA due to delay in arrival.   Stroke:  bilateral punctate acute/subacute infarcts,  Embolic secondary to unknown source  Resultant  Dysarthria, facial weakness  MRI  Multiple bilateral punctate acute/subacute infarcts, largest cluster in the posterior right frontal lobe. Old left greater than right basal ganglia and thalami and deep white matter infarcts. Extensive bilateral small vessel white matter changes   MRA  Extensive anterior and posterior branch vessel stenoses and small vessel disease. proximal B M2 stenoses   Carotid Doppler -1-39% ICA stenosis. Vertebral artery flow is antegrade.   2D Echo - EF 55-60%. No cardiac source of emboli identified.  TEE - pending  Loop - pending  Lovenox 40 mg sq daily for VTE prophylaxis  Diet NPO time specified thin liquids  aspirin 81 mg orally every day prior to admission, now on aspirin 325 mg orally every day  Patient counseled to be compliant with her antithrombotic  medications  Ongoing aggressive stroke risk factor management  Pt reports a ? Hx of atrial fibrillation, seen by cardiology in 2012 without mention of atrial fibrillation. Recommend cardiology consult to evaluate possible history of atrial fibrillation vs TEE/loop recorder placement this admission. Will discuss with Dr. Sharl Ma   Therapy recommendations:  pending   Disposition:  Pending. Likely rehab  Hypertension, malignant  BP 204/107  -  141/61 this a.m.  Improving   Hyperlipidemia  Home meds:  No statin  LDL 93, goal < 70  Add Lipitor 20 mg QD  Continue statin at discharge  Diabetes  HgbA1c 9.8, goal < 7.0  Other Stroke Risk Factors  Morbid Obesity, Body mass index is 44.74 kg/(m^2).   Hx stroke/TIA   Hospital day # 2  RINEHULS, DAVID [facility] See Amion for Pager information 10/15/2014 9:02 AM    I have personally examined this patient,  reviewed notes, independently viewed imaging studies, participated in medical decision making and plan of care. I have made any additions or clarifications directly to the above note. Agree with note above. Patient has bi-cerebral embolic infarcts of unknown source. She is at significant risk for recurrent strokes and neurological worsening and needs ongoing stroke evaluation and aggressive risk factor modification. Recommend Await   TEE and loop recorder implant. Discussed with patient and family and answered questions  Delia Heady, MD  To contact Stroke Continuity provider, please refer to [email or web address]. After hours, contact General Neurology

## 2014-10-16 LAB — GLUCOSE, CAPILLARY
GLUCOSE-CAPILLARY: 186 mg/dL — AB (ref 70–99)
Glucose-Capillary: 145 mg/dL — ABNORMAL HIGH (ref 70–99)
Glucose-Capillary: 195 mg/dL — ABNORMAL HIGH (ref 70–99)
Glucose-Capillary: 183 mg/dL — ABNORMAL HIGH (ref 70–99)

## 2014-10-16 MED ORDER — METOPROLOL SUCCINATE ER 25 MG PO TB24
50.0000 mg | ORAL_TABLET | Freq: Every day | ORAL | Status: DC
  Administered 2014-10-16: 50 mg via ORAL
  Filled 2014-10-16: qty 2

## 2014-10-16 MED ORDER — METOPROLOL SUCCINATE ER 25 MG PO TB24: 50.0000 mg | ORAL_TABLET | Freq: Two times a day (BID) | ORAL | Status: DC

## 2014-10-16 MED ORDER — ASPIRIN EC 81 MG PO TBEC
81.0000 mg | DELAYED_RELEASE_TABLET | Freq: Every day | ORAL | Status: DC
  Administered 2014-10-17 – 2014-10-18 (×2): 81 mg via ORAL
  Filled 2014-10-16 (×2): qty 1

## 2014-10-16 MED ORDER — CLOPIDOGREL BISULFATE 75 MG PO TABS
75.0000 mg | ORAL_TABLET | Freq: Every day | ORAL | Status: DC
  Administered 2014-10-17 – 2014-10-18 (×2): 75 mg via ORAL
  Filled 2014-10-16 (×2): qty 1

## 2014-10-16 NOTE — Progress Notes (Signed)
STROKE TEAM PROGRESS NOTE   HISTORY Dawn Ho Dawn Ho is an 64 y.Ho. female who reports that for the past 3 weeks she has had symptoms of left sided numbness and weakness along with slurred speech, facial droop and occipital headache. Although the symptoms have been intermittent she does not recall going more than 2 hours without symptoms during the past 3 weeks. She has been unable to see her PCP due to the fact that she was changing doctors. Although her symptoms have been present for the most part of the past three weeks this morning on awakening she and her family felt that they were worse. Her speech was more slurred and her facial droop was more prominent. The patient has also been unable to stay compliant with many of her medication due to being in between doctors. Her symptoms have resolved somewhat since her arrival.   Her time last known well was unknown. Patient was not administered TPA secondary to delay in arrival. She was admitted for further evaluation and treatment.   SUBJECTIVE (INTERVAL HISTORY) Family members present. The patient feels that she is improving.   OBJECTIVE Temp:  [97.7 F (36.5 C)-98.7 F (37.1 C)] 98.1 F (36.7 C) (02/27 0634) Pulse Rate:  [53-84] 84 (02/27 0634) Cardiac Rhythm:  [-]  Resp:  [8-24] 16 (02/27 0634) BP: (115-209)/(49-133) 183/88 mmHg (02/27 0634) SpO2:  [91 %-98 %] 96 % (02/27 0634)   Recent Labs Lab 10/14/14 0644 10/14/14 1213 10/14/14 1701 10/14/14 2212 10/15/14 0629  GLUCAP 142* 209* 211* 183* 201*    Recent Labs Lab 10/13/14 1500 10/13/14 1532  NA 142 142  K 3.8 3.7  CL 102 101  CO2 28  --   GLUCOSE 148* 151*  BUN 16 18  CREATININE 0.83 0.90  CALCIUM 10.4  --     Recent Labs Lab 10/13/14 1500  AST 42*  ALT 27  ALKPHOS 89  BILITOT 1.0  PROT 7.3  ALBUMIN 3.9    Recent Labs Lab 10/13/14 1500 10/13/14 1532  WBC 9.0  --   NEUTROABS 5.3  --   HGB 14.2 15.6*  HCT 42.8 46.0  MCV 93.2  --   PLT 197  --     No results for input(s): CKTOTAL, CKMB, CKMBINDEX, TROPONINI in the last 168 hours.  Recent Labs  10/13/14 1500  LABPROT 12.4  INR 0.92    Recent Labs  10/13/14 1500  COLORURINE YELLOW  LABSPEC 1.012  PHURINE 8.0  GLUCOSEU NEGATIVE  HGBUR NEGATIVE  BILIRUBINUR NEGATIVE  KETONESUR NEGATIVE  PROTEINUR 30*  UROBILINOGEN 0.2  NITRITE NEGATIVE  LEUKOCYTESUR NEGATIVE       Component Value Date/Time   CHOL 207* 10/14/2014 0419   TRIG 358* 10/14/2014 0419   HDL 42 10/14/2014 0419   CHOLHDL 4.9 10/14/2014 0419   VLDL 72* 10/14/2014 0419   LDLCALC 93 10/14/2014 0419   Lab Results  Component Value Date   HGBA1C 9.8* 10/14/2014      Component Value Date/Time   LABOPIA NONE DETECTED 10/13/2014 1500   COCAINSCRNUR NONE DETECTED 10/13/2014 1500   LABBENZ NONE DETECTED 10/13/2014 1500   AMPHETMU NONE DETECTED 10/13/2014 1500   THCU NONE DETECTED 10/13/2014 1500   LABBARB NONE DETECTED 10/13/2014 1500     Recent Labs Lab 10/13/14 1943  ETH <5   TEE 10/15/2014 Left ventricle: LVEF is normal. ------------------------------------------------------------------- Aortic valve: AV ismildly thickend. Trace AI. ------------------------------------------------------------------- Aorta: Mild fixed plaque in the thoracic aorta. ------------------------------------------------------------------- Mitral valve: MV is normal MIld  MR. ------------------------------------------------------------------- Left atrium:  No evidence of thrombus in the atrial cavity or appendage. ------------------------------------------------------------------- Atrial septum: No PFO by color doppler or as tested with injection of agitated saline. ------------------------------------------------------------------- Tricuspid valve: TV is normal Trace TR. ------------------------------------------------------------------- Post procedure conclusions Ascending Aorta: - Mild fixed plaque in the  thoracic aorta.    Ct Head Wo Contrast 10/13/2014    Progressed chronic small vessel ischemia since 2012. No acute cortically based infarct or acute intracranial hemorrhage identified.       Mr [name] Head Wo Contrast 10/13/2014    1. Multiple punctate acute/subacute infarcts of varying ages compatible with a history of symptoms over the last 3 weeks. The largest cluster of infarcts is in the posterior right frontal lobe, compatible with left-sided numbness and weakness.  2. Other more remote infarcts are present within the left greater than right basal ganglia and thalami as well as within the deep white matter.  3. Extensive small vessel white matter changes are present bilaterally.  4. Extensive branch vessel stenoses and small vessel disease in both the anterior and posterior circulation.  5. Prominent proximal stenoses in M2 branches bilaterally.  6. No significant stenosis of the medium or large intracranial vessels proximal to the MCA bifurcations.      PHYSICAL EXAM Pleasant middle-aged African-American lady currently not in distress. . Afebrile. Head is nontraumatic. Neck is supple without bruit.    Cardiac exam no murmur or gallop. Lungs are clear to auscultation. Distal pulses are well felt. Neurological Exam : Awake alert oriented x 3 normal speech and language. Mild left lower face asymmetry. Tongue midline. Mild LUE and LLE drift. Mild diminished fine finger movements on left. Orbits right over left upper extremity. Mild left grip weak.. Normal sensation . Normal coordination.    ASSESSMENT/PLAN Dawn Ho is a 63 y.Ho. 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 She did not receive IV t-PA due to delay in arrival.   Stroke:  bilateral punctate acute/subacute infarcts,  Embolic secondary to unknown source  Resultant   Dysarthria, facial weakness  MRI  Multiple bilateral punctate acute/subacute infarcts, largest cluster in the posterior right frontal lobe. Old left greater than right basal ganglia and thalami and deep white matter infarcts. Extensive bilateral small vessel white matter changes   MRA  Extensive anterior and posterior branch vessel stenoses and small vessel disease. proximal B M2 stenoses   Carotid Doppler -1-39% ICA stenosis. Vertebral artery flow is antegrade.   2D Echo - EF 55-60%. No cardiac source of emboli identified.  TEE - no PFO or evidence of thrombus  Loop - implanted yesterday  Lovenox 40 mg sq daily for VTE prophylaxis  Diet Carb Modified thin liquids  aspirin 81 mg orally every day prior to admission, now on aspirin 325 mg orally every day  Patient counseled to be compliant with her antithrombotic medications  Ongoing aggressive stroke risk factor management  Pt reports a ? Hx of atrial fibrillation however no documentation has been found.  Disposition:  Pending.   Hypertension, malignant  BP - still running high  Improving   Hyperlipidemia  Home meds:  No statin  LDL 93, goal < 70  Add Lipitor 20 mg QD  Continue statin at discharge  Diabetes  HgbA1c 9.8, goal < 7.0  Other Stroke Risk Factors  Morbid Obesity, Body mass index is 44.74 kg/(m^2).  Hx stroke/TIA    PLAN   Continue to treat hypertension.  Needs better glucose control.  Stroke team will sign off. Please call if we can be of further service.  Follow-up Dr. Pearlean Brownie in 2 months.  Cardiology recommended aspirin and Plavix therapy for now. Was on aspirin 81 mg daily at home. Changed to aspirin 325 mg daily this admission. Will now change to aspirin 81 mg and Plavix 75 mg.  Therapist's recommend SNF placement.  Hospital day # 3  RINEHULS, DAVID [facility] See Amion for Pager information 10/16/2014 8:58 AM   Pt has been seen and examined along with the  practitioner. Note reviewed. Imaging and laboratory work reviewed. Will  Sign off call with questions.    Pauletta Browns     To contact Stroke Continuity provider, please refer to [email or web address]. After hours, contact General Neurology

## 2014-10-16 NOTE — Progress Notes (Signed)
[facility] PROGRESS NOTE  SYEDA HADY QIH:[id number] DOB: 27-Jun-1951 DOA: 10/13/2014 PCP: No PCP Per Patient  Assessment/Plan: 1. Bilateral punctate acute/subacute infarcts- patient has remote history of A. fib. She has been started on aspirin 325 mg by mouth daily. Discussed with neurology, Dr. Pearlean Brownie recommended patient be seen by cardiology for further evaluation regarding TEE versus starting anticoagulation. Cardiology saw the patient and plan is for TEE today. 2. Somnolence-improved after making gabapentin 300 mg by mouth at bedtime only. 3. Hypertension- blood pressure medications were on hold for permissive hypertension, will restart low-dose Toprol-XL 50 mg at bedtime daily 4. Diabetes mellitus- continue Lantus and sliding scale insulin. Blood glucose is well controlled. 5. Depression with anxiety- continue Ativan and Paxil 6. Hypothyroidism-continue Synthroid 7. DVT prophylaxis- Lovenox  Code Status: Full code Family Communication: *No family at bedside Disposition Plan: *Home when stable   Consultants:  *Neurology  Procedures:  None  Antibiotics:  None  HPI/Subjective: 64 y.o. 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 She woke up this morning with a posterior headache. Her husband noted that she was slurring her words and she had a left-sided facial droop. He does state that this has been on and off for about 3 weeks now but this morning it just didn't resolve and therefore he brought her into the ER. In the ER the symptoms persisted and I was called to do her admission for CVA. By the time I walked into the room the family states her symptoms had resolved. She has been taking a baby aspirin every day that has not had her Lipitor. She and admits to the fact that she has run out of some of her medications including  Toprol Prilosec and Lipitor. She is in between doctors and because it took so long to transfer records from one doctor to another doctor and the fact that she has not been able to see the new doctor yet, neither of them aren't willing to prescribe her medicines for her.  MRI showed multiple punctate acute/subacute infarcts of varying ages him better with a history of symptoms over the past 3 weeks. Largest cluster of infarcts in the posterior right frontal lobe compatible with left-sided numbness and weakness.  Patient seen and examined, says that somnolence has reduced after changing the gabapentin. Family at bedside   Objective: Filed Vitals:   10/16/14 0929  BP: 182/90  Pulse: 80  Temp: 98.5 F (36.9 C)  Resp: 20   No intake or output data in the 24 hours ending 10/16/14 1234 Filed Weights   10/13/14 1930  Weight: 103.919 kg (229 lb 1.6 oz)    Exam:   General:  Appearing no acute distress  Cardiovascular: S1-S2 regular  Respiratory: Clear to auscultation bilaterally  Abdomen: Soft nontender no organomegaly  Musculoskeletal: No edema  Neuro- alert and oriented 3, mild weakness of left upper and lower extremity  Data Reviewed: Basic Metabolic Panel:  Recent Labs Lab 10/13/14 1500 10/13/14 1532  NA 142 142  K 3.8 3.7  CL 102 101  CO2 28  --   GLUCOSE 148* 151*  BUN 16 18  CREATININE 0.83 0.90  CALCIUM 10.4  --    Liver Function Tests:  Recent Labs Lab 10/13/14 1500  AST 42*  ALT 27  ALKPHOS 89  BILITOT 1.0  PROT 7.3  ALBUMIN 3.9   No results for  input(s): LIPASE, AMYLASE in the last 168 hours. No results for input(s): AMMONIA in the last 168 hours. CBC:  Recent Labs Lab 10/13/14 1500 10/13/14 1532  WBC 9.0  --   NEUTROABS 5.3  --   HGB 14.2 15.6*  HCT 42.8 46.0  MCV 93.2  --   PLT 197  --    Cardiac Enzymes: No results for input(s): CKTOTAL, CKMB, CKMBINDEX, TROPONINI in the last 168 hours. BNP (last 3 results) No results for  input(s): BNP in the last 8760 hours.  ProBNP (last 3 results) No results for input(s): PROBNP in the last 8760 hours.  CBG:  Recent Labs Lab 10/15/14 1117 10/15/14 1618 10/15/14 2138 10/16/14 0739 10/16/14 1148  GLUCAP 209* 166* 241* 145* 195*    No results found for this or any previous visit (from the past 240 hour(s)).   Studies: No results found.  Scheduled Meds: . aspirin  325 mg Oral Daily  . atorvastatin  20 mg Oral q1800  . enoxaparin (LOVENOX) injection  40 mg Subcutaneous Q24H  . furosemide  60 mg Oral Daily  . gabapentin  300 mg Oral QHS  . insulin aspart  8 Units Subcutaneous TID WC  . insulin glargine  55 Units Subcutaneous QHS  . levothyroxine  125 mcg Oral QAC breakfast  . LORazepam  1 mg Oral BID  . losartan  100 mg Oral Daily  . meloxicam  7.5 mg Oral Daily  . metoprolol succinate  50 mg Oral Daily  . pantoprazole  40 mg Oral Daily  . PARoxetine  20 mg Oral Daily  . potassium chloride  20 mEq Oral Daily   Continuous Infusions: . sodium chloride 500 mL (10/15/14 1335)    Principal Problem:   CVA (cerebral infarction) Active Problems:   Fibromyalgia   Diabetes mellitus   Depression with anxiety   Obesity   Hyperlipidemia   CVA (cerebral vascular accident)   Cerebral infarction due to embolism of cerebral artery    Time spent: *25 min    [facility]  [facility] Pager [phone]. If 7PM-7AM, please contact night-coverage at [email or web address], password [facility] 10/16/2014, 12:34 PM  LOS: 3 days

## 2014-10-17 DIAGNOSIS — M797 Fibromyalgia: Secondary | ICD-10-CM

## 2014-10-17 LAB — GLUCOSE, CAPILLARY
GLUCOSE-CAPILLARY: 186 mg/dL — AB (ref 70–99)
GLUCOSE-CAPILLARY: 222 mg/dL — AB (ref 70–99)
GLUCOSE-CAPILLARY: 228 mg/dL — AB (ref 70–99)
GLUCOSE-CAPILLARY: 198 mg/dL — AB (ref 70–99)

## 2014-10-17 NOTE — Social Work (Addendum)
Clinical Social Work Department CLINICAL SOCIAL WORK PLACEMENT NOTE 10/17/2014  Patient:  Royetta CarGREEN,EVELYN M  Account Number:  [id number] Admit date:  10/16/2014  Clinical Social Worker:  Nathen MayYWAN LINDSEY, LCSW  Date/time:  10/17/2014 05:49 PM  Clinical Social Work is seeking post-discharge placement for this patient at the following level of care:   SKILLED NURSING   (*CSW will update this form in Epic as items are completed)   10/17/2014  [facility]family provided with [facility] of Clinical Social Work's list of facilities offering this level of care within the geographic area requested by the patient (or if unable, by the patient's family).  10/17/2014  Patient/family informed of their freedom to choose among providers that offer the needed level of care, that participate in Medicare, Medicaid or managed care program needed by the patient, have an available bed and are willing to accept the patient.  10/17/2014  Patient/family informed of [facility]' ownership interest in [facility], as well as of the fact that they are under no obligation to receive care at this facility.  PASARR submitted to EDS on 10/16/2014 PASARR number received on 10/16/2014  FL2 transmitted to all facilities in geographic area requested by pt/family on  10/17/2014 FL2 transmitted to all facilities within larger geographic area on  10/18/2014  Patient informed that his/her managed care company has contracts with or will negotiate with  certain facilities, including the following:  YES   Patient/family informed of bed offers received:  10/18/2014 [facility]  Physician recommends and patient chooses bed at    [facility]   on  10/18/2014 Patient to be transferred to facility by [facility] Patient and family notified of transfer on  10/18/2014 Name of family member notified:  Pt's dtr and son present at  bedside.  The following physician request were entered in Epic:   Additional Comments:

## 2014-10-17 NOTE — Social Work (Signed)
Clinical Social Work Department BRIEF PSYCHOSOCIAL ASSESSMENT 10/17/2014  Patient:  Dawn Ho, Dawn Ho     Account Number:  [id number]     [place] date:  10/13/2014  Clinical Social Worker:  Lenord Fellers  Date/Time:  10/17/2014 05:40 PM  Referred by:  Physician  Date Referred:  10/15/2014 Referred for  SNF Placement   Other Referral:   Interview type:  Family Other interview type:    PSYCHOSOCIAL DATA Living Status:  FAMILY Admitted from facility:   Level of care:   Primary support name:  Mariann Laster Primary support relationship to patient:  CHILD, ADULT Degree of support available:   Good. Husband at home but patient's daughter was present and very supportive. CSW met with patient and daughter. Daughter Mariann Laster [phone].    CURRENT CONCERNS Current Concerns  Post-Acute Placement  Post-Acute Placement   Other Concerns:    SOCIAL WORK ASSESSMENT / PLAN Patient and daughter both agreeable to placment. Patient's daughter states that they understand that patient will need long term care but desire that care is provided in [facility]. Patient lives at home with husband but has family in nearby community for additoinal support.   Assessment/plan status:  Psychosocial Support/Ongoing Assessment of Needs Other assessment/ plan:   Information/referral to community resources:    PATIENT'S/FAMILY'S RESPONSE TO PLAN OF CARE: Patient and daughter agree to being faxed out to facilities in [facility] Patient and daughter desire that [facility] is their number 1 choice.

## 2014-10-17 NOTE — Progress Notes (Signed)
STROKE TEAM PROGRESS NOTE   HISTORY Dawn Ho is an 64 y.o. female who reports that for the past 3 weeks she has had symptoms of left sided numbness and weakness along with slurred speech, facial droop and occipital headache. Although the symptoms have been intermittent she does not recall going more than 2 hours without symptoms during the past 3 weeks. She has been unable to see her PCP due to the fact that she was changing doctors. Although her symptoms have been present for the most part of the past three weeks this morning on awakening she and her family felt that they were worse. Her speech was more slurred and her facial droop was more prominent. The patient has also been unable to stay compliant with many of her medication due to being in between doctors. Her symptoms have resolved somewhat since her arrival.   Her time last known well was unknown. Patient was not administered TPA secondary to delay in arrival. She was admitted for further evaluation and treatment.   SUBJECTIVE (INTERVAL HISTORY) Several family members present. The patient states that when she awoke this morning she had increased weakness on her left side. It has persisted.   OBJECTIVE Temp:  [97.7 F (36.5 C)-98.8 F (37.1 C)] 98.2 F (36.8 C) (02/28 0936) Pulse Rate:  [59-106] 70 (02/28 0936) Cardiac Rhythm:  [-] Sinus tachycardia (02/27 2003) Resp:  [16-20] 20 (02/28 0936) BP: (126-179)/(64-93) 132/76 mmHg (02/28 0936) SpO2:  [92 %-97 %] 92 % (02/28 0936)   Recent Labs Lab 10/14/14 0644 10/14/14 1213 10/14/14 1701 10/14/14 2212 10/15/14 0629  GLUCAP 142* 209* 211* 183* 201*    Recent Labs Lab 10/13/14 1500 10/13/14 1532  NA 142 142  K 3.8 3.7  CL 102 101  CO2 28  --   GLUCOSE 148* 151*  BUN 16 18  CREATININE 0.83 0.90  CALCIUM 10.4  --     Recent Labs Lab 10/13/14 1500  AST 42*  ALT 27  ALKPHOS 89  BILITOT 1.0  PROT 7.3  ALBUMIN 3.9    Recent Labs Lab 10/13/14 1500  10/13/14 1532  WBC 9.0  --   NEUTROABS 5.3  --   HGB 14.2 15.6*  HCT 42.8 46.0  MCV 93.2  --   PLT 197  --    No results for input(s): CKTOTAL, CKMB, CKMBINDEX, TROPONINI in the last 168 hours.  Recent Labs  10/13/14 1500  LABPROT 12.4  INR 0.92    Recent Labs  10/13/14 1500  COLORURINE YELLOW  LABSPEC 1.012  PHURINE 8.0  GLUCOSEU NEGATIVE  HGBUR NEGATIVE  BILIRUBINUR NEGATIVE  KETONESUR NEGATIVE  PROTEINUR 30*  UROBILINOGEN 0.2  NITRITE NEGATIVE  LEUKOCYTESUR NEGATIVE       Component Value Date/Time   CHOL 207* 10/14/2014 0419   TRIG 358* 10/14/2014 0419   HDL 42 10/14/2014 0419   CHOLHDL 4.9 10/14/2014 0419   VLDL 72* 10/14/2014 0419   LDLCALC 93 10/14/2014 0419   Lab Results  Component Value Date   HGBA1C 9.8* 10/14/2014      Component Value Date/Time   LABOPIA NONE DETECTED 10/13/2014 1500   COCAINSCRNUR NONE DETECTED 10/13/2014 1500   LABBENZ NONE DETECTED 10/13/2014 1500   AMPHETMU NONE DETECTED 10/13/2014 1500   THCU NONE DETECTED 10/13/2014 1500   LABBARB NONE DETECTED 10/13/2014 1500     Recent Labs Lab 10/13/14 1943  ETH <5   TEE 10/15/2014 Left ventricle: LVEF is normal. ------------------------------------------------------------------- Aortic valve: AV ismildly thickend. Trace  AI. ------------------------------------------------------------------- Aorta: Mild fixed plaque in the thoracic aorta. ------------------------------------------------------------------- Mitral valve: MV is normal MIld MR. ------------------------------------------------------------------- Left atrium:  No evidence of thrombus in the atrial cavity or appendage. ------------------------------------------------------------------- Atrial septum: No PFO by color doppler or as tested with injection of agitated saline. ------------------------------------------------------------------- Tricuspid valve: TV is normal Trace  TR. ------------------------------------------------------------------- Post procedure conclusions Ascending Aorta: - Mild fixed plaque in the thoracic aorta.    Ct Head Wo Contrast 10/13/2014    Progressed chronic small vessel ischemia since 2012. No acute cortically based infarct or acute intracranial hemorrhage identified.       Mr Maxine GlennMra Head Wo Contrast 10/13/2014    1. Multiple punctate acute/subacute infarcts of varying ages compatible with a history of symptoms over the last 3 weeks. The largest cluster of infarcts is in the posterior right frontal lobe, compatible with left-sided numbness and weakness.  2. Other more remote infarcts are present within the left greater than right basal ganglia and thalami as well as within the deep white matter.  3. Extensive small vessel white matter changes are present bilaterally.  4. Extensive branch vessel stenoses and small vessel disease in both the anterior and posterior circulation.  5. Prominent proximal stenoses in M2 branches bilaterally.  6. No significant stenosis of the medium or large intracranial vessels proximal to the MCA bifurcations.      PHYSICAL EXAM Neurologic Examination:  Mental Status:  Alert, oriented, thought content appropriate. Speech without evidence of dysarthria or aphasia. Able to follow 3 step commands without difficulty.  Cranial Nerves:  II-bilateral visual fields intact III/IV/VI-Pupils were equal and reacted. Extraocular movements were full.  V/VII-no facial numbness and no facial weakness.  VIII-normal.  X-normal speech and symmetrical palatal movement.  XII-midline tongue extension  Motor: 5/5 right upper and lower extremities except grip strength 4/5 on the right and 3/5 on the left. Slight drift of the left upper extremity. Lower extremity strength appears to be intact 5/5 bilaterally. Sensory: Intact to light touch in all extremities. Cerebellar: Normal finger to nose       ASSESSMENT/PLAN [name] is a 64 y.o. 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 She did not receive IV t-PA due to delay in arrival.   Stroke:  bilateral punctate acute/subacute infarcts,  Embolic secondary to unknown source  Resultant  Dysarthria, facial weakness  MRI  Multiple bilateral punctate acute/subacute infarcts, largest cluster in the posterior right frontal lobe. Old left greater than right basal ganglia and thalami and deep white matter infarcts. Extensive bilateral small vessel white matter changes   MRA  Extensive anterior and posterior branch vessel stenoses and small vessel disease. proximal B M2 stenoses   Carotid Doppler -1-39% ICA stenosis. Vertebral artery flow is antegrade.   2D Echo - EF 55-60%. No cardiac source of emboli identified.  TEE - no PFO or evidence of thrombus  Loop - implanted yesterday  Lovenox 40 mg sq daily for VTE prophylaxis  Diet Carb Modified thin liquids  aspirin 81 mg orally every day prior to admission, now on aspirin 325 mg orally every day  Patient counseled to be compliant with her antithrombotic medications  Ongoing aggressive stroke risk factor management  Pt reports a ? Hx of atrial fibrillation however no documentation has been found.  Disposition:  Pending.   Hypertension, malignant  BP - still running high  Improving  Hyperlipidemia  Home meds:  No statin  LDL 93, goal < 70  Lipitor 20 mg QD initiated  Continue statin at discharge  Diabetes  HgbA1c 9.8, goal < 7.0  Other Stroke Risk Factors  Morbid Obesity, Body mass index is 44.74 kg/(m^2).   Hx stroke/TIA    PLAN   Possible slight increase in left upper extremity weakness although mild. Discussed with Dr. Nona Dell.   Will not repeat imaging at this time. The patient was  instructed to inform the nurse immediately of any further changes.  Continue to treat hypertension.  Needs better glucose control.  Stroke team will sign off. Please call if we can be of further service.  Follow-up Dr. Pearlean Brownie in 2 months.  Cardiology recommended aspirin and Plavix therapy for now. Was on aspirin 81 mg daily at home. Changed to aspirin 325 mg daily this admission. Will now change to aspirin 81 mg and Plavix 75 mg.  Therapist's recommend SNF placement.  Hospital day # 4  RINEHULS, DAVID Wallace [facility] See Amion for Pager information 10/17/2014 1:30 PM   I examined the patient along with reviewing images, laboratory work up and plan development. Pauletta Browns      To contact Stroke Continuity provider, please refer to [email or web address]. After hours, contact General Neurology

## 2014-10-17 NOTE — Progress Notes (Signed)
[facility] PROGRESS NOTE  Dawn Ho RUE:[id number] DOB: [birth year] DOA: 10/13/2014 PCP: No PCP Per Patient  Assessment/Plan: 1. Bilateral punctate acute/subacute infarcts- patient has remote history of A. fib. She has been started on aspirin 81 mg daily along with Plavix 75 mg by mouth daily . Discussed with neurology, Dr. Pearlean Brownie recommended patient be seen by cardiology for further evaluation regarding TEE versus starting anticoagulation. Cardiology saw the patient and plan is for TEE today. Patient has worsening of the weakness of left hand, her blood pressure today was 132/76, she was started on Toprol-XL 50 mg at bedtime yesterday after discussion with neuro team. Will hold antihypertensive medications at this time. And continue above regimen 2. Somnolence-improved after making gabapentin 300 mg by mouth at bedtime only. 3. Hypertension- will hold the Toprol-XL at this time due to the worsening weakness of left hand 4. Diabetes mellitus- continue Lantus and sliding scale insulin. Blood glucose is well controlled. 5. Depression with anxiety- continue Ativan and Paxil 6. Hypothyroidism-continue Synthroid 7. DVT prophylaxis- Lovenox  Code Status: Full code Family Communication: *No family at bedside Disposition Plan: *Home when stable   Consultants:  *Neurology  Procedures:  None  Antibiotics:  None  HPI/Subjective: 64 y.o. 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 She woke up this morning with a posterior headache. Her husband noted that she was slurring her words and she had a left-sided facial droop. He does state that this has been on and off for about 3 weeks now but this morning it just didn't resolve and therefore he brought her into the ER. In the ER the symptoms persisted and I was called to do her admission for  CVA. By the time I walked into the room the family states her symptoms had resolved. She has been taking a baby aspirin every day that has not had her Lipitor. She and admits to the fact that she has run out of some of her medications including Toprol Prilosec and Lipitor. She is in between doctors and because it took so long to transfer records from one doctor to another doctor and the fact that she has not been able to see the new doctor yet, neither of them aren't willing to prescribe her medicines for her.  MRI showed multiple punctate acute/subacute infarcts of varying ages him better with a history of symptoms over the past 3 weeks. Largest cluster of infarcts in the posterior right frontal lobe compatible with left-sided numbness and weakness.  Patient seen and examined, today complains of worsening of weakness of the left hand. She was started on Toprol-XL 50 g at bedtime yesterday   Objective: Filed Vitals:   10/17/14 0936  BP: 132/76  Pulse: 70  Temp: 98.2 F (36.8 C)  Resp: 20    Intake/Output Summary (Last 24 hours) at 10/17/14 1048 Last data filed at 10/16/14 1737  Gross per 24 hour  Intake    120 ml  Output      0 ml  Net    120 ml   Filed Weights   10/13/14 1930  Weight: 103.919 kg (229 lb 1.6 oz)    Exam:   General:  Appearing no acute distress  Cardiovascular: S1-S2 regular  Respiratory: Clear to auscultation bilaterally  Abdomen: Soft nontender no organomegaly  Musculoskeletal: No edema  Neuro- alert and oriented 3, mild weakness of left upper extremity  Data Reviewed: Basic Metabolic Panel:  Recent Labs Lab 10/13/14 1500 10/13/14 1532  NA 142 142  K 3.8 3.7  CL 102 101  CO2 28  --   GLUCOSE 148* 151*  BUN 16 18  CREATININE 0.83 0.90  CALCIUM 10.4  --    Liver Function Tests:  Recent Labs Lab 10/13/14 1500  AST 42*  ALT 27  ALKPHOS 89  BILITOT 1.0  PROT 7.3  ALBUMIN 3.9   No results for input(s): LIPASE, AMYLASE in the last  168 hours. No results for input(s): AMMONIA in the last 168 hours. CBC:  Recent Labs Lab 10/13/14 1500 10/13/14 1532  WBC 9.0  --   NEUTROABS 5.3  --   HGB 14.2 15.6*  HCT 42.8 46.0  MCV 93.2  --   PLT 197  --    Cardiac Enzymes: No results for input(s): CKTOTAL, CKMB, CKMBINDEX, TROPONINI in the last 168 hours. BNP (last 3 results) No results for input(s): BNP in the last 8760 hours.  ProBNP (last 3 results) No results for input(s): PROBNP in the last 8760 hours.  CBG:  Recent Labs Lab 10/16/14 0739 10/16/14 1148 10/16/14 1626 10/16/14 2109 10/17/14 0627  GLUCAP 145* 195* 183* 186* 186*    No results found for this or any previous visit (from the past 240 hour(s)).   Studies: No results found.  Scheduled Meds: . aspirin EC  81 mg Oral Daily  . atorvastatin  20 mg Oral q1800  . clopidogrel  75 mg Oral Daily  . enoxaparin (LOVENOX) injection  40 mg Subcutaneous Q24H  . furosemide  60 mg Oral Daily  . gabapentin  300 mg Oral QHS  . insulin aspart  8 Units Subcutaneous TID WC  . insulin glargine  55 Units Subcutaneous QHS  . levothyroxine  125 mcg Oral QAC breakfast  . LORazepam  1 mg Oral BID  . losartan  100 mg Oral Daily  . meloxicam  7.5 mg Oral Daily  . pantoprazole  40 mg Oral Daily  . PARoxetine  20 mg Oral Daily  . potassium chloride  20 mEq Oral Daily   Continuous Infusions: . sodium chloride 20 mL/hr at 10/16/14 1745    Principal Problem:   CVA (cerebral infarction) Active Problems:   Fibromyalgia   Diabetes mellitus   Depression with anxiety   Obesity   Hyperlipidemia   CVA (cerebral vascular accident)   Cerebral infarction due to embolism of cerebral artery    Time spent: *25 min    [facility]  [facility] Pager [phone]. If 7PM-7AM, please contact night-coverage at [email or web address], password [facility] 10/17/2014, 10:48 AM  LOS: 4 days

## 2014-10-18 ENCOUNTER — Encounter (HOSPITAL_COMMUNITY): Payer: Self-pay | Admitting: Internal Medicine

## 2014-10-18 DIAGNOSIS — E08329 Diabetes mellitus due to underlying condition with mild nonproliferative diabetic retinopathy without macular edema: Secondary | ICD-10-CM | POA: Diagnosis not present

## 2014-10-18 DIAGNOSIS — R531 Weakness: Secondary | ICD-10-CM | POA: Diagnosis not present

## 2014-10-18 DIAGNOSIS — N390 Urinary tract infection, site not specified: Secondary | ICD-10-CM | POA: Diagnosis not present

## 2014-10-18 DIAGNOSIS — I69828 Other speech and language deficits following other cerebrovascular disease: Secondary | ICD-10-CM | POA: Diagnosis not present

## 2014-10-18 DIAGNOSIS — E785 Hyperlipidemia, unspecified: Secondary | ICD-10-CM | POA: Diagnosis not present

## 2014-10-18 DIAGNOSIS — E119 Type 2 diabetes mellitus without complications: Secondary | ICD-10-CM | POA: Diagnosis not present

## 2014-10-18 DIAGNOSIS — F419 Anxiety disorder, unspecified: Secondary | ICD-10-CM | POA: Diagnosis not present

## 2014-10-18 DIAGNOSIS — E860 Dehydration: Secondary | ICD-10-CM | POA: Diagnosis not present

## 2014-10-18 DIAGNOSIS — E669 Obesity, unspecified: Secondary | ICD-10-CM | POA: Diagnosis not present

## 2014-10-18 DIAGNOSIS — Z6841 Body Mass Index (BMI) 40.0 and over, adult: Secondary | ICD-10-CM | POA: Diagnosis not present

## 2014-10-18 DIAGNOSIS — I10 Essential (primary) hypertension: Secondary | ICD-10-CM | POA: Diagnosis not present

## 2014-10-18 DIAGNOSIS — E1165 Type 2 diabetes mellitus with hyperglycemia: Secondary | ICD-10-CM | POA: Diagnosis not present

## 2014-10-18 DIAGNOSIS — F329 Major depressive disorder, single episode, unspecified: Secondary | ICD-10-CM | POA: Diagnosis not present

## 2014-10-18 DIAGNOSIS — E039 Hypothyroidism, unspecified: Secondary | ICD-10-CM | POA: Diagnosis not present

## 2014-10-18 DIAGNOSIS — R278 Other lack of coordination: Secondary | ICD-10-CM | POA: Diagnosis not present

## 2014-10-18 DIAGNOSIS — Z7902 Long term (current) use of antithrombotics/antiplatelets: Secondary | ICD-10-CM | POA: Diagnosis not present

## 2014-10-18 DIAGNOSIS — Z794 Long term (current) use of insulin: Secondary | ICD-10-CM | POA: Diagnosis not present

## 2014-10-18 DIAGNOSIS — Z7982 Long term (current) use of aspirin: Secondary | ICD-10-CM | POA: Diagnosis not present

## 2014-10-18 DIAGNOSIS — Z91041 Radiographic dye allergy status: Secondary | ICD-10-CM | POA: Diagnosis not present

## 2014-10-18 DIAGNOSIS — E1342 Other specified diabetes mellitus with diabetic polyneuropathy: Secondary | ICD-10-CM | POA: Diagnosis not present

## 2014-10-18 DIAGNOSIS — R262 Difficulty in walking, not elsewhere classified: Secondary | ICD-10-CM | POA: Diagnosis not present

## 2014-10-18 DIAGNOSIS — Z79899 Other long term (current) drug therapy: Secondary | ICD-10-CM | POA: Diagnosis not present

## 2014-10-18 DIAGNOSIS — I639 Cerebral infarction, unspecified: Secondary | ICD-10-CM | POA: Diagnosis not present

## 2014-10-18 DIAGNOSIS — R404 Transient alteration of awareness: Secondary | ICD-10-CM | POA: Diagnosis not present

## 2014-10-18 DIAGNOSIS — R41841 Cognitive communication deficit: Secondary | ICD-10-CM | POA: Diagnosis not present

## 2014-10-18 DIAGNOSIS — M6289 Other specified disorders of muscle: Secondary | ICD-10-CM | POA: Diagnosis not present

## 2014-10-18 DIAGNOSIS — E780 Pure hypercholesterolemia: Secondary | ICD-10-CM | POA: Diagnosis not present

## 2014-10-18 DIAGNOSIS — Z8673 Personal history of transient ischemic attack (TIA), and cerebral infarction without residual deficits: Secondary | ICD-10-CM | POA: Diagnosis not present

## 2014-10-18 DIAGNOSIS — I6340 Cerebral infarction due to embolism of unspecified cerebral artery: Secondary | ICD-10-CM | POA: Diagnosis not present

## 2014-10-18 DIAGNOSIS — I63031 Cerebral infarction due to thrombosis of right carotid artery: Secondary | ICD-10-CM | POA: Diagnosis not present

## 2014-10-18 DIAGNOSIS — Z8249 Family history of ischemic heart disease and other diseases of the circulatory system: Secondary | ICD-10-CM | POA: Diagnosis not present

## 2014-10-18 DIAGNOSIS — F418 Other specified anxiety disorders: Secondary | ICD-10-CM | POA: Diagnosis not present

## 2014-10-18 DIAGNOSIS — I6990 Unspecified sequelae of unspecified cerebrovascular disease: Secondary | ICD-10-CM | POA: Diagnosis not present

## 2014-10-18 DIAGNOSIS — E1159 Type 2 diabetes mellitus with other circulatory complications: Secondary | ICD-10-CM | POA: Diagnosis not present

## 2014-10-18 DIAGNOSIS — M797 Fibromyalgia: Secondary | ICD-10-CM | POA: Diagnosis not present

## 2014-10-18 DIAGNOSIS — E782 Mixed hyperlipidemia: Secondary | ICD-10-CM | POA: Diagnosis not present

## 2014-10-18 DIAGNOSIS — R29810 Facial weakness: Secondary | ICD-10-CM | POA: Diagnosis not present

## 2014-10-18 DIAGNOSIS — I6931 Cognitive deficits following cerebral infarction: Secondary | ICD-10-CM | POA: Diagnosis not present

## 2014-10-18 DIAGNOSIS — N3000 Acute cystitis without hematuria: Secondary | ICD-10-CM | POA: Diagnosis not present

## 2014-10-18 DIAGNOSIS — G8194 Hemiplegia, unspecified affecting left nondominant side: Secondary | ICD-10-CM | POA: Diagnosis not present

## 2014-10-18 DIAGNOSIS — D649 Anemia, unspecified: Secondary | ICD-10-CM | POA: Diagnosis not present

## 2014-10-18 DIAGNOSIS — M6281 Muscle weakness (generalized): Secondary | ICD-10-CM | POA: Diagnosis not present

## 2014-10-18 LAB — GLUCOSE, CAPILLARY
Glucose-Capillary: 175 mg/dL — ABNORMAL HIGH (ref 70–99)
Glucose-Capillary: 181 mg/dL — ABNORMAL HIGH (ref 70–99)

## 2014-10-18 MED ORDER — CLOPIDOGREL BISULFATE 75 MG PO TABS: 75.0000 mg | ORAL_TABLET | Freq: Every day | ORAL | Status: DC

## 2014-10-18 MED ORDER — LORAZEPAM 1 MG PO TABS: 1.0000 mg | ORAL_TABLET | Freq: Two times a day (BID) | ORAL | Status: DC

## 2014-10-18 MED ORDER — ATORVASTATIN CALCIUM 20 MG PO TABS: 20.0000 mg | ORAL_TABLET | Freq: Every day | ORAL | Status: DC

## 2014-10-18 MED ORDER — GABAPENTIN 300 MG PO CAPS: 300.0000 mg | ORAL_CAPSULE | Freq: Every day | ORAL | Status: AC

## 2014-10-18 MED ORDER — METOPROLOL SUCCINATE ER 100 MG PO TB24: 100.0000 mg | ORAL_TABLET | Freq: Every day | ORAL | Status: DC

## 2014-10-18 NOTE — Clinical Social Work Note (Signed)
Clinical Social Worker facilitated patient discharge including contacting patient family and facility to confirm patient discharge plans.  Clinical information faxed to facility and family agreeable with plan.  [facility]  RN to call report prior to discharge.  DC packet on chart for transport.   Clinical Social Worker will sign off for now as social work intervention is no longer needed. Please consult us again if new need arises.  Dawn Ho, MSW, LCSWA [phone] 10/18/2014 1:33 PM

## 2014-10-18 NOTE — Care Management Note (Signed)
    Page 1 of 1   10/18/2014     3:47:18 PM CARE MANAGEMENT NOTE 10/18/2014  Patient:  Dawn Ho,Dawn Ho   Account Number:  [id number]  Date Initiated:  10/18/2014  Documentation initiated by:  Elmer BalesOBARGE,Matsuko Kretz  Subjective/Objective Assessment:   Patient was admitted with CVA. Lives at home with spouse.     Action/Plan:   Will follow for discharge needs.   Anticipated DC Date:  10/18/2014   Anticipated DC Plan:  SKILLED NURSING FACILITY  In-house referral  Clinical Social Worker      DC Planning Services  CM consult      Choice offered to / List presented to:             Status of service:  Completed, signed off Medicare Important Message given?  YES (If response is "NO", the following Medicare IM given date fields will be blank) Date Medicare IM given:  10/18/2014 Medicare IM given by:  Elmer BalesOBARGE,Dove G[place] Date Additional Medicare IM given:   Additional Medicare IM given by:    Discharge Disposition:  SKILLED NURSING FACILITY  Per UR Regulation:  Reviewed for med. necessity/level of care/duration of stay  If discussed at Long Length of Stay Meetings, dates discussed:    Comments:  10/18/14 1130 Elmer Balesourtney Kalifa Cadden RN, MSN, CM- Medicare IM letter provided.

## 2014-10-18 NOTE — Discharge Summary (Signed)
Physician Discharge Summary  DEJON KASSEBAUM WJX:[id number] DOB: 02/01/1951 DOA: 10/13/2014  PCP: No PCP Per Patient  Admit date: 10/13/2014 Discharge date: 10/18/2014  Time spent: *50 minutes  Recommendations for Outpatient Follow-up:  1. *Follow up Dr Pearlean Brownie in 2 months 2. Follow-up cardiology in 2 weeks  Discharge Diagnoses:  Principal Problem:   CVA (cerebral infarction) Active Problems:   Fibromyalgia   Diabetes mellitus   Depression with anxiety   Obesity   Hyperlipidemia   CVA (cerebral vascular accident)   Cerebral infarction due to embolism of cerebral artery   Discharge Condition: Stable  Diet recommendation: Carb modified diet  Filed Weights   10/13/14 1930  Weight: 103.919 kg (229 lb 1.6 oz)    History of present illness:  64 y.o. 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 She woke up this morning with a posterior headache. Her husband noted that she was slurring her words and she had a left-sided facial droop. He does state that this has been on and off for about 3 weeks now but this morning it just didn't resolve and therefore he brought her into the ER. In the ER the symptoms persisted and I was called to do her admission for CVA. By the time I walked into the room the family states her symptoms had resolved. She has been taking a baby aspirin every day that has not had her Lipitor. She and admits to the fact that she has run out of some of her medications including Toprol Prilosec and Lipitor. She is in between doctors and because it took so long to transfer records from one doctor to another doctor and the fact that she has not been able to see the new doctor yet, neither of them aren't willing to prescribe her medicines for her.   Hospital Course:  1. Bilateral punctate acute/subacute infarcts- patient has remote  history of A. fib. She has been started on aspirin 81 mg daily along with Plavix 75 mg by mouth daily . Discussed with neurology, Dr. Pearlean Brownie recommended patient be seen by cardiology for further evaluation regarding TEE versus starting anticoagulation. Cardiology saw the patient and planed  r TEE, which did not show any thrombus in the left ventricle. Patient had worsening of the weakness of left hand, her blood pressure today was 132/76, she was started on Toprol-XL 50 mg at bedtime yesterday after discussion with neuro team. Neuro recommends to continue treating the hypertension. Continue aspirin 81 mg by mouth daily along with Plavix 75 mg by mouth daily. 2. Somnolence-improved after making gabapentin 300 mg by mouth at bedtime only. 3. Hypertension- Toprol-XL was held in the hospital due to CVA, at this time neurology recommends to treat the hypertension so we'll restart this medication at 100 mg by mouth daily 4. Diabetes mellitus- continue Lantus Glucotrol XL. We'll discontinue Amaryl at this time. 5. Depression with anxiety- continue Ativan and Paxil 6. Hypothyroidism-continue Synthroid  Patient will be discharged to rehabilitation facility at the [facility]  Procedures:  Transesophageal echo  Loop recorder placement by cardiology    Consultations:  Neurology  Cardiology  Discharge Exam: Filed Vitals:   10/18/14 1043  BP: 157/82  Pulse: 97  Temp: 98.2 F (36.8 C)  Resp: 18    General: Appears in no acute distress Cardiovascular: S1-S2 is regular Respiratory: Clear to auscultation bilaterally  Discharge Instructions  Discharge Instructions    Ambulatory referral to Neurology    Complete by:  As directed   Dr. Pearlean Brownie requests follow up for this patient in 2 months.     Diet - low sodium heart healthy    Complete by:  As directed      Increase activity slowly    Complete by:  As directed           Current Discharge Medication List    START taking these  medications   Details  atorvastatin (LIPITOR) 20 MG tablet Take 1 tablet (20 mg total) by mouth daily at 6 PM.    clopidogrel (PLAVIX) 75 MG tablet Take 1 tablet (75 mg total) by mouth daily.      CONTINUE these medications which have CHANGED   Details  gabapentin (NEURONTIN) 300 MG capsule Take 1 capsule (300 mg total) by mouth at bedtime.    LORazepam (ATIVAN) 1 MG tablet Take 1 tablet (1 mg total) by mouth 2 (two) times daily. Qty: 30 tablet, Refills: 0    metoprolol succinate (TOPROL-XL) 100 MG 24 hr tablet Take 1 tablet (100 mg total) by mouth daily. Qty: 30 tablet, Refills: 2      CONTINUE these medications which have NOT CHANGED   Details  acetaminophen (TYLENOL) 500 MG tablet Take 500 mg by mouth every 6 (six) hours as needed.      aspirin EC 81 MG tablet Take 81 mg by mouth daily.      cyanocobalamin (,VITAMIN B-12,) 1000 MCG/ML injection Inject 1,000 mcg into the muscle every 30 (thirty) days.      furosemide (LASIX) 40 MG tablet Take 60 mg by mouth daily.     glimepiride (AMARYL) 2 MG tablet Take 2 mg by mouth daily with breakfast.    glipiZIDE (GLUCOTROL XL) 10 MG 24 hr tablet Take 10 mg by mouth daily.      insulin glargine (LANTUS) 100 UNIT/ML injection Inject 62 Units into the skin at bedtime.    insulin regular (NOVOLIN R,HUMULIN R) 100 units/mL injection Inject 12 Units into the skin 3 (three) times daily before meals.     levothyroxine (SYNTHROID, LEVOTHROID) 125 MCG tablet Take 125 mcg by mouth daily.      losartan (COZAAR) 100 MG tablet Take 100 mg by mouth daily.      meloxicam (MOBIC) 7.5 MG tablet Take 7.5 mg by mouth daily.      Multiple Vitamins-Minerals (MULTIVITAMIN WITH MINERALS) tablet Take 1 tablet by mouth daily.    omeprazole (PRILOSEC) 20 MG capsule Take 20 mg by mouth daily.      PARoxetine (PAXIL) 20 MG tablet Take 20 mg by mouth every morning.      potassium chloride SA (K-DUR,KLOR-CON) 20 MEQ tablet Take 20 mEq by mouth daily.         STOP taking these medications     ergocalciferol (VITAMIN D2) 50000 UNITS capsule        Allergies  Allergen Reactions  . Ivp Dye [Iodinated Diagnostic Agents]   . Penicillins Swelling and Rash      The results of significant diagnostics from this hospitalization (including imaging, microbiology, ancillary and laboratory) are listed below for reference.    Significant Diagnostic Studies: Ct Head Wo Contrast  10/13/2014   CLINICAL DATA:  63 year old female who awoke at 1000 hours with left side weakness, left facial droop, eighth aphasia, headache. Initial encounter.  EXAM: CT HEAD WITHOUT CONTRAST  TECHNIQUE: Contiguous axial images were obtained from  the base of the skull through the vertex without intravenous contrast.  COMPARISON:  [facility] non contrast head CT 11/17/2010.  FINDINGS: Visualized paranasal sinuses and mastoids are clear. Calcified atherosclerosis at the skull base. No acute osseous abnormality identified. Stable and negative orbit and scalp soft tissues.  Progressed Patchy and confluent bilateral cerebral white matter hypodensity,. Chronic appearing lacunar infarcts affecting the deep gray matter nuclei. That involving the anterior left thalamus appears to of been present in 2012. A large but chronic appearing left basal ganglia lacunar infarct is new since that time.  No superimposed acute cortically based infarct identified. No midline shift, mass effect, or evidence of intracranial mass lesion. No ventriculomegaly. No acute intracranial hemorrhage identified. No suspicious intracranial vascular hyperdensity.  IMPRESSION: Progressed chronic small vessel ischemia since 2012. No acute cortically based infarct or acute intracranial hemorrhage identified.   Electronically Signed   By: Odessa Fleming M.D.   On: 10/13/2014 16:34   Mr [name] Head Wo Contrast  10/13/2014   CLINICAL DATA:  Left-sided weakness and slurred speech. Symptoms for 3 weeks. Facial droop and  occipital headache.  EXAM: MRI HEAD WITHOUT CONTRAST  MRA HEAD WITHOUT CONTRAST  TECHNIQUE: Multiplanar, multiecho pulse sequences of the brain and surrounding structures were obtained without intravenous contrast. Angiographic images of the head were obtained using MRA technique without contrast.  COMPARISON:  CT head without contrast 10/13/2014.  FINDINGS: MRI HEAD FINDINGS  At least 9 separate foci of restricted diffusion are present throughout both cerebral hemispheres. A lesion in the superior temporal gyrus demonstrates distinctly restricted diffusion. A lesion in the anterior right frontal lobe is more indistinct. Other lesions are along this spectrum, suggesting infarcts of the areas acute/subacute age over the last 1-2 weeks. The largest focus of lesions is in the posterior right frontal lobe. There are lesions in the posterior left frontal lobe white matter as well. There is no associated hemorrhage. T2 changes are associated.  A remote infarct involves the anterior portion of the left lentiform nucleus. There are remote lacunar infarcts in the thalami bilaterally. Remote white matter lacunar infarcts are present adjacent to the atrium of the lateral ventricles bilaterally. Confluent periventricular and subcortical white matter changes are evident otherwise as well.  There is no acute hemorrhage or mass lesion. Ex vacuo dilation is noted in the left lateral ventricle associated with the remote basal ganglia infarcts.  Flow is present in the major intracranial arteries. The right lens is replaced. The globes and orbits are otherwise intact. The paranasal sinuses are clear. There is minimal fluid in the left mastoid air cells. No obstructing nasopharyngeal lesion is evident.  MRA HEAD FINDINGS  Mild atherosclerotic irregularity is present within the cavernous internal carotid arteries bilaterally. The study is mildly degraded by motion. There is mild narrowing of proximal right A1 segment. The A1 and M1  segments are otherwise normal. The anterior communicating artery is patent. A high-grade stenosis is present in the mass posterior right M2 segment and the most anterior right left M2 segment. There is marked attenuation of MCA branch vessels bilaterally.  The vertebral arteries are codominant. There is moderate stenosis of the proximal right PICA. The left PICA origin is visualized and normal. The basilar artery is normal. Both posterior cerebral arteries originate from the basilar tip. Moderate attenuation of PCA branch vessels is present bilaterally, worse on the left. There is a mild distal right P2 segment stenosis.  IMPRESSION: 1. Multiple punctate acute/subacute infarcts of varying ages compatible  with a history of symptoms over the last 3 weeks. The largest cluster of infarcts is in the posterior right frontal lobe, compatible with left-sided numbness and weakness. 2. Other more remote infarcts are present within the left greater than right basal ganglia and thalami as well as within the deep white matter. 3. Extensive small vessel white matter changes are present bilaterally. 4. Extensive branch vessel stenoses and small vessel disease in both the anterior and posterior circulation. 5. Prominent proximal stenoses in M2 branches bilaterally. 6. No significant stenosis of the medium or large intracranial vessels proximal to the MCA bifurcations. These results were called by telephone at the time of interpretation on 10/13/2014 at 840 pm: To Georga Hacking, RN, who verbally acknowledged these results.   Electronically Signed   By: Marin Roberts M.D.   On: 10/13/2014 20:40   Mr Brain Wo Contrast  10/13/2014   CLINICAL DATA:  Left-sided weakness and slurred speech. Symptoms for 3 weeks. Facial droop and occipital headache.  EXAM: MRI HEAD WITHOUT CONTRAST  MRA HEAD WITHOUT CONTRAST  TECHNIQUE: Multiplanar, multiecho pulse sequences of the brain and surrounding structures were obtained without  intravenous contrast. Angiographic images of the head were obtained using MRA technique without contrast.  COMPARISON:  CT head without contrast 10/13/2014.  FINDINGS: MRI HEAD FINDINGS  At least 9 separate foci of restricted diffusion are present throughout both cerebral hemispheres. A lesion in the superior temporal gyrus demonstrates distinctly restricted diffusion. A lesion in the anterior right frontal lobe is more indistinct. Other lesions are along this spectrum, suggesting infarcts of the areas acute/subacute age over the last 1-2 weeks. The largest focus of lesions is in the posterior right frontal lobe. There are lesions in the posterior left frontal lobe white matter as well. There is no associated hemorrhage. T2 changes are associated.  A remote infarct involves the anterior portion of the left lentiform nucleus. There are remote lacunar infarcts in the thalami bilaterally. Remote white matter lacunar infarcts are present adjacent to the atrium of the lateral ventricles bilaterally. Confluent periventricular and subcortical white matter changes are evident otherwise as well.  There is no acute hemorrhage or mass lesion. Ex vacuo dilation is noted in the left lateral ventricle associated with the remote basal ganglia infarcts.  Flow is present in the major intracranial arteries. The right lens is replaced. The globes and orbits are otherwise intact. The paranasal sinuses are clear. There is minimal fluid in the left mastoid air cells. No obstructing nasopharyngeal lesion is evident.  MRA HEAD FINDINGS  Mild atherosclerotic irregularity is present within the cavernous internal carotid arteries bilaterally. The study is mildly degraded by motion. There is mild narrowing of proximal right A1 segment. The A1 and M1 segments are otherwise normal. The anterior communicating artery is patent. A high-grade stenosis is present in the mass posterior right M2 segment and the most anterior right left M2 segment.  There is marked attenuation of MCA branch vessels bilaterally.  The vertebral arteries are codominant. There is moderate stenosis of the proximal right PICA. The left PICA origin is visualized and normal. The basilar artery is normal. Both posterior cerebral arteries originate from the basilar tip. Moderate attenuation of PCA branch vessels is present bilaterally, worse on the left. There is a mild distal right P2 segment stenosis.  IMPRESSION: 1. Multiple punctate acute/subacute infarcts of varying ages compatible with a history of symptoms over the last 3 weeks. The largest cluster of infarcts is in the posterior right frontal  lobe, compatible with left-sided numbness and weakness. 2. Other more remote infarcts are present within the left greater than right basal ganglia and thalami as well as within the deep white matter. 3. Extensive small vessel white matter changes are present bilaterally. 4. Extensive branch vessel stenoses and small vessel disease in both the anterior and posterior circulation. 5. Prominent proximal stenoses in M2 branches bilaterally. 6. No significant stenosis of the medium or large intracranial vessels proximal to the MCA bifurcations. These results were called by telephone at the time of interpretation on 10/13/2014 at 840 pm: To Georga Hacking, RN, who verbally acknowledged these results.   Electronically Signed   By: Marin Roberts M.D.   On: 10/13/2014 20:40    Microbiology: No results found for this or any previous visit (from the past 240 hour(s)).   Labs: Basic Metabolic Panel:  Recent Labs Lab 10/13/14 1500 10/13/14 1532  NA 142 142  K 3.8 3.7  CL 102 101  CO2 28  --   GLUCOSE 148* 151*  BUN 16 18  CREATININE 0.83 0.90  CALCIUM 10.4  --    Liver Function Tests:  Recent Labs Lab 10/13/14 1500  AST 42*  ALT 27  ALKPHOS 89  BILITOT 1.0  PROT 7.3  ALBUMIN 3.9   No results for input(s): LIPASE, AMYLASE in the last 168 hours. No results for  input(s): AMMONIA in the last 168 hours. CBC:  Recent Labs Lab 10/13/14 1500 10/13/14 1532  WBC 9.0  --   NEUTROABS 5.3  --   HGB 14.2 15.6*  HCT 42.8 46.0  MCV 93.2  --   PLT 197  --    Cardiac Enzymes: No results for input(s): CKTOTAL, CKMB, CKMBINDEX, TROPONINI in the last 168 hours. BNP: BNP (last 3 results) No results for input(s): BNP in the last 8760 hours.  ProBNP (last 3 results) No results for input(s): PROBNP in the last 8760 hours.  CBG:  Recent Labs Lab 10/17/14 1109 10/17/14 1633 10/17/14 2126 10/18/14 0619 10/18/14 1128  GLUCAP 222* 228* 198* 175* 181*       Signed:  Monda Chastain S  [facility] 10/18/2014, 12:39 PM

## 2014-10-18 NOTE — Progress Notes (Signed)
Discharge orders received.  VSS upon discharge.  IV removed and education complete.  Transported out via [facility].  [facility]. Sondra ComeSilva, Sharifa Bucholz M, RN

## 2014-10-20 ENCOUNTER — Non-Acute Institutional Stay (SKILLED_NURSING_FACILITY): Payer: Medicare Other | Admitting: Internal Medicine

## 2014-10-20 DIAGNOSIS — E039 Hypothyroidism, unspecified: Secondary | ICD-10-CM

## 2014-10-20 DIAGNOSIS — E1342 Other specified diabetes mellitus with diabetic polyneuropathy: Secondary | ICD-10-CM | POA: Diagnosis not present

## 2014-10-20 DIAGNOSIS — I6990 Unspecified sequelae of unspecified cerebrovascular disease: Secondary | ICD-10-CM | POA: Diagnosis not present

## 2014-10-20 DIAGNOSIS — I10 Essential (primary) hypertension: Secondary | ICD-10-CM | POA: Diagnosis not present

## 2014-10-25 ENCOUNTER — Non-Acute Institutional Stay (SKILLED_NURSING_FACILITY): Payer: Medicare Other | Admitting: Internal Medicine

## 2014-10-25 ENCOUNTER — Ambulatory Visit (INDEPENDENT_AMBULATORY_CARE_PROVIDER_SITE_OTHER): Payer: Medicare Other | Admitting: *Deleted

## 2014-10-25 DIAGNOSIS — I639 Cerebral infarction, unspecified: Secondary | ICD-10-CM

## 2014-10-25 DIAGNOSIS — I6990 Unspecified sequelae of unspecified cerebrovascular disease: Secondary | ICD-10-CM

## 2014-10-25 DIAGNOSIS — E1342 Other specified diabetes mellitus with diabetic polyneuropathy: Secondary | ICD-10-CM

## 2014-10-25 DIAGNOSIS — I10 Essential (primary) hypertension: Secondary | ICD-10-CM | POA: Diagnosis not present

## 2014-10-25 LAB — MDC_IDC_ENUM_SESS_TYPE_INCLINIC
MDC IDC SET ZONE DETECTION INTERVAL: 2000 ms
Zone Setting Detection Interval: 360 ms
Zone Setting Detection Interval: 4500 ms

## 2014-10-25 NOTE — Progress Notes (Signed)
Patient ID: Dawn Ho, female   DOB: 23-Nov-1950, 64 y.o.   MRN: [id number]               HISTORY & PHYSICAL  DATE:  10/20/2014             FACILITY: [facility]                LEVEL OF CARE:   SNF   CHIEF COMPLAINT:  Admission to SNF, post stay at [facility], 10/13/2014 through 10/18/2014.    HISTORY OF PRESENT ILLNESS:  This is a 64 year-old woman who had apparently some prior history of stroke, although the patient tells me that she had no focal weakness.  She was able to do most of her own ADLs and walked with a cane at home.  She did not go out on her own, however.  Did not drive.    She presented to the hospital with slurred speech and right-sided weakness after having a headache the night before posteriorly.  Her husband noted that she had been slurring her words and had left-sided facial droop perhaps on and off for three weeks, but by this point things had not resolved and she came to the ER.  The physician states that by the time he came to admit her, her symptoms had "resolved".  The patient also admitted that she could not afford some of her medications and, therefore, was not able to take some of the medications that had been prescribed to her.    The patient apparently has a remote history of atrial fibrillation.  The patient was seen by Cardiology and she had a TEE that did not show any left ventricular thrombus.    She had worsening weakness of the left hand.  Her blood pressure was stable.  She was started on Toprol XL.  After discussion with Neurology, the patient was continued on aspirin and Plavix.    MRI of the brain revealed at least nine separate foci of restricted effusion present throughout both cerebral hemispheres.  Some of these suggested acute/subacute infarctions over the last 1-2 weeks.  The largest was in the posterior right frontal lobe.  There were remote infarcts in the bilateral thalamus, remote white matter infarcts adjacent to the head of the lateral  ventricles bilaterally, confluent periventricular and subcortical white matter changes.  There were no acute hemorrhages.  Flow was present in the major intracranial arteries.  Overall, there were multiple punctate acute/subacute infarcts of varying ages compatible with her symptoms over three weeks.  Extensive small vessel white matter disease, remote infarcts within the left to right basal ganglia as well as bilateral thalamus.  There was no significant stenosis of the medium or large cranial vessels.    LABORATORY DATA:  Lab work from review of the chart revealed:    Normal comprehensive metabolic panel.       Essentially normal CBC with differential.    PTT and PT were normal.    Hemoglobin A1c was 9.8.    Urine tox screen was negative.    Lipid profile revealed an LDL of 93, triglycerides of 358.       PAST MEDICAL HISTORY/PROBLEM LIST:                  Fibromyalgia.    Diabetes.    Hypertension.    Hyperlipidemia.    History of a TIA.    History of depression with anxiety.    History of stroke.  Hypothyroidism.  On replacement.     PAST SURGICAL HISTORY:      Cholecystectomy.    Cesarean section.    TEE without cardioversion.    CURRENT MEDICATIONS:  Medication list is reviewed.                    Lipitor 20 q.d.      Plavix 75 q.d.     Neurontin 300 q.h.s.    Ativan 1 mg p.o. b.i.d.      Toprol XL 100 q.d.       ASA 81 q.d.      Vitamin B12, 1000 every 30 days.    Lasix 60 q.d.       Amaryl 2 mg q.d.      Glucotrol XL 10 mg daily.     Synthroid 125 daily.    Losartan 100 q.d.     Mobic 7.5 q.d.     Prilosec 20 q.d.     Paxil 20 q.d.     Potassium 20 q.d.     Novolin R 12 U before meals.   Lantus 62 U subcu q.h.s.    SOCIAL HISTORY:                 HOUSING:  The patient tells me she lives in [place] with her husband.   FUNCTIONAL STATUS:  She was functional enough to be left alone during the day while he worked.   She claims to be  independent with ADLs.  Not able to get out of the house on her own.  Not driving.   TOBACCO USE:  The patient is a never-smoker.   ALCOHOL:  No alcohol use.   ILLICIT DRUGS:  No recreational drug use.    FAMILY HISTORY:   None related.  No history of heart disease.  No history of stroke in siblings or parents.    REVIEW OF SYSTEMS:   HEENT:  The patient denies headache.  No diplopia.  No swallowing difficulties.   CHEST/RESPIRATORY:  No shortness of breath.  CARDIAC:   No chest pain.    GI:  No abdominal pain or bowel changes.   GU:  No dysuria.  No bladder difficulties.   NEUROLOGICAL:   She notes weakness of her left arm.        PHYSICAL EXAMINATION:          VITAL SIGNS:   O2 SATURATIONS:  95% on room air.   RESPIRATIONS:  18 and unlabored.   PULSE:  82 and regular.   GENERAL APPEARANCE:  The patient is not in any distress.  She has already had a fall in the facility, but does not appear to have had any injury.     CHEST/RESPIRATORY:  Clear air entry bilaterally.   CARDIOVASCULAR:  CARDIAC:   Heart sounds are normal.  There are no carotid bruits.  No murmurs.   GASTROINTESTINAL:  ABDOMEN:   Obese.  No masses or tenderness.   VASCULAR:   ARTERIAL:  Extremities:  Peripheral pulses are palpable.   NEUROLOGICAL:    DEEP TENDON REFLEXES:  Reflexes are present at the knee jerks, but not at the ankle jerks; 1+ in the upper extremities.   SENSATION/STRENGTH:  She has a dense paralysis of the left arm.   She can move it proximally at the shoulder to some degree, but nothing distally.  The strength in her left leg seems actually fairly good.   CRANIAL NERVES:  There are no cranial  nerve abnormalities that I can detect.  No visual field abnormalities.   PSYCHIATRIC:   MENTAL STATUS:   The patient is somewhat depressed.  However, she seems to be able to give a very good account of her history.  Family was present in the room.    ASSESSMENT/PLAN:                               It would  appear that this lady developed very significant left upper extremity paralysis sometime after her hospital admission.  I do not really see this described anywhere in her record.  The rest of her neurologic exam seems fairly functional, although I did not attempt to ambulate her.  Although she has a history of AFib, a TEE showed no intraventricular thrombus.  I am not quite sure of the rationale for not anticoagulating her other than the Plavix and aspirin.    Type 2 diabetes.  Obviously, poorly controlled with morbid obesity.  Her hemoglobin A1c was 9.8.  We will review her blood sugars.    Hypertension.  We will monitor while she is here.    The patient is on Mobic 7.5 q.d.   I am not really sure of the rationale for this.  She, no doubt, has a chronic pain syndrome.  However, combinations of this plus Plavix and aspirin, not to mention the stroke risk in a patient who already has had strokes, will make this prohibitive.  I am going to discontinue this.    Hyperlipidemia.  On Lipitor.  This looked controlled in the hospital.    Probable diabetic neuropathy.    ?Pre- and post stroke depression.  This will need to be monitored fairly carefully.    On Lasix at 60 mg for reasons that are not quite clear.  Lab work will need to be checked.    Hypothyroidism.  On replacement.   I do not see a TSH, which I will order next week.    On a PPI.  Presumably for gastric protection.    I am going to discontinue the Mobic.    The rationale for Amaryl in the face of reasonably high doses of insulin really escapes me.  I will have a look at her CBGs before I make any changes.    I did not attempt to ambulate this woman, although I think there may be problems here.  The family tells me there are five steps to get into the house on each side, but no stairs once she is in.       CPT CODE: 40981

## 2014-10-25 NOTE — Progress Notes (Signed)
Wound check in clinic s/p ILR insertion. Wound well healed without redness or edema. Device function normal. Pt with 0 tachy episodes; 0 brady episodes; 0 asystole; 0 symptom; 0 AF episodes. Plan to continue [facility] FU QMO and with JA PRN. 

## 2014-10-30 NOTE — Progress Notes (Addendum)
Patient ID: Dawn Ho, female   DOB: 1951/08/20, 64 y.o.   MRN: [id number]                PROGRESS NOTE  DATE:  10/25/2014              [facility]                        LEVEL OF CARE:   SNF   Acute Visit                               CHIEF COMPLAINT:  Follow up diabetes, hypertension.        HISTORY OF PRESENT ILLNESS:  This is a patient whom I admitted to the building last week.    She has a remote history of AFib.    She presented with acute/subacute infarcts.  She was seen by Neurology and put on aspirin and Plavix.  She had a TEE that did not show any thrombus.    She is on large doses of insulin as well as Amaryl.  Her blood sugars are fairly well controlled early in the day.  However, they are progressively higher the rest of the day.  I am not sure that the Amaryl adds anything to this.  Yesterday's blood sugars were 116/232/162/349.  Today:  101/198.    In terms of her blood pressures, these are reasonably well controlled.    ASSESSMENT/PLAN:                                 Type 2 diabetes.  On large doses of insulin.  I am going to increase the amount of insulin a.c. lunch and dinner.  lantus to remain at current dose  Hypertension.  Her blood pressure is fairly stable.    I am awaiting blood work.

## 2014-11-09 ENCOUNTER — Encounter: Payer: Self-pay | Admitting: Internal Medicine

## 2014-11-10 ENCOUNTER — Ambulatory Visit: Payer: Medicare Other | Admitting: Family Medicine

## 2014-11-15 ENCOUNTER — Ambulatory Visit (INDEPENDENT_AMBULATORY_CARE_PROVIDER_SITE_OTHER): Payer: Medicare Other | Admitting: *Deleted

## 2014-11-15 DIAGNOSIS — I639 Cerebral infarction, unspecified: Secondary | ICD-10-CM

## 2014-11-16 NOTE — Progress Notes (Signed)
Loop recorder 

## 2014-11-16 NOTE — Telephone Encounter (Signed)
This encounter was created in error - please disregard.

## 2014-11-17 ENCOUNTER — Other Ambulatory Visit (HOSPITAL_COMMUNITY)
Admission: RE | Admit: 2014-11-17 | Discharge: 2014-11-17 | Disposition: A | Discharge status: Home / Self Care | Payer: Medicare Other | Source: Ambulatory Visit | Attending: Internal Medicine | Admitting: Internal Medicine

## 2014-11-17 ENCOUNTER — Observation Stay (HOSPITAL_COMMUNITY): Payer: Medicare Other

## 2014-11-17 ENCOUNTER — Emergency Department (HOSPITAL_COMMUNITY): Payer: Medicare Other

## 2014-11-17 ENCOUNTER — Inpatient Hospital Stay (HOSPITAL_COMMUNITY)
Admission: EM | Admit: 2014-11-17 | Discharge: 2014-11-22 | DRG: 065 | Disposition: A | Discharge status: Skilled Nursing Facility | Payer: Medicare Other | Attending: Internal Medicine | Admitting: Internal Medicine

## 2014-11-17 ENCOUNTER — Encounter (HOSPITAL_COMMUNITY): Payer: Self-pay | Admitting: *Deleted

## 2014-11-17 DIAGNOSIS — R000 Tachycardia, unspecified: Secondary | ICD-10-CM | POA: Insufficient documentation

## 2014-11-17 DIAGNOSIS — E785 Hyperlipidemia, unspecified: Secondary | ICD-10-CM | POA: Diagnosis present

## 2014-11-17 DIAGNOSIS — G8194 Hemiplegia, unspecified affecting left nondominant side: Secondary | ICD-10-CM | POA: Diagnosis not present

## 2014-11-17 DIAGNOSIS — M797 Fibromyalgia: Secondary | ICD-10-CM | POA: Diagnosis present

## 2014-11-17 DIAGNOSIS — Z7982 Long term (current) use of aspirin: Secondary | ICD-10-CM | POA: Diagnosis not present

## 2014-11-17 DIAGNOSIS — R29810 Facial weakness: Secondary | ICD-10-CM | POA: Diagnosis not present

## 2014-11-17 DIAGNOSIS — I10 Essential (primary) hypertension: Secondary | ICD-10-CM | POA: Diagnosis not present

## 2014-11-17 DIAGNOSIS — F418 Other specified anxiety disorders: Secondary | ICD-10-CM | POA: Diagnosis present

## 2014-11-17 DIAGNOSIS — E1159 Type 2 diabetes mellitus with other circulatory complications: Secondary | ICD-10-CM | POA: Diagnosis not present

## 2014-11-17 DIAGNOSIS — R531 Weakness: Secondary | ICD-10-CM | POA: Diagnosis not present

## 2014-11-17 DIAGNOSIS — Z79899 Other long term (current) drug therapy: Secondary | ICD-10-CM

## 2014-11-17 DIAGNOSIS — E860 Dehydration: Secondary | ICD-10-CM | POA: Diagnosis not present

## 2014-11-17 DIAGNOSIS — N3000 Acute cystitis without hematuria: Secondary | ICD-10-CM | POA: Diagnosis present

## 2014-11-17 DIAGNOSIS — E669 Obesity, unspecified: Secondary | ICD-10-CM | POA: Diagnosis not present

## 2014-11-17 DIAGNOSIS — Z794 Long term (current) use of insulin: Secondary | ICD-10-CM | POA: Diagnosis not present

## 2014-11-17 DIAGNOSIS — I6340 Cerebral infarction due to embolism of unspecified cerebral artery: Principal | ICD-10-CM | POA: Diagnosis present

## 2014-11-17 DIAGNOSIS — Z7902 Long term (current) use of antithrombotics/antiplatelets: Secondary | ICD-10-CM | POA: Diagnosis not present

## 2014-11-17 DIAGNOSIS — Z8673 Personal history of transient ischemic attack (TIA), and cerebral infarction without residual deficits: Secondary | ICD-10-CM

## 2014-11-17 DIAGNOSIS — Z91041 Radiographic dye allergy status: Secondary | ICD-10-CM

## 2014-11-17 DIAGNOSIS — D649 Anemia, unspecified: Secondary | ICD-10-CM | POA: Diagnosis not present

## 2014-11-17 DIAGNOSIS — M6289 Other specified disorders of muscle: Secondary | ICD-10-CM

## 2014-11-17 DIAGNOSIS — E039 Hypothyroidism, unspecified: Secondary | ICD-10-CM | POA: Diagnosis not present

## 2014-11-17 DIAGNOSIS — E1165 Type 2 diabetes mellitus with hyperglycemia: Secondary | ICD-10-CM | POA: Diagnosis not present

## 2014-11-17 DIAGNOSIS — Z6841 Body Mass Index (BMI) 40.0 and over, adult: Secondary | ICD-10-CM | POA: Diagnosis not present

## 2014-11-17 DIAGNOSIS — E08329 Diabetes mellitus due to underlying condition with mild nonproliferative diabetic retinopathy without macular edema: Secondary | ICD-10-CM | POA: Diagnosis not present

## 2014-11-17 DIAGNOSIS — Z8249 Family history of ischemic heart disease and other diseases of the circulatory system: Secondary | ICD-10-CM

## 2014-11-17 DIAGNOSIS — R404 Transient alteration of awareness: Secondary | ICD-10-CM | POA: Diagnosis not present

## 2014-11-17 DIAGNOSIS — I639 Cerebral infarction, unspecified: Secondary | ICD-10-CM

## 2014-11-17 DIAGNOSIS — E119 Type 2 diabetes mellitus without complications: Secondary | ICD-10-CM

## 2014-11-17 LAB — PROTIME-INR
INR: 1.05 (ref 0.00–1.49)
INR: 1.04 (ref 0.00–1.49)
Prothrombin Time: 13.8 seconds (ref 11.6–15.2)
Prothrombin Time: 13.7 seconds (ref 11.6–15.2)

## 2014-11-17 LAB — DIFFERENTIAL
BASOS ABS: 0.1 10*3/uL (ref 0.0–0.1)
BASOS PCT: 1 % (ref 0–1)
EOS ABS: 0.3 10*3/uL (ref 0.0–0.7)
Eosinophils Relative: 3 % (ref 0–5)
LYMPHS PCT: 22 % (ref 12–46)
Lymphs Abs: 2.2 10*3/uL (ref 0.7–4.0)
Monocytes Absolute: 0.6 10*3/uL (ref 0.1–1.0)
Monocytes Relative: 7 % (ref 3–12)
NEUTROS ABS: 6.8 10*3/uL (ref 1.7–7.7)
Neutrophils Relative %: 67 % (ref 43–77)

## 2014-11-17 LAB — URINE MICROSCOPIC-ADD ON

## 2014-11-17 LAB — COMPREHENSIVE METABOLIC PANEL
ALK PHOS: 81 U/L (ref 39–117)
ALT: 19 U/L (ref 0–35)
AST: 24 U/L (ref 0–37)
Albumin: 3.3 g/dL — ABNORMAL LOW (ref 3.5–5.2)
Anion gap: 7 (ref 5–15)
BUN: 12 mg/dL (ref 6–23)
CO2: 28 mmol/L (ref 19–32)
Calcium: 9.4 mg/dL (ref 8.4–10.5)
Chloride: 107 mmol/L (ref 96–112)
Creatinine, Ser: 1.05 mg/dL (ref 0.50–1.10)
GFR, EST AFRICAN AMERICAN: 64 mL/min — AB (ref >90)
GFR, EST NON AFRICAN AMERICAN: 55 mL/min — AB (ref >90)
GLUCOSE: 184 mg/dL — AB (ref 70–99)
Potassium: 3.7 mmol/L (ref 3.5–5.1)
Sodium: 142 mmol/L (ref 135–145)
TOTAL PROTEIN: 6.9 g/dL (ref 6.0–8.3)
Total Bilirubin: 0.6 mg/dL (ref 0.3–1.2)

## 2014-11-17 LAB — I-STAT TROPONIN, ED: Troponin i, poc: 0.01 ng/mL (ref 0.00–0.08)

## 2014-11-17 LAB — URINALYSIS, ROUTINE W REFLEX MICROSCOPIC
Bilirubin Urine: NEGATIVE
GLUCOSE, UA: NEGATIVE mg/dL
HGB URINE DIPSTICK: NEGATIVE
KETONES UR: NEGATIVE mg/dL
Nitrite: POSITIVE — AB
PH: 6 (ref 5.0–8.0)
Protein, ur: NEGATIVE mg/dL
Specific Gravity, Urine: 1.018 (ref 1.005–1.030)
UROBILINOGEN UA: 0.2 mg/dL (ref 0.0–1.0)

## 2014-11-17 LAB — TSH: TSH: 0.873 u[IU]/mL (ref 0.350–4.500)

## 2014-11-17 LAB — RAPID URINE DRUG SCREEN, HOSP PERFORMED
AMPHETAMINES: NOT DETECTED
Barbiturates: NOT DETECTED
Benzodiazepines: POSITIVE — AB
COCAINE: NOT DETECTED
Opiates: NOT DETECTED
TETRAHYDROCANNABINOL: NOT DETECTED

## 2014-11-17 LAB — ETHANOL

## 2014-11-17 LAB — I-STAT CHEM 8, ED
BUN: 16 mg/dL (ref 6–23)
CALCIUM ION: 1.14 mmol/L (ref 1.13–1.30)
CHLORIDE: 105 mmol/L (ref 96–112)
Creatinine, Ser: 0.9 mg/dL (ref 0.50–1.10)
Glucose, Bld: 187 mg/dL — ABNORMAL HIGH (ref 70–99)
HCT: 43 % (ref 36.0–46.0)
Hemoglobin: 14.6 g/dL (ref 12.0–15.0)
Potassium: 3.9 mmol/L (ref 3.5–5.1)
Sodium: 142 mmol/L (ref 135–145)
TCO2: 23 mmol/L (ref 0–100)

## 2014-11-17 LAB — CBC
HCT: 40.4 % (ref 36.0–46.0)
Hemoglobin: 13.4 g/dL (ref 12.0–15.0)
MCH: 31.2 pg (ref 26.0–34.0)
MCHC: 33.2 g/dL (ref 30.0–36.0)
MCV: 94 fL (ref 78.0–100.0)
PLATELETS: 203 10*3/uL (ref 150–400)
RBC: 4.3 MIL/uL (ref 3.87–5.11)
RDW: 14.1 % (ref 11.5–15.5)
WBC: 9.9 10*3/uL (ref 4.0–10.5)

## 2014-11-17 LAB — GLUCOSE, CAPILLARY: Glucose-Capillary: 209 mg/dL — ABNORMAL HIGH (ref 70–99)

## 2014-11-17 LAB — APTT: APTT: 31 s (ref 24–37)

## 2014-11-17 MED ORDER — INSULIN GLARGINE 100 UNIT/ML ~~LOC~~ SOLN
55.0000 [IU] | Freq: Every day | SUBCUTANEOUS | Status: DC
  Administered 2014-11-17 – 2014-11-18 (×2): 55 [IU] via SUBCUTANEOUS
  Filled 2014-11-17 (×3): qty 0.55

## 2014-11-17 MED ORDER — HYDROCODONE-ACETAMINOPHEN 5-325 MG PO TABS
1.0000 | ORAL_TABLET | ORAL | Status: DC | PRN
  Administered 2014-11-18: 2 via ORAL
  Administered 2014-11-20: 1 via ORAL
  Filled 2014-11-17: qty 1
  Filled 2014-11-17: qty 2

## 2014-11-17 MED ORDER — PANTOPRAZOLE SODIUM 40 MG PO TBEC
40.0000 mg | DELAYED_RELEASE_TABLET | Freq: Every day | ORAL | Status: DC
  Administered 2014-11-17 – 2014-11-22 (×6): 40 mg via ORAL
  Filled 2014-11-17 (×6): qty 1

## 2014-11-17 MED ORDER — CEFTRIAXONE SODIUM IN DEXTROSE 20 MG/ML IV SOLN
1.0000 g | INTRAVENOUS | Status: DC
  Administered 2014-11-17 – 2014-11-19 (×3): 1 g via INTRAVENOUS
  Filled 2014-11-17 (×3): qty 50

## 2014-11-17 MED ORDER — INSULIN ASPART 100 UNIT/ML ~~LOC~~ SOLN
0.0000 [IU] | Freq: Three times a day (TID) | SUBCUTANEOUS | Status: DC
  Administered 2014-11-18: 2 [IU] via SUBCUTANEOUS
  Administered 2014-11-18 (×2): 1 [IU] via SUBCUTANEOUS
  Administered 2014-11-19: 5 [IU] via SUBCUTANEOUS
  Administered 2014-11-19: 9 [IU] via SUBCUTANEOUS

## 2014-11-17 MED ORDER — HEPARIN SODIUM (PORCINE) 5000 UNIT/ML IJ SOLN
5000.0000 [IU] | Freq: Three times a day (TID) | INTRAMUSCULAR | Status: DC
  Administered 2014-11-17 – 2014-11-21 (×12): 5000 [IU] via SUBCUTANEOUS
  Filled 2014-11-17 (×11): qty 1

## 2014-11-17 MED ORDER — PAROXETINE HCL 20 MG PO TABS
20.0000 mg | ORAL_TABLET | Freq: Every day | ORAL | Status: DC
  Administered 2014-11-18 – 2014-11-22 (×5): 20 mg via ORAL
  Filled 2014-11-17 (×6): qty 1

## 2014-11-17 MED ORDER — SODIUM CHLORIDE 0.9 % IJ SOLN
3.0000 mL | Freq: Two times a day (BID) | INTRAMUSCULAR | Status: DC
  Administered 2014-11-18 – 2014-11-21 (×3): 3 mL via INTRAVENOUS

## 2014-11-17 MED ORDER — CLOPIDOGREL BISULFATE 75 MG PO TABS
75.0000 mg | ORAL_TABLET | Freq: Every day | ORAL | Status: DC
  Administered 2014-11-17 – 2014-11-22 (×6): 75 mg via ORAL
  Filled 2014-11-17 (×6): qty 1

## 2014-11-17 MED ORDER — ACETAMINOPHEN 325 MG PO TABS
650.0000 mg | ORAL_TABLET | Freq: Four times a day (QID) | ORAL | Status: DC | PRN
  Administered 2014-11-18 – 2014-11-21 (×5): 650 mg via ORAL
  Filled 2014-11-17 (×5): qty 2

## 2014-11-17 MED ORDER — LORAZEPAM 1 MG PO TABS
1.0000 mg | ORAL_TABLET | Freq: Two times a day (BID) | ORAL | Status: DC
  Administered 2014-11-17 – 2014-11-22 (×11): 1 mg via ORAL
  Filled 2014-11-17 (×3): qty 1
  Filled 2014-11-17: qty 2
  Filled 2014-11-17 (×7): qty 1

## 2014-11-17 MED ORDER — LEVOTHYROXINE SODIUM 125 MCG PO TABS
125.0000 ug | ORAL_TABLET | Freq: Every day | ORAL | Status: DC
  Administered 2014-11-18 – 2014-11-22 (×5): 125 ug via ORAL
  Filled 2014-11-17 (×6): qty 1

## 2014-11-17 MED ORDER — CYANOCOBALAMIN 1000 MCG/ML IJ SOLN
1000.0000 ug | INTRAMUSCULAR | Status: DC
Start: 2014-11-19 — End: 2014-11-22
  Administered 2014-11-19: 1000 ug via INTRAMUSCULAR
  Filled 2014-11-17: qty 1

## 2014-11-17 MED ORDER — LOSARTAN POTASSIUM 50 MG PO TABS
100.0000 mg | ORAL_TABLET | Freq: Every day | ORAL | Status: DC
  Administered 2014-11-17 – 2014-11-22 (×6): 100 mg via ORAL
  Filled 2014-11-17 (×6): qty 2

## 2014-11-17 MED ORDER — GABAPENTIN 300 MG PO CAPS
300.0000 mg | ORAL_CAPSULE | Freq: Every day | ORAL | Status: DC
  Administered 2014-11-17 – 2014-11-21 (×5): 300 mg via ORAL
  Filled 2014-11-17 (×5): qty 1

## 2014-11-17 MED ORDER — MORPHINE SULFATE 2 MG/ML IJ SOLN: 1.0000 mg | INTRAMUSCULAR | Status: DC | PRN

## 2014-11-17 MED ORDER — ATORVASTATIN CALCIUM 20 MG PO TABS
20.0000 mg | ORAL_TABLET | Freq: Every day | ORAL | Status: DC
  Administered 2014-11-17 – 2014-11-21 (×5): 20 mg via ORAL
  Filled 2014-11-17 (×3): qty 1
  Filled 2014-11-17 (×3): qty 2
  Filled 2014-11-17 (×2): qty 1
  Filled 2014-11-17: qty 2
  Filled 2014-11-17: qty 1
  Filled 2014-11-17 (×2): qty 2

## 2014-11-17 MED ORDER — POTASSIUM CHLORIDE CRYS ER 20 MEQ PO TBCR
20.0000 meq | EXTENDED_RELEASE_TABLET | Freq: Every day | ORAL | Status: DC
  Administered 2014-11-17 – 2014-11-22 (×6): 20 meq via ORAL
  Filled 2014-11-17 (×6): qty 1

## 2014-11-17 MED ORDER — INSULIN ASPART 100 UNIT/ML ~~LOC~~ SOLN
10.0000 [IU] | Freq: Three times a day (TID) | SUBCUTANEOUS | Status: DC
  Administered 2014-11-18 – 2014-11-20 (×7): 10 [IU] via SUBCUTANEOUS

## 2014-11-17 MED ORDER — ASPIRIN EC 81 MG PO TBEC
81.0000 mg | DELAYED_RELEASE_TABLET | Freq: Every day | ORAL | Status: DC
  Administered 2014-11-17 – 2014-11-22 (×6): 81 mg via ORAL
  Filled 2014-11-17 (×6): qty 1

## 2014-11-17 MED ORDER — ACETAMINOPHEN 650 MG RE SUPP: 650.0000 mg | Freq: Four times a day (QID) | RECTAL | Status: DC | PRN

## 2014-11-17 MED ORDER — FUROSEMIDE 40 MG PO TABS
60.0000 mg | ORAL_TABLET | Freq: Every day | ORAL | Status: DC
Start: 2014-11-17 — End: 2014-11-22
  Administered 2014-11-17 – 2014-11-22 (×6): 60 mg via ORAL
  Filled 2014-11-17 (×12): qty 1

## 2014-11-17 NOTE — Consult Note (Signed)
Referring Physician: Jodi MourningZavitz    Chief Complaint: Slurred speech, left sided numbness  HPI: Dawn Ho is an 64 y.o. female who reports that last evening she noted numbness on her left and blurred vision when she awakened from her sleep at 2300.  She went back to sleep and when she awakened this morning the staff noted she was dragging her left leg.  She also had a facial droop and had noted continued left sided numbness.  EMS was called at that time and the patient was brought in as a code stroke.    Date last known well: Date: 11/16/2014 Time last known well: Time: 21:00 tPA Given: No: outside treatment window, recent CVA  Past Medical History  Diagnosis Date  . Fibromyalgia   . Diabetes mellitus   . Hypertension   . Dyslipidemia   . TIA (transient ischemic attack)   . Depression with anxiety   . CVA (cerebral infarction)     Past Surgical History  Procedure Laterality Date  . Cholecystectomy    . Cesarean section    . Tee without cardioversion N/A 10/15/2014    Procedure: TRANSESOPHAGEAL ECHOCARDIOGRAM (TEE);  Surgeon: Pricilla RifflePaula Ross V, MD;  [facility];  Service: Cardiovascular;  Laterality: N/A;  . Loop recorder implant      Dr. Johney FrameAllred placed 10/15/2014    Family History  Problem Relation Age of Onset  . Heart disease Father    Social History:  reports that she has never smoked. She has never used smokeless tobacco. She reports that she does not drink alcohol or use illicit drugs.  Allergies:  Allergies  Allergen Reactions  . Ivp Dye [Iodinated Diagnostic Agents]   . Penicillins Swelling and Rash    Medications:  I have reviewed the patient's current medications. Prior to Admission:  Prescriptions prior to admission  Medication Sig Dispense Refill Last Dose  . acetaminophen (TYLENOL) 500 MG tablet Take 500 mg by mouth every 6 (six) hours as needed.     Past Week at Unknown time  . aspirin EC 81 MG tablet Take 81 mg by mouth daily.     11/16/2014 at Unknown  time  . atorvastatin (LIPITOR) 20 MG tablet Take 1 tablet (20 mg total) by mouth daily at 6 PM.   11/16/2014 at Unknown time  . clopidogrel (PLAVIX) 75 MG tablet Take 1 tablet (75 mg total) by mouth daily.   11/16/2014 at Unknown time  . cyanocobalamin (,VITAMIN B-12,) 1000 MCG/ML injection Inject 1,000 mcg into the muscle every 30 (thirty) days.     Past Month at Unknown time  . furosemide (LASIX) 40 MG tablet Take 60 mg by mouth daily.    11/16/2014 at Unknown time  . gabapentin (NEURONTIN) 300 MG capsule Take 1 capsule (300 mg total) by mouth at bedtime.   11/16/2014 at Unknown time  . glimepiride (AMARYL) 2 MG tablet Take 2 mg by mouth daily with breakfast.   11/16/2014 at Unknown time  . glipiZIDE (GLUCOTROL XL) 10 MG 24 hr tablet Take 10 mg by mouth daily.     11/16/2014 at Unknown time  . insulin glargine (LANTUS) 100 UNIT/ML injection Inject 55 Units into the skin at bedtime.    11/16/2014 at Unknown time  . insulin regular (NOVOLIN R,HUMULIN R) 100 units/mL injection Inject 12-15 Units into the skin 3 (three) times daily before meals. 12 units before breakfast, 15 units before lunch and dinner   11/16/2014 at Unknown time  . levothyroxine (SYNTHROID, LEVOTHROID) 125  MCG tablet Take 125 mcg by mouth daily.     11/16/2014 at Unknown time  . LORazepam (ATIVAN) 1 MG tablet Take 1 tablet (1 mg total) by mouth 2 (two) times daily. 30 tablet 0 11/16/2014 at Unknown time  . losartan (COZAAR) 100 MG tablet Take 100 mg by mouth daily.     11/16/2014 at Unknown time  . metoprolol succinate (TOPROL-XL) 100 MG 24 hr tablet Take 1 tablet (100 mg total) by mouth daily. 30 tablet 2 11/16/2014 at 0800  . Multiple Vitamins-Minerals (MULTIVITAMIN WITH MINERALS) tablet Take 1 tablet by mouth daily.   11/16/2014 at Unknown time  . pantoprazole (PROTONIX) 40 MG tablet Take 40 mg by mouth daily.   11/17/2014 at Unknown time  . PARoxetine (PAXIL) 20 MG tablet Take 20 mg by mouth every morning.     11/17/2014 at Unknown time  .  potassium chloride SA (K-DUR,KLOR-CON) 20 MEQ tablet Take 20 mEq by mouth daily.     11/17/2014 at Unknown time   Scheduled: . aspirin EC  81 mg Oral Daily  . atorvastatin  20 mg Oral q1800  . cefTRIAXone (ROCEPHIN)  IV  1 g Intravenous Q24H  . clopidogrel  75 mg Oral Daily  . [START ON 11/19/2014] cyanocobalamin  1,000 mcg Intramuscular Q30 days  . furosemide  60 mg Oral Daily  . gabapentin  300 mg Oral QHS  . heparin  5,000 Units Subcutaneous 3 times per day  . insulin glargine  55 Units Subcutaneous QHS  . levothyroxine  125 mcg Oral Daily  . LORazepam  1 mg Oral BID  . losartan  100 mg Oral Daily  . pantoprazole  40 mg Oral Daily  . [START ON 11/18/2014] PARoxetine  20 mg Oral Daily  . potassium chloride SA  20 mEq Oral Daily  . sodium chloride  3 mL Intravenous Q12H    ROS: History obtained from the patient  General ROS: negative for - chills, fatigue, fever, night sweats, weight gain or weight loss Psychological ROS: negative for - behavioral disorder, hallucinations, memory difficulties, mood swings or suicidal ideation Ophthalmic ROS: negative for - blurry vision, double vision, eye pain or loss of vision ENT ROS: negative for - epistaxis, nasal discharge, oral lesions, sore throat, tinnitus or vertigo Allergy and Immunology ROS: negative for - hives or itchy/watery eyes Hematological and Lymphatic ROS: negative for - bleeding problems, bruising or swollen lymph nodes Endocrine ROS: negative for - galactorrhea, hair pattern changes, polydipsia/polyuria or temperature intolerance Respiratory ROS: negative for - cough, hemoptysis, shortness of breath or wheezing Cardiovascular ROS: negative for - chest pain, dyspnea on exertion, edema or irregular heartbeat Gastrointestinal ROS: negative for - abdominal pain, diarrhea, hematemesis, nausea/vomiting or stool incontinence Genito-Urinary ROS: negative for - dysuria, hematuria, incontinence or urinary frequency/urgency Musculoskeletal  ROS: negative for - joint swelling or muscular weakness Neurological ROS: as noted in HPI Dermatological ROS: negative for rash and skin lesion changes  Physical Examination: Blood pressure 166/69, pulse 69, temperature 98.4 F (36.9 C), temperature source Oral, resp. rate 20, height 5' (1.524 m), weight 96.389 kg (212 lb 8 oz), SpO2 94 %.  HEENT-  Normocephalic, no lesions, without obvious abnormality.  Normal external eye and conjunctiva.  Normal TM's bilaterally.  Normal auditory canals and external ears. Normal external nose, mucus membranes and septum.  Normal pharynx. Cardiovascular- S1, S2 normal, pulses palpable throughout   Lungs- chest clear, no wheezing, rales, normal symmetric air entry Abdomen- soft, non-tender; bowel sounds normal; no  masses,  no organomegaly Extremities- no edema Lymph-no adenopathy palpable Musculoskeletal-no joint tenderness, deformity or swelling Skin-warm and dry, no hyperpigmentation, vitiligo, or suspicious lesions  Neurological Examination Mental Status: Alert, oriented, thought content appropriate.  Speech fluent without evidence of aphasia but slurred.  Able to follow 3 step commands without difficulty. Cranial Nerves: II: Discs flat bilaterally; Visual fields grossly normal, pupils equal, round, reactive to light and accommodation III,IV, VI: ptosis not present, extra-ocular motions intact bilaterally V,VII: smile symmetric, facial light touch sensation normal bilaterally VIII: hearing normal bilaterally IX,X: gag reflex present XI: bilateral shoulder shrug XII: midline tongue extension Motor: Right : Upper extremity   5/5    Left:     Upper extremity   4+/5  Lower extremity   5/5     Lower extremity   5-/5 Tone and bulk:normal tone throughout; no atrophy noted Sensory: Pinprick and light touch intact throughout, bilaterally Deep Tendon Reflexes: 2+ and symmetric throughout Plantars: Right: downgoing   Left: downgoing Cerebellar: normal  finger-to-nose and normal heel-to-shin testing bilaterally Gait: not tested due to safety concerns      Laboratory Studies:  Basic Metabolic Panel:  Recent Labs Lab 11/17/14 0928 11/17/14 0935  NA 142 142  K 3.7 3.9  CL 107 105  CO2 28  --   GLUCOSE 184* 187*  BUN 12 16  CREATININE 1.05 0.90  CALCIUM 9.4  --     Liver Function Tests:  Recent Labs Lab 11/17/14 0928  AST 24  ALT 19  ALKPHOS 81  BILITOT 0.6  PROT 6.9  ALBUMIN 3.3*   No results for input(s): LIPASE, AMYLASE in the last 168 hours. No results for input(s): AMMONIA in the last 168 hours.  CBC:  Recent Labs Lab 11/17/14 0928 11/17/14 0935  WBC 9.9  --   NEUTROABS 6.8  --   HGB 13.4 14.6  HCT 40.4 43.0  MCV 94.0  --   PLT 203  --     Cardiac Enzymes: No results for input(s): CKTOTAL, CKMB, CKMBINDEX, TROPONINI in the last 168 hours.  BNP: Invalid input(s): POCBNP  CBG: No results for input(s): GLUCAP in the last 168 hours.  Microbiology: Results for orders placed or performed during the hospital encounter of 01/07/08  Urine culture     Status: None   Collection Time: 01/07/08  9:06 PM  Result Value Ref Range Status   Specimen Description URINE, RANDOM  Final   Special Requests IMMUNE:NORM UT SYMPT:NEG  Final   Colony Count >=100,000 COLONIES/ML  Final   Culture   Final    Multiple bacterial morphotypes present, none predominant. Suggest appropriate recollection if clinically indicated.   Report Status 01/09/2008 FINAL  Final  Clostridium difficile EIA     Status: None   Collection Time: 01/07/08  9:10 PM  Result Value Ref Range Status   Specimen Description STOOL  Final   Special Requests IMMUNE:NORM  Final   C difficile Toxins A+B, EIA NEGATIVE  Final   Report Status 01/08/2008 FINAL  Final    Coagulation Studies:  Recent Labs  11/17/14 0928  LABPROT 13.8  INR 1.05    Urinalysis:  Recent Labs Lab 11/17/14 1100  COLORURINE YELLOW  LABSPEC 1.018  PHURINE 6.0   GLUCOSEU NEGATIVE  HGBUR NEGATIVE  BILIRUBINUR NEGATIVE  KETONESUR NEGATIVE  PROTEINUR NEGATIVE  UROBILINOGEN 0.2  NITRITE POSITIVE*  LEUKOCYTESUR MODERATE*    Lipid Panel:    Component Value Date/Time   CHOL 207* 10/14/2014 0419   TRIG  358* 10/14/2014 0419   HDL 42 10/14/2014 0419   CHOLHDL 4.9 10/14/2014 0419   VLDL 72* 10/14/2014 0419   LDLCALC 93 10/14/2014 0419    HgbA1C:  Lab Results  Component Value Date   HGBA1C 9.8* 10/14/2014    Urine Drug Screen:     Component Value Date/Time   LABOPIA NONE DETECTED 11/17/2014 1100   COCAINSCRNUR NONE DETECTED 11/17/2014 1100   LABBENZ POSITIVE* 11/17/2014 1100   AMPHETMU NONE DETECTED 11/17/2014 1100   THCU NONE DETECTED 11/17/2014 1100   LABBARB NONE DETECTED 11/17/2014 1100    Alcohol Level:  Recent Labs Lab 11/17/14 0928  ETH <5     Imaging: Ct Head Wo Contrast  11/17/2014   CLINICAL DATA:  Code stroke. Left-sided weakness and left facial droop.  EXAM: CT HEAD WITHOUT CONTRAST  TECHNIQUE: Contiguous axial images were obtained from the base of the skull through the vertex without intravenous contrast.  COMPARISON:  MRI 10/13/2014, CT 10/13/2014  FINDINGS: Ventricle size is normal.  Cerebral volume normal for age  Multiple areas of chronic ischemia. Chronic infarct left anterior basal ganglia is unchanged. Chronic ischemia throughout the cerebral white matter bilaterally also unchanged.  Negative for acute ischemic infarction. Negative for hemorrhage or mass.  Calvarium intact.  IMPRESSION: Chronic ischemic changes are stable.  No acute abnormality.  Critical Value/emergent results were called by telephone at the time of interpretation on 11/17/2014 at 9:39 am to Dr. Thad Ranger , who verbally acknowledged these results.   Electronically Signed   By: Marlan Palau M.D.   On: 11/17/2014 09:39    Assessment: 64 y.o. female s/p multiple small infarcts in February of this year who presents today with worsening left sided  symptoms and slurred speech.  Head CT personally reviewed and shows no acute changes.  Patient with recent unremarkable stroke work up in February.  On ASA and Plavix at home.  Loop monitor in place.  Although recurrent infarct possible patient with a UTI and may have unmasking of her previous symptoms due to this since this was her previous presentation.    Stroke Risk Factors - diabetes mellitus, hyperlipidemia, hypertension and recent infarct  Plan: 1. PT consult, OT consult, Speech consult 2. Continue ASA and Plavix 3. NPO until RN stroke swallow screen 4. Telemetry monitoring 5. Frequent neuro checks 6. Interrogate Loop monitor 7. MRI of the brain without contrast  Case discussed with Dr. Karen Kitchens, MD [facility] [phone] 11/17/2014, 12:39 PM

## 2014-11-17 NOTE — Significant Event (Signed)
Notified by our and that urinalysis positive. Reviewed urinalysis, consistent with UTI, culture sent and started on Rocephin.  Clint LippsMutaz A Richey Doolittle Pager: [phone]: [phone] 11/17/2014, 12:19 PM

## 2014-11-17 NOTE — ED Notes (Signed)
[facility]. Pt had a stroke 1 month ago and is receiving rehab for left arm weakness. This morning at 0730 PT came to work with pt and noticed left sided facial droop, left sided leg and arm weakness with pt dragging her left leg while attempting to ambulate. Pt stroke sx from last month were slurred speech, left arm and leg weakness with facial droop. When pt was d/c her left arm weakness was the only remaining sx and pt states it has been getting better with therapy.

## 2014-11-17 NOTE — Progress Notes (Signed)
Clinical Social Work Department BRIEF PSYCHOSOCIAL ASSESSMENT 11/17/2014  Patient:  Dawn Ho, Dawn Ho     Account Number:  [id number]     Admit date:  11/17/2014  Clinical Social Worker:  Forest Gleason  Date/Time:  11/17/2014 10:27 AM  Referred by:  Physician  Date Referred:  11/17/2014 Referred for  Psychosocial assessment   Other Referral:   Patient is from a facility   Interview type:  Patient Other interview type:   patient family in room (sister and son)    PSYCHOSOCIAL DATA Living Status:  FACILITY Admitted from facility:  [facility] Level of care:  [place] Primary support name:  Raiza Kiesel Primary support relationship to patient:  FAMILY Degree of support available:   High level of support with family at the bedside.  Facility also provides high level of support    CURRENT CONCERNS Current Concerns  Post-Acute Placement   Other Concerns:   NA    SOCIAL WORK ASSESSMENT / PLAN LCSW recieved consult that patient is from a facility. Met with patient and family to confirm SNF. patient is a resident of [facility] in [place]. Patient will return to facility at time of DC per family. Family content with care and needs met at facility, with no barriers at this time to return.  Facility called, alerted of patient being admitted for ?code stroke/stroke symptoms. Facility alerted SW about patients positive UDS for UTI.  LCSW let RN know and they are going to complete another urine to confirm.  Patient will be transported to unit once bed available. LCSW will transfer case to unit CSW.  FL2 to be completed upon return to SNF when medically stable.   Assessment/plan status:  Psychosocial Support/Ongoing Assessment of Needs Other assessment/ plan:   ?PT/OT if patient is not at baseline prior to DC   Information/referral to community resources:   FL2 updated    PATIENT'S/FAMILY'S RESPONSE TO PLAN OF CARE: Patient and  family agreeable to plan. Calm and cooperative waiting with patient in room. patient reports feeling tired and slightly confused, but family is very supportive.  No needs per family at this time. Agreeable to hold bed at [facility]  Family very appropriate and supportive to patient.    Lane Hacker, MSW Clinical Social Work: Emergency Room [phone]

## 2014-11-17 NOTE — Progress Notes (Signed)
Patient arrived on unit. Have been orientated to unit and room.  Telemetry monitoring was initiated.  Will monitor patient.

## 2014-11-17 NOTE — Code Documentation (Signed)
64yo [facility] at [phone].  EMS reports that the patient woke up with right facial numbness and weakness then her therapist at the facility noticed that patient was dragging her left leg when ambulating.  Patient reports that she noticed her vision to be blurred and weakness last night at 2300 when she woke up during the night.  She reports going to bed at 2100 at her baseline.  Patient to CT.  Stroke team to the bedside.  NIHSS 5, see documentation for details and code stroke times.  Patient with left facial droop, left arm drift and dysarthria.  Patient with history of stroke with left sided deficits one month ago and is at a facility for therapy.  Patient is outside the window for treatment with tPA and contraindicated for tPA d/t stroke within 3 months.  No acute stroke treatment per Dr. Thad Rangereynolds.  Patient to have loop recorder interrogated and then MRI completed per Dr. Thad Rangereynolds.  Bedside handoff with ED RN Joni ReiningNicole.

## 2014-11-17 NOTE — H&P (Signed)
[facility] History and Physical  Dawn Ho ZOX:[id number] DOB: June 29, 1951 DOA: 11/17/2014  Referring physician: Jodi Mourning PCP: Frederica Kuster, MD   Chief Complaint: Left-sided weakness  HPI: Dawn Ho is a 64 y.o. female with past medical history of obesity, fibromyalgia and recent stroke. Patient was in the hospital for left-sided weakness diagnosed with thromboembolic strokes and died discharge from the hospital on 10/18/2014 to local nursing home. Patient was doing okay without any problems and per her and per her husband at bedside she started to get up and walk around with some help. Patient said since yesterday she started to see things "funny", upon further questioning she said probably double vision, she also complained about worsening of the left side weakness which is started to improve. In the ED CBC and BMP look okay, troponin is negative, seen by neurology and recommended MRI of the brain at least observation overnight.  Review of Systems:  Constitutional: negative for anorexia, fevers and sweats Eyes: negative for irritation, redness and visual disturbance Ears, nose, mouth, throat, and face: negative for earaches, epistaxis, nasal congestion and sore throat Respiratory: negative for cough, dyspnea on exertion, sputum and wheezing Cardiovascular: negative for chest pain, dyspnea, lower extremity edema, orthopnea, palpitations and syncope Gastrointestinal: negative for abdominal pain, constipation, diarrhea, melena, nausea and vomiting Genitourinary:negative for dysuria, frequency and hematuria Hematologic/lymphatic: negative for bleeding, easy bruising and lymphadenopathy Musculoskeletal:negative for arthralgias, muscle weakness and stiff joints Neurological: negative for coordination problems, gait problems, headaches and weakness Endocrine: negative for diabetic symptoms including polydipsia, polyuria and weight loss Allergic/Immunologic: negative for  anaphylaxis, hay fever and urticaria  Past Medical History  Diagnosis Date  . Fibromyalgia   . Diabetes mellitus   . Hypertension   . Dyslipidemia   . TIA (transient ischemic attack)   . Depression with anxiety   . CVA (cerebral infarction)    Past Surgical History  Procedure Laterality Date  . Cholecystectomy    . Cesarean section    . Tee without cardioversion N/A 10/15/2014    Procedure: TRANSESOPHAGEAL ECHOCARDIOGRAM (TEE);  Surgeon: Pricilla Riffle, MD;  Location: [facility] ENDOSCOPY;  Service: Cardiovascular;  Laterality: N/A;   Social History:   reports that she has never smoked. She has never used smokeless tobacco. She reports that she does not drink alcohol or use illicit drugs.  Allergies  Allergen Reactions  . Ivp Dye [Iodinated Diagnostic Agents]   . Penicillins Swelling and Rash    Family History  Problem Relation Age of Onset  . Heart disease Father      Prior to Admission medications   Medication Sig Start Date End Date Taking? Authorizing Provider  acetaminophen (TYLENOL) 500 MG tablet Take 500 mg by mouth every 6 (six) hours as needed.     Yes Historical Provider, MD  aspirin EC 81 MG tablet Take 81 mg by mouth daily.     Yes Historical Provider, MD  atorvastatin (LIPITOR) 20 MG tablet Take 1 tablet (20 mg total) by mouth daily at 6 PM. 10/18/14  Yes Meredeth Ide, MD  clopidogrel (PLAVIX) 75 MG tablet Take 1 tablet (75 mg total) by mouth daily. 10/18/14  Yes Meredeth Ide, MD  cyanocobalamin (,VITAMIN B-12,) 1000 MCG/ML injection Inject 1,000 mcg into the muscle every 30 (thirty) days.     Yes Historical Provider, MD  furosemide (LASIX) 40 MG tablet Take 60 mg by mouth daily.    Yes Historical Provider, MD  gabapentin (NEURONTIN) 300 MG capsule Take 1  capsule (300 mg total) by mouth at bedtime. 10/18/14  Yes Meredeth Ide, MD  glimepiride (AMARYL) 2 MG tablet Take 2 mg by mouth daily with breakfast.   Yes Historical Provider, MD  glipiZIDE (GLUCOTROL XL) 10 MG 24 hr  tablet Take 10 mg by mouth daily.     Yes Historical Provider, MD  insulin glargine (LANTUS) 100 UNIT/ML injection Inject 55 Units into the skin at bedtime.    Yes Historical Provider, MD  insulin regular (NOVOLIN R,HUMULIN R) 100 units/mL injection Inject 12-15 Units into the skin 3 (three) times daily before meals. 12 units before breakfast, 15 units before lunch and dinner   Yes Historical Provider, MD  levothyroxine (SYNTHROID, LEVOTHROID) 125 MCG tablet Take 125 mcg by mouth daily.     Yes Historical Provider, MD  LORazepam (ATIVAN) 1 MG tablet Take 1 tablet (1 mg total) by mouth 2 (two) times daily. 10/18/14  Yes Meredeth Ide, MD  losartan (COZAAR) 100 MG tablet Take 100 mg by mouth daily.     Yes Historical Provider, MD  metoprolol succinate (TOPROL-XL) 100 MG 24 hr tablet Take 1 tablet (100 mg total) by mouth daily. 10/18/14  Yes Meredeth Ide, MD  Multiple Vitamins-Minerals (MULTIVITAMIN WITH MINERALS) tablet Take 1 tablet by mouth daily.   Yes Historical Provider, MD  pantoprazole (PROTONIX) 40 MG tablet Take 40 mg by mouth daily.   Yes Historical Provider, MD  PARoxetine (PAXIL) 20 MG tablet Take 20 mg by mouth every morning.     Yes Historical Provider, MD  potassium chloride SA (K-DUR,KLOR-CON) 20 MEQ tablet Take 20 mEq by mouth daily.     Yes Historical Provider, MD   Physical Exam: Filed Vitals:   11/17/14 1018  BP:   Pulse:   Temp: 98.8 F (37.1 C)  Resp:    Constitutional: Oriented to person, place, and time. Well-developed and well-nourished. Cooperative.  Head: Normocephalic and atraumatic.  Nose: Nose normal.  Mouth/Throat: Uvula is midline, oropharynx is clear and moist and mucous membranes are normal.  Eyes: Conjunctivae and EOM are normal. Pupils are equal, round, and reactive to light.  Neck: Trachea normal and normal range of motion. Neck supple.  Cardiovascular: Normal rate, regular rhythm, S1 normal, S2 normal, normal heart sounds and intact distal pulses.     Pulmonary/Chest: Effort normal and breath sounds normal.  Abdominal: Soft. Bowel sounds are normal. There is no hepatosplenomegaly. There is no tenderness.  Musculoskeletal: Normal range of motion.  Neurological: Alert and oriented to person, place, and time. Has normal strength. No cranial nerve deficit or sensory deficit.  Skin: Skin is warm, dry and intact.  Psychiatric: Has a normal mood and affect. Speech is normal and behavior is normal.   Labs on Admission:  Basic Metabolic Panel:  Recent Labs Lab 11/17/14 0928 11/17/14 0935  NA 142 142  K 3.7 3.9  CL 107 105  CO2 28  --   GLUCOSE 184* 187*  BUN 12 16  CREATININE 1.05 0.90  CALCIUM 9.4  --    Liver Function Tests:  Recent Labs Lab 11/17/14 0928  AST 24  ALT 19  ALKPHOS 81  BILITOT 0.6  PROT 6.9  ALBUMIN 3.3*   No results for input(s): LIPASE, AMYLASE in the last 168 hours. No results for input(s): AMMONIA in the last 168 hours. CBC:  Recent Labs Lab 11/17/14 0928 11/17/14 0935  WBC 9.9  --   NEUTROABS 6.8  --   HGB 13.4 14.6  HCT 40.4 43.0  MCV 94.0  --   PLT 203  --    Cardiac Enzymes: No results for input(s): CKTOTAL, CKMB, CKMBINDEX, TROPONINI in the last 168 hours.  BNP (last 3 results) No results for input(s): BNP in the last 8760 hours.  ProBNP (last 3 results) No results for input(s): PROBNP in the last 8760 hours.  CBG: No results for input(s): GLUCAP in the last 168 hours.  Radiological Exams on Admission: Ct Head Wo Contrast  11/17/2014   CLINICAL DATA:  Code stroke. Left-sided weakness and left facial droop.  EXAM: CT HEAD WITHOUT CONTRAST  TECHNIQUE: Contiguous axial images were obtained from the base of the skull through the vertex without intravenous contrast.  COMPARISON:  MRI 10/13/2014, CT 10/13/2014  FINDINGS: Ventricle size is normal.  Cerebral volume normal for age  Multiple areas of chronic ischemia. Chronic infarct left anterior basal ganglia is unchanged. Chronic ischemia  throughout the cerebral white matter bilaterally also unchanged.  Negative for acute ischemic infarction. Negative for hemorrhage or mass.  Calvarium intact.  IMPRESSION: Chronic ischemic changes are stable.  No acute abnormality.  Critical Value/emergent results were called by telephone at the time of interpretation on 11/17/2014 at 9:39 am to Dr. Thad Ranger , who verbally acknowledged these results.   Electronically Signed   By: Marlan Palau M.D.   On: 11/17/2014 09:39    EKG: Independently reviewed. HR 71, appears to be sinus rhythm, machine reading is atrial flutter, anyway patient has loop recorder.  Assessment/Plan Principal Problem:   Left-sided weakness Active Problems:   Fibromyalgia   Diabetes mellitus   Obesity   Stroke    Left-sided weakness Patient has left-sided weakness because of recent stroke, he was improving with PT, presented with worsening. Patient was able to walk with some help she does not know if she can do that right now. Seen by neurology, recommended MRI of the brain. Patient is on aspirin and Plavix, stroke cause was unknown. Patient has loop recorder. We'll follow neurology recommendations regarding anticoagulation.  Diabetes mellitus type 2 Insulin-dependent diabetes mellitus type 2, patient is on 55 units of Lantus at night. Currently nothing by mouth pending nursing swallow evaluation. If passes patient will be on carbohydrate modified diet  Fibromyalgia Continue home medications including Neurontin.  Dyslipidemia Continue home statin.   Code Status: Full code Family Communication: Plan discussed with the patient in the presence of her husband Adela Lank at bedside. Disposition Plan:  Neuro telemetry, observation  Time spent: 70 minutes  Nevaeh Korte A, MD [facility] Pager [phone]

## 2014-11-17 NOTE — ED Notes (Signed)
Attempted report to 4N   

## 2014-11-17 NOTE — ED Provider Notes (Signed)
CSN: [id number]     Arrival date & time 11/17/14  0911 History   First MD Initiated Contact with Patient 11/17/14 0914     Chief Complaint  Patient presents with  . Code Stroke     (Consider location/radiation/quality/duration/timing/severity/associated sxs/prior Treatment) HPI Comments: 64 year-old female with history of fibromyalgia on a recent stroke with left-sided weakness, TIA, sent over from rehabilitation facility after worsening left-sided weakness noticed at 7:30 this morning. Clarified with patient she said it started last night. Patient was getting both rehabilitation and this is new and more significant. Code stroke was called in the field based on initial report. Patient on Plavix and aspirin. No falls or head injuries. Symptoms constant  The history is provided by the patient and medical records.    Past Medical History  Diagnosis Date  . Fibromyalgia   . Diabetes mellitus   . Hypertension   . Dyslipidemia   . TIA (transient ischemic attack)   . Depression with anxiety   . CVA (cerebral infarction)    Past Surgical History  Procedure Laterality Date  . Cholecystectomy    . Cesarean section    . Tee without cardioversion N/A 10/15/2014    Procedure: TRANSESOPHAGEAL ECHOCARDIOGRAM (TEE);  Surgeon: Pricilla Riffle, MD;  Location: [facility] ENDOSCOPY;  Service: Cardiovascular;  Laterality: N/A;  . Loop recorder implant      Dr. Johney Frame placed 10/15/2014   Family History  Problem Relation Age of Onset  . Heart disease Father    History  Substance Use Topics  . Smoking status: Never Smoker   . Smokeless tobacco: Never Used  . Alcohol Use: No   OB History    No data available     Review of Systems  Constitutional: Negative for fever and chills.  HENT: Negative for congestion.   Eyes: Negative for visual disturbance.  Respiratory: Negative for shortness of breath.   Cardiovascular: Negative for chest pain.  Gastrointestinal: Negative for vomiting and abdominal pain.   Genitourinary: Negative for dysuria and flank pain.  Musculoskeletal: Negative for back pain, neck pain and neck stiffness.  Skin: Negative for rash.  Neurological: Positive for weakness. Negative for light-headedness and headaches.      Allergies  Ivp dye and Penicillins  Home Medications   Prior to Admission medications   Medication Sig Start Date End Date Taking? Authorizing Provider  acetaminophen (TYLENOL) 500 MG tablet Take 500 mg by mouth every 6 (six) hours as needed.     Yes Historical Provider, MD  aspirin EC 81 MG tablet Take 81 mg by mouth daily.     Yes Historical Provider, MD  atorvastatin (LIPITOR) 20 MG tablet Take 1 tablet (20 mg total) by mouth daily at 6 PM. 10/18/14  Yes Meredeth Ide, MD  clopidogrel (PLAVIX) 75 MG tablet Take 1 tablet (75 mg total) by mouth daily. 10/18/14  Yes Meredeth Ide, MD  cyanocobalamin (,VITAMIN B-12,) 1000 MCG/ML injection Inject 1,000 mcg into the muscle every 30 (thirty) days.     Yes Historical Provider, MD  furosemide (LASIX) 40 MG tablet Take 60 mg by mouth daily.    Yes Historical Provider, MD  gabapentin (NEURONTIN) 300 MG capsule Take 1 capsule (300 mg total) by mouth at bedtime. 10/18/14  Yes Meredeth Ide, MD  glimepiride (AMARYL) 2 MG tablet Take 2 mg by mouth daily with breakfast.   Yes Historical Provider, MD  glipiZIDE (GLUCOTROL XL) 10 MG 24 hr tablet Take 10 mg by mouth daily.  Yes Historical Provider, MD  insulin glargine (LANTUS) 100 UNIT/ML injection Inject 55 Units into the skin at bedtime.    Yes Historical Provider, MD  insulin regular (NOVOLIN R,HUMULIN R) 100 units/mL injection Inject 12-15 Units into the skin 3 (three) times daily before meals. 12 units before breakfast, 15 units before lunch and dinner   Yes Historical Provider, MD  levothyroxine (SYNTHROID, LEVOTHROID) 125 MCG tablet Take 125 mcg by mouth daily.     Yes Historical Provider, MD  LORazepam (ATIVAN) 1 MG tablet Take 1 tablet (1 mg total) by mouth 2  (two) times daily. 10/18/14  Yes Meredeth IdeGagan S Lama, MD  losartan (COZAAR) 100 MG tablet Take 100 mg by mouth daily.     Yes Historical Provider, MD  metoprolol succinate (TOPROL-XL) 100 MG 24 hr tablet Take 1 tablet (100 mg total) by mouth daily. 10/18/14  Yes Meredeth IdeGagan S Lama, MD  Multiple Vitamins-Minerals (MULTIVITAMIN WITH MINERALS) tablet Take 1 tablet by mouth daily.   Yes Historical Provider, MD  pantoprazole (PROTONIX) 40 MG tablet Take 40 mg by mouth daily.   Yes Historical Provider, MD  PARoxetine (PAXIL) 20 MG tablet Take 20 mg by mouth every morning.     Yes Historical Provider, MD  potassium chloride SA (K-DUR,KLOR-CON) 20 MEQ tablet Take 20 mEq by mouth daily.     Yes Historical Provider, MD   BP 102/76 mmHg  Pulse 77  Temp(Src) 98.2 F (36.8 C) (Oral)  Resp 20  Ht 5' (1.524 m)  Wt 212 lb 8 oz (96.389 kg)  BMI 41.50 kg/m2  SpO2 93% Physical Exam  Constitutional: She is oriented to person, place, and time. She appears well-developed and well-nourished.  HENT:  Head: Normocephalic and atraumatic.  Eyes: Conjunctivae are normal. Right eye exhibits no discharge. Left eye exhibits no discharge.  Neck: Normal range of motion. Neck supple. No tracheal deviation present.  Cardiovascular: Normal rate and regular rhythm.   Pulmonary/Chest: Effort normal and breath sounds normal.  Abdominal: Soft. She exhibits no distension. There is no tenderness. There is no guarding.  Musculoskeletal: She exhibits no edema.  Neurological: She is alert and oriented to person, place, and time. A cranial nerve deficit is present. GCS eye subscore is 4. GCS verbal subscore is 5. GCS motor subscore is 6.  Patient has left arm weakness greater than left leg weakness, normal strength right arm and leg, sensation grossly equal bilateral, visual fields intact, pupils equal, difficult to finger nose and the left. See neurology stroke scale for further details.  Skin: Skin is warm. No rash noted.  Psychiatric: She has  a normal mood and affect.  Nursing note and vitals reviewed.   ED Course  Procedures (including critical care time) Labs Review Labs Reviewed  COMPREHENSIVE METABOLIC PANEL - Abnormal; Notable for the following:    Glucose, Bld 184 (*)    Albumin 3.3 (*)    GFR calc non Af Amer 55 (*)    GFR calc Af Amer 64 (*)    All other components within normal limits  URINE RAPID DRUG SCREEN (HOSP PERFORMED) - Abnormal; Notable for the following:    Benzodiazepines POSITIVE (*)    All other components within normal limits  URINALYSIS, ROUTINE W REFLEX MICROSCOPIC - Abnormal; Notable for the following:    APPearance CLOUDY (*)    Nitrite POSITIVE (*)    Leukocytes, UA MODERATE (*)    All other components within normal limits  URINE MICROSCOPIC-ADD ON - Abnormal; Notable for the  following:    Squamous Epithelial / LPF FEW (*)    Bacteria, UA MANY (*)    All other components within normal limits  I-STAT CHEM 8, ED - Abnormal; Notable for the following:    Glucose, Bld 187 (*)    All other components within normal limits  URINE CULTURE  ETHANOL  PROTIME-INR  APTT  CBC  DIFFERENTIAL  PROTIME-INR  TSH  HEMOGLOBIN A1C  I-STAT TROPOININ, ED  I-STAT TROPOININ, ED    Imaging Review Ct Head Wo Contrast  11/17/2014   CLINICAL DATA:  Code stroke. Left-sided weakness and left facial droop.  EXAM: CT HEAD WITHOUT CONTRAST  TECHNIQUE: Contiguous axial images were obtained from the base of the skull through the vertex without intravenous contrast.  COMPARISON:  MRI 10/13/2014, CT 10/13/2014  FINDINGS: Ventricle size is normal.  Cerebral volume normal for age  Multiple areas of chronic ischemia. Chronic infarct left anterior basal ganglia is unchanged. Chronic ischemia throughout the cerebral white matter bilaterally also unchanged.  Negative for acute ischemic infarction. Negative for hemorrhage or mass.  Calvarium intact.  IMPRESSION: Chronic ischemic changes are stable.  No acute abnormality.   Critical Value/emergent results were called by telephone at the time of interpretation on 11/17/2014 at 9:39 am to Dr. Thad Ranger , who verbally acknowledged these results.   Electronically Signed   By: Marlan Palau M.D.   On: 11/17/2014 09:39   Mr Brain Wo Contrast  11/17/2014   CLINICAL DATA:  64 year old female with recent thromboembolic stroke in February with left side weakness. New onset visual changes, double vision. Initial encounter.  EXAM: MRI HEAD WITHOUT CONTRAST  TECHNIQUE: Multiplanar, multiecho pulse sequences of the brain and surrounding structures were obtained without intravenous contrast.  COMPARISON:  Head CT without contrast 0919 hours today. Brain MRI 10/13/2014.  FINDINGS: New patchy and confluent restricted diffusion in the right corona radiata near the superior aspect of the basal ganglia. Trace involvement of the right putamen. New small nearby posterior right temporal or parietal lobe white matter foci of mild restriction (series 3, image 27).  There is a tiny acute to subacute focus of restricted diffusion in the left parietal lobe on series 3, image 33. There is a small focus of left occipital lobe white matter restricted diffusion on image 23.  No posterior fossa restricted diffusion. Major intracranial vascular flow voids are stable.  Mild T2 and FLAIR hyperintensity associated with the acute findings today, no associated acute hemorrhage or mass effect. Widespread chronic lacunar infarcts in the left deep gray matter nuclei including with residual hemosiderin. Superimposed advanced and confluent cerebral white matter T2 and FLAIR hyperintensity. Scattered chronic micro hemorrhages in the brain. Confluent abnormal T2 hyperintensity in the pons is stable. No definite cerebral cortical encephalomalacia. The cerebellum is within normal limits.  Stable cerebral volume. No midline shift, mass effect, evidence of mass lesion, ventriculomegaly, extra-axial collection or acute intracranial  hemorrhage. Cervicomedullary junction and pituitary are within normal limits. Negative visualized cervical spine. Normal bone marrow signal.  Visualized scalp soft tissues are within normal limits. Stable orbits soft tissues. Visualized paranasal sinuses and mastoids are clear.  IMPRESSION: 1. Recurrent acute small bilateral cerebral white matter infarcts, maximal today in the right corona radiata. No acute hemorrhage or mass effect. Synchronous small vessel ischemia versus recent embolic event are the top differential considerations. 2. Underlying severe chronic small vessel disease.   Electronically Signed   By: Odessa Fleming M.D.   On: 11/17/2014 13:38  EKG Interpretation None     EKG reviewed sinus rhythm heart rate 71, incomplete right bundle-branch block, prolonged QT, T-wave inversions. MDM   Final diagnoses:  Stroke  Left-sided weakness     PatiPatient with known recent stroke presents with worsening stroke symptoms on the. Patient clarifies that symptoms started last night. Patient is out of any window for TPA and with recent stroke on Plavix and aspirin. Neurology at bedside with myself for assessment. CT scan results reviewed no acute bleeding. Likely plan for medicine observation.   The patients results and plan were reviewed and discussed.   Any x-rays performed were personally reviewed by myself.   Differential diagnosis were considered with the presenting HPI.  Medications  PARoxetine (PAXIL) tablet 20 mg (not administered)  potassium chloride SA (K-DUR,KLOR-CON) CR tablet 20 mEq (20 mEq Oral Given 11/17/14 1407)  losartan (COZAAR) tablet 100 mg (100 mg Oral Given 11/17/14 1407)  furosemide (LASIX) tablet 60 mg (60 mg Oral Given 11/17/14 1407)  levothyroxine (SYNTHROID, LEVOTHROID) tablet 125 mcg (125 mcg Oral Not Given 11/17/14 1100)  cyanocobalamin ((VITAMIN B-12)) injection 1,000 mcg (not administered)  aspirin EC tablet 81 mg (81 mg Oral Given 11/17/14 1407)  insulin  glargine (LANTUS) injection 55 Units (not administered)  atorvastatin (LIPITOR) tablet 20 mg (not administered)  clopidogrel (PLAVIX) tablet 75 mg (75 mg Oral Given 11/17/14 1406)  gabapentin (NEURONTIN) capsule 300 mg (not administered)  LORazepam (ATIVAN) tablet 1 mg (1 mg Oral Given 11/17/14 1406)  pantoprazole (PROTONIX) EC tablet 40 mg (40 mg Oral Given 11/17/14 1406)  heparin injection 5,000 Units (5,000 Units Subcutaneous Given 11/17/14 1543)  sodium chloride 0.9 % injection 3 mL (0 mLs Intravenous Duplicate 11/17/14 1100)  acetaminophen (TYLENOL) tablet 650 mg (not administered)    Or  acetaminophen (TYLENOL) suppository 650 mg (not administered)  HYDROcodone-acetaminophen (NORCO/VICODIN) 5-325 MG per tablet 1-2 tablet (not administered)  morphine 2 MG/ML injection 1 mg (not administered)  cefTRIAXone (ROCEPHIN) 1 g in dextrose 5 % 50 mL IVPB - Premix (1 g Intravenous Given 11/17/14 1404)    Filed Vitals:   11/17/14 1018 11/17/14 1100 11/17/14 1358 11/17/14 1454  BP:  166/69 152/79 102/76  Pulse:   86 77  Temp: 98.8 F (37.1 C) 98.4 F (36.9 C) 99 F (37.2 C) 98.2 F (36.8 C)  TempSrc:  Oral Oral Oral  Resp:  20 20 20   Height:      Weight:      SpO2:  94% 100% 93%    Final diagnoses:  Stroke  Left-sided weakness    Admission/ observation were discussed with the admitting physician, patient and/or family and they are comfortable with the plan.    Blane Ohara, MD 11/17/14 [phone]

## 2014-11-18 DIAGNOSIS — I6931 Cognitive deficits following cerebral infarction: Secondary | ICD-10-CM | POA: Diagnosis not present

## 2014-11-18 DIAGNOSIS — K7689 Other specified diseases of liver: Secondary | ICD-10-CM | POA: Diagnosis not present

## 2014-11-18 DIAGNOSIS — Z794 Long term (current) use of insulin: Secondary | ICD-10-CM | POA: Diagnosis not present

## 2014-11-18 DIAGNOSIS — I6523 Occlusion and stenosis of bilateral carotid arteries: Secondary | ICD-10-CM | POA: Diagnosis not present

## 2014-11-18 DIAGNOSIS — M797 Fibromyalgia: Secondary | ICD-10-CM | POA: Diagnosis not present

## 2014-11-18 DIAGNOSIS — K219 Gastro-esophageal reflux disease without esophagitis: Secondary | ICD-10-CM | POA: Diagnosis not present

## 2014-11-18 DIAGNOSIS — Z79899 Other long term (current) drug therapy: Secondary | ICD-10-CM | POA: Diagnosis not present

## 2014-11-18 DIAGNOSIS — I6340 Cerebral infarction due to embolism of unspecified cerebral artery: Secondary | ICD-10-CM | POA: Diagnosis not present

## 2014-11-18 DIAGNOSIS — I639 Cerebral infarction, unspecified: Secondary | ICD-10-CM | POA: Diagnosis not present

## 2014-11-18 DIAGNOSIS — Z8249 Family history of ischemic heart disease and other diseases of the circulatory system: Secondary | ICD-10-CM | POA: Diagnosis not present

## 2014-11-18 DIAGNOSIS — E785 Hyperlipidemia, unspecified: Secondary | ICD-10-CM

## 2014-11-18 DIAGNOSIS — I6789 Other cerebrovascular disease: Secondary | ICD-10-CM | POA: Diagnosis not present

## 2014-11-18 DIAGNOSIS — R29810 Facial weakness: Secondary | ICD-10-CM | POA: Diagnosis present

## 2014-11-18 DIAGNOSIS — N3000 Acute cystitis without hematuria: Secondary | ICD-10-CM | POA: Diagnosis not present

## 2014-11-18 DIAGNOSIS — Z6841 Body Mass Index (BMI) 40.0 and over, adult: Secondary | ICD-10-CM | POA: Diagnosis not present

## 2014-11-18 DIAGNOSIS — E669 Obesity, unspecified: Secondary | ICD-10-CM | POA: Diagnosis not present

## 2014-11-18 DIAGNOSIS — M6289 Other specified disorders of muscle: Secondary | ICD-10-CM | POA: Diagnosis not present

## 2014-11-18 DIAGNOSIS — Z8673 Personal history of transient ischemic attack (TIA), and cerebral infarction without residual deficits: Secondary | ICD-10-CM | POA: Diagnosis not present

## 2014-11-18 DIAGNOSIS — E08329 Diabetes mellitus due to underlying condition with mild nonproliferative diabetic retinopathy without macular edema: Secondary | ICD-10-CM | POA: Diagnosis not present

## 2014-11-18 DIAGNOSIS — Z7982 Long term (current) use of aspirin: Secondary | ICD-10-CM | POA: Diagnosis not present

## 2014-11-18 DIAGNOSIS — E1159 Type 2 diabetes mellitus with other circulatory complications: Secondary | ICD-10-CM | POA: Diagnosis not present

## 2014-11-18 DIAGNOSIS — G8194 Hemiplegia, unspecified affecting left nondominant side: Secondary | ICD-10-CM | POA: Diagnosis present

## 2014-11-18 DIAGNOSIS — E1165 Type 2 diabetes mellitus with hyperglycemia: Secondary | ICD-10-CM | POA: Diagnosis not present

## 2014-11-18 DIAGNOSIS — E860 Dehydration: Secondary | ICD-10-CM | POA: Diagnosis not present

## 2014-11-18 DIAGNOSIS — M6281 Muscle weakness (generalized): Secondary | ICD-10-CM | POA: Diagnosis not present

## 2014-11-18 DIAGNOSIS — I700 Atherosclerosis of aorta: Secondary | ICD-10-CM | POA: Diagnosis not present

## 2014-11-18 DIAGNOSIS — R262 Difficulty in walking, not elsewhere classified: Secondary | ICD-10-CM | POA: Diagnosis not present

## 2014-11-18 DIAGNOSIS — I10 Essential (primary) hypertension: Secondary | ICD-10-CM

## 2014-11-18 DIAGNOSIS — R4189 Other symptoms and signs involving cognitive functions and awareness: Secondary | ICD-10-CM | POA: Diagnosis not present

## 2014-11-18 DIAGNOSIS — F419 Anxiety disorder, unspecified: Secondary | ICD-10-CM | POA: Diagnosis not present

## 2014-11-18 DIAGNOSIS — R000 Tachycardia, unspecified: Secondary | ICD-10-CM | POA: Diagnosis not present

## 2014-11-18 DIAGNOSIS — F418 Other specified anxiety disorders: Secondary | ICD-10-CM | POA: Diagnosis present

## 2014-11-18 DIAGNOSIS — R278 Other lack of coordination: Secondary | ICD-10-CM | POA: Diagnosis not present

## 2014-11-18 DIAGNOSIS — I6503 Occlusion and stenosis of bilateral vertebral arteries: Secondary | ICD-10-CM | POA: Diagnosis not present

## 2014-11-18 DIAGNOSIS — E119 Type 2 diabetes mellitus without complications: Secondary | ICD-10-CM | POA: Diagnosis not present

## 2014-11-18 DIAGNOSIS — R41841 Cognitive communication deficit: Secondary | ICD-10-CM | POA: Diagnosis not present

## 2014-11-18 DIAGNOSIS — Z91041 Radiographic dye allergy status: Secondary | ICD-10-CM | POA: Diagnosis not present

## 2014-11-18 DIAGNOSIS — F339 Major depressive disorder, recurrent, unspecified: Secondary | ICD-10-CM | POA: Diagnosis not present

## 2014-11-18 DIAGNOSIS — Z7902 Long term (current) use of antithrombotics/antiplatelets: Secondary | ICD-10-CM | POA: Diagnosis not present

## 2014-11-18 LAB — GLUCOSE, CAPILLARY
GLUCOSE-CAPILLARY: 148 mg/dL — AB (ref 70–99)
GLUCOSE-CAPILLARY: 207 mg/dL — AB (ref 70–99)
Glucose-Capillary: 155 mg/dL — ABNORMAL HIGH (ref 70–99)
Glucose-Capillary: 146 mg/dL — ABNORMAL HIGH (ref 70–99)

## 2014-11-18 LAB — BASIC METABOLIC PANEL
Anion gap: 12 (ref 5–15)
BUN: 13 mg/dL (ref 6–23)
CALCIUM: 9.5 mg/dL (ref 8.4–10.5)
CO2: 25 mmol/L (ref 19–32)
Chloride: 104 mmol/L (ref 96–112)
Creatinine, Ser: 1.04 mg/dL (ref 0.50–1.10)
GFR calc Af Amer: 64 mL/min — ABNORMAL LOW (ref >90)
GFR calc non Af Amer: 56 mL/min — ABNORMAL LOW (ref >90)
Glucose, Bld: 163 mg/dL — ABNORMAL HIGH (ref 70–99)
Potassium: 3.7 mmol/L (ref 3.5–5.1)
Sodium: 141 mmol/L (ref 135–145)

## 2014-11-18 LAB — CBC
HEMATOCRIT: 42.3 % (ref 36.0–46.0)
Hemoglobin: 14 g/dL (ref 12.0–15.0)
MCH: 31.2 pg (ref 26.0–34.0)
MCHC: 33.1 g/dL (ref 30.0–36.0)
MCV: 94.2 fL (ref 78.0–100.0)
Platelets: 195 10*3/uL (ref 150–400)
RBC: 4.49 MIL/uL (ref 3.87–5.11)
RDW: 14 % (ref 11.5–15.5)
WBC: 9.7 10*3/uL (ref 4.0–10.5)

## 2014-11-18 LAB — HEMOGLOBIN A1C
Hgb A1c MFr Bld: 8.4 % — ABNORMAL HIGH (ref 4.8–5.6)
Mean Plasma Glucose: 194 mg/dL

## 2014-11-18 LAB — SEDIMENTATION RATE: Sed Rate: 40 mm/hr — ABNORMAL HIGH (ref 0–22)

## 2014-11-18 MED ORDER — DIPHENHYDRAMINE HCL 25 MG PO CAPS
50.0000 mg | ORAL_CAPSULE | Freq: Once | ORAL | Status: AC
  Administered 2014-11-19: 50 mg via ORAL
  Filled 2014-11-18: qty 2

## 2014-11-18 MED ORDER — PREDNISONE 50 MG PO TABS
50.0000 mg | ORAL_TABLET | Freq: Four times a day (QID) | ORAL | Status: AC
  Administered 2014-11-18 – 2014-11-19 (×3): 50 mg via ORAL
  Filled 2014-11-18 (×3): qty 1

## 2014-11-18 NOTE — Evaluation (Addendum)
Physical Therapy Evaluation Patient Details Name: Dawn Ho MRN: [id number] DOB: [birth year] Today's Date: 11/18/2014   History of Present Illness  Patient admitted from [facility] with left side weakness and blurred vision.  MRI showing multiple areas of infarct bilateral white matter, most prominent in corona radiata.  Clinical Impression  Patient presents with decreased independence with mobility due to deficits listed in PT problem list.  She will benefit from skilled PT in the acute setting to allow decreased burden of care at next venue.  She continues to need skilled rehab care, recommend return to SNF for rehab.    Follow Up Recommendations Supervision/Assistance - 24 hour;SNF    Equipment Recommendations  None recommended by PT    Recommendations for Other Services       Precautions / Restrictions Precautions Precautions: Fall Precaution Comments: daughter reports multiple falls at nursing center due to pt forgets she needs help and she tries to walk to bathroom      Mobility  Bed Mobility Overal bed mobility: Needs Assistance Bed Mobility: Supine to Sit     Supine to sit: Mod assist     General bed mobility comments: to lift trunk and assist to scoot to edge of bed  Transfers Overall transfer level: Needs assistance Equipment used: Rolling walker (2 wheeled) Transfers: Sit to/from [employer] Sit to Stand: Min assist;From elevated surface Stand pivot transfers: Mod assist       General transfer comment: increased time to step to pivot to chair with walker due to feet sticking to floor with slipper socks and need for facilitation for weight shift to allow for clearance to move left foot.  Ambulation/Gait                Stairs            Wheelchair Mobility    Modified Rankin (Stroke Patients Only) Modified Rankin (Stroke Patients Only) Pre-Morbid Rankin Score: Moderately severe disability Modified Rankin: Moderately  severe disability     Balance Overall balance assessment: Needs assistance   Sitting balance-Leahy Scale: Fair Sitting balance - Comments: initially needed assist for balance due to bed uneven, then sat with supervision about 5 minutes tilting head back to take meds without LOB   Standing balance support: Bilateral upper extremity supported Standing balance-Leahy Scale: Poor Standing balance comment: feels unsteady standing with hands on walker                             Pertinent Vitals/Pain Pain Assessment: Faces Faces Pain Scale: Hurts even more Pain Location: left shoulder Pain Descriptors / Indicators: Aching Pain Intervention(s): Monitored during session;Patient requesting pain meds-RN notified    Home Living Family/patient expects to be discharged to:: Skilled nursing facility Living Arrangements: Spouse/significant other Available Help at Discharge: Family;Available PRN/intermittently Type of Home: House Home Access: Stairs to enter Entrance Stairs-Rails: Right;Left;Can reach both Entrance Stairs-Number of Steps: 4 Home Layout: One level Home Equipment: Walker - 2 wheels;Cane - single point      Prior Function Level of Independence: Needs assistance   Gait / Transfers Assistance Needed: daughter reports would walk some with walker with assist at nursing center, but mostly in wheelchair           Hand Dominance   Dominant Hand: Right    Extremity/Trunk Assessment   Upper Extremity Assessment: LUE deficits/detail       LUE Deficits / Details: shoulder flexion 2-/5, elbow  flexion 2-/5, weak grip; sensation WNL per pt, mild increased tone   Lower Extremity Assessment: LLE deficits/detail   LLE Deficits / Details: hip flexion 4-/5, knee extension 4/5, ankle DF 4/5     Communication   Communication: No difficulties  Cognition Arousal/Alertness: Awake/alert Behavior During Therapy: Flat affect Overall Cognitive Status: History of cognitive  impairments - at baseline Area of Impairment: Problem solving;Memory;Safety/judgement     Memory: Decreased short-term memory   Safety/Judgement: Decreased awareness of deficits;Decreased awareness of safety   Problem Solving: Slow processing;Requires verbal cues;Requires tactile cues General Comments: Pt requires cues to initiate self care tasks at times (inconsistent). She is slow to process information.  Requires mod cues for walker safety.   mod A for problem solving     General Comments General comments (skin integrity, edema, etc.): daughter present    Exercises General Exercises - Upper Extremity Shoulder Flexion: PROM      Assessment/Plan    PT Assessment Patient needs continued PT services  PT Diagnosis Abnormality of gait;Generalized weakness   PT Problem List Decreased strength;Decreased mobility;Decreased safety awareness;Decreased activity tolerance;Decreased balance;Pain;Decreased range of motion  PT Treatment Interventions DME instruction;Therapeutic exercise;Gait training;Balance training   PT Goals (Current goals can be found in the Care Plan section) Acute Rehab PT Goals Patient Stated Goal: To get out of bed PT Goal Formulation: With patient/family Time For Goal Achievement: 12/09/14 Potential to Achieve Goals: Fair    Frequency     Barriers to discharge        Co-evaluation               End of Session Equipment Utilized During Treatment: Gait belt Activity Tolerance: Patient limited by fatigue Patient left: in chair;with chair alarm set;with call bell/phone within reach;with family/visitor present Nurse Communication: Mobility status    Functional Assessment Tool Used: Clinical Judgement Functional Limitation: Mobility: Walking and moving around Mobility: Walking and Moving Around Current Status [phone]): At least 60 percent but less than 80 percent impaired, limited or restricted Mobility: Walking and Moving Around Goal Status [phone]): At  least 40 percent but less than 60 percent impaired, limited or restricted    Time: 8295-6213 PT Time Calculation (min) (ACUTE ONLY): 28 min   Charges:   PT Evaluation $Initial PT Evaluation Tier I: 1 Procedure PT Treatments $Therapeutic Activity: 8-22 mins   PT G Codes:   PT G-Codes **NOT FOR INPATIENT CLASS** Functional Assessment Tool Used: Clinical Judgement Functional Limitation: Mobility: Walking and moving around Mobility: Walking and Moving Around Current Status ([id number]): At least 60 percent but less than 80 percent impaired, limited or restricted Mobility: Walking and Moving Around Goal Status [phone]): At least 40 percent but less than 60 percent impaired, limited or restricted    [facility] 11/18/2014, 11:34 AM  Sheran Lawless, PT [phone] 11/18/2014

## 2014-11-18 NOTE — Progress Notes (Signed)
[facility] PROGRESS NOTE  Dawn Ho HYQ:[id number] DOB: [birth year] DOA: 11/17/2014 PCP: Frederica Kuster, MD Interim summary. 64 y.o lady admitted for numbness of the left side and blurred vision and left sided weakness. She was recently discharged in feb for stroke. MRI of the brain revealed new multiple areas of acute and sub acute infarcts in the right frontal lobe. Neurology consulted and recommendations given.  Assessment/Plan: 1. Recurrent stroke: Admitted to telemetry and neurology consulted. Recent echo and TEE done in 2/26. She underwent loop recorder placement on 2/27 interrogated this admission and no cardiac arrythmias found. Plan for CT angio of then neck and further evaluation malignancy work up. Hypercoagulable work up ordered . Resume aspirin and plavix.  Therapy evals pending. LDL is 93 a month ago. Continue statin.  Plan for SNF on discharge.    Hypertension: Resume home meds.    Diabetes Mellitus: hgba1c is 8.4. CBG (last 3)   Recent Labs  11/18/14 0647 11/18/14 1203 11/18/14 1717  GLUCAP 148* 155* 146*    Resume SSI and lantus.    Morbid obesity.   UTI: Cultures sent and pending.  On rocephin.   Code Status: full code.  Family Communication: discussed with daughter at bedside Disposition Plan: pending.    Consultants:  Neurology.   Procedures:  MRI brain   CT head  Antibiotics:  [place].   HPI/Subjective: Comfortable, confused.   Objective: Filed Vitals:   11/18/14 1355  BP: 129/67  Pulse: 108  Temp: 98.6 F (37 C)  Resp: 18   No intake or output data in the 24 hours ending 11/18/14 1742 Filed Weights   11/17/14 0934  Weight: 96.389 kg (212 lb 8 oz)    Exam:   General:  Alert afebrile comfortable  Cardiovascular: s1s2  Respiratory: ctab, no wheezing or rhonchi.   Abdomen: soft non tender non distended bowel sounds heard  Musculoskeletal: no pedal edema.   Data Reviewed: Basic Metabolic  Panel:  Recent Labs Lab 11/17/14 0928 11/17/14 0935 11/18/14 0629  NA 142 142 141  K 3.7 3.9 3.7  CL 107 105 104  CO2 28  --  25  GLUCOSE 184* 187* 163*  BUN 12 16 13   CREATININE 1.05 0.90 1.04  CALCIUM 9.4  --  9.5   Liver Function Tests:  Recent Labs Lab 11/17/14 0928  AST 24  ALT 19  ALKPHOS 81  BILITOT 0.6  PROT 6.9  ALBUMIN 3.3*   No results for input(s): LIPASE, AMYLASE in the last 168 hours. No results for input(s): AMMONIA in the last 168 hours. CBC:  Recent Labs Lab 11/17/14 0928 11/17/14 0935 11/18/14 0629  WBC 9.9  --  9.7  NEUTROABS 6.8  --   --   HGB 13.4 14.6 14.0  HCT 40.4 43.0 42.3  MCV 94.0  --  94.2  PLT 203  --  195   Cardiac Enzymes: No results for input(s): CKTOTAL, CKMB, CKMBINDEX, TROPONINI in the last 168 hours. BNP (last 3 results) No results for input(s): BNP in the last 8760 hours.  ProBNP (last 3 results) No results for input(s): PROBNP in the last 8760 hours.  CBG:  Recent Labs Lab 11/17/14 2203 11/18/14 0647 11/18/14 1203 11/18/14 1717  GLUCAP 209* 148* 155* 146*    Recent Results (from the past 240 hour(s))  Urine culture     Status: None (Preliminary result)   Collection Time: 11/17/14 12:19 PM  Result Value Ref Range Status   Specimen Description URINE, CATHETERIZED  Final   Special Requests NONE  Final   Colony Count   Final    >=100,000 COLONIES/ML Performed at [facility]    Culture   Final    ESCHERICHIA COLI Performed at [facility]    Report Status PENDING  Incomplete     Studies: Ct Head Wo Contrast  11/17/2014   CLINICAL DATA:  Code stroke. Left-sided weakness and left facial droop.  EXAM: CT HEAD WITHOUT CONTRAST  TECHNIQUE: Contiguous axial images were obtained from the base of the skull through the vertex without intravenous contrast.  COMPARISON:  MRI 10/13/2014, CT 10/13/2014  FINDINGS: Ventricle size is normal.  Cerebral volume normal for age  Multiple areas of chronic  ischemia. Chronic infarct left anterior basal ganglia is unchanged. Chronic ischemia throughout the cerebral white matter bilaterally also unchanged.  Negative for acute ischemic infarction. Negative for hemorrhage or mass.  Calvarium intact.  IMPRESSION: Chronic ischemic changes are stable.  No acute abnormality.  Critical Value/emergent results were called by telephone at the time of interpretation on 11/17/2014 at 9:39 am to Dr. Thad Ranger , who verbally acknowledged these results.   Electronically Signed   By: Marlan Palau M.D.   On: 11/17/2014 09:39   Mr Brain Wo Contrast  11/17/2014   CLINICAL DATA:  64 year old female with recent thromboembolic stroke in February with left side weakness. New onset visual changes, double vision. Initial encounter.  EXAM: MRI HEAD WITHOUT CONTRAST  TECHNIQUE: Multiplanar, multiecho pulse sequences of the brain and surrounding structures were obtained without intravenous contrast.  COMPARISON:  Head CT without contrast 0919 hours today. Brain MRI 10/13/2014.  FINDINGS: New patchy and confluent restricted diffusion in the right corona radiata near the superior aspect of the basal ganglia. Trace involvement of the right putamen. New small nearby posterior right temporal or parietal lobe white matter foci of mild restriction (series 3, image 27).  There is a tiny acute to subacute focus of restricted diffusion in the left parietal lobe on series 3, image 33. There is a small focus of left occipital lobe white matter restricted diffusion on image 23.  No posterior fossa restricted diffusion. Major intracranial vascular flow voids are stable.  Mild T2 and FLAIR hyperintensity associated with the acute findings today, no associated acute hemorrhage or mass effect. Widespread chronic lacunar infarcts in the left deep gray matter nuclei including with residual hemosiderin. Superimposed advanced and confluent cerebral white matter T2 and FLAIR hyperintensity. Scattered chronic micro  hemorrhages in the brain. Confluent abnormal T2 hyperintensity in the pons is stable. No definite cerebral cortical encephalomalacia. The cerebellum is within normal limits.  Stable cerebral volume. No midline shift, mass effect, evidence of mass lesion, ventriculomegaly, extra-axial collection or acute intracranial hemorrhage. Cervicomedullary junction and pituitary are within normal limits. Negative visualized cervical spine. Normal bone marrow signal.  Visualized scalp soft tissues are within normal limits. Stable orbits soft tissues. Visualized paranasal sinuses and mastoids are clear.  IMPRESSION: 1. Recurrent acute small bilateral cerebral white matter infarcts, maximal today in the right corona radiata. No acute hemorrhage or mass effect. Synchronous small vessel ischemia versus recent embolic event are the top differential considerations. 2. Underlying severe chronic small vessel disease.   Electronically Signed   By: Odessa Fleming M.D.   On: 11/17/2014 13:38    Scheduled Meds: . aspirin EC  81 mg Oral Daily  . atorvastatin  20 mg Oral q1800  . cefTRIAXone (ROCEPHIN)  IV  1 g Intravenous Q24H  .  clopidogrel  75 mg Oral Daily  . [START ON 11/19/2014] cyanocobalamin  1,000 mcg Intramuscular Q30 days  . [START ON 11/19/2014] diphenhydrAMINE  50 mg Oral Once  . furosemide  60 mg Oral Daily  . gabapentin  300 mg Oral QHS  . heparin  5,000 Units Subcutaneous 3 times per day  . insulin aspart  0-9 Units Subcutaneous TID WC  . insulin aspart  10 Units Subcutaneous TID WC  . insulin glargine  55 Units Subcutaneous QHS  . levothyroxine  125 mcg Oral Daily  . LORazepam  1 mg Oral BID  . losartan  100 mg Oral Daily  . pantoprazole  40 mg Oral Daily  . PARoxetine  20 mg Oral Daily  . potassium chloride SA  20 mEq Oral Daily  . predniSONE  50 mg Oral Q6H  . sodium chloride  3 mL Intravenous Q12H   Continuous Infusions:   Principal Problem:   Left-sided weakness Active Problems:   Fibromyalgia    Diabetes mellitus   Obesity   Stroke    Time spent: 25 minutes    Dawn Ho  [facility] Pager [phone] If 7PM-7AM, please contact night-coverage at [email or web address], password [facility] 11/18/2014, 5:42 PM  LOS: 0 days

## 2014-11-18 NOTE — Progress Notes (Signed)
UR completed 

## 2014-11-18 NOTE — Evaluation (Signed)
Clinical/Bedside Swallow Evaluation Patient Details  Name: Dawn Ho MRN: [id number] Date of Birth: 1950/11/04  Today's Date: 11/18/2014 Time: SLP Start Time (ACUTE ONLY): 0105 SLP Stop Time (ACUTE ONLY): 0130 SLP Time Calculation (min) (ACUTE ONLY): 25 min  Past Medical History:  Past Medical History  Diagnosis Date  . Fibromyalgia   . Diabetes mellitus   . Hypertension   . Dyslipidemia   . TIA (transient ischemic attack)   . Depression with anxiety   . CVA (cerebral infarction)    Past Surgical History:  Past Surgical History  Procedure Laterality Date  . Cholecystectomy    . Cesarean section    . Tee without cardioversion N/A 10/15/2014    Procedure: TRANSESOPHAGEAL ECHOCARDIOGRAM (TEE);  Surgeon: Pricilla Riffle, MD;  Location: [facility] ENDOSCOPY;  Service: Cardiovascular;  Laterality: N/A;  . Loop recorder implant      Dr. Johney Frame placed 10/15/2014   HPI:  64 y.o. 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 MRI revealed bilateral punctate acute/subacute infarcts, largest cluster in the posterior right frontal lobe. Old left greater than right basal ganglia and thalami and deep white matter infarcts   Assessment / Plan / Recommendation Clinical Impression   Pt with throat clearing x1 after multiple swallows via straw of thin liquids; prior straw sips and cup sips of thin without difficulty noted and subsequent swallows WFL; otherwise, oropharyngeal swallow appeared within functional limits with pain a contributing factor during BSE d/t rating of 5 in left shoulder which affected attention to tasks as well as documented cognitive deficits (10/14/14) with previous SLE.  Daughter stated she has not had any difficulty eating, but has not had a huge appetite since being hospitalized.    Aspiration Risk  Mild d/t cognitive deficits    Diet  Recommendation Regular;Thin liquid (as tolerated)   Liquid Administration via: Cup;Straw Medication Administration: Whole meds with liquid (as tolerated) Supervision: Staff to assist with self feeding Compensations: Slow rate;Small sips/bites Postural Changes and/or Swallow Maneuvers: Seated upright 90 degrees;Upright 30-60 min after meal    Other  Recommendations Oral Care Recommendations: Oral care BID   Follow Up Recommendations  Skilled Nursing facility    Frequency and Duration   n/a     Pertinent Vitals/Pain WDL    SLP Swallow Goals  n/a   Swallow Study Prior Functional Status  Type of Home: House Available Help at Discharge: Family;Available PRN/intermittently    General Date of Onset: 11/17/14 HPI: 64 y.o. 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 MRI revealed bilateral punctate acute/subacute infarcts, largest cluster in the posterior right frontal lobe. Old left greater than right basal ganglia and thalami and deep white matter infarcts Type of Study: Bedside swallow evaluation Previous Swallow Assessment: n/a Diet Prior to this Study: Thin liquids;Regular;Other (Comment) (carb modified) Temperature Spikes Noted: No Respiratory Status: Room air History of Recent Intubation: No Behavior/Cognition: Cooperative;Alert Oral Cavity - Dentition: Poor condition;Missing dentition Self-Feeding Abilities: Able to feed self;Needs assist Patient Positioning: Upright in chair Baseline Vocal Quality: Low vocal intensity;Clear Volitional Cough: Strong Volitional Swallow: Able to elicit    Oral/Motor/Sensory Function Overall Oral Motor/Sensory Function: Impaired Lingual Symmetry: Within Functional Limits Facial Symmetry: Left drooping eyelid;Left droop (mild) Velum: Within Functional Limits Mandible: Within Functional Limits    Ice Chips  Ice chips: Not tested   Thin Liquid Thin Liquid: Within functional limits Presentation: Cup;Straw    Nectar Thick Nectar Thick Liquid: Not tested   Honey Thick Honey Thick Liquid: Not tested   Puree Puree: Within functional limits Presentation: Self Fed   Solid       Solid: Within functional limits Presentation: Self Fed       ADAMS,PAT, M.S., CCC-SLP 11/18/2014,1:50 PM

## 2014-11-18 NOTE — Progress Notes (Signed)
Implantable loop recorder report is reviewed This reveals no arrhythmmias  She has had no afib recorded.  Electrophysiology team to see as needed while here. Please call with questions.   Hillis RangeJames Larraine Argo MD, [facility] 11/18/2014 10:34 AM

## 2014-11-18 NOTE — Progress Notes (Signed)
STROKE TEAM PROGRESS NOTE   HISTORY Dawn Ho is an 64 y.o. female who reports that last evening she noted numbness on her left and blurred vision when she awakened from her sleep at 2300 (LKW 11/16/2014 at 2100). She went back to sleep and when she awakened this morning the staff noted she was dragging her left leg. She also had a facial droop and had noted continued left sided numbness. EMS was called at that time and the patient was brought in as a code stroke. Patient was not administered TPA secondary to  outside treatment window, recent CVA. She was admitted for further evaluation and treatment.   SUBJECTIVE (INTERVAL HISTORY) Her daughter is at the bedside.  Overall she feels her condition is stable. Patient is sitting in chair eating breakfast. Still has LUE > LLE weakness. I long discussion with patient and daughter regarding current diagnosis, further workup and treatment options.   OBJECTIVE Temp:  [97.9 F (36.6 C)-99 F (37.2 C)] 98.4 F (36.9 C) (03/31 1044) Pulse Rate:  [72-92] 92 (03/31 1044) Cardiac Rhythm:  [-] Normal sinus rhythm (03/30 2015) Resp:  [18-20] 20 (03/31 1044) BP: (102-160)/(62-80) 156/75 mmHg (03/31 1044) SpO2:  [93 %-100 %] 96 % (03/31 1044)   Recent Labs Lab 11/17/14 2203 11/18/14 0647  GLUCAP 209* 148*    Recent Labs Lab 11/17/14 0928 11/17/14 0935 11/18/14 0629  NA 142 142 141  K 3.7 3.9 3.7  CL 107 105 104  CO2 28  --  25  GLUCOSE 184* 187* 163*  BUN 12 16 13   CREATININE 1.05 0.90 1.04  CALCIUM 9.4  --  9.5    Recent Labs Lab 11/17/14 0928  AST 24  ALT 19  ALKPHOS 81  BILITOT 0.6  PROT 6.9  ALBUMIN 3.3*    Recent Labs Lab 11/17/14 0928 11/17/14 0935 11/18/14 0629  WBC 9.9  --  9.7  NEUTROABS 6.8  --   --   HGB 13.4 14.6 14.0  HCT 40.4 43.0 42.3  MCV 94.0  --  94.2  PLT 203  --  195   No results for input(s): CKTOTAL, CKMB, CKMBINDEX, TROPONINI in the last 168 hours.  Recent Labs  11/17/14 0928  11/17/14 1356  LABPROT 13.8 13.7  INR 1.05 1.04    Recent Labs  11/17/14 1100  COLORURINE YELLOW  LABSPEC 1.018  PHURINE 6.0  GLUCOSEU NEGATIVE  HGBUR NEGATIVE  BILIRUBINUR NEGATIVE  KETONESUR NEGATIVE  PROTEINUR NEGATIVE  UROBILINOGEN 0.2  NITRITE POSITIVE*  LEUKOCYTESUR MODERATE*       Component Value Date/Time   CHOL 207* 10/14/2014 0419   TRIG 358* 10/14/2014 0419   HDL 42 10/14/2014 0419   CHOLHDL 4.9 10/14/2014 0419   VLDL 72* 10/14/2014 0419   LDLCALC 93 10/14/2014 0419   Lab Results  Component Value Date   HGBA1C 8.4* 11/17/2014      Component Value Date/Time   LABOPIA NONE DETECTED 11/17/2014 1100   COCAINSCRNUR NONE DETECTED 11/17/2014 1100   LABBENZ POSITIVE* 11/17/2014 1100   AMPHETMU NONE DETECTED 11/17/2014 1100   THCU NONE DETECTED 11/17/2014 1100   LABBARB NONE DETECTED 11/17/2014 1100     Recent Labs Lab 11/17/14 0928  ETH <5    Ct Head Wo Contrast 11/17/2014    Chronic ischemic changes are stable.  No acute abnormality.    10/13/2014  Progressed chronic small vessel ischemia since 2012. No acute cortically based infarct or acute intracranial hemorrhage identified.  Mr Brain Wo Contrast 11/17/2014  1. Recurrent acute small bilateral cerebral white matter infarcts, maximal today in the right corona radiata. No acute hemorrhage or mass effect. Synchronous small vessel ischemia versus recent embolic event are the top differential considerations. 2. Underlying severe chronic small vessel disease.    Mr [name] Head Wo Contrast 10/13/2014  1. Multiple punctate acute/subacute infarcts of varying ages compatible with a history of symptoms over the last 3 weeks. The largest cluster of infarcts is in the posterior right frontal lobe, compatible with left-sided numbness and weakness.  2. Other more remote infarcts are present within the left greater than right basal ganglia and thalami as well as within the deep white matter.  3. Extensive  small vessel white matter changes are present bilaterally.  4. Extensive branch vessel stenoses and small vessel disease in both the anterior and posterior circulation.  5. Prominent proximal stenoses in M2 branches bilaterally.  6. No significant stenosis of the medium or large intracranial vessels proximal to the MCA bifurcations.  2D echo 10/14/14 - Left ventricle: The cavity size was normal. Systolic function was normal. The estimated ejection fraction was in the range of 55% to 60%. Wall motion was normal; there were no regional wall motion abnormalities. Features are consistent with a pseudonormal left ventricular filling pattern, with concomitant abnormal relaxation and increased filling pressure (grade 2 diastolic dysfunction).  TEE 10/15/14 - LA, LAA without masses No PFO by color doppler or with injection of agitated saline MV is normal Mild to mod MR TV normal AV mildly thickened. Trace AI PV grossly normal LVEF normal Mild fixed plaque in thoracic aorta   CUS 10/14/14 - Bilateral: intimal wall thickening CCA. Mild mixed plaque origin ICA. 1-39% ICA stenosis. Vertebral artery flow is antegrade.   PHYSICAL EXAM  Temp:  [97.9 F (36.6 C)-98.6 F (37 C)] 98.6 F (37 C) (03/31 1355) Pulse Rate:  [73-108] 108 (03/31 1355) Resp:  [18-20] 18 (03/31 1355) BP: (124-160)/(62-80) 129/67 mmHg (03/31 1355) SpO2:  [94 %-97 %] 95 % (03/31 1355)  General - morbid obesity, well developed, lethargic, no acute distress.  Ophthalmologic - none not visualized due to small pupils.  Cardiovascular - Regular rate and rhythm.  Mental Status -  Level of arousal and orientation to time, place, and person were intact. Language including expression, naming, repetition, comprehension was assessed and found intact. Fund of Knowledge was assessed and was impaired, does not no current and previous presidents.  Cranial Nerves II - XII - II - Visual field intact OU. III, IV,  VI - Extraocular movements intact. V - Facial sensation intact bilaterally. VII - Facial movement intact bilaterally. VIII - Hearing & vestibular intact bilaterally. X - Palate elevates symmetrically. XI - Chin turning & shoulder shrug intact bilaterally. XII - Tongue protrusion intact.  Motor Strength - The patient's strength was 3/5 LUE and 4+/5 LLE, and 5-/5 RUE and RLE.  Bulk was normal and fasciculations were absent.   Motor Tone - Muscle tone was assessed at the neck and appendages and was normal.  Reflexes - The patient's reflexes were 1+ in all extremities and she had no pathological reflexes.  Sensory - Light touch, temperature/pinprick were assessed and were symmetrical.    Coordination - The patient had normal movements in the right hand with no ataxia or dysmetria.  Tremor was absent.  Gait and Station - not tested due to safety concerns and weakness and lethargy.   ASSESSMENT/PLAN Dawn Ho is a 64 y.o. female with long history  of stroke with residual L weakness, obesity, fibromyalgia, hypertension and dyslipidemia in setting of recent bilateral ischemic infarcts (largest in R frontal lobe) presenting with blurry vision and worsening L hemiparesis. Patient was currently in SNF for rehab. . She did not receive IV t-PA due to recent stroke.   Recurrent embolic stroke:  New multiple scattered patchy bilateral infarcts, largest in the R corona radiata in setting of bilateral infarcts one month ago, all infarcts felt to be embolic secondary to unknown source  Resultant  L hemiparesis  MRI  scattered bilateral infarcts, largest in R corona radiata  Loop placed 10/16/2014, interrogated this admission. No atrial fibrillation found  Past workup:  MRA Extensive anterior and posterior branch vessel stenoses and small vessel disease. proximal B M2 stenoses  Carotid Doppler -1-39% ICA stenosis. Vertebral artery flow is antegrade.  2D Echo - EF 55-60%. No cardiac source of  emboli identified.  TEE - no PFO or evidence of thrombus  Will check CT angio of neck to look closer at neck and aorta vasculature and  CT of chest/abd/pelvis with and without contrast to look for malignancy - pt with documented allergy to IVP dye - investigating dye specific allergy. -> swelling with first contrast xray in 1969. Second contrast xray in the 90s without symptoms. Will order contrast prep and plan CTs in am.   Hypercoagulable and autoimmune workup  Heparin 5000 units sq tid for VTE prophylaxis Diet Carb Modified Fluid consistency:: Thin; Room service appropriate?: Yes  aspirin 81 mg orally every day and clopidogrel 75 mg orally every day prior to admission, now on aspirin 81 mg orally every day and clopidogrel 75 mg orally every day. May consider anticoagulation based on workup.  Ongoing aggressive stroke risk factor management  Therapy recommendations:  pending   Disposition:  Anticipate return to SNF  F/u 01/12/15 with Dr.Sethi  Hypertension  Home meds:   Toprol, cozaar, lasix Permissive hypertension (OK if <220/120) for 24-48 hours post stroke and then gradually normalized within 5-7 days. On losartan  Stable, but fluctuating  Hyperlipidemia  Home meds:  liptor 10, resumed in hospital  LDL 93 on 10/14/2014, goal < 70  Continue statin at discharge  Diabetes  HgbA1c 8.4, goal < 7.0  Uncontrolled  On Lantus  SSI  CBG monitoring  DM education  Encouraged weight loss  Morbid obesity  Encouraged weight loss  Healthy diet  Other Stroke Risk Factors  Morbid Obesity, Body mass index is 41.5 kg/(m^2).   Hx stroke/TIA  Hospital day # 1  [facility] See Amion for Pager information 11/18/2014 12:06 PM   I, the attending vascular neurologist, have personally obtained a history, examined the patient, evaluated laboratory data, individually viewed imaging studies and agree with radiology interpretations. I also obtained  additional history from pt's daughter at bedside. Together with the NP/PA, we formulated the assessment and plan of care which reflects our mutual decision.  I have made any additions or clarifications directly to the above note and agree with the findings and plan as currently documented.   64 year old female with history of hypertension, hyperlipidemia, previous CVA was admitted again for recurrent embolic strokes. MRI showed multiple patchy small infarcts bilaterally, consistent with embolic stroke. On last admission one month ago, she had stroke workup for embolic stroke, TEE negative, no PFO, loop recorder placed. On this admission no precordial interrogated, no A. Fib. No need of further stroke workup including CTA neck, pan CT to rule out a  malignancy, and hypercoagulable as well as autoimmune workup. In terms of treatment, either starting Coumadin, or continue observation follow-up with loop recorder, or further decision making with Dr. Pearlean Brownie on 01/12/2015 follow-up. We will make decision depends on current workup.  Marvel Plan, MD PhD Stroke Neurology 11/18/2014 5:21 PM       To contact Stroke Continuity provider, please refer to [email or web address]. After hours, contact General Neurology

## 2014-11-19 ENCOUNTER — Encounter (HOSPITAL_COMMUNITY): Payer: Self-pay | Admitting: Radiology

## 2014-11-19 ENCOUNTER — Inpatient Hospital Stay (HOSPITAL_COMMUNITY): Payer: Medicare Other

## 2014-11-19 DIAGNOSIS — I10 Essential (primary) hypertension: Secondary | ICD-10-CM | POA: Insufficient documentation

## 2014-11-19 DIAGNOSIS — N3000 Acute cystitis without hematuria: Secondary | ICD-10-CM | POA: Insufficient documentation

## 2014-11-19 DIAGNOSIS — R000 Tachycardia, unspecified: Secondary | ICD-10-CM | POA: Insufficient documentation

## 2014-11-19 DIAGNOSIS — Z8673 Personal history of transient ischemic attack (TIA), and cerebral infarction without residual deficits: Secondary | ICD-10-CM | POA: Insufficient documentation

## 2014-11-19 LAB — URINE CULTURE: Colony Count: >=100000

## 2014-11-19 LAB — GLUCOSE, CAPILLARY
GLUCOSE-CAPILLARY: 288 mg/dL — AB (ref 70–99)
Glucose-Capillary: 364 mg/dL — ABNORMAL HIGH (ref 70–99)
Glucose-Capillary: 380 mg/dL — ABNORMAL HIGH (ref 70–99)
Glucose-Capillary: 245 mg/dL — ABNORMAL HIGH (ref 70–99)

## 2014-11-19 LAB — CBC
HEMATOCRIT: 42.1 % (ref 36.0–46.0)
Hemoglobin: 14.2 g/dL (ref 12.0–15.0)
MCH: 31.4 pg (ref 26.0–34.0)
MCHC: 33.7 g/dL (ref 30.0–36.0)
MCV: 93.1 fL (ref 78.0–100.0)
Platelets: 245 10*3/uL (ref 150–400)
RBC: 4.52 MIL/uL (ref 3.87–5.11)
RDW: 13.9 % (ref 11.5–15.5)
WBC: 13.3 10*3/uL — ABNORMAL HIGH (ref 4.0–10.5)

## 2014-11-19 LAB — BASIC METABOLIC PANEL
Anion gap: 8 (ref 5–15)
BUN: 16 mg/dL (ref 6–23)
CALCIUM: 9.8 mg/dL (ref 8.4–10.5)
CO2: 28 mmol/L (ref 19–32)
CREATININE: 1.09 mg/dL (ref 0.50–1.10)
Chloride: 104 mmol/L (ref 96–112)
GFR calc Af Amer: 61 mL/min — ABNORMAL LOW (ref >90)
GFR, EST NON AFRICAN AMERICAN: 52 mL/min — AB (ref >90)
GLUCOSE: 293 mg/dL — AB (ref 70–99)
Potassium: 4.5 mmol/L (ref 3.5–5.1)
Sodium: 140 mmol/L (ref 135–145)

## 2014-11-19 LAB — LUPUS ANTICOAGULANT PANEL
DRVVT: 32.7 s (ref 0.0–55.1)
PTT LA: 33.2 s (ref 0.0–50.0)

## 2014-11-19 LAB — COMPLEMENT, TOTAL: Compl, Total (CH50): >60 U/mL — ABNORMAL HIGH (ref 42–60)

## 2014-11-19 LAB — C3 COMPLEMENT: C3 COMPLEMENT: 177 mg/dL — AB (ref 82–167)

## 2014-11-19 LAB — RPR: RPR: NONREACTIVE

## 2014-11-19 LAB — C4 COMPLEMENT: Complement C4, Body Fluid: 39 mg/dL (ref 14–44)

## 2014-11-19 LAB — HOMOCYSTEINE: Homocysteine: 10.4 umol/L (ref 0.0–15.0)

## 2014-11-19 LAB — HIV ANTIBODY (ROUTINE TESTING W REFLEX): HIV Screen 4th Generation wRfx: NONREACTIVE

## 2014-11-19 MED ORDER — CIPROFLOXACIN HCL 500 MG PO TABS
500.0000 mg | ORAL_TABLET | Freq: Two times a day (BID) | ORAL | Status: DC
  Administered 2014-11-19 – 2014-11-22 (×6): 500 mg via ORAL
  Filled 2014-11-19 (×6): qty 1

## 2014-11-19 MED ORDER — INSULIN ASPART 100 UNIT/ML ~~LOC~~ SOLN
0.0000 [IU] | Freq: Three times a day (TID) | SUBCUTANEOUS | Status: DC
Start: 2014-11-19 — End: 2014-11-21
  Administered 2014-11-19: 11 [IU] via SUBCUTANEOUS
  Administered 2014-11-20: 3 [IU] via SUBCUTANEOUS
  Administered 2014-11-20: 5 [IU] via SUBCUTANEOUS
  Administered 2014-11-20: 8 [IU] via SUBCUTANEOUS
  Administered 2014-11-21 (×2): 5 [IU] via SUBCUTANEOUS

## 2014-11-19 MED ORDER — INSULIN ASPART 100 UNIT/ML ~~LOC~~ SOLN
0.0000 [IU] | Freq: Every day | SUBCUTANEOUS | Status: DC
  Administered 2014-11-19 – 2014-11-20 (×2): 2 [IU] via SUBCUTANEOUS

## 2014-11-19 MED ORDER — IOHEXOL 350 MG/ML SOLN
100.0000 mL | Freq: Once | INTRAVENOUS | Status: AC | PRN
  Administered 2014-11-19: 100 mL via INTRAVENOUS

## 2014-11-19 MED ORDER — SODIUM CHLORIDE 0.9 % IV SOLN
INTRAVENOUS | Status: DC
  Administered 2014-11-19 – 2014-11-20 (×2): via INTRAVENOUS

## 2014-11-19 MED ORDER — INSULIN GLARGINE 100 UNIT/ML ~~LOC~~ SOLN
60.0000 [IU] | Freq: Every day | SUBCUTANEOUS | Status: DC
Start: 2014-11-19 — End: 2014-11-21
  Administered 2014-11-19 – 2014-11-20 (×2): 60 [IU] via SUBCUTANEOUS
  Filled 2014-11-19 (×5): qty 0.6

## 2014-11-19 NOTE — Evaluation (Signed)
Clinical/Bedside Swallow Evaluation Patient Details  Name: Dawn Ho MRN: [id number] Date of Birth: 23-Jul-1951  Today's Date: 11/19/2014 Time: SLP Start Time (ACUTE ONLY): 1603 SLP Stop Time (ACUTE ONLY): 1625 SLP Time Calculation (min) (ACUTE ONLY): 22 min  Past Medical History:  Past Medical History  Diagnosis Date  . Fibromyalgia   . Diabetes mellitus   . Hypertension   . Dyslipidemia   . TIA (transient ischemic attack)   . Depression with anxiety   . CVA (cerebral infarction)    Past Surgical History:  Past Surgical History  Procedure Laterality Date  . Cholecystectomy    . Cesarean section    . Tee without cardioversion N/A 10/15/2014    Procedure: TRANSESOPHAGEAL ECHOCARDIOGRAM (TEE);  Surgeon: Pricilla Riffle, MD;  Location: [facility] ENDOSCOPY;  Service: Cardiovascular;  Laterality: N/A;  . Loop recorder implant      Dr. Johney Frame placed 10/15/2014   HPI:  64 y.o. 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 MRI revealed bilateral punctate acute/subacute infarcts, largest cluster in the posterior right frontal lobe. Old left greater than right basal ganglia and thalami and deep white matter infarcts. FAmily requested repeat evaluation due to coughing wtih liquids.    Assessment / Plan / Recommendation Clinical Impression  Swallowing abilities relatively consistent with initial evaluation complete 3/31 with subtle throat clearing noted post swallow, only with mixed consistencies, suggestive of delayed swallow initiation related to oral deficits. With cueing for oral clearance of solid pos prior to initiation of liquid intake, s/s of decreased airway protection minimized. SLP will f/u for diet tolerance and carryover of strategies, particularly given family report of increased coughing with pos not detected by SLP at bedside. Family present  and educated.     Aspiration Risk  Moderate    Diet Recommendation Regular;Thin liquid   Liquid Administration via: Cup;Straw Medication Administration: Whole meds with liquid Supervision: Patient able to self feed;Full supervision/cueing for compensatory strategies Compensations: Slow rate;Small sips/bites;Check for pocketing (clear solids from oral caivty prior to consuming liquids) Postural Changes and/or Swallow Maneuvers: Seated upright 90 degrees;Upright 30-60 min after meal    Other  Recommendations Oral Care Recommendations: Oral care BID   Follow Up Recommendations  Inpatient Rehab    Frequency and Duration min 2x/week  1 week   Pertinent Vitals/Pain n/a        Swallow Study    General Date of Onset: 11/17/14 HPI: 64 y.o. 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 MRI revealed bilateral punctate acute/subacute infarcts, largest cluster in the posterior right frontal lobe. Old left greater than right basal ganglia and thalami and deep white matter infarcts. FAmily requested repeat evaluation due to coughing wtih liquids.  Type of Study: Bedside swallow evaluation Previous Swallow Assessment: bedside swallowe val 3/31 recommended a regular diet without SLP f/u Diet Prior to this Study: Regular;Thin liquids Temperature Spikes Noted: No Respiratory Status: Room air History of Recent Intubation: No Behavior/Cognition: Cooperative;Alert Oral Cavity - Dentition: Poor condition;Missing dentition Self-Feeding Abilities: Able to feed self Patient Positioning: Upright in bed Baseline Vocal Quality: Low vocal intensity;Clear Volitional Cough: Strong Volitional Swallow: Able to elicit    Oral/Motor/Sensory Function Overall Oral Motor/Sensory Function: Impaired   Ice Chips Ice chips: Not tested   Thin Liquid Thin Liquid:  Impaired  Presentation: Straw Pharyngeal  Phase Impairments: Suspected delayed Swallow;Throat Clearing - Immediate (with mixed consistencies only)    Nectar Thick Nectar Thick Liquid: Not tested   Honey Thick Honey Thick Liquid: Not tested   Puree Puree: Not tested   Solid   GO   Dawn Batie MA, CCC-SLP [phone]  Solid: Within functional limits Presentation: Self Fed       Dawn Ho Dawn Ho 11/19/2014,4:22 PM

## 2014-11-19 NOTE — Progress Notes (Signed)
Inpatient Diabetes Program Recommendations  AACE/ADA: New Consensus Statement on Inpatient Glycemic Control (2013)  Target Ranges:  Prepandial:   less than 140 mg/dL      Peak postprandial:   less than 180 mg/dL (1-2 hours)      Critically ill patients:  140 - 180 mg/dL   Results for Dawn Ho, Lucelia O (MRN [id number]) as of 11/19/2014 10:50  Ref. Range 11/17/2014 22:03 11/18/2014 06:47 11/18/2014 12:03 11/18/2014 17:17 11/18/2014 21:39  Glucose-Capillary Latest Range: 70-99 mg/dL 409209 (H) 811148 (H) 914155 (H) 146 (H) 207 (H)    Reason for assessment: elevated CBG  Diabetes history: Type 2 Outpatient Diabetes medications: Amaryl 2mg /d, Glipizide 10mg /d, Lantus 55 units qhs, Novulin 12units qam, 15 units q. lunch and supper Current orders for Inpatient glycemic control: Lantus 55 units qhs, Novolog 0-9 units tid with meals, Novolog 10 units tid with meals.  CBG remain elevated despite current medications- may want to consider increasing correction insulin to Novolog moderate correction tid. Reluctant to increase basal despite this mornings elevated fasting blood sugars because yesterday fasting was only 148mg /dl.  Susette RacerJulie Lilinoe Acklin, RN, BA, MHA, CDE Diabetes Coordinator Inpatient Diabetes Program  [phone] (Team Pager) [phone] Patrcia Dolly([place] [facility]) 11/19/2014 11:01 AM

## 2014-11-19 NOTE — Progress Notes (Signed)
[facility] PROGRESS NOTE  Dawn Ho ZOX:[id number] DOB: 05/06/1951 DOA: 11/17/2014 PCP: Frederica Kuster, MD Interim summary. 64 y.o lady admitted for numbness of the left side and blurred vision and left sided weakness. She was recently discharged in feb for stroke. MRI of the brain revealed new multiple areas of acute and sub acute infarcts in the right frontal lobe. Neurology consulted and recommendations given.  Assessment/Plan: 1. Recurrent stroke: Admitted to telemetry and neurology consulted. Recent echo and TEE done in 2/26. She underwent loop recorder placement on 2/27 interrogated this admission and no cardiac arrythmias found. Hypercoagulable work up ordered and is negative. Resume aspirin and plavix. CT abdomen, chest and pelvis did not reveal any malignancy. CTA of the neck revealed 25% stenosis or the  Proximal right ICA with irregular plaque with possible ulceration . This could be a potential source of emboli. There ismild atherosclerotic disease on the left proximal ICA with out significant stenosis.   Therapy evals pending. LDL is 93 a month ago. Continue statin.  hgba1c is 8.4, increase the dose of lantus and change to mod SSI.  Currently she is on aspirin 81 mg and plavix of 75mg , she might need anticoagulation, defer to neurology for recommendations.  Plan for SNF on discharge.    Hypertension: Resume home meds.    Diabetes Mellitus: hgba1c is 8.4. CBG (last 3)   Recent Labs  11/18/14 2139 11/19/14 0622 11/19/14 1058  GLUCAP 207* 288* 364*    Increase the dose of lantus to 60 units at bedtime, change to moderate scale SSI.     Morbid obesity.   UTI: Cultures sent and show 1,00,000 E coli and sensitive to ciprofloxacin. Change rocephin to ciprofloxacin.   Tachycardia: Unclear etiology probably from  Physical therapy.     Code Status: full code.  Family Communication: discussed with daughter at bedside Disposition Plan: pending.     Consultants:  Neurology.   Procedures:  MRI brain   CT head  Antibiotics:  Rocpehin.   HPI/Subjective: Comfortable, confused.   Objective: Filed Vitals:   11/19/14 1339  BP: 132/77  Pulse: 127  Temp: 98.5 F (36.9 C)  Resp: 18   No intake or output data in the 24 hours ending 11/19/14 1452 Filed Weights   11/17/14 0934  Weight: 96.389 kg (212 lb 8 oz)    Exam:   General:  Alert afebrile comfortable  Cardiovascular: s1s2  Respiratory: ctab, no wheezing or rhonchi.   Abdomen: soft non tender non distended bowel sounds heard  Musculoskeletal: no pedal edema.   Data Reviewed: Basic Metabolic Panel:  Recent Labs Lab 11/17/14 0928 11/17/14 0935 11/18/14 0629 11/19/14 0558  NA 142 142 141 140  K 3.7 3.9 3.7 4.5  CL 107 105 104 104  CO2 28  --  25 28  GLUCOSE 184* 187* 163* 293*  BUN 12 16 13 16   CREATININE 1.05 0.90 1.04 1.09  CALCIUM 9.4  --  9.5 9.8   Liver Function Tests:  Recent Labs Lab 11/17/14 0928  AST 24  ALT 19  ALKPHOS 81  BILITOT 0.6  PROT 6.9  ALBUMIN 3.3*   No results for input(s): LIPASE, AMYLASE in the last 168 hours. No results for input(s): AMMONIA in the last 168 hours. CBC:  Recent Labs Lab 11/17/14 0928 11/17/14 0935 11/18/14 0629 11/19/14 0558  WBC 9.9  --  9.7 13.3*  NEUTROABS 6.8  --   --   --   HGB 13.4 14.6 14.0 14.2  HCT 40.4 43.0 42.3 42.1  MCV 94.0  --  94.2 93.1  PLT 203  --  195 245   Cardiac Enzymes: No results for input(s): CKTOTAL, CKMB, CKMBINDEX, TROPONINI in the last 168 hours. BNP (last 3 results) No results for input(s): BNP in the last 8760 hours.  ProBNP (last 3 results) No results for input(s): PROBNP in the last 8760 hours.  CBG:  Recent Labs Lab 11/18/14 1203 11/18/14 1717 11/18/14 2139 11/19/14 0622 11/19/14 1058  GLUCAP 155* 146* 207* 288* 364*    Recent Results (from the past 240 hour(s))  Urine culture     Status: None   Collection Time: 11/17/14 12:19 PM   Result Value Ref Range Status   Specimen Description URINE, CATHETERIZED  Final   Special Requests NONE  Final   Colony Count   Final    >=100,000 COLONIES/ML Performed at [facility]    Culture   Final    ESCHERICHIA COLI Performed at [facility]    Report Status 11/19/2014 FINAL  Final   Organism ID, Bacteria ESCHERICHIA COLI  Final      Susceptibility   Escherichia coli - MIC*    AMPICILLIN 4 SENSITIVE Sensitive     CEFAZOLIN <=4 SENSITIVE Sensitive     CEFTRIAXONE <=1 SENSITIVE Sensitive     CIPROFLOXACIN <=0.25 SENSITIVE Sensitive     GENTAMICIN <=1 SENSITIVE Sensitive     LEVOFLOXACIN <=0.12 SENSITIVE Sensitive     NITROFURANTOIN <=16 SENSITIVE Sensitive     TOBRAMYCIN <=1 SENSITIVE Sensitive     TRIMETH/SULFA <=20 SENSITIVE Sensitive     PIP/TAZO <=4 SENSITIVE Sensitive     * ESCHERICHIA COLI     Studies: Ct Angio Neck W/cm &/or Wo/cm  11/19/2014   CLINICAL DATA:  Stroke.  EXAM: CT ANGIOGRAPHY NECK  TECHNIQUE: Multidetector CT imaging of the neck was performed using the standard protocol during bolus administration of intravenous contrast. Multiplanar CT image reconstructions and MIPs were obtained to evaluate the vascular anatomy. Carotid stenosis measurements (when applicable) are obtained utilizing NASCET criteria, using the distal internal carotid diameter as the denominator.  CONTRAST:  OMNIPAQUE IOHEXOL 350 MG/ML SOLN  COMPARISON:  MRI head 11/17/2014  FINDINGS: Aortic arch: Minimal atherosclerotic disease in the aortic arch. Three vessel aortic arch. Proximal great vessels widely patent.  Right carotid system: Right common carotid artery widely patent. Atherosclerotic plaque in the carotid bulb with irregularity and probable ulcerated plaque. This could be a source of emboli. No filling defect. 25% diameter stenosis of the right internal carotid artery. Right external carotid artery widely patent.  Left carotid system: Left common carotid artery  widely patent. Mild atherosclerotic calcification the carotid bulb without significant stenosis  Vertebral arteries:Vertebral arteries are equal in size. Both vertebral arteries are patent to the basilar. Atherosclerotic plaque in the distal vertebral artery causing mild stenosis bilaterally.  Skeleton: Negative  Other neck: Negative for mass or adenopathy in the neck. Lung apices are clear.  IMPRESSION: 25% diameter stenosis of the proximal right internal carotid artery. Irregular plaque with possible ulceration. Potential source of emboli  Mild atherosclerotic disease of the proximal left internal carotid artery without significant stenosis.  Mild stenosis distal vertebral artery bilaterally secondary to atherosclerotic disease.   Electronically Signed   By: Marlan Palau M.D.   On: 11/19/2014 09:52   Ct Chest W Contrast  11/19/2014   CLINICAL DATA:  Recurrent strokes. Evaluation for underlying malignancy.  EXAM: CT CHEST, ABDOMEN, AND PELVIS WITH  CONTRAST  TECHNIQUE: Multidetector CT imaging of the chest, abdomen and pelvis was performed following the standard protocol during bolus administration of intravenous contrast.  CONTRAST:  OMNIPAQUE IOHEXOL 350 MG/ML SOLN  COMPARISON:  Chest radiograph 11/17/2010. CT abdomen and pelvis 03/10/2010.  FINDINGS: CT CHEST FINDINGS  No enlarged axillary, mediastinal, or hilar lymph nodes are identified. Mild-to-moderate LAD coronary artery calcification is noted. The heart appears mildly enlarged. There is mild to moderate thoracic aortic calcification.  There is no pleural or pericardial effusion. Evaluation of the lung parenchyma is mildly limited by motion artifact in the bases. No consolidation or nodules are identified. Major airways appear patent. Old right second through sixth rib fractures and an old left seventh rib fracture are noted.  CT ABDOMEN AND PELVIS FINDINGS  9 mm hypodensity in hepatic segment IVa is unchanged and most likely represents a cyst or  biliary hamartoma. A calcification in the right hepatic lobe is unchanged. The gallbladder is surgically absent. No biliary dilatation is seen. The spleen, adrenal glands, and pancreas are unremarkable. The kidneys are lobular in contour with suggestion of multiple areas of scarring. There are numerous small hypodensities throughout both kidneys, the majority of which are subcentimeter in size and too small to characterize although most likely represent cysts.  There is no evidence of bowel obstruction. No bowel wall thickening is identified. Appendix is unremarkable. The uterus is absent. Bladder is grossly unremarkable. No pelvic mass is seen. Multiple pelvic phleboliths are again noted.  Moderate aortoiliac atherosclerosis is present. No free fluid or enlarged lymph nodes are identified. Skin thickening and subcutaneous stranding and gas in the left lower is likely reflective of subcutaneous medication administration. Sclerotic focus in the left ilium is unchanged, likely a bone island. Mild multilevel lumbar disc degeneration is noted, with vacuum disc L1-2.  IMPRESSION: No evidence of malignancy in the chest, abdomen, or pelvis.   Electronically Signed   By: Sebastian Ache   On: 11/19/2014 09:53   Ct Abdomen Pelvis W Contrast  11/19/2014   CLINICAL DATA:  Recurrent strokes. Evaluation for underlying malignancy.  EXAM: CT CHEST, ABDOMEN, AND PELVIS WITH CONTRAST  TECHNIQUE: Multidetector CT imaging of the chest, abdomen and pelvis was performed following the standard protocol during bolus administration of intravenous contrast.  CONTRAST:  OMNIPAQUE IOHEXOL 350 MG/ML SOLN  COMPARISON:  Chest radiograph 11/17/2010. CT abdomen and pelvis 03/10/2010.  FINDINGS: CT CHEST FINDINGS  No enlarged axillary, mediastinal, or hilar lymph nodes are identified. Mild-to-moderate LAD coronary artery calcification is noted. The heart appears mildly enlarged. There is mild to moderate thoracic aortic calcification.  There  is no pleural or pericardial effusion. Evaluation of the lung parenchyma is mildly limited by motion artifact in the bases. No consolidation or nodules are identified. Major airways appear patent. Old right second through sixth rib fractures and an old left seventh rib fracture are noted.  CT ABDOMEN AND PELVIS FINDINGS  9 mm hypodensity in hepatic segment IVa is unchanged and most likely represents a cyst or biliary hamartoma. A calcification in the right hepatic lobe is unchanged. The gallbladder is surgically absent. No biliary dilatation is seen. The spleen, adrenal glands, and pancreas are unremarkable. The kidneys are lobular in contour with suggestion of multiple areas of scarring. There are numerous small hypodensities throughout both kidneys, the majority of which are subcentimeter in size and too small to characterize although most likely represent cysts.  There is no evidence of bowel obstruction. No bowel wall thickening is  identified. Appendix is unremarkable. The uterus is absent. Bladder is grossly unremarkable. No pelvic mass is seen. Multiple pelvic phleboliths are again noted.  Moderate aortoiliac atherosclerosis is present. No free fluid or enlarged lymph nodes are identified. Skin thickening and subcutaneous stranding and gas in the left lower is likely reflective of subcutaneous medication administration. Sclerotic focus in the left ilium is unchanged, likely a bone island. Mild multilevel lumbar disc degeneration is noted, with vacuum disc L1-2.  IMPRESSION: No evidence of malignancy in the chest, abdomen, or pelvis.   Electronically Signed   By: Sebastian Ache   On: 11/19/2014 09:53    Scheduled Meds: . aspirin EC  81 mg Oral Daily  . atorvastatin  20 mg Oral q1800  . cefTRIAXone (ROCEPHIN)  IV  1 g Intravenous Q24H  . clopidogrel  75 mg Oral Daily  . cyanocobalamin  1,000 mcg Intramuscular Q30 days  . furosemide  60 mg Oral Daily  . gabapentin  300 mg Oral QHS  . heparin  5,000 Units  Subcutaneous 3 times per day  . insulin aspart  0-9 Units Subcutaneous TID WC  . insulin aspart  10 Units Subcutaneous TID WC  . insulin glargine  55 Units Subcutaneous QHS  . levothyroxine  125 mcg Oral Daily  . LORazepam  1 mg Oral BID  . losartan  100 mg Oral Daily  . pantoprazole  40 mg Oral Daily  . PARoxetine  20 mg Oral Daily  . potassium chloride SA  20 mEq Oral Daily  . sodium chloride  3 mL Intravenous Q12H   Continuous Infusions:   Principal Problem:   Left-sided weakness Active Problems:   Fibromyalgia   Diabetes mellitus   Obesity   Stroke    Time spent: 25 minutes    Sekou Zuckerman  [facility] Pager [phone] If 7PM-7AM, please contact night-coverage at [email or web address], password [facility] 11/19/2014, 2:52 PM  LOS: 1 day

## 2014-11-19 NOTE — Clinical Social Work Note (Signed)
Clinical Social Worker continues to follow patient and pt's family for continued support and to facilitate pt's discharge needs once medically stable. CSW has faxed pt's clinicals [facility] for pt's return to SNF.   FL-2 on chart for MD signature.   Derenda FennelBashira Eliese Kerwood, MSW, LCSWA [phone] 11/19/2014 10:27 AM

## 2014-11-19 NOTE — Care Management Note (Signed)
    Page 1 of 1   11/19/2014     4:42:14 PM CARE MANAGEMENT NOTE 11/19/2014  Patient:  Dawn Ho,Dawn Ho   Account Number:  [id number]  Date Initiated:  11/19/2014  Documentation initiated by:  Elmer BalesOBARGE,Cambryn Charters  Subjective/Objective Assessment:   Patient was admitted with CVA. [facility]     Action/Plan:   Will follow for discharge needs   Anticipated DC Date:     Anticipated DC Plan:  SKILLED NURSING FACILITY  In-house referral  Clinical Social Worker         Choice offered to / List presented to:             Status of service:   Medicare Important Message given?  YES (If response is "NO", the following Medicare IM given date fields will be blank) Date Medicare IM given:  11/19/2014 Medicare IM given by:  Elmer BalesOBARGE,Jaquay Morneault Date Additional Medicare IM given:   Additional Medicare IM given by:    Discharge Disposition:    Per UR Regulation:  Reviewed for med. necessity/level of care/duration of stay  If discussed at Long Length of Stay Meetings, dates discussed:    Comments:

## 2014-11-19 NOTE — Progress Notes (Signed)
STROKE TEAM PROGRESS NOTE   HISTORY Dawn Ho is an 64 y.o. female who reports that last evening she noted numbness on her left and blurred vision when she awakened from her sleep at 2300 (LKW 11/16/2014 at 2100). She went back to sleep and when she awakened this morning the staff noted she was dragging her left leg. She also had a facial droop and had noted continued left sided numbness. EMS was called at that time and the patient was brought in as a code stroke. Patient was not administered TPA secondary to  outside treatment window, recent CVA. She was admitted for further evaluation and treatment.   SUBJECTIVE (INTERVAL HISTORY) Husband at bedside. No definite etiology for stroke at this time; however, Dr. Erlinda Hong believes there is an embolic source. He gave the patient the option of initiating Coumadin therapy versus continued monitoring and follow-up with Dr. Leonie Man. Discussed with pt and husband at bedside, and they would like to continue observation and the follow-up with Dr. Leonie Man in May to make further decisions.   OBJECTIVE Temp:  [97.5 F (36.4 C)-98.6 F (37 C)] 97.5 F (36.4 C) (04/01 0938) Pulse Rate:  [100-126] 126 (04/01 0938) Cardiac Rhythm:  [-] Sinus tachycardia (03/31 2000) Resp:  [18-20] 20 (04/01 0938) BP: (129-159)/(67-93) 147/73 mmHg (04/01 0938) SpO2:  [91 %-97 %] 97 % (04/01 0938)   Recent Labs Lab 11/18/14 1203 11/18/14 1717 11/18/14 2139 11/19/14 0622 11/19/14 1058  GLUCAP 155* 146* 207* 288* 364*    Recent Labs Lab 11/17/14 0928 11/17/14 0935 11/18/14 0629 11/19/14 0558  NA 142 142 141 140  K 3.7 3.9 3.7 4.5  CL 107 105 104 104  CO2 28  --  25 28  GLUCOSE 184* 187* 163* 293*  BUN '12 16 13 16  ' CREATININE 1.05 0.90 1.04 1.09  CALCIUM 9.4  --  9.5 9.8    Recent Labs Lab 11/17/14 0928  AST 24  ALT 19  ALKPHOS 81  BILITOT 0.6  PROT 6.9  ALBUMIN 3.3*    Recent Labs Lab 11/17/14 0928 11/17/14 0935 11/18/14 0629 11/19/14 0558  WBC  9.9  --  9.7 13.3*  NEUTROABS 6.8  --   --   --   HGB 13.4 14.6 14.0 14.2  HCT 40.4 43.0 42.3 42.1  MCV 94.0  --  94.2 93.1  PLT 203  --  195 245   No results for input(s): CKTOTAL, CKMB, CKMBINDEX, TROPONINI in the last 168 hours.  Recent Labs  11/17/14 0928 11/17/14 1356  LABPROT 13.8 13.7  INR 1.05 1.04    Recent Labs  11/17/14 1100  COLORURINE YELLOW  LABSPEC 1.018  PHURINE 6.0  GLUCOSEU NEGATIVE  HGBUR NEGATIVE  BILIRUBINUR NEGATIVE  KETONESUR NEGATIVE  PROTEINUR NEGATIVE  UROBILINOGEN 0.2  NITRITE POSITIVE*  LEUKOCYTESUR MODERATE*       Component Value Date/Time   CHOL 207* 10/14/2014 0419   TRIG 358* 10/14/2014 0419   HDL 42 10/14/2014 0419   CHOLHDL 4.9 10/14/2014 0419   VLDL 72* 10/14/2014 0419   LDLCALC 93 10/14/2014 0419   Lab Results  Component Value Date   HGBA1C 8.4* 11/17/2014      Component Value Date/Time   LABOPIA NONE DETECTED 11/17/2014 1100   COCAINSCRNUR NONE DETECTED 11/17/2014 1100   LABBENZ POSITIVE* 11/17/2014 1100   AMPHETMU NONE DETECTED 11/17/2014 1100   THCU NONE DETECTED 11/17/2014 1100   LABBARB NONE DETECTED 11/17/2014 1100     Recent Labs Lab 11/17/14 7048  ETH <5   I have personally reviewed the radiological images below and agree with the radiology interpretations.  Ct Head Wo Contrast 11/17/2014    Chronic ischemic changes are stable.  No acute abnormality.    10/13/2014  Progressed chronic small vessel ischemia since 2012. No acute cortically based infarct or acute intracranial hemorrhage identified.  Mr Brain Wo Contrast 11/17/2014   1. Recurrent acute small bilateral cerebral white matter infarcts, maximal today in the right corona radiata. No acute hemorrhage or mass effect. Synchronous small vessel ischemia versus recent embolic event are the top differential considerations. 2. Underlying severe chronic small vessel disease.    Mr Jodene Nam Head Wo Contrast 10/13/2014  1. Multiple punctate acute/subacute  infarcts of varying ages compatible with a history of symptoms over the last 3 weeks. The largest cluster of infarcts is in the posterior right frontal lobe, compatible with left-sided numbness and weakness.  2. Other more remote infarcts are present within the left greater than right basal ganglia and thalami as well as within the deep white matter.  3. Extensive small vessel white matter changes are present bilaterally.  4. Extensive branch vessel stenoses and small vessel disease in both the anterior and posterior circulation.  5. Prominent proximal stenoses in M2 branches bilaterally.  6. No significant stenosis of the medium or large intracranial vessels proximal to the MCA bifurcations.  2D echo 10/14/14 - Left ventricle: The cavity size was normal. Systolic function was normal. The estimated ejection fraction was in the range of 55% to 60%. Wall motion was normal; there were no regional wall motion abnormalities. Features are consistent with a pseudonormal left ventricular filling pattern, with concomitant abnormal relaxation and increased filling pressure (grade 2 diastolic dysfunction).  TEE 10/15/14 - LA, LAA without masses No PFO by color doppler or with injection of agitated saline MV is normal Mild to mod MR TV normal AV mildly thickened. Trace AI PV grossly normal LVEF normal Mild fixed plaque in thoracic aorta   CUS 10/14/14 - Bilateral: intimal wall thickening CCA. Mild mixed plaque origin ICA. 1-39% ICA stenosis. Vertebral artery flow is antegrade.  CTA Neck 11/19/2014 25% diameter stenosis of the proximal right internal carotid artery. Irregular plaque with possible ulceration. Potential source of emboli Mild atherosclerotic disease of the proximal left internal carotid artery without significant stenosis. Mild stenosis distal vertebral artery bilaterally secondary to atherosclerotic disease.  CT Chest, Abdomen, and Pelvis with contrast 11/19/2014 No  evidence of malignancy in the chest, abdomen, or pelvis.  PHYSICAL EXAM  Temp:  [97.5 F (36.4 C)-98.6 F (37 C)] 97.5 F (36.4 C) (04/01 0938) Pulse Rate:  [100-126] 126 (04/01 0938) Resp:  [18-20] 20 (04/01 0938) BP: (129-159)/(67-93) 147/73 mmHg (04/01 0938) SpO2:  [91 %-97 %] 97 % (04/01 0938)  General - morbid obesity, well developed, lethargic, no acute distress.  Ophthalmologic - none not visualized due to small pupils.  Cardiovascular - regular rhythm, tachycardia.  Mental Status -  Mild lethargy, orientation to time, place, and person were intact. Language including expression, naming, repetition, comprehension was assessed and found intact. Fund of Knowledge was assessed and was impaired, does not no current and previous presidents.  Cranial Nerves II - XII - II - Visual field intact OU. III, IV, VI - Extraocular movements intact. V - Facial sensation intact bilaterally. VII - right nasolabial fold flattening. VIII - Hearing & vestibular intact bilaterally. X - Palate elevates symmetrically. XI - Chin turning & shoulder shrug intact bilaterally. XII -  Tongue protrusion intact.  Motor Strength - The patient's strength was 3/5 LUE and 4+/5 LLE, and 5-/5 RUE and RLE.  Bulk was normal and fasciculations were absent.   Motor Tone - Muscle tone was assessed at the neck and appendages and was normal.  Reflexes - The patient's reflexes were 1+ in all extremities and she had no pathological reflexes.  Sensory - Light touch, temperature/pinprick were assessed and were symmetrical.    Coordination - The patient had normal movements in the right hand with no ataxia or dysmetria.  Tremor was absent.  Gait and Station - not tested due to safety concerns and weakness and lethargy.   ASSESSMENT/PLAN Dawn Ho is a 64 y.o. female with long history of stroke with residual L weakness, obesity, fibromyalgia, hypertension and dyslipidemia in setting of recent bilateral  ischemic infarcts (largest in R frontal lobe) presenting with blurry vision and worsening L hemiparesis. Patient was currently in SNF for rehab. . She did not receive IV t-PA due to recent stroke.   Recurrent embolic stroke:  New multiple scattered patchy bilateral infarcts, largest in the R corona radiata in setting of bilateral infarcts one month ago, all infarcts felt to be embolic secondary to unknown source. Her CTA neck showed right ICA potential ulcerated plaques, however, she has bilateral infarcts which not able to explained solely but ulcerated plaques and she is already on dural antiplatelet. No clear etiology found so far.  Resultant  L hemiparesis  MRI  scattered bilateral infarcts, largest in R corona radiata  Loop placed 10/16/2014, interrogated this admission. No atrial fibrillation found.  Past workup:  MRA Extensive anterior and posterior branch vessel stenoses and small vessel disease. proximal B M2 stenoses  Carotid Doppler -1-39% ICA stenosis. Vertebral artery flow is antegrade.  2D Echo - EF 55-60%. No cardiac source of emboli identified.  TEE - no PFO or evidence of thrombus   CT of chest/abd/pelvis with contrast - no evidence of malignancy.  CTA Neck - irregular plaque with possible ulceration R ICA. Potential source of emboli.  Hypercoagulable and autoimmune workup as below  C3 complement - 177 (H)  C4 complement - 39 (WNL)  HIV - NR  RPR - NR  ESR - 40 (H)  Others pending  Heparin 5000 units sq tid for VTE prophylaxis Diet Carb Modified Fluid consistency:: Thin; Room service appropriate?: Yes  aspirin 81 mg orally every day and clopidogrel 75 mg orally every day prior to admission, now on aspirin 81 mg orally every day and clopidogrel 75 mg orally every day. Continue dual antiplatelet for stroke prevention.  Ongoing aggressive stroke risk factor management  Therapy recommendations:  SNF with 24/7 supervision / asistance.  Disposition:   Anticipate return to SNF  F/u 01/12/15 with Dr.Sethi as scheduled. Discussed with patient and husband at bedside, regarding treatment plan either treatment with Coumadin, continue observation and the follow-up loop recorder, or follow-up with Dr. Leonie Man in May for further decision. Patient and family decided to follow-up with Dr. city in May for further decision.   Tachycardia  Heart rate 120s  Sinus rhythm  unclear etiology  Mainly related to hyperglycemia, dehydration or UTI/urosepsis  Manage per primary team  UTI  UA WBC 21-50  On by mouth Cipro twice a day  WBC 13.3, trending up from yesterday 9.7  Diabetes  HgbA1c 8.4, goal < 7.0  Uncontrolled  On Lantus  SSI  CBG monitoring, Still at the 300s   DM education  Encouraged  weight loss  Manage per primary team  Hypertension  Home meds:   Toprol, cozaar, lasix Permissive hypertension (OK if <220/120) for 24-48 hours post stroke and then gradually normalized within 5-7 days. On losartan  Stable  Hyperlipidemia  Home meds:  liptor 10, resumed in hospital  LDL 93 on 10/14/2014, goal < 70  Continue statin at discharge  Morbid obesity  Encouraged weight loss  Healthy diet   Body mass index is 41.5 kg/(m^2).   Other Stroke Risk Factors  Hx stroke/TIA  Hospital day # 1  RINEHULS, [place] for Pager information 11/19/2014 1:20 PM   I, the attending vascular neurologist, have personally obtained a history, examined the patient, evaluated laboratory data, individually viewed imaging studies and agree with radiology interpretations. I also obtained additional history from pt's husband at bedside. Together with the NP/PA, we formulated the assessment and plan of care which reflects our mutual decision.  I have made any additions or clarifications directly to the above note and agree with the findings and plan as currently documented.   64 year old female with history of  hypertension, hyperlipidemia, previous CVA was admitted again for recurrent embolic strokes. MRI showed multiple patchy small infarcts bilaterally, consistent with embolic stroke. On last admission one month ago, she had stroke workup for embolic stroke, TEE negative, no PFO, loop recorder placed. On this admission no precordial interrogated, no A. Fib. Further stroke workup including CTA neck showed no significant stenosis but right ICA possible ulcerated plaque, and pan CT ruled out a malignancy, and pending hypercoagulable as well as autoimmune workup. In terms of treatment, either starting Coumadin, or continue observation follow-up with loop recorder, or further decision making with Dr. Leonie Man on 01/12/2015 follow-up. Discussed with patient and husband at bedside, they would like to follow up with Dr. Leonie Man in May to make further decision regarding antipronation. Continue aspirin and Plavix, aggressively controlled risk factors.  Neurology will sign off. Please call with questions. Pt will follow up with Dr. Leonie Man on 01/12/15 as scheduled. Thanks for the consult.  Rosalin Hawking, MD PhD Stroke Neurology 11/19/2014 5:19 PM   To contact Stroke Continuity provider, please refer to [email or web address] After hours, contact General Neurology

## 2014-11-19 NOTE — Progress Notes (Signed)
Pt had 6 beat run of V tach, MD notified

## 2014-11-19 NOTE — Evaluation (Signed)
Occupational Therapy Evaluation Patient Details Name: Dawn Ho MRN: [id number] DOB: [birth year] Today's Date: 11/19/2014    History of Present Illness Patient admitted from [facility] with left side weakness and blurred vision.  MRI showing multiple areas of infarct bilateral white matter, most prominent in corona radiata.   Clinical Impression   Pt from [facility] and reports that she was mostly in w/c with assist for transfers. Suspect pt required assistance for ADLs. Pt presents with decreased cognition, decreased functional mobility, and decreased functional use of LUE. Pt will benefit from acute OT to address ROM/positioning of LUE, functional use of LUE in ADLs, and functional transfers. Recommend return to SNF at d/c for safety.     Follow Up Recommendations  SNF;Supervision/Assistance - 24 hour    Equipment Recommendations  None recommended by OT    Recommendations for Other Services       Precautions / Restrictions Precautions Precautions: Fall Precaution Comments: Per PT note, pt has hx of multiple falls at SNF due to pt forgetting she needs help and tries to walk to the bathroom Restrictions Weight Bearing Restrictions: No      Mobility Bed Mobility Overal bed mobility: Needs Assistance Bed Mobility: Supine to Sit     Supine to sit: Mod assist     General bed mobility comments: Assist to lift trunk and rotate hips to EOB with bed pad. Guided use of RUE on bed rail.   Transfers Overall transfer level: Needs assistance Equipment used: Rolling walker (2 wheeled) Transfers: Sit to/from [employer] Sit to Stand: +2 physical assistance;Min assist Stand pivot transfers: Mod assist;+2 physical assistance       General transfer comment: Increased time to step to pivot to chair due to LLE weakness and difficulty clearing floor. Pt required manual assist to step forward with LLE. Facilitated right weight shift to assist with clearance.      Balance Overall balance assessment: History of Falls                                          ADL Overall ADL's : Needs assistance/impaired Eating/Feeding: Set up;Sitting   Grooming: Set up;Sitting   Upper Body Bathing: Minimal assitance;Sitting   Lower Body Bathing: Moderate assistance;+2 for physical assistance;Sit to/from stand   Upper Body Dressing : Moderate assistance;Sitting   Lower Body Dressing: Maximal assistance;+2 for physical assistance;Sit to/from stand   Toilet Transfer: Moderate assistance;Stand-pivot;+2 for physical assistance;RW (bed>recliner)           Functional mobility during ADLs: Moderate assistance;Rolling walker General ADL Comments: Pt with decreased ability to step with LLE and required assist to shift weight to right side and manually lift LLE off floor. Pt presents with L foot drop. Cognitive deficits limit pt's safety.      Vision Vision Assessment?: Vision impaired- to be further tested in functional context Eye Alignment: Within Functional Limits Ocular Range of Motion: Other (comment) (difficult to assess due to cognition) Tracking/Visual Pursuits: Impaired - to be further tested in functional context (pt with difficulty following commands for testing. ) Additional Comments: Pt had difficulty following commands for vision screen. When covering right eye, pt turned head to the right to focus on OT better. Pt reports blurriness in Left eye (but not right eye). Denies blurriness with both eyes open. No eyestrain or HA.  Pertinent Vitals/Pain Pain Assessment: Faces Faces Pain Scale: No hurt     Hand Dominance Right   Extremity/Trunk Assessment Upper Extremity Assessment Upper Extremity Assessment: LUE deficits/detail LUE Deficits / Details: shoulder flexion 2-/5, elbow flexion 2-/5, weak grip; sensation WNL per pt, mild increased tone   Lower Extremity Assessment Lower Extremity Assessment: Defer to PT  evaluation   Cervical / Trunk Assessment Cervical / Trunk Assessment: Normal   Communication Communication Communication: No difficulties   Cognition Arousal/Alertness: Awake/alert Behavior During Therapy: Flat affect Overall Cognitive Status: No family/caregiver present to determine baseline cognitive functioning Area of Impairment: Orientation;Following commands;Safety/judgement;Awareness;Problem solving;Memory Orientation Level: Disoriented to;Time (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    General Comments       Exercises Exercises: Other exercises Other Exercises Other Exercises: gentle PROM provided to LUE.    Shoulder Instructions      Home Living Family/patient expects to be discharged to:: Skilled nursing facility                                        Prior Functioning/Environment Level of Independence: Needs assistance  Gait / Transfers Assistance Needed: Pt reports that she is mostly in w/c but is able to transfer with assistance and RW.           OT Diagnosis: Generalized weakness;Cognitive deficits;Disturbance of vision   OT Problem List: Decreased strength;Decreased activity tolerance;Impaired balance (sitting and/or standing);Impaired vision/perception;Decreased coordination;Decreased cognition;Decreased safety awareness;Decreased knowledge of use of DME or AE;Impaired UE functional use   OT Treatment/Interventions: Self-care/ADL training;Therapeutic exercise;DME and/or AE instruction;Therapeutic  activities;Cognitive remediation/compensation;Visual/perceptual remediation/compensation;Patient/family education;Balance training    OT Goals(Current goals can be found in the care plan section) Acute Rehab OT Goals Patient Stated Goal: To drink orange juice OT Goal Formulation: With patient Time For Goal Achievement: 12/03/14 Potential to Achieve Goals: Good ADL Goals Pt Will Perform Grooming: with min assist;standing Pt Will Perform Upper Body Bathing: with set-up;sitting Pt Will Perform Upper Body Dressing: with set-up;sitting Pt Will Transfer to Toilet: with min assist;stand pivot transfer  OT Frequency: Min 2X/week   Barriers to D/C: Decreased caregiver support             End of Session Equipment Utilized During Treatment: Rolling walker;Gait belt Nurse Communication: Mobility status  Activity Tolerance: Patient tolerated treatment well Patient left: in chair;with call bell/phone within reach;with chair alarm set   Time: 0922-0950 OT Time Calculation (min): 28 min Charges:  OT General Charges $OT Visit: 1 Procedure OT Evaluation $Initial OT Evaluation Tier I: 1 Procedure OT Treatments $Self Care/Home Management : 8-22 mins G-Codes:    Rae Lips 12/02/2014, 10:16 AM  Carney Living, OTR/L Occupational Therapist [phone] (pager)

## 2014-11-20 DIAGNOSIS — E785 Hyperlipidemia, unspecified: Secondary | ICD-10-CM | POA: Insufficient documentation

## 2014-11-20 DIAGNOSIS — N3000 Acute cystitis without hematuria: Secondary | ICD-10-CM

## 2014-11-20 LAB — CBC
HCT: 40.7 % (ref 36.0–46.0)
Hemoglobin: 13.3 g/dL (ref 12.0–15.0)
MCH: 30.3 pg (ref 26.0–34.0)
MCHC: 32.7 g/dL (ref 30.0–36.0)
MCV: 92.7 fL (ref 78.0–100.0)
PLATELETS: 246 10*3/uL (ref 150–400)
RBC: 4.39 MIL/uL (ref 3.87–5.11)
RDW: 14.1 % (ref 11.5–15.5)
WBC: 16.6 10*3/uL — AB (ref 4.0–10.5)

## 2014-11-20 LAB — GLUCOSE, CAPILLARY
GLUCOSE-CAPILLARY: 187 mg/dL — AB (ref 70–99)
GLUCOSE-CAPILLARY: 206 mg/dL — AB (ref 70–99)
GLUCOSE-CAPILLARY: 239 mg/dL — AB (ref 70–99)
Glucose-Capillary: 179 mg/dL — ABNORMAL HIGH (ref 70–99)
Glucose-Capillary: 288 mg/dL — ABNORMAL HIGH (ref 70–99)

## 2014-11-20 LAB — BASIC METABOLIC PANEL
ANION GAP: 7 (ref 5–15)
BUN: 20 mg/dL (ref 6–23)
CALCIUM: 9.5 mg/dL (ref 8.4–10.5)
CO2: 27 mmol/L (ref 19–32)
Chloride: 106 mmol/L (ref 96–112)
Creatinine, Ser: 1.12 mg/dL — ABNORMAL HIGH (ref 0.50–1.10)
GFR, EST AFRICAN AMERICAN: 59 mL/min — AB (ref >90)
GFR, EST NON AFRICAN AMERICAN: 51 mL/min — AB (ref >90)
GLUCOSE: 196 mg/dL — AB (ref 70–99)
POTASSIUM: 3.6 mmol/L (ref 3.5–5.1)
Sodium: 140 mmol/L (ref 135–145)

## 2014-11-20 LAB — PROTIME-INR
INR: 1.08 (ref 0.00–1.49)
Prothrombin Time: 14.1 seconds (ref 11.6–15.2)

## 2014-11-20 LAB — TROPONIN I
Troponin I: 0.03 ng/mL (ref <0.031)
Troponin I: <0.03 ng/mL (ref <0.031)

## 2014-11-20 MED ORDER — METOPROLOL TARTRATE 12.5 MG HALF TABLET
12.5000 mg | ORAL_TABLET | Freq: Two times a day (BID) | ORAL | Status: DC
Start: 2014-11-20 — End: 2014-11-22
  Administered 2014-11-20 – 2014-11-22 (×5): 12.5 mg via ORAL
  Filled 2014-11-20 (×5): qty 1

## 2014-11-20 MED ORDER — WARFARIN - PHARMACIST DOSING INPATIENT: Freq: Every day | Status: DC

## 2014-11-20 MED ORDER — WARFARIN VIDEO: Freq: Once | Status: DC

## 2014-11-20 MED ORDER — COUMADIN BOOK
Freq: Once | Status: AC
  Administered 2014-11-20: 17:00:00
  Filled 2014-11-20: qty 1

## 2014-11-20 MED ORDER — INSULIN ASPART 100 UNIT/ML ~~LOC~~ SOLN
12.0000 [IU] | Freq: Three times a day (TID) | SUBCUTANEOUS | Status: DC
Start: 2014-11-20 — End: 2014-11-22
  Administered 2014-11-20 – 2014-11-22 (×7): 12 [IU] via SUBCUTANEOUS

## 2014-11-20 MED ORDER — WARFARIN SODIUM 5 MG PO TABS
5.0000 mg | ORAL_TABLET | Freq: Once | ORAL | Status: AC
  Administered 2014-11-20: 5 mg via ORAL
  Filled 2014-11-20: qty 1

## 2014-11-20 NOTE — Progress Notes (Signed)
[facility] PROGRESS NOTE  Dawn Ho LKG:[id number] DOB: May 11, 1951 DOA: 11/17/2014 PCP: Frederica Kuster, MD Interim summary. 64 y.o lady admitted for numbness of the left side and blurred vision and left sided weakness. She was recently discharged in feb for stroke. MRI of the brain revealed new multiple areas of acute and sub acute infarcts in the right frontal lobe. Neurology consulted and recommendations given.  Assessment/Plan: 1. Recurrent stroke: Admitted to telemetry and neurology consulted. Recent echo and TEE done in 2/26. She underwent loop recorder placement on 2/27 interrogated this admission and no cardiac arrythmias found. Hypercoagulable work up ordered and is negative. Resume aspirin and plavix. CT abdomen, chest and pelvis did not reveal any malignancy. CTA of the neck revealed 25% stenosis or the  Proximal right ICA with irregular plaque with possible ulceration . This could be a potential source of emboli. There ismild atherosclerotic disease on the left proximal ICA with out significant stenosis.   Therapy evals pending. LDL is 93 a month ago. Continue statin.  hgba1c is 8.4, increased the dose of lantus and change to mod SSI.  Currently she is on aspirin 81 mg and plavix of 75mg , added coumadin for the above. When iNR is greater than two , she can be safely taken off the plavix and aspirin.  Plan for SNF on discharge.    Hypertension: Better controlled.  Resume home meds.    Diabetes Mellitus: hgba1c is 8.4. CBG (last 3)   Recent Labs  11/19/14 2155 11/20/14 0652 11/20/14 1155  GLUCAP 245* 187* 206*    Increase the dose of lantus to 60 units at bedtime, change to moderate scale SSI.  Added novlog  TIDAC.     Morbid obesity.   UTI: Cultures sent and show 1,00,000 E coli and sensitive to ciprofloxacin. Changed rocephin to ciprofloxacin.  Continue cipro to complete the course.   Tachycardia: Sinus, resolved with metoprolol.    Code Status:  full code.  Family Communication: discussed with daughter at bedside Disposition Plan: pending.    Consultants:  Neurology.   Procedures:  MRI brain   CT head  Antibiotics:  [place].   HPI/Subjective: Comfortable, confused.   Objective: Filed Vitals:   11/20/14 1335  BP: 117/60  Pulse: 125  Temp: 98.6 F (37 C)  Resp: 18    Intake/Output Summary (Last 24 hours) at 11/20/14 1627 Last data filed at 11/20/14 0800  Gross per 24 hour  Intake     80 ml  Output      0 ml  Net     80 ml   Filed Weights   11/17/14 0934  Weight: 96.389 kg (212 lb 8 oz)    Exam:   General:  Alert afebrile comfortable  Cardiovascular: s1s2  Respiratory: ctab, no wheezing or rhonchi.   Abdomen: soft non tender non distended bowel sounds heard  Musculoskeletal: no pedal edema.   Data Reviewed: Basic Metabolic Panel:  Recent Labs Lab 11/17/14 0928 11/17/14 0935 11/18/14 0629 11/19/14 0558 11/20/14 0530  NA 142 142 141 140 140  K 3.7 3.9 3.7 4.5 3.6  CL 107 105 104 104 106  CO2 28  --  25 28 27   GLUCOSE 184* 187* 163* 293* 196*  BUN 12 16 13 16 20   CREATININE 1.05 0.90 1.04 1.09 1.12*  CALCIUM 9.4  --  9.5 9.8 9.5   Liver Function Tests:  Recent Labs Lab 11/17/14 0928  AST 24  ALT 19  ALKPHOS 81  BILITOT  0.6  PROT 6.9  ALBUMIN 3.3*   No results for input(s): LIPASE, AMYLASE in the last 168 hours. No results for input(s): AMMONIA in the last 168 hours. CBC:  Recent Labs Lab 11/17/14 0928 11/17/14 0935 11/18/14 0629 11/19/14 0558 11/20/14 0530  WBC 9.9  --  9.7 13.3* 16.6*  NEUTROABS 6.8  --   --   --   --   HGB 13.4 14.6 14.0 14.2 13.3  HCT 40.4 43.0 42.3 42.1 40.7  MCV 94.0  --  94.2 93.1 92.7  PLT 203  --  195 245 246   Cardiac Enzymes: No results for input(s): CKTOTAL, CKMB, CKMBINDEX, TROPONINI in the last 168 hours. BNP (last 3 results) No results for input(s): BNP in the last 8760 hours.  ProBNP (last 3 results) No results for  input(s): PROBNP in the last 8760 hours.  CBG:  Recent Labs Lab 11/19/14 1058 11/19/14 1630 11/19/14 2155 11/20/14 0652 11/20/14 1155  GLUCAP 364* 380* 245* 187* 206*    Recent Results (from the past 240 hour(s))  Urine culture     Status: None   Collection Time: 11/17/14 12:19 PM  Result Value Ref Range Status   Specimen Description URINE, CATHETERIZED  Final   Special Requests NONE  Final   Colony Count   Final    >=100,000 COLONIES/ML Performed at [facility]    Culture   Final    ESCHERICHIA COLI Performed at [facility]    Report Status 11/19/2014 FINAL  Final   Organism ID, Bacteria ESCHERICHIA COLI  Final      Susceptibility   Escherichia coli - MIC*    AMPICILLIN 4 SENSITIVE Sensitive     CEFAZOLIN <=4 SENSITIVE Sensitive     CEFTRIAXONE <=1 SENSITIVE Sensitive     CIPROFLOXACIN <=0.25 SENSITIVE Sensitive     GENTAMICIN <=1 SENSITIVE Sensitive     LEVOFLOXACIN <=0.12 SENSITIVE Sensitive     NITROFURANTOIN <=16 SENSITIVE Sensitive     TOBRAMYCIN <=1 SENSITIVE Sensitive     TRIMETH/SULFA <=20 SENSITIVE Sensitive     PIP/TAZO <=4 SENSITIVE Sensitive     * ESCHERICHIA COLI     Studies: Ct Angio Neck W/cm &/or Wo/cm  11/19/2014   CLINICAL DATA:  Stroke.  EXAM: CT ANGIOGRAPHY NECK  TECHNIQUE: Multidetector CT imaging of the neck was performed using the standard protocol during bolus administration of intravenous contrast. Multiplanar CT image reconstructions and MIPs were obtained to evaluate the vascular anatomy. Carotid stenosis measurements (when applicable) are obtained utilizing NASCET criteria, using the distal internal carotid diameter as the denominator.  CONTRAST:  OMNIPAQUE IOHEXOL 350 MG/ML SOLN  COMPARISON:  MRI head 11/17/2014  FINDINGS: Aortic arch: Minimal atherosclerotic disease in the aortic arch. Three vessel aortic arch. Proximal great vessels widely patent.  Right carotid system: Right common carotid artery widely patent.  Atherosclerotic plaque in the carotid bulb with irregularity and probable ulcerated plaque. This could be a source of emboli. No filling defect. 25% diameter stenosis of the right internal carotid artery. Right external carotid artery widely patent.  Left carotid system: Left common carotid artery widely patent. Mild atherosclerotic calcification the carotid bulb without significant stenosis  Vertebral arteries:Vertebral arteries are equal in size. Both vertebral arteries are patent to the basilar. Atherosclerotic plaque in the distal vertebral artery causing mild stenosis bilaterally.  Skeleton: Negative  Other neck: Negative for mass or adenopathy in the neck. Lung apices are clear.  IMPRESSION: 25% diameter stenosis of the proximal right  internal carotid artery. Irregular plaque with possible ulceration. Potential source of emboli  Mild atherosclerotic disease of the proximal left internal carotid artery without significant stenosis.  Mild stenosis distal vertebral artery bilaterally secondary to atherosclerotic disease.   Electronically Signed   By: Marlan Palau M.D.   On: 11/19/2014 09:52   Ct Chest W Contrast  11/19/2014   CLINICAL DATA:  Recurrent strokes. Evaluation for underlying malignancy.  EXAM: CT CHEST, ABDOMEN, AND PELVIS WITH CONTRAST  TECHNIQUE: Multidetector CT imaging of the chest, abdomen and pelvis was performed following the standard protocol during bolus administration of intravenous contrast.  CONTRAST:  OMNIPAQUE IOHEXOL 350 MG/ML SOLN  COMPARISON:  Chest radiograph 11/17/2010. CT abdomen and pelvis 03/10/2010.  FINDINGS: CT CHEST FINDINGS  No enlarged axillary, mediastinal, or hilar lymph nodes are identified. Mild-to-moderate LAD coronary artery calcification is noted. The heart appears mildly enlarged. There is mild to moderate thoracic aortic calcification.  There is no pleural or pericardial effusion. Evaluation of the lung parenchyma is mildly limited by motion artifact in  the bases. No consolidation or nodules are identified. Major airways appear patent. Old right second through sixth rib fractures and an old left seventh rib fracture are noted.  CT ABDOMEN AND PELVIS FINDINGS  9 mm hypodensity in hepatic segment IVa is unchanged and most likely represents a cyst or biliary hamartoma. A calcification in the right hepatic lobe is unchanged. The gallbladder is surgically absent. No biliary dilatation is seen. The spleen, adrenal glands, and pancreas are unremarkable. The kidneys are lobular in contour with suggestion of multiple areas of scarring. There are numerous small hypodensities throughout both kidneys, the majority of which are subcentimeter in size and too small to characterize although most likely represent cysts.  There is no evidence of bowel obstruction. No bowel wall thickening is identified. Appendix is unremarkable. The uterus is absent. Bladder is grossly unremarkable. No pelvic mass is seen. Multiple pelvic phleboliths are again noted.  Moderate aortoiliac atherosclerosis is present. No free fluid or enlarged lymph nodes are identified. Skin thickening and subcutaneous stranding and gas in the left lower is likely reflective of subcutaneous medication administration. Sclerotic focus in the left ilium is unchanged, likely a bone island. Mild multilevel lumbar disc degeneration is noted, with vacuum disc L1-2.  IMPRESSION: No evidence of malignancy in the chest, abdomen, or pelvis.   Electronically Signed   By: Sebastian Ache   On: 11/19/2014 09:53   Ct Abdomen Pelvis W Contrast  11/19/2014   CLINICAL DATA:  Recurrent strokes. Evaluation for underlying malignancy.  EXAM: CT CHEST, ABDOMEN, AND PELVIS WITH CONTRAST  TECHNIQUE: Multidetector CT imaging of the chest, abdomen and pelvis was performed following the standard protocol during bolus administration of intravenous contrast.  CONTRAST:  OMNIPAQUE IOHEXOL 350 MG/ML SOLN  COMPARISON:  Chest radiograph  11/17/2010. CT abdomen and pelvis 03/10/2010.  FINDINGS: CT CHEST FINDINGS  No enlarged axillary, mediastinal, or hilar lymph nodes are identified. Mild-to-moderate LAD coronary artery calcification is noted. The heart appears mildly enlarged. There is mild to moderate thoracic aortic calcification.  There is no pleural or pericardial effusion. Evaluation of the lung parenchyma is mildly limited by motion artifact in the bases. No consolidation or nodules are identified. Major airways appear patent. Old right second through sixth rib fractures and an old left seventh rib fracture are noted.  CT ABDOMEN AND PELVIS FINDINGS  9 mm hypodensity in hepatic segment IVa is unchanged and most likely represents a cyst or biliary  hamartoma. A calcification in the right hepatic lobe is unchanged. The gallbladder is surgically absent. No biliary dilatation is seen. The spleen, adrenal glands, and pancreas are unremarkable. The kidneys are lobular in contour with suggestion of multiple areas of scarring. There are numerous small hypodensities throughout both kidneys, the majority of which are subcentimeter in size and too small to characterize although most likely represent cysts.  There is no evidence of bowel obstruction. No bowel wall thickening is identified. Appendix is unremarkable. The uterus is absent. Bladder is grossly unremarkable. No pelvic mass is seen. Multiple pelvic phleboliths are again noted.  Moderate aortoiliac atherosclerosis is present. No free fluid or enlarged lymph nodes are identified. Skin thickening and subcutaneous stranding and gas in the left lower is likely reflective of subcutaneous medication administration. Sclerotic focus in the left ilium is unchanged, likely a bone island. Mild multilevel lumbar disc degeneration is noted, with vacuum disc L1-2.  IMPRESSION: No evidence of malignancy in the chest, abdomen, or pelvis.   Electronically Signed   By: Sebastian Ache   On: 11/19/2014 09:53     Scheduled Meds: . aspirin EC  81 mg Oral Daily  . atorvastatin  20 mg Oral q1800  . ciprofloxacin  500 mg Oral BID  . clopidogrel  75 mg Oral Daily  . coumadin book   Does not apply Once  . cyanocobalamin  1,000 mcg Intramuscular Q30 days  . furosemide  60 mg Oral Daily  . gabapentin  300 mg Oral QHS  . heparin  5,000 Units Subcutaneous 3 times per day  . insulin aspart  0-15 Units Subcutaneous TID WC  . insulin aspart  0-5 Units Subcutaneous QHS  . insulin aspart  12 Units Subcutaneous TID WC  . insulin glargine  60 Units Subcutaneous QHS  . levothyroxine  125 mcg Oral Daily  . LORazepam  1 mg Oral BID  . losartan  100 mg Oral Daily  . metoprolol tartrate  12.5 mg Oral BID  . pantoprazole  40 mg Oral Daily  . PARoxetine  20 mg Oral Daily  . potassium chloride SA  20 mEq Oral Daily  . sodium chloride  3 mL Intravenous Q12H  . warfarin  5 mg Oral ONCE-1800  . warfarin   Does not apply Once  . Warfarin - Pharmacist Dosing Inpatient   Does not apply q1800   Continuous Infusions: . sodium chloride 75 mL/hr at 11/20/14 1343    Principal Problem:   Left-sided weakness Active Problems:   Fibromyalgia   Diabetes mellitus   Obesity   Stroke   History of recent stroke   Accelerated hypertension   Acute cystitis without hematuria   Tachycardia    Time spent: 25 minutes    Anika Shore  [facility] Pager [phone] If 7PM-7AM, please contact night-coverage at [email or web address], password [email or web address] 11/20/2014, 4:27 PM  LOS: 2 days

## 2014-11-20 NOTE — Progress Notes (Signed)
ANTICOAGULATION CONSULT NOTE - Initial Consult  Pharmacy Consult for Warfarin Indication: Stroke like symptoms - irregular plaque  Allergies  Allergen Reactions  . Ivp Dye [Iodinated Diagnostic Agents]     Per pt: swelling with first contrast xray in 1969. Second contrast xray in the 90s without symptoms  . Penicillins Swelling and Rash    Patient Measurements: Height: 5' (152.4 cm) Weight: 212 lb 8 oz (96.389 kg) IBW/kg (Calculated) : 45.5  Vital Signs: Temp: 98.6 F (37 C) (04/02 1335) Temp Source: Oral (04/02 1335) BP: 117/60 mmHg (04/02 1335) Pulse Rate: 125 (04/02 1335)  Labs:  Recent Labs  11/18/14 0629 11/19/14 0558 11/20/14 0530  HGB 14.0 14.2 13.3  HCT 42.3 42.1 40.7  PLT 195 245 246  CREATININE 1.04 1.09 1.12*    Estimated Creatinine Clearance: 52.8 mL/min (by C-G formula based on Cr of 1.12).   Medical History: Past Medical History  Diagnosis Date  . Fibromyalgia   . Diabetes mellitus   . Hypertension   . Dyslipidemia   . TIA (transient ischemic attack)   . Depression with anxiety   . CVA (cerebral infarction)     Medications:  Scheduled:  . aspirin EC  81 mg Oral Daily  . atorvastatin  20 mg Oral q1800  . ciprofloxacin  500 mg Oral BID  . clopidogrel  75 mg Oral Daily  . cyanocobalamin  1,000 mcg Intramuscular Q30 days  . furosemide  60 mg Oral Daily  . gabapentin  300 mg Oral QHS  . heparin  5,000 Units Subcutaneous 3 times per day  . insulin aspart  0-15 Units Subcutaneous TID WC  . insulin aspart  0-5 Units Subcutaneous QHS  . insulin aspart  12 Units Subcutaneous TID WC  . insulin glargine  60 Units Subcutaneous QHS  . levothyroxine  125 mcg Oral Daily  . LORazepam  1 mg Oral BID  . losartan  100 mg Oral Daily  . metoprolol tartrate  12.5 mg Oral BID  . pantoprazole  40 mg Oral Daily  . PARoxetine  20 mg Oral Daily  . potassium chloride SA  20 mEq Oral Daily  . sodium chloride  3 mL Intravenous Q12H    Assessment: Pt.  presents with symptoms similar to stroke and we have been asked to start oral anticoagulation with Warfarin.  There was discussion regarding starting now or await f/u with Dr. Pearlean BrownieSethi at a later date.  I discussed this with Dr. Blake DivineAkula and she states that Dr. Roda ShuttersXu and she have agreed that it would be best to start at this time.  Her CBC is stable at 13.3/40.7 and her platelets are 246K.  She has no noted bleeding complications.  Her baseline PT/INR was 13.7/1.04 drawn on 11/17/2014.  Her aPTT was 31 sec.  Drug/Drug Interactions: 1.  Aspirin + Warfarin  2.  Clopidogrel + Warfarin 3.  Cipro + Warfarin 4.  Levothyroxine + Warfarin  Goal of Therapy:  INR 2-3 Monitor platelets by anticoagulation protocol: Yes   Plan:  - Warfarin 5mg  PO x 1 tonight - Daily PT/INR - CBC weekly  - Warfarin teaching booklet - Warfarin video  Nadara MustardNita Kayann Maj, PharmD., MS Clinical Pharmacist Pager:  [phone] Thank you for allowing pharmacy to be part of this patients care team. 11/20/2014,4:06 PM

## 2014-11-20 NOTE — Progress Notes (Signed)
STROKE TEAM PROGRESS NOTE   HISTORY Dawn Ho is an 64 y.o. female who reports that last evening she noted numbness on her left and blurred vision when she awakened from her sleep at 2300 (LKW 11/16/2014 at 2100). She went back to sleep and when she awakened this morning the staff noted she was dragging her left leg. She also had a facial droop and had noted continued left sided numbness. EMS was called at that time and the patient was brought in as a code stroke. Patient was not administered TPA secondary to  outside treatment window, recent CVA. She was admitted for further evaluation and treatment.   SUBJECTIVE (INTERVAL HISTORY) Multiple family members present. The patient is without complaints. The family plans to discuss treatment options including possible anticoagulation.   OBJECTIVE Temp:  [98.2 F (36.8 C)-99 F (37.2 C)] 98.6 F (37 C) (04/02 1335) Pulse Rate:  [98-125] 125 (04/02 1335) Cardiac Rhythm:  [-] Sinus tachycardia (04/01 2100) Resp:  [18-20] 18 (04/02 1335) BP: (117-175)/(59-89) 117/60 mmHg (04/02 1335) SpO2:  [91 %-96 %] 91 % (04/02 1335)   Recent Labs Lab 11/19/14 1058 11/19/14 1630 11/19/14 2155 11/20/14 0652 11/20/14 1155  GLUCAP 364* 380* 245* 187* 206*    Recent Labs Lab 11/17/14 0928 11/17/14 0935 11/18/14 0629 11/19/14 0558 11/20/14 0530  NA 142 142 141 140 140  K 3.7 3.9 3.7 4.5 3.6  CL 107 105 104 104 106  CO2 28  --  _0 GLUCOSE 184* 187* 163* 293* 196*  BUN _1 CREATININE 1.05 0.90 1.04 1.09 1.12*  CALCIUM 9.4  --  9.5 9.8 9.5    Recent Labs Lab 11/17/14 0928  AST 24  ALT 19  ALKPHOS 81  BILITOT 0.6  PROT 6.9  ALBUMIN 3.3*    Recent Labs Lab 11/17/14 0928 11/17/14 0935 11/18/14 0629 11/19/14 0558 11/20/14 0530  WBC 9.9  --  9.7 13.3* 16.6*  NEUTROABS 6.8  --   --   --   --   HGB 13.4 14.6 14.0 14.2 13.3  HCT 40.4 43.0 42.3 42.1 40.7  MCV 94.0  --  94.2 93.1 92.7  PLT 203  --  195 245 246    No results for input(s): CKTOTAL, CKMB, CKMBINDEX, TROPONINI in the last 168 hours. No results for input(s): LABPROT, INR in the last 72 hours. No results for input(s): COLORURINE, LABSPEC, [place], GLUCOSEU, HGBUR, BILIRUBINUR, KETONESUR, PROTEINUR, UROBILINOGEN, NITRITE, LEUKOCYTESUR in the last 72 hours.  Invalid input(s): APPERANCEUR     Component Value Date/Time   CHOL 207* 10/14/2014 0419   TRIG 358* 10/14/2014 0419   HDL 42 10/14/2014 0419   CHOLHDL 4.9 10/14/2014 0419   VLDL 72* 10/14/2014 0419   LDLCALC 93 10/14/2014 0419   Lab Results  Component Value Date   HGBA1C 8.4* 11/17/2014      Component Value Date/Time   LABOPIA NONE DETECTED 11/17/2014 1100   COCAINSCRNUR NONE DETECTED 11/17/2014 1100   LABBENZ POSITIVE* 11/17/2014 1100   AMPHETMU NONE DETECTED 11/17/2014 1100   THCU NONE DETECTED 11/17/2014 1100   LABBARB NONE DETECTED 11/17/2014 1100     Recent Labs Lab 11/17/14 0928  ETH <5   I have personally reviewed the radiological images below and agree with the radiology interpretations.  Ct Head Wo Contrast 11/17/2014    Chronic ischemic changes are stable.  No acute abnormality.    10/13/2014  Progressed chronic small vessel ischemia since 2012. No  acute cortically based infarct or acute intracranial hemorrhage identified.  Mr Brain Wo Contrast 11/17/2014   1. Recurrent acute small bilateral cerebral white matter infarcts, maximal today in the right corona radiata. No acute hemorrhage or mass effect. Synchronous small vessel ischemia versus recent embolic event are the top differential considerations. 2. Underlying severe chronic small vessel disease.    Mr Jodene Nam Head Wo Contrast 10/13/2014  1. Multiple punctate acute/subacute infarcts of varying ages compatible with a history of symptoms over the last 3 weeks. The largest cluster of infarcts is in the posterior right frontal lobe, compatible with left-sided numbness and weakness.  2. Other more  remote infarcts are present within the left greater than right basal ganglia and thalami as well as within the deep white matter.  3. Extensive small vessel white matter changes are present bilaterally.  4. Extensive branch vessel stenoses and small vessel disease in both the anterior and posterior circulation.  5. Prominent proximal stenoses in M2 branches bilaterally.  6. No significant stenosis of the medium or large intracranial vessels proximal to the MCA bifurcations.  2D echo 10/14/14 - Left ventricle: The cavity size was normal. Systolic function was normal. The estimated ejection fraction was in the range of 55% to 60%. Wall motion was normal; there were no regional wall motion abnormalities. Features are consistent with a pseudonormal left ventricular filling pattern, with concomitant abnormal relaxation and increased filling pressure (grade 2 diastolic dysfunction).  TEE 10/15/14 - LA, LAA without masses No PFO by color doppler or with injection of agitated saline MV is normal Mild to mod MR TV normal AV mildly thickened. Trace AI PV grossly normal LVEF normal Mild fixed plaque in thoracic aorta   CUS 10/14/14 - Bilateral: intimal wall thickening CCA. Mild mixed plaque origin ICA. 1-39% ICA stenosis. Vertebral artery flow is antegrade.  CTA Neck 11/19/2014 25% diameter stenosis of the proximal right internal carotid artery. Irregular plaque with possible ulceration. Potential source of emboli Mild atherosclerotic disease of the proximal left internal carotid artery without significant stenosis. Mild stenosis distal vertebral artery bilaterally secondary to atherosclerotic disease.  CT Chest, Abdomen, and Pelvis with contrast 11/19/2014 No evidence of malignancy in the chest, abdomen, or pelvis.  PHYSICAL EXAM  Temp:  [98.2 F (36.8 C)-99 F (37.2 C)] 98.6 F (37 C) (04/02 1335) Pulse Rate:  [98-125] 125 (04/02 1335) Resp:  [18-20] 18 (04/02  1335) BP: (117-175)/(59-89) 117/60 mmHg (04/02 1335) SpO2:  [91 %-96 %] 91 % (04/02 1335)  General - morbid obesity, well developed, mildly lethargic, no acute distress.  Ophthalmologic - none not visualized due to small pupils.  Cardiovascular - regular rhythm, tachycardia.  Mental Status -  Mild lethargy, orientation to time, place, and person were intact. Language including expression, naming, repetition, comprehension was assessed and found intact. Fund of Knowledge was assessed and was impaired, does not no current and previous presidents.  Cranial Nerves II - XII - II - Visual field intact OU. III, IV, VI - Extraocular movements intact. V - Facial sensation intact bilaterally. VII - right nasolabial fold flattening. VIII - Hearing & vestibular intact bilaterally. X - Palate elevates symmetrically. XI - Chin turning & shoulder shrug intact bilaterally. XII - Tongue protrusion intact.  Motor Strength - The patient's strength was 3/5 LUE and 4+/5 LLE, and 5-/5 RUE and RLE.  Bulk was normal and fasciculations were absent.   Motor Tone - Muscle tone was assessed at the neck and appendages and was normal.  Reflexes -  The patient's reflexes were 1+ in all extremities and she had no pathological reflexes.  Sensory - Light touch, temperature/pinprick were assessed and were symmetrical.    Coordination - The patient had normal movements in the right hand with no ataxia or dysmetria.  Tremor was absent.  Gait and Station - not tested due to safety concerns and weakness and lethargy.   ASSESSMENT/PLAN Ms. TOSHIKO KEMLER is a 64 y.o. female with long history of stroke with residual L weakness, obesity, fibromyalgia, hypertension and dyslipidemia in setting of recent bilateral ischemic infarcts (largest in R frontal lobe) presenting with blurry vision and worsening L hemiparesis. Patient was currently in SNF for rehab. . She did not receive IV t-PA due to recent stroke.   Recurrent  embolic stroke:  New multiple scattered patchy bilateral infarcts, largest in the R corona radiata in setting of bilateral infarcts one month ago, all infarcts felt to be embolic secondary to unknown source. Her CTA neck showed right ICA potential ulcerated plaques, however, she has bilateral infarcts which not able to explained solely by ulcerated plaques and she is already on dual antiplatelets. No clear etiology found so far.  Resultant  L hemiparesis  MRI  scattered bilateral infarcts, largest in R corona radiata  Loop placed 10/16/2014, interrogated this admission. No atrial fibrillation found.  Past workup:  MRA Extensive anterior and posterior branch vessel stenoses and small vessel disease. proximal B M2 stenoses  Carotid Doppler -1-39% ICA stenosis. Vertebral artery flow is antegrade.  2D Echo - EF 55-60%. No cardiac source of emboli identified.  TEE - no PFO or evidence of thrombus   CT of chest/abd/pelvis with contrast - no evidence of malignancy.  CTA Neck - irregular plaque with possible ulceration R ICA.   Hypercoagulable and autoimmune workup as below  C3 complement - 177 (H)  C4 complement - 39 (WNL)  Total complement - > 60 (H)  HIV - NR  RPR - NR  ESR - 40 (H)  Others pending  Heparin 5000 units sq tid for VTE prophylaxis Diet Carb Modified Fluid consistency:: Thin; Room service appropriate?: Yes  aspirin 81 mg orally every day and clopidogrel 75 mg orally every day prior to admission, now on aspirin 81 mg orally every day and clopidogrel 75 mg orally every day. Pt family now would like to start anticoaugation with coumadin. No need bridge. Once INR 2-3, stop ASA and plavix.   Ongoing aggressive stroke risk factor management  Therapy recommendations:  SNF with 24/7 supervision / asistance.  Disposition:  Anticipate return to SNF  F/u 01/12/15 with Dr.Sethi as scheduled. Discussed with patient, daughter and husband at bedside, regarding treatment plan  either treatment with Coumadin, continue observation and the follow-up loop recorder, or follow-up with Dr. Leonie Man in May for further decision. Patient, daughter and husband now unanimously decided to go ahead with coumadin. I have discussed the risk and benefit of coumadin with pt and her family. They expressed understanding.   Tachycardia  Heart rate 120s  Patient on metoprolol 100 mg daily prior to admission - has not been restarted.  Consider restarting metoprolol 50 mg daily ( 6 beat run of V. tach today according to nursing )  Sinus rhythm  unclear etiology  Mainly related to hyperglycemia, dehydration or UTI/urosepsis  Manage per primary team  UTI  UA WBC 21-50  On by mouth Cipro twice a day  WBC 9.7->13.3->16.6   Diabetes  HgbA1c 8.4, goal < 7.0  Uncontrolled  On  Lantus  SSI  CBG monitoring   DM education  Encouraged weight loss  Manage per primary team  Hypertension  Home meds:   Toprol, cozaar, lasix Permissive hypertension (OK if <220/120) for 24-48 hours post stroke and then gradually normalized within 5-7 days. On losartan  Stable  Hyperlipidemia  Home meds:  liptor 10, resumed in hospital  LDL 93 on 10/14/2014, goal < 70  Continue statin at discharge  Morbid obesity  Encouraged weight loss  Healthy diet   Body mass index is 41.5 kg/(m^2).   Other Stroke Risk Factors  Hx stroke/TIA  Hospital day # 1  RINEHULS, [place] for Pager information 11/20/2014 3:02 PM   I, the attending vascular neurologist, have personally obtained a history, examined the patient, evaluated laboratory data, individually viewed imaging studies and agree with radiology interpretations. I also obtained additional history from pt's husband at bedside. Together with the NP/PA, we formulated the assessment and plan of care which reflects our mutual decision.  I have made any additions or clarifications directly to the above  note and agree with the findings and plan as currently documented.   64 year old female with history of hypertension, hyperlipidemia, previous CVA was admitted again for recurrent embolic strokes. MRI showed multiple patchy small infarcts bilaterally, consistent with embolic stroke. On last admission one month ago, she had stroke workup for embolic stroke, TEE negative, no PFO, loop recorder placed. On this admission no precordial interrogated, no A. Fib. Further stroke workup including CTA neck showed no significant stenosis but right ICA possible ulcerated plaque, and pan CT ruled out a malignancy, and pending hypercoagulable as well as autoimmune workup. In terms of treatment, either starting Coumadin, or continue observation follow-up with loop recorder, or further decision making with Dr. Leonie Man on 01/12/2015 follow-up. Discussed with patient, daughter and husband at bedside, they would like to start anticoagulation with coumadin. Continue aggressive risk factor control. PT/OT.   Neurology will sign off. Please call with questions. Pt will follow up with Dr. Leonie Man on 01/12/15 as scheduled. Thanks for the consult.  Rosalin Hawking, MD PhD Stroke Neurology 11/20/2014 10:20 PM   To contact Stroke Continuity provider, please refer to [email or web address] After hours, contact General Neurology

## 2014-11-21 LAB — CBC
HEMATOCRIT: 38.1 % (ref 36.0–46.0)
Hemoglobin: 12.7 g/dL (ref 12.0–15.0)
MCH: 31.1 pg (ref 26.0–34.0)
MCHC: 33.3 g/dL (ref 30.0–36.0)
MCV: 93.2 fL (ref 78.0–100.0)
PLATELETS: 235 10*3/uL (ref 150–400)
RBC: 4.09 MIL/uL (ref 3.87–5.11)
RDW: 14.1 % (ref 11.5–15.5)
WBC: 12.4 10*3/uL — ABNORMAL HIGH (ref 4.0–10.5)

## 2014-11-21 LAB — GLUCOSE, CAPILLARY
GLUCOSE-CAPILLARY: 245 mg/dL — AB (ref 70–99)
Glucose-Capillary: 229 mg/dL — ABNORMAL HIGH (ref 70–99)
Glucose-Capillary: 243 mg/dL — ABNORMAL HIGH (ref 70–99)
Glucose-Capillary: 151 mg/dL — ABNORMAL HIGH (ref 70–99)

## 2014-11-21 LAB — CARDIOLIPIN ANTIBODIES, IGG, IGM, IGA
Anticardiolipin IgA: <9 APL U/mL (ref 0–11)
Anticardiolipin IgM: 9 MPL U/mL (ref 0–12)

## 2014-11-21 LAB — BASIC METABOLIC PANEL
Anion gap: 12 (ref 5–15)
BUN: 18 mg/dL (ref 6–23)
CO2: 22 mmol/L (ref 19–32)
Calcium: 9.1 mg/dL (ref 8.4–10.5)
Chloride: 108 mmol/L (ref 96–112)
Creatinine, Ser: 0.99 mg/dL (ref 0.50–1.10)
GFR calc non Af Amer: 59 mL/min — ABNORMAL LOW (ref >90)
GFR, EST AFRICAN AMERICAN: 68 mL/min — AB (ref >90)
GLUCOSE: 244 mg/dL — AB (ref 70–99)
POTASSIUM: 3.6 mmol/L (ref 3.5–5.1)
Sodium: 142 mmol/L (ref 135–145)

## 2014-11-21 LAB — TROPONIN I: Troponin I: 0.03 ng/mL (ref <0.031)

## 2014-11-21 MED ORDER — WARFARIN SODIUM 5 MG PO TABS
5.0000 mg | ORAL_TABLET | Freq: Once | ORAL | Status: AC
  Administered 2014-11-21: 5 mg via ORAL
  Filled 2014-11-21: qty 1

## 2014-11-21 MED ORDER — INSULIN GLARGINE 100 UNIT/ML ~~LOC~~ SOLN
65.0000 [IU] | Freq: Every day | SUBCUTANEOUS | Status: DC
Start: 2014-11-21 — End: 2014-11-22
  Administered 2014-11-21: 65 [IU] via SUBCUTANEOUS
  Filled 2014-11-21 (×3): qty 0.65

## 2014-11-21 MED ORDER — INSULIN ASPART 100 UNIT/ML ~~LOC~~ SOLN
0.0000 [IU] | Freq: Three times a day (TID) | SUBCUTANEOUS | Status: DC
  Administered 2014-11-21: 7 [IU] via SUBCUTANEOUS
  Administered 2014-11-22: 4 [IU] via SUBCUTANEOUS
  Administered 2014-11-22: 3 [IU] via SUBCUTANEOUS

## 2014-11-21 NOTE — Progress Notes (Signed)
ANTICOAGULATION CONSULT NOTE - Follow Up  Pharmacy Consult for Warfarin Indication: Stroke like symptoms - irregular plaque  Allergies  Allergen Reactions  . Ivp Dye [Iodinated Diagnostic Agents]     Per pt: swelling with first contrast xray in 1969. Second contrast xray in the 90s without symptoms  . Penicillins Swelling and Rash   Patient Measurements: Height: 5' (152.4 cm) Weight: 212 lb 8 oz (96.389 kg) IBW/kg (Calculated) : 45.5  Vital Signs: Temp: 97.8 F (36.6 C) (04/03 0652) Temp Source: Oral (04/03 0652) BP: 172/94 mmHg (04/03 0652) Pulse Rate: 69 (04/03 0652)  Labs:  Recent Labs  11/19/14 0558 11/20/14 0530 11/20/14 1626 11/20/14 2123 11/21/14 0427  HGB 14.2 13.3  --   --  12.7  HCT 42.1 40.7  --   --  38.1  PLT 245 246  --   --  235  LABPROT  --   --  14.1  --   --   INR  --   --  1.08  --   --   CREATININE 1.09 1.12*  --   --  0.99  TROPONINI  --   --  0.03 <0.03 0.03   Estimated Creatinine Clearance: 59.7 mL/min (by C-G formula based on Cr of 0.99).  Medical History: Past Medical History  Diagnosis Date  . Fibromyalgia   . Diabetes mellitus   . Hypertension   . Dyslipidemia   . TIA (transient ischemic attack)   . Depression with anxiety   . CVA (cerebral infarction)    Medications:  Scheduled:  . aspirin EC  81 mg Oral Daily  . atorvastatin  20 mg Oral q1800  . ciprofloxacin  500 mg Oral BID  . clopidogrel  75 mg Oral Daily  . cyanocobalamin  1,000 mcg Intramuscular Q30 days  . furosemide  60 mg Oral Daily  . gabapentin  300 mg Oral QHS  . heparin  5,000 Units Subcutaneous 3 times per day  . insulin aspart  0-15 Units Subcutaneous TID WC  . insulin aspart  0-5 Units Subcutaneous QHS  . insulin aspart  12 Units Subcutaneous TID WC  . insulin glargine  60 Units Subcutaneous QHS  . levothyroxine  125 mcg Oral Daily  . LORazepam  1 mg Oral BID  . losartan  100 mg Oral Daily  . metoprolol tartrate  12.5 mg Oral BID  . pantoprazole  40 mg  Oral Daily  . PARoxetine  20 mg Oral Daily  . potassium chloride SA  20 mEq Oral Daily  . sodium chloride  3 mL Intravenous Q12H  . warfarin  5 mg Oral ONCE-1800  . warfarin   Does not apply Once  . Warfarin - Pharmacist Dosing Inpatient   Does not apply q1800   Assessment: Pt. presents with symptoms similar to stroke and we have been asked to start oral anticoagulation with Warfarin.  She received her first dose of Warfarin last evening with 5mg .   She has no noted bleeding complications.  Her baseline PT/INR was 13.7/1.04 drawn on 11/17/2014.  Her aPTT was 31 sec.  Noted plans to discontinue her Clopidogrel after therapeutic INR.  Drug/Drug Interactions: 1.  Aspirin + Warfarin  2.  Clopidogrel + Warfarin - noted plans 3.  Cipro + Warfarin - (Cipro - Day 2 for E.Coli UTI - consider 3-5 days max therapy) 4.  Levothyroxine + Warfarin  Goal of Therapy:  INR 2-3 Monitor platelets by anticoagulation protocol: Yes   Plan:  - Warfarin 5mg   PO x 1 tonight - Daily PT/INR - CBC weekly  - Please consider adding stop date for Cipro   Nadara Mustard, PharmD., MS Clinical Pharmacist Pager:  [phone] Thank you for allowing pharmacy to be part of this patients care team. 11/21/2014,9:06 AM

## 2014-11-21 NOTE — Progress Notes (Signed)
[facility] PROGRESS NOTE  Dawn Ho XBM:[id number] DOB: [birth year] DOA: 11/17/2014 PCP: Frederica Kuster, MD Interim summary. 64 y.o lady admitted for numbness of the left side and blurred vision and left sided weakness. She was recently discharged in feb for stroke. MRI of the brain revealed new multiple areas of acute and sub acute infarcts in the right frontal lobe. Neurology consulted and recommendations given.  Assessment/Plan: 1. Recurrent stroke: Admitted to telemetry and neurology consulted. Recent echo and TEE done in 2/26. She underwent loop recorder placement on 2/27 interrogated this admission and no cardiac arrythmias found. Hypercoagulable work up ordered and is negative. Resume aspirin and plavix. CT abdomen, chest and pelvis did not reveal any malignancy. CTA of the neck revealed 25% stenosis or the  Proximal right ICA with irregular plaque with possible ulceration . This could be a potential source of emboli. There is mild atherosclerotic disease on the left proximal ICA with out significant stenosis.   Therapy evals show SNF placement when bed available. Marland Kitchen LDL is 93 a month ago. Continue statin.  hgba1c is 8.4, increased the dose of lantus and change to mod SSI.  Currently she is on aspirin 81 mg and plavix of 75mg , added coumadin for anticoagulation for the irregular plague. When iNR is greater than two , she can be safely taken off the plavix and aspirin.  Plan for SNF on discharge probably tomorrow.    Hypertension: Better controlled.  Resume home meds.    Diabetes Mellitus: hgba1c is 8.4. CBG (last 3)   Recent Labs  11/20/14 2205 11/21/14 0640 11/21/14 1158  GLUCAP 239* 229* 245*    Increase the dose of lantus to 65 units at bedtime, change to resistant scale SSI.  Added novlog  TIDAC.     Morbid obesity.   UTI: Cultures sent and show 1,00,000 E coli and sensitive to ciprofloxacin. Changed rocephin to ciprofloxacin.  Continue cipro to complete  the course.   Tachycardia: Sinus, resolved with metoprolol.    Code Status: full code.  Family Communication: discussed with daughter at bedside on 4/2 Disposition Plan: SNF tomorrow.    Consultants:  Neurology.   Procedures:  MRI brain   CT head  Antibiotics:  [place].   HPI/Subjective: Comfortable, confused.   Objective: Filed Vitals:   11/21/14 1400  BP: 153/80  Pulse: 82  Temp: 98.3 F (36.8 C)  Resp: 17   No intake or output data in the 24 hours ending 11/21/14 1649 Filed Weights   11/17/14 0934  Weight: 96.389 kg (212 lb 8 oz)    Exam:   General:  Alert afebrile comfortable  Cardiovascular: s1s2  Respiratory: ctab, no wheezing or rhonchi.   Abdomen: soft non tender non distended bowel sounds heard  Musculoskeletal: no pedal edema.   Data Reviewed: Basic Metabolic Panel:  Recent Labs Lab 11/17/14 0928 11/17/14 0935 11/18/14 0629 11/19/14 0558 11/20/14 0530 11/21/14 0427  NA 142 142 141 140 140 142  K 3.7 3.9 3.7 4.5 3.6 3.6  CL 107 105 104 104 106 108  CO2 28  --  25 28 27 22   GLUCOSE 184* 187* 163* 293* 196* 244*  BUN 12 16 13 16 20 18   CREATININE 1.05 0.90 1.04 1.09 1.12* 0.99  CALCIUM 9.4  --  9.5 9.8 9.5 9.1   Liver Function Tests:  Recent Labs Lab 11/17/14 0928  AST 24  ALT 19  ALKPHOS 81  BILITOT 0.6  PROT 6.9  ALBUMIN 3.3*   No  results for input(s): LIPASE, AMYLASE in the last 168 hours. No results for input(s): AMMONIA in the last 168 hours. CBC:  Recent Labs Lab 11/17/14 0928 11/17/14 0935 11/18/14 0629 11/19/14 0558 11/20/14 0530 11/21/14 0427  WBC 9.9  --  9.7 13.3* 16.6* 12.4*  NEUTROABS 6.8  --   --   --   --   --   HGB 13.4 14.6 14.0 14.2 13.3 12.7  HCT 40.4 43.0 42.3 42.1 40.7 38.1  MCV 94.0  --  94.2 93.1 92.7 93.2  PLT 203  --  195 245 246 235   Cardiac Enzymes:  Recent Labs Lab 11/20/14 1626 11/20/14 2123 11/21/14 0427  TROPONINI 0.03 <0.03 0.03   BNP (last 3 results) No  results for input(s): BNP in the last 8760 hours.  ProBNP (last 3 results) No results for input(s): PROBNP in the last 8760 hours.  CBG:  Recent Labs Lab 11/20/14 1155 11/20/14 1721 11/20/14 2205 11/21/14 0640 11/21/14 1158  GLUCAP 206* 288* 239* 229* 245*    Recent Results (from the past 240 hour(s))  Urine culture     Status: None   Collection Time: 11/17/14 12:19 PM  Result Value Ref Range Status   Specimen Description URINE, CATHETERIZED  Final   Special Requests NONE  Final   Colony Count   Final    >=100,000 COLONIES/ML Performed at [facility]    Culture   Final    ESCHERICHIA COLI Performed at [facility]    Report Status 11/19/2014 FINAL  Final   Organism ID, Bacteria ESCHERICHIA COLI  Final      Susceptibility   Escherichia coli - MIC*    AMPICILLIN 4 SENSITIVE Sensitive     CEFAZOLIN <=4 SENSITIVE Sensitive     CEFTRIAXONE <=1 SENSITIVE Sensitive     CIPROFLOXACIN <=0.25 SENSITIVE Sensitive     GENTAMICIN <=1 SENSITIVE Sensitive     LEVOFLOXACIN <=0.12 SENSITIVE Sensitive     NITROFURANTOIN <=16 SENSITIVE Sensitive     TOBRAMYCIN <=1 SENSITIVE Sensitive     TRIMETH/SULFA <=20 SENSITIVE Sensitive     PIP/TAZO <=4 SENSITIVE Sensitive     * ESCHERICHIA COLI     Studies: No results found.  Scheduled Meds: . aspirin EC  81 mg Oral Daily  . atorvastatin  20 mg Oral q1800  . ciprofloxacin  500 mg Oral BID  . clopidogrel  75 mg Oral Daily  . cyanocobalamin  1,000 mcg Intramuscular Q30 days  . furosemide  60 mg Oral Daily  . gabapentin  300 mg Oral QHS  . insulin aspart  0-15 Units Subcutaneous TID WC  . insulin aspart  0-5 Units Subcutaneous QHS  . insulin aspart  12 Units Subcutaneous TID WC  . insulin glargine  60 Units Subcutaneous QHS  . levothyroxine  125 mcg Oral Daily  . LORazepam  1 mg Oral BID  . losartan  100 mg Oral Daily  . metoprolol tartrate  12.5 mg Oral BID  . pantoprazole  40 mg Oral Daily  . PARoxetine  20 mg  Oral Daily  . potassium chloride SA  20 mEq Oral Daily  . sodium chloride  3 mL Intravenous Q12H  . warfarin  5 mg Oral ONCE-1800  . warfarin   Does not apply Once  . Warfarin - Pharmacist Dosing Inpatient   Does not apply q1800   Continuous Infusions: . sodium chloride 75 mL/hr at 11/20/14 1343    Principal Problem:   Left-sided weakness Active Problems:  Fibromyalgia   Diabetes mellitus   Obesity   Stroke   History of recent stroke   Accelerated hypertension   Acute cystitis without hematuria   Tachycardia   HLD (hyperlipidemia)    Time spent: 25 minutes    Kinslie Hove  [facility] Pager [phone] If 7PM-7AM, please contact night-coverage at [email or web address], password [facility] 11/21/2014, 4:49 PM  LOS: 3 days

## 2014-11-22 DIAGNOSIS — Z7901 Long term (current) use of anticoagulants: Secondary | ICD-10-CM | POA: Diagnosis not present

## 2014-11-22 DIAGNOSIS — E11649 Type 2 diabetes mellitus with hypoglycemia without coma: Secondary | ICD-10-CM | POA: Diagnosis not present

## 2014-11-22 DIAGNOSIS — K219 Gastro-esophageal reflux disease without esophagitis: Secondary | ICD-10-CM | POA: Diagnosis not present

## 2014-11-22 DIAGNOSIS — I6931 Cognitive deficits following cerebral infarction: Secondary | ICD-10-CM | POA: Diagnosis not present

## 2014-11-22 DIAGNOSIS — E1169 Type 2 diabetes mellitus with other specified complication: Secondary | ICD-10-CM | POA: Diagnosis not present

## 2014-11-22 DIAGNOSIS — R000 Tachycardia, unspecified: Secondary | ICD-10-CM | POA: Diagnosis not present

## 2014-11-22 DIAGNOSIS — I6789 Other cerebrovascular disease: Secondary | ICD-10-CM | POA: Diagnosis not present

## 2014-11-22 DIAGNOSIS — R262 Difficulty in walking, not elsewhere classified: Secondary | ICD-10-CM | POA: Diagnosis not present

## 2014-11-22 DIAGNOSIS — I6340 Cerebral infarction due to embolism of unspecified cerebral artery: Secondary | ICD-10-CM | POA: Diagnosis not present

## 2014-11-22 DIAGNOSIS — R41841 Cognitive communication deficit: Secondary | ICD-10-CM | POA: Diagnosis not present

## 2014-11-22 DIAGNOSIS — N3000 Acute cystitis without hematuria: Secondary | ICD-10-CM | POA: Diagnosis not present

## 2014-11-22 DIAGNOSIS — I480 Paroxysmal atrial fibrillation: Secondary | ICD-10-CM | POA: Diagnosis not present

## 2014-11-22 DIAGNOSIS — E119 Type 2 diabetes mellitus without complications: Secondary | ICD-10-CM | POA: Diagnosis not present

## 2014-11-22 DIAGNOSIS — M79622 Pain in left upper arm: Secondary | ICD-10-CM | POA: Diagnosis not present

## 2014-11-22 DIAGNOSIS — R4189 Other symptoms and signs involving cognitive functions and awareness: Secondary | ICD-10-CM | POA: Diagnosis not present

## 2014-11-22 DIAGNOSIS — I10 Essential (primary) hypertension: Secondary | ICD-10-CM | POA: Diagnosis not present

## 2014-11-22 DIAGNOSIS — M6281 Muscle weakness (generalized): Secondary | ICD-10-CM | POA: Diagnosis not present

## 2014-11-22 DIAGNOSIS — F419 Anxiety disorder, unspecified: Secondary | ICD-10-CM | POA: Diagnosis not present

## 2014-11-22 DIAGNOSIS — D649 Anemia, unspecified: Secondary | ICD-10-CM | POA: Diagnosis not present

## 2014-11-22 DIAGNOSIS — E1342 Other specified diabetes mellitus with diabetic polyneuropathy: Secondary | ICD-10-CM | POA: Diagnosis not present

## 2014-11-22 DIAGNOSIS — Z5181 Encounter for therapeutic drug level monitoring: Secondary | ICD-10-CM | POA: Diagnosis not present

## 2014-11-22 DIAGNOSIS — E08329 Diabetes mellitus due to underlying condition with mild nonproliferative diabetic retinopathy without macular edema: Secondary | ICD-10-CM | POA: Diagnosis not present

## 2014-11-22 DIAGNOSIS — E669 Obesity, unspecified: Secondary | ICD-10-CM | POA: Diagnosis not present

## 2014-11-22 DIAGNOSIS — Z79899 Other long term (current) drug therapy: Secondary | ICD-10-CM | POA: Diagnosis not present

## 2014-11-22 DIAGNOSIS — M797 Fibromyalgia: Secondary | ICD-10-CM | POA: Diagnosis not present

## 2014-11-22 DIAGNOSIS — E785 Hyperlipidemia, unspecified: Secondary | ICD-10-CM | POA: Diagnosis not present

## 2014-11-22 DIAGNOSIS — M6289 Other specified disorders of muscle: Secondary | ICD-10-CM | POA: Diagnosis not present

## 2014-11-22 DIAGNOSIS — I639 Cerebral infarction, unspecified: Secondary | ICD-10-CM | POA: Diagnosis not present

## 2014-11-22 DIAGNOSIS — R278 Other lack of coordination: Secondary | ICD-10-CM | POA: Diagnosis not present

## 2014-11-22 DIAGNOSIS — I6990 Unspecified sequelae of unspecified cerebrovascular disease: Secondary | ICD-10-CM | POA: Diagnosis not present

## 2014-11-22 DIAGNOSIS — M79632 Pain in left forearm: Secondary | ICD-10-CM | POA: Diagnosis not present

## 2014-11-22 DIAGNOSIS — F339 Major depressive disorder, recurrent, unspecified: Secondary | ICD-10-CM | POA: Diagnosis not present

## 2014-11-22 DIAGNOSIS — F331 Major depressive disorder, recurrent, moderate: Secondary | ICD-10-CM | POA: Diagnosis not present

## 2014-11-22 LAB — CBC
HEMATOCRIT: 38.5 % (ref 36.0–46.0)
HEMOGLOBIN: 12.7 g/dL (ref 12.0–15.0)
MCH: 30.8 pg (ref 26.0–34.0)
MCHC: 33 g/dL (ref 30.0–36.0)
MCV: 93.4 fL (ref 78.0–100.0)
Platelets: 196 10*3/uL (ref 150–400)
RBC: 4.12 MIL/uL (ref 3.87–5.11)
RDW: 14.1 % (ref 11.5–15.5)
WBC: 9.9 10*3/uL (ref 4.0–10.5)

## 2014-11-22 LAB — BASIC METABOLIC PANEL
Anion gap: 12 (ref 5–15)
BUN: 17 mg/dL (ref 6–23)
CALCIUM: 8.9 mg/dL (ref 8.4–10.5)
CO2: 23 mmol/L (ref 19–32)
CREATININE: 0.87 mg/dL (ref 0.50–1.10)
Chloride: 108 mmol/L (ref 96–112)
GFR calc Af Amer: 80 mL/min — ABNORMAL LOW (ref >90)
GFR calc non Af Amer: 69 mL/min — ABNORMAL LOW (ref >90)
GLUCOSE: 153 mg/dL — AB (ref 70–99)
POTASSIUM: 3.5 mmol/L (ref 3.5–5.1)
Sodium: 143 mmol/L (ref 135–145)

## 2014-11-22 LAB — PROTIME-INR
INR: 1.05 (ref 0.00–1.49)
PROTHROMBIN TIME: 13.8 s (ref 11.6–15.2)

## 2014-11-22 LAB — GLUCOSE, CAPILLARY
Glucose-Capillary: 144 mg/dL — ABNORMAL HIGH (ref 70–99)
Glucose-Capillary: 187 mg/dL — ABNORMAL HIGH (ref 70–99)

## 2014-11-22 LAB — PROTHROMBIN GENE MUTATION

## 2014-11-22 MED ORDER — INSULIN GLARGINE 100 UNIT/ML ~~LOC~~ SOLN: 65.0000 [IU] | Freq: Every day | SUBCUTANEOUS | Status: DC

## 2014-11-22 MED ORDER — WARFARIN SODIUM 7.5 MG PO TABS: 7.5000 mg | ORAL_TABLET | Freq: Every day | ORAL | Status: DC

## 2014-11-22 MED ORDER — WARFARIN SODIUM 7.5 MG PO TABS: 7.5000 mg | ORAL_TABLET | Freq: Once | ORAL | Status: DC

## 2014-11-22 MED ORDER — METOPROLOL SUCCINATE ER 50 MG PO TB24: 50.0000 mg | ORAL_TABLET | Freq: Every day | ORAL | Status: DC

## 2014-11-22 NOTE — Discharge Summary (Addendum)
Physician Discharge Summary  Dawn Ho ZOX:[id number] DOB: 03-Dec-1950 DOA: 11/17/2014  PCP: Frederica Kuster, MD  Admit date: 11/17/2014 Discharge date: 11/22/2014  Time spent: 30 minutes  Recommendations for Outpatient Follow-up:  Follow up with PCP in one week.  Follow up with  Neurology as recommended Please cheeck INR tomorrow and when INR is therapeutic, please stop aspirin and plavix.  Follow up with PT/OT SLP at the facility.   Discharge Diagnoses:  Principal Problem:   Left-sided weakness Active Problems:   Fibromyalgia   Diabetes mellitus   Obesity   Stroke   History of recent stroke   Accelerated hypertension   Acute cystitis without hematuria   Tachycardia   HLD (hyperlipidemia)   Discharge Condition: improved  Diet recommendation: low sodium carb modified diet.   Filed Weights   11/17/14 0934  Weight: 96.389 kg (212 lb 8 oz)    History of present illness:  64 y.o lady admitted for numbness of the left side and blurred vision and left sided weakness. She was recently discharged in feb for stroke. MRI of the brain revealed new multiple areas of acute and sub acute infarcts in the right frontal lobe. Neurology consulted and recommendations given.  Hospital Course:   1. Recurrent stroke: Admitted to telemetry and neurology consulted. Recent echo and TEE done in 2/26. She underwent loop recorder placement on 2/27 interrogated this admission and no cardiac arrythmias found. Hypercoagulable work up ordered and is negative. Resume aspirin and plavix. CT abdomen, chest and pelvis did not reveal any malignancy. CTA of the neck revealed 25% stenosis or the Proximal right ICA with irregular plaque with possible ulceration . This could be a potential source of emboli. There is mild atherosclerotic disease on the left proximal ICA with out significant stenosis.  Therapy evals show SNF placement when bed available. Marland Kitchen LDL is 93 a month ago. Continue statin.  hgba1c is  8.4, increased the dose of lantus and change to mod SSI.  Currently she is on aspirin 81 mg and plavix of 75mg , added coumadin for anticoagulation for the irregular plague. When iNR is greater than two , she can be safely taken off the plavix and aspirin.  Plan for SNF on discharge probably tomorrow.    Hypertension: Better controlled.  Increased the dose of metoprolol to 50 mg daily.    Diabetes mellitus: hgba1c is 8.4 CBG (last 3)   Recent Labs  11/21/14 2213 11/22/14 0639 11/22/14 1112  GLUCAP 151* 144* 187*   Resume SSI and lantus.    UTI: Completed the course of the ciprofloxacin.    Tachycardia: Resolved with metoprolol.       Procedures:  MRI brain  Consultations:  neurology  Discharge Exam: Filed Vitals:   11/22/14 1050  BP: 167/84  Pulse: 62  Temp: 98 F (36.7 C)  Resp: 18    General: alert afebrile comfortable Cardiovascular: s1s2 Respiratory: ctab  Discharge Instructions   Discharge Instructions    Diet - low sodium heart healthy    Complete by:  As directed      Discharge instructions    Complete by:  As directed   Please check INR tomorrow and dose the coumadin. Please stop the aspirin and plavix when the INR is therapeutic between 2 to 3.  Follow up with neurology as recommended.          Current Discharge Medication List    START taking these medications   Details  warfarin (COUMADIN) 7.5 MG tablet  Take 1 tablet (7.5 mg total) by mouth daily at 6 PM. Qty: 30 tablet      CONTINUE these medications which have CHANGED   Details  insulin glargine (LANTUS) 100 UNIT/ML injection Inject 0.65 mLs (65 Units total) into the skin at bedtime. Qty: 10 mL, Refills: 11    metoprolol succinate (TOPROL XL) 50 MG 24 hr tablet Take 1 tablet (50 mg total) by mouth daily. Take with or immediately following a meal.      CONTINUE these medications which have NOT CHANGED   Details  acetaminophen (TYLENOL) 500 MG tablet Take 500 mg by  mouth every 6 (six) hours as needed.      aspirin EC 81 MG tablet Take 81 mg by mouth daily.      atorvastatin (LIPITOR) 20 MG tablet Take 1 tablet (20 mg total) by mouth daily at 6 PM.    clopidogrel (PLAVIX) 75 MG tablet Take 1 tablet (75 mg total) by mouth daily.    cyanocobalamin (,VITAMIN B-12,) 1000 MCG/ML injection Inject 1,000 mcg into the muscle every 30 (thirty) days.      furosemide (LASIX) 40 MG tablet Take 60 mg by mouth daily.     gabapentin (NEURONTIN) 300 MG capsule Take 1 capsule (300 mg total) by mouth at bedtime.    glipiZIDE (GLUCOTROL XL) 10 MG 24 hr tablet Take 10 mg by mouth daily.      insulin regular (NOVOLIN R,HUMULIN R) 100 units/mL injection Inject 12-15 Units into the skin 3 (three) times daily before meals. 12 units before breakfast, 15 units before lunch and dinner    levothyroxine (SYNTHROID, LEVOTHROID) 125 MCG tablet Take 125 mcg by mouth daily.      LORazepam (ATIVAN) 1 MG tablet Take 1 tablet (1 mg total) by mouth 2 (two) times daily. Qty: 30 tablet, Refills: 0    losartan (COZAAR) 100 MG tablet Take 100 mg by mouth daily.      Multiple Vitamins-Minerals (MULTIVITAMIN WITH MINERALS) tablet Take 1 tablet by mouth daily.    pantoprazole (PROTONIX) 40 MG tablet Take 40 mg by mouth daily.    PARoxetine (PAXIL) 20 MG tablet Take 20 mg by mouth every morning.      potassium chloride SA (K-DUR,KLOR-CON) 20 MEQ tablet Take 20 mEq by mouth daily.         Allergies  Allergen Reactions  . Ivp Dye [Iodinated Diagnostic Agents]     Per pt: swelling with first contrast xray in 1969. Second contrast xray in the 90s without symptoms  . Penicillins Swelling and Rash   Follow-up Information    Follow up with Frederica Kuster, MD. Schedule an appointment as soon as possible for a visit in 1 week.   Specialty:  Family Medicine   Contact information:   [address] [place] [address] [phone]       Follow up with SETHI,PRAMOD, MD.    Specialties:  Neurology, Radiology   Why:  As needed   Contact information:   [address] [place] [address] [phone]        The results of significant diagnostics from this hospitalization (including imaging, microbiology, ancillary and laboratory) are listed below for reference.    Significant Diagnostic Studies: Ct Head Wo Contrast  11/17/2014   CLINICAL DATA:  Code stroke. Left-sided weakness and left facial droop.  EXAM: CT HEAD WITHOUT CONTRAST  TECHNIQUE: Contiguous axial images were obtained from the base of the skull through the vertex without  intravenous contrast.  COMPARISON:  MRI 10/13/2014, CT 10/13/2014  FINDINGS: Ventricle size is normal.  Cerebral volume normal for age  Multiple areas of chronic ischemia. Chronic infarct left anterior basal ganglia is unchanged. Chronic ischemia throughout the cerebral white matter bilaterally also unchanged.  Negative for acute ischemic infarction. Negative for hemorrhage or mass.  Calvarium intact.  IMPRESSION: Chronic ischemic changes are stable.  No acute abnormality.  Critical Value/emergent results were called by telephone at the time of interpretation on 11/17/2014 at 9:39 am to Dr. Thad Ranger , who verbally acknowledged these results.   Electronically Signed   By: Marlan Palau M.D.   On: 11/17/2014 09:39   Ct Angio Neck W/cm &/or Wo/cm  11/19/2014   CLINICAL DATA:  Stroke.  EXAM: CT ANGIOGRAPHY NECK  TECHNIQUE: Multidetector CT imaging of the neck was performed using the standard protocol during bolus administration of intravenous contrast. Multiplanar CT image reconstructions and MIPs were obtained to evaluate the vascular anatomy. Carotid stenosis measurements (when applicable) are obtained utilizing NASCET criteria, using the distal internal carotid diameter as the denominator.  CONTRAST:  OMNIPAQUE IOHEXOL 350 MG/ML SOLN  COMPARISON:  MRI head 11/17/2014  FINDINGS: Aortic arch: Minimal atherosclerotic disease in  the aortic arch. Three vessel aortic arch. Proximal great vessels widely patent.  Right carotid system: Right common carotid artery widely patent. Atherosclerotic plaque in the carotid bulb with irregularity and probable ulcerated plaque. This could be a source of emboli. No filling defect. 25% diameter stenosis of the right internal carotid artery. Right external carotid artery widely patent.  Left carotid system: Left common carotid artery widely patent. Mild atherosclerotic calcification the carotid bulb without significant stenosis  Vertebral arteries:Vertebral arteries are equal in size. Both vertebral arteries are patent to the basilar. Atherosclerotic plaque in the distal vertebral artery causing mild stenosis bilaterally.  Skeleton: Negative  Other neck: Negative for mass or adenopathy in the neck. Lung apices are clear.  IMPRESSION: 25% diameter stenosis of the proximal right internal carotid artery. Irregular plaque with possible ulceration. Potential source of emboli  Mild atherosclerotic disease of the proximal left internal carotid artery without significant stenosis.  Mild stenosis distal vertebral artery bilaterally secondary to atherosclerotic disease.   Electronically Signed   By: Marlan Palau M.D.   On: 11/19/2014 09:52   Ct Chest W Contrast  11/19/2014   CLINICAL DATA:  Recurrent strokes. Evaluation for underlying malignancy.  EXAM: CT CHEST, ABDOMEN, AND PELVIS WITH CONTRAST  TECHNIQUE: Multidetector CT imaging of the chest, abdomen and pelvis was performed following the standard protocol during bolus administration of intravenous contrast.  CONTRAST:  OMNIPAQUE IOHEXOL 350 MG/ML SOLN  COMPARISON:  Chest radiograph 11/17/2010. CT abdomen and pelvis 03/10/2010.  FINDINGS: CT CHEST FINDINGS  No enlarged axillary, mediastinal, or hilar lymph nodes are identified. Mild-to-moderate LAD coronary artery calcification is noted. The heart appears mildly enlarged. There is mild to moderate  thoracic aortic calcification.  There is no pleural or pericardial effusion. Evaluation of the lung parenchyma is mildly limited by motion artifact in the bases. No consolidation or nodules are identified. Major airways appear patent. Old right second through sixth rib fractures and an old left seventh rib fracture are noted.  CT ABDOMEN AND PELVIS FINDINGS  9 mm hypodensity in hepatic segment IVa is unchanged and most likely represents a cyst or biliary hamartoma. A calcification in the right hepatic lobe is unchanged. The gallbladder is surgically absent. No biliary dilatation is seen. The spleen, adrenal glands, and  pancreas are unremarkable. The kidneys are lobular in contour with suggestion of multiple areas of scarring. There are numerous small hypodensities throughout both kidneys, the majority of which are subcentimeter in size and too small to characterize although most likely represent cysts.  There is no evidence of bowel obstruction. No bowel wall thickening is identified. Appendix is unremarkable. The uterus is absent. Bladder is grossly unremarkable. No pelvic mass is seen. Multiple pelvic phleboliths are again noted.  Moderate aortoiliac atherosclerosis is present. No free fluid or enlarged lymph nodes are identified. Skin thickening and subcutaneous stranding and gas in the left lower is likely reflective of subcutaneous medication administration. Sclerotic focus in the left ilium is unchanged, likely a bone island. Mild multilevel lumbar disc degeneration is noted, with vacuum disc L1-2.  IMPRESSION: No evidence of malignancy in the chest, abdomen, or pelvis.   Electronically Signed   By: Sebastian Ache   On: 11/19/2014 09:53   Mr Brain Wo Contrast  11/17/2014   CLINICAL DATA:  64 year old female with recent thromboembolic stroke in February with left side weakness. New onset visual changes, double vision. Initial encounter.  EXAM: MRI HEAD WITHOUT CONTRAST  TECHNIQUE: Multiplanar, multiecho pulse  sequences of the brain and surrounding structures were obtained without intravenous contrast.  COMPARISON:  Head CT without contrast 0919 hours today. Brain MRI 10/13/2014.  FINDINGS: New patchy and confluent restricted diffusion in the right corona radiata near the superior aspect of the basal ganglia. Trace involvement of the right putamen. New small nearby posterior right temporal or parietal lobe white matter foci of mild restriction (series 3, image 27).  There is a tiny acute to subacute focus of restricted diffusion in the left parietal lobe on series 3, image 33. There is a small focus of left occipital lobe white matter restricted diffusion on image 23.  No posterior fossa restricted diffusion. Major intracranial vascular flow voids are stable.  Mild T2 and FLAIR hyperintensity associated with the acute findings today, no associated acute hemorrhage or mass effect. Widespread chronic lacunar infarcts in the left deep gray matter nuclei including with residual hemosiderin. Superimposed advanced and confluent cerebral white matter T2 and FLAIR hyperintensity. Scattered chronic micro hemorrhages in the brain. Confluent abnormal T2 hyperintensity in the pons is stable. No definite cerebral cortical encephalomalacia. The cerebellum is within normal limits.  Stable cerebral volume. No midline shift, mass effect, evidence of mass lesion, ventriculomegaly, extra-axial collection or acute intracranial hemorrhage. Cervicomedullary junction and pituitary are within normal limits. Negative visualized cervical spine. Normal bone marrow signal.  Visualized scalp soft tissues are within normal limits. Stable orbits soft tissues. Visualized paranasal sinuses and mastoids are clear.  IMPRESSION: 1. Recurrent acute small bilateral cerebral white matter infarcts, maximal today in the right corona radiata. No acute hemorrhage or mass effect. Synchronous small vessel ischemia versus recent embolic event are the top differential  considerations. 2. Underlying severe chronic small vessel disease.   Electronically Signed   By: Odessa Fleming M.D.   On: 11/17/2014 13:38   Ct Abdomen Pelvis W Contrast  11/19/2014   CLINICAL DATA:  Recurrent strokes. Evaluation for underlying malignancy.  EXAM: CT CHEST, ABDOMEN, AND PELVIS WITH CONTRAST  TECHNIQUE: Multidetector CT imaging of the chest, abdomen and pelvis was performed following the standard protocol during bolus administration of intravenous contrast.  CONTRAST:  OMNIPAQUE IOHEXOL 350 MG/ML SOLN  COMPARISON:  Chest radiograph 11/17/2010. CT abdomen and pelvis 03/10/2010.  FINDINGS: CT CHEST FINDINGS  No enlarged axillary, mediastinal, or  hilar lymph nodes are identified. Mild-to-moderate LAD coronary artery calcification is noted. The heart appears mildly enlarged. There is mild to moderate thoracic aortic calcification.  There is no pleural or pericardial effusion. Evaluation of the lung parenchyma is mildly limited by motion artifact in the bases. No consolidation or nodules are identified. Major airways appear patent. Old right second through sixth rib fractures and an old left seventh rib fracture are noted.  CT ABDOMEN AND PELVIS FINDINGS  9 mm hypodensity in hepatic segment IVa is unchanged and most likely represents a cyst or biliary hamartoma. A calcification in the right hepatic lobe is unchanged. The gallbladder is surgically absent. No biliary dilatation is seen. The spleen, adrenal glands, and pancreas are unremarkable. The kidneys are lobular in contour with suggestion of multiple areas of scarring. There are numerous small hypodensities throughout both kidneys, the majority of which are subcentimeter in size and too small to characterize although most likely represent cysts.  There is no evidence of bowel obstruction. No bowel wall thickening is identified. Appendix is unremarkable. The uterus is absent. Bladder is grossly unremarkable. No pelvic mass is seen. Multiple pelvic  phleboliths are again noted.  Moderate aortoiliac atherosclerosis is present. No free fluid or enlarged lymph nodes are identified. Skin thickening and subcutaneous stranding and gas in the left lower is likely reflective of subcutaneous medication administration. Sclerotic focus in the left ilium is unchanged, likely a bone island. Mild multilevel lumbar disc degeneration is noted, with vacuum disc L1-2.  IMPRESSION: No evidence of malignancy in the chest, abdomen, or pelvis.   Electronically Signed   By: Sebastian Ache   On: 11/19/2014 09:53    Microbiology: Recent Results (from the past 240 hour(s))  Urine culture     Status: None   Collection Time: 11/17/14 12:19 PM  Result Value Ref Range Status   Specimen Description URINE, CATHETERIZED  Final   Special Requests NONE  Final   Colony Count   Final    >=100,000 COLONIES/ML Performed at [facility]    Culture   Final    ESCHERICHIA COLI Performed at [facility]    Report Status 11/19/2014 FINAL  Final   Organism ID, Bacteria ESCHERICHIA COLI  Final      Susceptibility   Escherichia coli - MIC*    AMPICILLIN 4 SENSITIVE Sensitive     CEFAZOLIN <=4 SENSITIVE Sensitive     CEFTRIAXONE <=1 SENSITIVE Sensitive     CIPROFLOXACIN <=0.25 SENSITIVE Sensitive     GENTAMICIN <=1 SENSITIVE Sensitive     LEVOFLOXACIN <=0.12 SENSITIVE Sensitive     NITROFURANTOIN <=16 SENSITIVE Sensitive     TOBRAMYCIN <=1 SENSITIVE Sensitive     TRIMETH/SULFA <=20 SENSITIVE Sensitive     PIP/TAZO <=4 SENSITIVE Sensitive     * ESCHERICHIA COLI     Labs: Basic Metabolic Panel:  Recent Labs Lab 11/18/14 0629 11/19/14 0558 11/20/14 0530 11/21/14 0427 11/22/14 0622  NA 141 140 140 142 143  K 3.7 4.5 3.6 3.6 3.5  CL 104 104 106 108 108  CO2 25 28 27 22 23   GLUCOSE 163* 293* 196* 244* 153*  BUN 13 16 20 18 17   CREATININE 1.04 1.09 1.12* 0.99 0.87  CALCIUM 9.5 9.8 9.5 9.1 8.9   Liver Function Tests:  Recent Labs Lab  11/17/14 0928  AST 24  ALT 19  ALKPHOS 81  BILITOT 0.6  PROT 6.9  ALBUMIN 3.3*   No results for input(s): LIPASE, AMYLASE in the  last 168 hours. No results for input(s): AMMONIA in the last 168 hours. CBC:  Recent Labs Lab 11/17/14 0928  11/18/14 0629 11/19/14 0558 11/20/14 0530 11/21/14 0427 11/22/14 0622  WBC 9.9  --  9.7 13.3* 16.6* 12.4* 9.9  NEUTROABS 6.8  --   --   --   --   --   --   HGB 13.4  < > 14.0 14.2 13.3 12.7 12.7  HCT 40.4  < > 42.3 42.1 40.7 38.1 38.5  MCV 94.0  --  94.2 93.1 92.7 93.2 93.4  PLT 203  --  195 245 246 235 196  < > = values in this interval not displayed. Cardiac Enzymes:  Recent Labs Lab 11/20/14 1626 11/20/14 2123 11/21/14 0427  TROPONINI 0.03 <0.03 0.03   BNP: BNP (last 3 results) No results for input(s): BNP in the last 8760 hours.  ProBNP (last 3 results) No results for input(s): PROBNP in the last 8760 hours.  CBG:  Recent Labs Lab 11/21/14 1158 11/21/14 1641 11/21/14 2213 11/22/14 0639 11/22/14 1112  GLUCAP 245* 243* 151* 144* 187*       Signed:  Nakari Bracknell  [facility] 11/22/2014, 12:16 PM

## 2014-11-22 NOTE — Progress Notes (Addendum)
ANTICOAGULATION CONSULT NOTE - FOLLOW UP  Pharmacy Consult:  Coumadin Indication:  Recurrent strokes  Allergies  Allergen Reactions  . Ivp Dye [Iodinated Diagnostic Agents]     Per pt: swelling with first contrast xray in 1969. Second contrast xray in the 90s without symptoms  . Penicillins Swelling and Rash   Patient Measurements: Height: 5' (152.4 cm) Weight: 212 lb 8 oz (96.389 kg) IBW/kg (Calculated) : 45.5  Vital Signs: Temp: 97.8 F (36.6 C) (04/04 0629) Temp Source: Oral (04/04 0629) BP: 155/79 mmHg (04/04 0629) Pulse Rate: 59 (04/04 0629)  Labs:  Recent Labs  11/20/14 0530 11/20/14 1626 11/20/14 2123 11/21/14 0427 11/22/14 0622  HGB 13.3  --   --  12.7 12.7  HCT 40.7  --   --  38.1 38.5  PLT 246  --   --  235 196  LABPROT  --  14.1  --   --  13.8  INR  --  1.08  --   --  1.05  CREATININE 1.12*  --   --  0.99 0.87  TROPONINI  --  0.03 <0.03 0.03  --    Estimated Creatinine Clearance: 68 mL/min (by C-G formula based on Cr of 0.87).    Assessment: [age] YOF with recurrent CVAs to continue on Coumadin, ASA and Plavix with plan to discontinue Plavix and ASA once INR is therapeutic.  INR remains sub-therapeutic as expected given recent start.  No bleeding reported.  Noted Cipro may increase the effect of Coumadin.   Goal of Therapy:  INR 2-3    Plan:  - Coumadin 7.5mg  PO today - Daily PT / INR - F/U d/c Plavix and ASA once INR therapeutic per MD documentation - Please consider adding stop date for Cipro    Everlyn Farabaugh D. Laney Potashang, PharmD, BCPS Pager:  [phone] 11/22/2014, 8:27 AM

## 2014-11-22 NOTE — Clinical Social Work Note (Signed)
Clinical Social Worker facilitated patient discharge including contacting patient family and facility to confirm patient discharge plans.  Clinical information faxed to facility and family agreeable with plan.  CSW [facility].  RN to call report prior to discharge.  DC packet on chart for transport.   Clinical Social Worker will sign off for now as social work intervention is no longer needed. Please consult us again if new need arises.  Dawn Ho, MSW, LCSWA [phone] 11/22/2014 12:59 PM

## 2014-11-22 NOTE — Progress Notes (Signed)
Physical Therapy Treatment Patient Details Name: Dawn Ho MRN: [id number] DOB: [birth year] Today's Date: 11/22/2014    History of Present Illness Patient admitted from [facility] with left side weakness and blurred vision.  MRI showing multiple areas of infarct bilateral white matter, most prominent in corona radiata.    PT Comments    Patient progressing well with mobility, tolerated standing and pre gait activities well. Assist +2 provided with facilitation of posture and movement. Will continue to see and progress as tolerated.  Follow Up Recommendations  SNF;Supervision/Assistance - 24 hour     Equipment Recommendations  None recommended by PT    Recommendations for Other Services       Precautions / Restrictions Precautions Precautions: Fall Precaution Comments: Per PT note, pt has hx of multiple falls at SNF due to pt forgetting she needs help and tries to walk to the bathroom Restrictions Weight Bearing Restrictions: No    Mobility  Bed Mobility               General bed mobility comments: received in chair  Transfers Overall transfer level: Needs assistance Equipment used: Rolling walker (2 wheeled) Transfers: Sit to/from Stand Sit to Stand: +2 physical assistance;Min assist         General transfer comment: Performed sit <> stand with UE support for pre-gait training x4, patient positioned infront of bed rail, RUE hand held assist to hold rail, use of rail to power up to standing, with min assist for stability.  Ambulation/Gait Ambulation/Gait assistance: Mod assist Ambulation Distance (Feet): 10 Feet Assistive device: Rolling walker (2 wheeled) Gait Pattern/deviations: Step-to pattern;Decreased weight shift to right;Shuffle Gait velocity: decreased   General Gait Details: Gait training performed, manual faciliation of lateral weight shift to allow advancement of LEs, increased faciliation required to shift to the right with verbal and tactile  cues to assist with LLE placing.   Stairs            Wheelchair Mobility    Modified Rankin (Stroke Patients Only)       Balance     Sitting balance-Leahy Scale: Fair     Standing balance support: Bilateral upper extremity supported Standing balance-Leahy Scale: Poor Standing balance comment: static standing with assist >1 minutex4                    Cognition Arousal/Alertness: Awake/alert Behavior During Therapy: WFL for tasks assessed/performed Overall Cognitive Status: No family/caregiver present to determine baseline cognitive functioning Area of Impairment: Orientation;Following commands;Safety/judgement;Awareness;Problem solving;Memory     Memory: Decreased short-term memory Following Commands: Follows one step commands with increased time   Awareness: Intellectual Problem Solving: Slow processing;Requires verbal cues;Requires tactile cues General Comments: Improvements noted in cognition this session, but overall still demonstrates decreased awareness.      Exercises Other Exercises Other Exercises: marching in place 1 minute with cues for posture, positioning and weight shift    General Comments        Pertinent Vitals/Pain Pain Assessment: No/denies pain Faces Pain Scale: No hurt    Home Living                      Prior Function            PT Goals (current goals can now be found in the care plan section) Acute Rehab PT Goals Patient Stated Goal: to get better PT Goal Formulation: With patient/family Time For Goal Achievement: 12/09/14 Potential to Achieve Goals: Good Progress  towards PT goals: Progressing toward goals    Frequency  Min 3X/week    PT Plan Current plan remains appropriate    Co-evaluation             End of Session Equipment Utilized During Treatment: Gait belt Activity Tolerance: Patient limited by fatigue Patient left: in chair;with chair alarm set;with call bell/phone within reach;with  family/visitor present     Time: 6295-2841 PT Time Calculation (min) (ACUTE ONLY): 21 min  Charges:  $Therapeutic Activity: 8-22 mins                    G CodesFabio Asa 2014-12-22, 11:12 AM Charlotte Crumb, PT DPT  [phone]

## 2014-11-22 NOTE — Progress Notes (Signed)
Pt is being discharged to [facility]. Discharge  Instruction were given to patient

## 2014-11-22 NOTE — Progress Notes (Signed)
CARE MANAGEMENT NOTE 11/22/2014  Patient:  Dawn FoilGREEN,Dawn Ho   Account Number:  [id number]  Date Initiated:  11/19/2014  Documentation initiated by:  ROBARGE,COURTNEY  Subjective/Objective Assessment:   Patient was admitted with CVA. Resides at [facility]     Action/Plan:   Will follow for discharge needs   Anticipated DC Date:     Anticipated DC Plan:  SKILLED NURSING FACILITY  In-house referral  Clinical Social Worker         Choice offered to / List presented to:             Status of service:   Medicare Important Message given?  YES (If response is "NO", the following Medicare IM given date fields will be blank) Date Medicare IM given:  11/19/2014 Medicare IM given by:   Date Additional Medicare IM given:  11/22/2014 Additional Medicare IM given by:  [facility]  Discharge Disposition:    Per UR Regulation:  Reviewed for med. necessity/level of care/duration of stay  If discussed at Long Length of Stay Meetings, dates discussed:    Comments:  4-4 additional IM letter provided to patient. Lawerance Sabalebbie Naria Abbey RN BSN CM

## 2014-11-24 ENCOUNTER — Non-Acute Institutional Stay (SKILLED_NURSING_FACILITY): Payer: Medicare Other | Admitting: Internal Medicine

## 2014-11-24 DIAGNOSIS — I480 Paroxysmal atrial fibrillation: Secondary | ICD-10-CM | POA: Diagnosis not present

## 2014-11-24 DIAGNOSIS — I6990 Unspecified sequelae of unspecified cerebrovascular disease: Secondary | ICD-10-CM

## 2014-11-24 DIAGNOSIS — Z5181 Encounter for therapeutic drug level monitoring: Secondary | ICD-10-CM

## 2014-11-24 DIAGNOSIS — Z7901 Long term (current) use of anticoagulants: Secondary | ICD-10-CM

## 2014-11-24 DIAGNOSIS — E1342 Other specified diabetes mellitus with diabetic polyneuropathy: Secondary | ICD-10-CM | POA: Diagnosis not present

## 2014-11-24 LAB — BETA-2-GLYCOPROTEIN I ABS, IGG/M/A
Beta-2 Glyco I IgG: <9 GPI IgG units (ref 0–20)
Beta-2-Glycoprotein I IgA: <9 GPI IgA units (ref 0–25)

## 2014-11-28 NOTE — Progress Notes (Addendum)
Patient ID: Dawn Ho, female   DOB: 03/23/1951, 64 y.o.   MRN: [id number]                 HISTORY & PHYSICAL  DATE:  11/24/2014           FACILITY: [facility]        LEVEL OF CARE:   SNF   CHIEF COMPLAINT:  Readmission to SNF, post stay at [facility], 11/17/2014 through 11/22/2014.     HISTORY OF PRESENT ILLNESS:  This is an unfortunate, 64 year-old woman whom I admitted to the facility on 10/25/2014.   At that point, she had been in hospital from 10/13/2014 through 10/18/2014.    An MRI of the brain showed small foci of ischemic damage throughout both cerebral hemispheres with acute and subacute infarctions.  These were in the deep gray structures, white matter disease.  She also had extensive small vessel disease, remote infarcts within the left and right basal ganglia as well as the bilateral thalamus.  She had no significant stenosis of the medium or large cranial vessels.    She is a type 2 diabetic with hypertension.  Her diabetes was poorly controlled.    She was discharged to [place] at that point on Plavix and aspirin.  She has a history of AFib, but a TEE showed no intraventricular thrombus.  The patient was on Mobic.  I discontinued this because of the Plavix and aspirin, not to mention the stroke risk of long-term NSAID use.     I was not in the country last week, but she was sent to the hospital.  According to the nursing staff, she was noted to have altered LOC and possibly a new left facial droop.    She was admitted to hospital.  A repeat MRI of the brain was done.  There was new patchy and confluent restricted effusion in the high corona radiata near the superior aspect of the basal ganglia, trace involvement of the right putamen, new small nearby posterior right temporal and parietal white matter foci.  This is in addition to the widespread chronic lacunar infarctions of the deep gray matter and severe white matter disease.    She was discharged back to the facility  on a combination of Coumadin, Plavix, and aspirin.  Apparently, this raised some concern on behalf of the staff and our service was called on the phone.  In my absence, the Coumadin was discontinued.  She missed a dose.  Therefore, she is not anticoagulated where the instructions from the hospital were fairly clear to stop the Plavix and aspirin when she was fully therapeutic.    PAST MEDICAL HISTORY/PROBLEM LIST:         Severe bihemispheric stroke damage in the deep gray and white matter structures, as noted.    Fibromyalgia.   Type 2 diabetes.  On insulin.    Hypertension.    Hyperlipidemia.    Depression with anxiety.    Hypothyroidism.  On replacement.    PAST SURGICAL HISTORY:                 Cholecystectomy.    Cesarean section.    Transesophageal echo done during her last admission.    CURRENT MEDICATIONS:  Discharge medications include:     Tylenol 500 q.6 p.r.n.    ASA 81 q.d.      Lipitor 20 q.d.       Plavix 75 q.d.  Vitamin B12, 1000 IM every 30 days.    Lasix 60 q.d.      Neurontin 300 three times a day.    Glipizide 10 mg daily.    Novolin R 12 U before breakfast, 15 U before lunch and dinner.    Synthroid 125 q.d.      Lorazepam 1 mg b.i.d.      Cozaar 100 q.d.      Protonix 40 q.d.      Paxil 20 q.d.      Kcl 20 mEq daily.      SOCIAL HISTORY:                   HOUSING:  The patient previously lived in [place] with her husband.    FUNCTIONAL STATUS:  She was premorbidly, before the first hospitalization, functional enough to be left alone during the day.  She was independent with ADLs.   TOBACCO:   She is a never-smoker.     FAMILY HISTORY:  No history of strokes in siblings or parents.    REVIEW OF SYSTEMS:            HEENT:  The patient denies headache, diplopia, or swallowing difficulties.   CHEST/RESPIRATORY:  No shortness of breath.     CARDIAC:  No chest pain.    GI:  No abdominal pain or bowel changes.    GU:  No  dysuria.    NEUROLOGICAL:  Notes increasing weakness on the left.      PHYSICAL EXAMINATION:   VITAL SIGNS:     PULSE:  82 and regular.   RESPIRATIONS:  16 and unlabored.   02 SATURATIONS:  95% on room air.    GENERAL APPEARANCE:  Depressed-looking woman, but in no obvious medical distress.   CHEST/RESPIRATORY:  Shallow, but otherwise clear air entry bilaterally.     CARDIOVASCULAR:   CARDIAC:  Heart sounds are normal.  There are no murmurs.  No carotid bruits.   GASTROINTESTINAL:   ABDOMEN:  Obese, without masses or tenderness.   GENITOURINARY:   BLADDER:  No discernible bladder distention.   VASCULAR:   ARTERIAL:   Extremities:  His arterial pulses are palpable in her feet.   CIRCULATION:   EDEMA/VARICOSITIES:  There is no evidence of a DVT.   NEUROLOGICAL:     CRANIAL NERVES:  The changes appear to be some degree of left upper motor neuron facial weakness.          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  That is not new.   That was present on the last admission.  She has weakness of the left leg, which is quite a bit different than the last time I examined her.  Her right arm and leg strength are intact.   PSYCHIATRIC:   MENTAL STATUS:    I think this lady continues to be depressed.  She is on Paxil 20 mEq in the morning.  I will need to research the efficacy here.  I will put her on the list for Psychiatry.  I think she has significant depression.       ASSESSMENT/PLAN:                 Predominantly deep right gray structure new CVA resulting in left facial weakness and increasing weakness of the left leg.  Her left arm, I think, was already paralyzed from the last time she was here.  She came to [place] on Coumadin with instructions to  continue this until her INR was greater than 2.  Unfortunately, this was put on hold in the facility until they called me yesterday, which I restarted.  I will recheck an INR tomorrow, although I am not expecting this to be therapeutic.    Type 2  diabetes.  Her hemoglobin A1c checked on this admission was 8.4, which is quite a bit better than the 9.8 from 10/14/2014.    Her TSH was checked in the hospital on 11/17/2014 at 0.873.    Diabetic neuropathy.  I think this is the reason for the Neurontin.    Hyperlipidemia.  On a statin.  I do not see a recent lipid panel, which will need to be repeated at some point.    Gastroesophageal reflux disease and/or gastric cytoprotection with Protonix, I am not sure which.  However, the Protonix will continue for the indefinite future.    Inadequate anticoagulation.  Unfortunately, her Coumadin was put on hold on 11/22/2014.  She was already not anticoagulated when she left the hospital.

## 2014-12-01 ENCOUNTER — Non-Acute Institutional Stay (SKILLED_NURSING_FACILITY): Payer: Medicare Other | Admitting: Internal Medicine

## 2014-12-01 DIAGNOSIS — F331 Major depressive disorder, recurrent, moderate: Secondary | ICD-10-CM

## 2014-12-01 DIAGNOSIS — I6990 Unspecified sequelae of unspecified cerebrovascular disease: Secondary | ICD-10-CM

## 2014-12-08 NOTE — Progress Notes (Addendum)
Patient ID: Dawn Ho, female   DOB: [birth year], [age] y.o.   MRN: [id number]                PROGRESS NOTE  DATE:  12/01/2014            FACILITY: [facility]       LEVEL OF CARE:   SNF   Acute Visit                     CHIEF COMPLAINT:  Increasing confusion.      HISTORY OF PRESENT ILLNESS:  This is an unfortunate, 64 year-old woman who has gone through a series of strokes, most recently another stroke involving the right hemisphere during a hospitalization from 11/17/2014 through 11/22/2014.  She has a sister who is an employee in the facility.    After this most recent stay in the hospital, she has left arm and left leg weakness.  We have increasing her Coumadin  and I think we finally have this stabilized.  Her Plavix and aspirin have been stopped.  She is wearing some form of loop monitor as directed by Cardiology.    Staff have noted increasing confusion.  She has had three falls.  She had a negative urine.    In discussing things with her sister, she apparently has a longstanding history of depression and has spent time as a psychiatric inpatient in a hospital [place].   She currently is on Paxil 20 q.d.        PHYSICAL EXAMINATION:   GENERAL APPEARANCE:  The patient is not in overt medical distress.  However, she continues to look depressed, flat.  She will not engage in much conversation.   CHEST/RESPIRATORY:  Clear air entry bilaterally.    CARDIOVASCULAR:   CARDIAC:  Heart sounds are normal.  She appears to be euvolemic.       GASTROINTESTINAL:   ABDOMEN:  Obese.  No masses.     LIVER/SPLEEN/KIDNEYS:  No liver, no spleen.   GENITOURINARY:   BLADDER:  No suprapubic or costovertebral angle tenderness.    NEUROLOGICAL:   Her status from last week has not changed.   SENSATION/STRENGTH:  The left arm paralysis dates back to the original stroke some months ago.    She has weakness of the left leg, which is new.    PSYCHIATRIC:   MENTAL STATUS:  Very flat affect.   When I asked her where she was living before her stroke, she states, "Why do you want to know?"    ASSESSMENT/PLAN:                      Altered mental status.  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  She is on Paxil.  However, I do not have any information on this, its efficacy date.  She may require an antipsychotic, possibly Abilify.  She is due to be seen by Psychiatry next week.  I will see if I can hold off doing anything about this until they have a time to review her.    Recurrent strokes.  She is now on Coumadin.  I have adjusted her Coumadin to 6 mg today based on an INR of 2.2.  This was 2.7 on 11/29/2014.     CPT CODE: [id number]

## 2014-12-13 ENCOUNTER — Non-Acute Institutional Stay (SKILLED_NURSING_FACILITY): Payer: Medicare Other | Admitting: Internal Medicine

## 2014-12-13 DIAGNOSIS — I6990 Unspecified sequelae of unspecified cerebrovascular disease: Secondary | ICD-10-CM | POA: Diagnosis not present

## 2014-12-13 DIAGNOSIS — E1342 Other specified diabetes mellitus with diabetic polyneuropathy: Secondary | ICD-10-CM | POA: Diagnosis not present

## 2014-12-13 LAB — MDC_IDC_ENUM_SESS_TYPE_REMOTE

## 2014-12-14 ENCOUNTER — Ambulatory Visit (INDEPENDENT_AMBULATORY_CARE_PROVIDER_SITE_OTHER): Payer: Medicare Other | Admitting: *Deleted

## 2014-12-14 DIAGNOSIS — I639 Cerebral infarction, unspecified: Secondary | ICD-10-CM | POA: Diagnosis not present

## 2014-12-15 ENCOUNTER — Non-Acute Institutional Stay (SKILLED_NURSING_FACILITY): Payer: Medicare Other | Admitting: Internal Medicine

## 2014-12-15 DIAGNOSIS — E11649 Type 2 diabetes mellitus with hypoglycemia without coma: Secondary | ICD-10-CM | POA: Diagnosis not present

## 2014-12-15 NOTE — Progress Notes (Addendum)
Patient ID: Dawn Ho, female   DOB: 1951/05/27, 64 y.o.   MRN: [id number]                PROGRESS NOTE  DATE:  12/13/2014          FACILITY: [facility]                        LEVEL OF CARE:   SNF   Acute Visit                          CHIEF COMPLAINT:  Increasing confusion, delusional thought.       HISTORY OF PRESENT ILLNESS:  This is a difficult situation for this 64 year-old patient who has gone through a series of strokes, mostly recently another stroke involving the right hemisphere during the hospitalization from 11/17/2014 through 11/22/2014.  This has left her with left arm and left leg weakness.  She is now on Coumadin 5 mg a day.  Her next PT/INR is on 12/15/2014.    I had thought after her most recent stay in the hospital that she was suffering from major depression.  She was already on Paxil.   She has been seen by Psychiatry.  The Paxil was tapered to off and she was started on Zoloft at 25 mg, now up to 50 mg starting tomorrow.    She has also had a fall and was complaining of left arm pain.  X-rays of the area were negative.    In discussion with her today, and the staff, it is noted that she becomes fixed on things such as there was something under her seat and she could not be argued out of this.   She often will call out different people's names who are not really there.  The patient's only admission today is that she is convinced that her husband is having an affair at home with a younger woman.    LABORATORY DATA:  Lab work was done on her.    White count was 12.3, slightly elevated neutrophil count.    Her comprehensive metabolic panel was normal.    PHYSICAL EXAMINATION:   VITAL SIGNS:   O2 SATURATIONS:  98%.   RESPIRATIONS:    18.     PULSE:     120 and regular.    GENERAL APPEARANCE:  The patient is actually more engaging in conversation than the last time I saw her.   CHEST/RESPIRATORY:  Clear air entry bilaterally.    CARDIOVASCULAR:   CARDIAC:   Slightly tachycardic.   There are no murmurs.       GASTROINTESTINAL:   ABDOMEN:  Soft.   LIVER/SPLEEN/KIDNEYS:  No liver, no spleen.  No tenderness.     GENITOURINARY:   BLADDER:  There is no suprapubic, ?left-sided costovertebral angle tenderness.   NEUROLOGICAL:   I do not see much different here.  I see no new neurologic findings that are obvious.   SENSATION/STRENGTH:  She has left arm greater than left leg weakness.   PSYCHIATRIC:   MENTAL STATUS:  There is still a sadness about her.  I actually think this is somewhat better.  She is more direct in conversation, looks at me when I speak with her.      ASSESSMENT/PLAN:                 Post stroke major depression.  I wonder if this  could be a psychotic depression.  The patient tells me she woke up today intensely worried about her husband's infidelity.  I will monitor this.  I am hesitant to put her on an antipsychotic at this point as this does not appear to be a major issue.  From some senses, I think she is better.    Type 2 diabetes.  On insulin.  Her blood sugars do not appear to be badly controlled, mostly in the low 100s.   I see that it was 70 yesterday morning.  I am going to reduce her Lantus to 60 U subcu q.h.s.      Recurrent CVAs.  I do not see any neurologic issues here.    Lantus reduced to 60 U.  Continue the Humulin R 12 U before breakfast and 15 U a.c. lunch and dinner.     I see no injury to her left arm.     I am going to monitor her mental status for now without additional pharmacology.

## 2014-12-16 NOTE — Progress Notes (Signed)
Loop recorder 

## 2014-12-19 NOTE — Progress Notes (Addendum)
Patient ID: Dawn Ho, female   DOB: Apr 21, 1951, 64 y.o.   MRN: [id number]                PROGRESS NOTE  DATE:  12/15/2014            [facility]AC[facility]       LEVEL OF CARE:   SNF   Acute Visit               CHIEF COMPLAINT:  Follow up diabetes.       HISTORY OF PRESENT ILLNESS:  This is a 64 year-old woman who has been through a series of multiple strokes, most recently another stroke involving the right hemisphere during a hospitalization from 11/17/2014 through 11/22/2014.   She was transferred to Coumadin at that point.    She is also a type 2 diabetic, on insulin.  She was on 65 U.  I gave a verbal order a month ago to reduce this to 55 U, although apparently she is still on 9865 U even though the orders say 55 U.  She is also receiving 12 U every morning before breakfast and 5 U before lunch and dinner of Humulin as well as Glucotrol XL 10 mg.  Looking at this, her CBGs show that she is fairly low in the morning, this morning at 77.  On 12/10/2014, it was 76.  Later in the day, she is in the 160s to 200 range, it would seem.    Finally, we have her INR controlled, currently on 5 mg.  We will check her Coumadin in a week.     ASSESSMENT/PLAN:                          Type 2 diabetes.  I am going to actually hold the Lantus at 65 U, but discontinue the Glucotrol XL.  She may need further adjustments of the short-acting insulin, as well.

## 2014-12-22 ENCOUNTER — Other Ambulatory Visit: Payer: Self-pay | Admitting: *Deleted

## 2014-12-22 MED ORDER — LORAZEPAM 1 MG PO TABS: 1.0000 mg | ORAL_TABLET | Freq: Two times a day (BID) | ORAL | Status: DC

## 2014-12-23 ENCOUNTER — Other Ambulatory Visit: Payer: Self-pay | Admitting: *Deleted

## 2014-12-23 MED ORDER — LORAZEPAM 1 MG PO TABS: ORAL_TABLET | ORAL | Status: DC

## 2014-12-23 NOTE — Telephone Encounter (Signed)
[facility] 

## 2014-12-24 ENCOUNTER — Encounter: Payer: Self-pay | Admitting: Internal Medicine

## 2014-12-29 ENCOUNTER — Non-Acute Institutional Stay (SKILLED_NURSING_FACILITY): Payer: Medicare Other | Admitting: Internal Medicine

## 2014-12-29 DIAGNOSIS — I6990 Unspecified sequelae of unspecified cerebrovascular disease: Secondary | ICD-10-CM

## 2014-12-29 DIAGNOSIS — E1342 Other specified diabetes mellitus with diabetic polyneuropathy: Secondary | ICD-10-CM | POA: Diagnosis not present

## 2015-01-03 DIAGNOSIS — E119 Type 2 diabetes mellitus without complications: Secondary | ICD-10-CM | POA: Diagnosis not present

## 2015-01-03 DIAGNOSIS — Z79899 Other long term (current) drug therapy: Secondary | ICD-10-CM | POA: Diagnosis not present

## 2015-01-03 DIAGNOSIS — E782 Mixed hyperlipidemia: Secondary | ICD-10-CM | POA: Diagnosis not present

## 2015-01-03 DIAGNOSIS — E039 Hypothyroidism, unspecified: Secondary | ICD-10-CM | POA: Diagnosis not present

## 2015-01-05 NOTE — Progress Notes (Addendum)
Patient ID: Dawn Ho, female   DOB: 1950/08/26, 64 y.o.   MRN: [id number]                PROGRESS NOTE  DATE:  12/29/2014         [facility]        LEVEL OF CARE:   SNF   Acute Visit/Routine Visit                 CHIEF COMPLAINT:  Routine visit to follow medical issues/mental status.       HISTORY OF PRESENT ILLNESS:  This is an unfortunate woman who has undergone a series of strokes, most recently with another right hemisphere stroke, 11/17/2014 through 11/22/2014.  The recent stroke left her with left arm greater than left leg weakness.  She is now on Coumadin.    Staff and her family report increasing confusion.   She has been delusional and also depressed.  She has been followed by Psychiatry, up to 50 mg a day as of 12/07/2014.      I have adjusted her insulin.  Her CBGs are quite stable.  CBGs are mostly in the low 100s. This seems stable on 65 U of Lantus.        CURRENT MEDICATIONS:  Medication list is reviewed.           Toprol XL 50 q.d.      Lipitor 20 q.d.       Lasix 60 daily.      Synthroid 125 daily.      Cozaar 100 q.d.       Protonix 40 q.d.      K-Dur 20 mEq daily.      Zoloft 50 q.d.       Neurontin 300 at h.s.       Vitamin B12, 1000 monthly.      Coumadin 5 mg daily.    Lantus insulin 65 U at night and NovoLog 12 U before breakfast, 15 U before lunch and dinner.    PHYSICAL EXAMINATION:   GENERAL APPEARANCE:  The patient is not in any distress.    CHEST/RESPIRATORY:  Clear air entry bilaterally.    CARDIOVASCULAR:   CARDIAC:  Heart sounds are normal.  No signs of heart failure.   She appears to be euvolemic.   GASTROINTESTINAL:   ABDOMEN:  Soft, nontender, but obese.   NEUROLOGICAL:    CRANIAL NERVES:  Mild left upper motor neuron 7th.   SENSATION/STRENGTH:  She has very limited use of the left arm.  None of this has changed.    ASSESSMENT/PLAN:           Multi-infarct state.  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  I think this is some better than when I saw her a month ago.  I am going to increase the Zoloft.  She will need a follow-up TSH and electrolytes.   Type 2 diabetes: this seems stable     CPT CODE: [id number]

## 2015-01-12 ENCOUNTER — Non-Acute Institutional Stay (SKILLED_NURSING_FACILITY): Payer: Medicare Other | Admitting: Internal Medicine

## 2015-01-12 ENCOUNTER — Ambulatory Visit (INDEPENDENT_AMBULATORY_CARE_PROVIDER_SITE_OTHER): Payer: Medicare Other | Admitting: Neurology

## 2015-01-12 ENCOUNTER — Encounter: Payer: Self-pay | Admitting: Neurology

## 2015-01-12 VITALS — BP 136/78 | HR 88

## 2015-01-12 DIAGNOSIS — I639 Cerebral infarction, unspecified: Secondary | ICD-10-CM

## 2015-01-12 DIAGNOSIS — E1342 Other specified diabetes mellitus with diabetic polyneuropathy: Secondary | ICD-10-CM | POA: Diagnosis not present

## 2015-01-12 DIAGNOSIS — I6990 Unspecified sequelae of unspecified cerebrovascular disease: Secondary | ICD-10-CM

## 2015-01-12 DIAGNOSIS — G8110 Spastic hemiplegia affecting unspecified side: Secondary | ICD-10-CM

## 2015-01-12 DIAGNOSIS — F331 Major depressive disorder, recurrent, moderate: Secondary | ICD-10-CM | POA: Diagnosis not present

## 2015-01-12 MED ORDER — BACLOFEN 10 MG PO TABS: 10.0000 mg | ORAL_TABLET | Freq: Three times a day (TID) | ORAL | Status: DC

## 2015-01-12 NOTE — Progress Notes (Signed)
[facility] [address] [place]. [place] [address] [phone]       OFFICE FOLLOW-UP NOTE  [name] Date of Birth:  October 23, 1950 Medical Record Number:  [id number]   HPI: 18 year Caucasian lady seen today for the first office follow-up visit following hospital admission for stroke on 11/16/14. 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 She was admitted with worsening of her existing left-sided weakness with left facial droop and numbness. She presented outside the time window for thrombolysis. MRI scan of the brain showed a recurrent acute small bilateral white matter infarcts involving mostly the right frontal corona radiata. There was evidence of old strokes as well as small vessel disease. 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 Transthoracic echo showed normal ejection fraction. Contrast is a visually core showed no cardiac source of embolism or PFO. Urine drug screen was negative. Carotid ultrasound showed no significant extracranial stenosis. CT angiogram of the neck showed only 25% proximal right ICA stenosis. CT scan of the chest abdomen and pelvis revealed no evidence of malignancy. Patient had previously been on antiplatelets therapy and this was switched by Dr. 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 She had loop recorder implanted on 10/16/14 but yet had a stroke a month later and atrial fibrillation was not discovered. Lab work for hypercoagulable panel was negative. ESR was slightly elevated at 40. HIV and RPR were also nonreactive. Patient required significant help with ambulation and hence went to skilled nursing facility for rehabilitation. She is currently is still staying there. She requires one-person assist to ambulate with a walker with a gait safety belt. She complains of pain in the left arm and  shoulder but has not been started on spasticity medications. She is currently on Plavix and warfarin and states her sugars and blood pressure is well controlled. She is also on Zoloft for depression.  ROS:   14 system review of systems is positive for  left arm pain, weakness, gait difficulty, balance difficulties, memory loss and all other systems negative  PMH:  Past Medical History  Diagnosis Date  . Fibromyalgia   . Diabetes mellitus   . Hypertension   . Dyslipidemia   . TIA (transient ischemic attack)   . Depression with anxiety   . CVA (cerebral infarction)     Social History:  History   Social History  . Marital Status: Married    Spouse Name: N/A  . Number of Children: 2  . Years of Education: 16   Occupational History  .      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 Smoking status: Never Smoker   . Smokeless tobacco: Never Used  . Alcohol Use: No  . Drug Use: No  . Sexual Activity: Not on file   Other Topics Concern  . Not on file   Social History Narrative   Married, 2 children   Right handed   caffeinne use- occasionally    Medications:   Current Outpatient Prescriptions on File Prior to Visit  Medication Sig Dispense Refill  . acetaminophen (TYLENOL) 500 MG tablet Take 500 mg by mouth every 6 (six) hours as needed.      Marland Kitchen atorvastatin (LIPITOR) 20 MG tablet Take 1 tablet (20 mg total) by mouth daily at 6 PM.    . clopidogrel (PLAVIX) 75 MG  [facility] [address] [place]. [place] [address] [phone]       OFFICE FOLLOW-UP NOTE  [name] Date of Birth:  1951-02-17 Medical Record Number:  [id number]   HPI: 38 year Caucasian lady seen today for the first office follow-up visit following hospital admission for stroke on 11/16/14. 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 She was admitted with worsening of her existing left-sided weakness with left facial droop and numbness. She presented outside the time window for thrombolysis. MRI scan of the brain showed a recurrent acute small bilateral white matter infarcts involving mostly the right frontal corona radiata. There was evidence of old strokes as well as small vessel disease. 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 Transthoracic echo showed normal ejection fraction. Contrast is a visually core showed no cardiac source of embolism or PFO. Urine drug screen was negative. Carotid ultrasound showed no significant extracranial stenosis. CT angiogram of the neck showed only 25% proximal right ICA stenosis. CT scan of the chest abdomen and pelvis revealed no evidence of malignancy. Patient had previously been on antiplatelets therapy and this was switched by Dr. 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 She had loop recorder implanted on 10/16/14 but yet had a stroke a month later and atrial fibrillation was not discovered. Lab work for hypercoagulable panel was negative. ESR was slightly elevated at 40. HIV and RPR were also nonreactive. Patient required significant help with ambulation and hence went to skilled nursing facility for rehabilitation. She is currently is still staying there. She requires one-person assist to ambulate with a walker with a gait safety belt. She complains of pain in the left arm and  shoulder but has not been started on spasticity medications. She is currently on Plavix and warfarin and states her sugars and blood pressure is well controlled. She is also on Zoloft for depression.  ROS:   14 system review of systems is positive for  left arm pain, weakness, gait difficulty, balance difficulties, memory loss and all other systems negative  PMH:  Past Medical History  Diagnosis Date  . Fibromyalgia   . Diabetes mellitus   . Hypertension   . Dyslipidemia   . TIA (transient ischemic attack)   . Depression with anxiety   . CVA (cerebral infarction)     Social History:  History   Social History  . Marital Status: Married    Spouse Name: N/A  . Number of Children: 2  . Years of Education: 16   Occupational History  .      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 Smoking status: Never Smoker   . Smokeless tobacco: Never Used  . Alcohol Use: No  . Drug Use: No  . Sexual Activity: Not on file   Other Topics Concern  . Not on file   Social History Narrative   Married, 2 children   Right handed   caffeinne use- occasionally    Medications:   Current Outpatient Prescriptions on File Prior to Visit  Medication Sig Dispense Refill  . acetaminophen (TYLENOL) 500 MG tablet Take 500 mg by mouth every 6 (six) hours as needed.      Marland Kitchen atorvastatin (LIPITOR) 20 MG tablet Take 1 tablet (20 mg total) by mouth daily at 6 PM.    . clopidogrel (PLAVIX) 75 MG  [facility] [address] [place]. [place] [address] [phone]       OFFICE FOLLOW-UP NOTE  [name] Date of Birth:  Dec 31, 1950 Medical Record Number:  [id number]   HPI: 67 year Caucasian lady seen today for the first office follow-up visit following hospital admission for stroke on 11/16/14. 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 She was admitted with worsening of her existing left-sided weakness with left facial droop and numbness. She presented outside the time window for thrombolysis. MRI scan of the brain showed a recurrent acute small bilateral white matter infarcts involving mostly the right frontal corona radiata. There was evidence of old strokes as well as small vessel disease. 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 Transthoracic echo showed normal ejection fraction. Contrast is a visually core showed no cardiac source of embolism or PFO. Urine drug screen was negative. Carotid ultrasound showed no significant extracranial stenosis. CT angiogram of the neck showed only 25% proximal right ICA stenosis. CT scan of the chest abdomen and pelvis revealed no evidence of malignancy. Patient had previously been on antiplatelets therapy and this was switched by Dr. 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 She had loop recorder implanted on 10/16/14 but yet had a stroke a month later and atrial fibrillation was not discovered. Lab work for hypercoagulable panel was negative. ESR was slightly elevated at 40. HIV and RPR were also nonreactive. Patient required significant help with ambulation and hence went to skilled nursing facility for rehabilitation. She is currently is still staying there. She requires one-person assist to ambulate with a walker with a gait safety belt. She complains of pain in the left arm and  shoulder but has not been started on spasticity medications. She is currently on Plavix and warfarin and states her sugars and blood pressure is well controlled. She is also on Zoloft for depression.  ROS:   14 system review of systems is positive for  left arm pain, weakness, gait difficulty, balance difficulties, memory loss and all other systems negative  PMH:  Past Medical History  Diagnosis Date  . Fibromyalgia   . Diabetes mellitus   . Hypertension   . Dyslipidemia   . TIA (transient ischemic attack)   . Depression with anxiety   . CVA (cerebral infarction)     Social History:  History   Social History  . Marital Status: Married    Spouse Name: N/A  . Number of Children: 2  . Years of Education: 16   Occupational History  .      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 Smoking status: Never Smoker   . Smokeless tobacco: Never Used  . Alcohol Use: No  . Drug Use: No  . Sexual Activity: Not on file   Other Topics Concern  . Not on file   Social History Narrative   Married, 2 children   Right handed   caffeinne use- occasionally    Medications:   Current Outpatient Prescriptions on File Prior to Visit  Medication Sig Dispense Refill  . acetaminophen (TYLENOL) 500 MG tablet Take 500 mg by mouth every 6 (six) hours as needed.      Marland Kitchen atorvastatin (LIPITOR) 20 MG tablet Take 1 tablet (20 mg total) by mouth daily at 6 PM.    . clopidogrel (PLAVIX) 75 MG

## 2015-01-12 NOTE — Patient Instructions (Signed)
I had a long d/w patient about her recent and prior strokes, risk for recurrent stroke/TIAs, personally independently reviewed imaging studies and stroke evaluation results and answered questions.she has had multiple embolic strokes of cryptogenic etiology without definite identified source of embolism despite extensive evaluation, TEE, workup for malignancy and loop recorder implant. She has been empirically started on warfarin 2 months ago because of failure of antiplatelets therapy but I do not feel this is a good long-term solution as the risk-benefit of increasing bleeding complication is not acceptable Continue Plavix but discontinue warafrin for secondary stroke prevention and maintain strict control of hypertension with blood pressure goal below 130/90, diabetes with hemoglobin A1c goal below 6.5% and lipids with LDL cholesterol goal below 100 mg/dL. Continue ongoing physical and occupational therapy. Start baclofen 10 mg twice daily for 2 weeks to be increased to 3 times daily if tolerated for spasticity and pain and referred to rehabilitation clinic for Botox for spasticity as we cannot offer this service to Medicare patients currently in our office Followup in the future with me in  6 months or call earlier if necessary.  Stroke Prevention Some medical conditions and behaviors are associated with an increased chance of having a stroke. You may prevent a stroke by making healthy choices and managing medical conditions. HOW CAN I REDUCE MY RISK OF HAVING A STROKE?   Stay physically active. Get at least 30 minutes of activity on most or all days.  Do not smoke. It may also be helpful to avoid exposure to secondhand smoke.  Limit alcohol use. Moderate alcohol use is considered to be:  No more than 2 drinks per day for men.  No more than 1 drink per day for nonpregnant women.  Eat healthy foods. This involves:  Eating 5 or more servings of fruits and vegetables a day.  Making dietary changes  that address high blood pressure (hypertension), high cholesterol, diabetes, or obesity.  Manage your cholesterol levels.  Making food choices that are high in fiber and low in saturated fat, trans fat, and cholesterol may control cholesterol levels.  Take any prescribed medicines to control cholesterol as directed by your health care provider.  Manage your diabetes.  Controlling your carbohydrate and sugar intake is recommended to manage diabetes.  Take any prescribed medicines to control diabetes as directed by your health care provider.  Control your hypertension.  Making food choices that are low in salt (sodium), saturated fat, trans fat, and cholesterol is recommended to manage hypertension.  Take any prescribed medicines to control hypertension as directed by your health care provider.  Maintain a healthy weight.  Reducing calorie intake and making food choices that are low in sodium, saturated fat, trans fat, and cholesterol are recommended to manage weight.  Stop drug abuse.  Avoid taking birth control pills.  Talk to your health care provider about the risks of taking birth control pills if you are over 65 years old, smoke, get migraines, or have ever had a blood clot.  Get evaluated for sleep disorders (sleep apnea).  Talk to your health care provider about getting a sleep evaluation if you snore a lot or have excessive sleepiness.  Take medicines only as directed by your health care provider.  For some people, aspirin or blood thinners (anticoagulants) are helpful in reducing the risk of forming abnormal blood clots that can lead to stroke. If you have the irregular heart rhythm of atrial fibrillation, you should be on a blood thinner unless there is  a good reason you cannot take them.  Understand all your medicine instructions.  Make sure that other conditions (such as anemia or atherosclerosis) are addressed. SEEK IMMEDIATE MEDICAL CARE IF:   You have sudden  weakness or numbness of the face, arm, or leg, especially on one side of the body.  Your face or eyelid droops to one side.  You have sudden confusion.  You have trouble speaking (aphasia) or understanding.  You have sudden trouble seeing in one or both eyes.  You have sudden trouble walking.  You have dizziness.  You have a loss of balance or coordination.  You have a sudden, severe headache with no known cause.  You have new chest pain or an irregular heartbeat. Any of these symptoms may represent a serious problem that is an emergency. Do not wait to see if the symptoms will go away. Get medical help at once. Call your local emergency services (911 in [place] Do not drive yourself to the hospital. Document Released: 09/13/2004 Document Revised: 12/21/2013 Document Reviewed: 02/06/2013 [facility] Patient Information 2015 [place], [place]. This information is not intended to replace advice given to you by your health care provider. Make sure you discuss any questions you have with your health care provider.

## 2015-01-13 ENCOUNTER — Ambulatory Visit (INDEPENDENT_AMBULATORY_CARE_PROVIDER_SITE_OTHER): Payer: Medicare Other | Admitting: *Deleted

## 2015-01-13 DIAGNOSIS — I639 Cerebral infarction, unspecified: Secondary | ICD-10-CM | POA: Diagnosis not present

## 2015-01-14 NOTE — Progress Notes (Signed)
Loop recorder 

## 2015-01-18 LAB — CUP PACEART REMOTE DEVICE CHECK: Date Time Interrogation Session: 20160531135108

## 2015-01-18 NOTE — Progress Notes (Addendum)
Patient ID: Dawn Ho, female   DOB: 1950-11-25, 64 y.o.   MRN: [id number]                PROGRESS NOTE  DATE:  01/12/2015       FACILITY: [facility]                       LEVEL OF CARE:   SNF   Acute Visit                          CHIEF COMPLAINT:  Review of anticoagulation.        HISTORY OF PRESENT ILLNESS:   I have a consult note from Dr. Pearlean Ho from today with recommendations to discontinue warfarin and aspirin and to continue Plavix.  He also wanted her started on Baclofen and to refer for Botox in the left upper extremity.    I have reviewed her history.  She came here shortly after having a left hemisphere CVA.   MRI of the brain at that time revealed at least nine different foci of restricted diffusion present throughout both cerebral hemispheres.  The suggestion was acute and subacute infarctions over the last two weeks.    She has a remote history of atrial fibrillation/paroxysmal atrial fibrillation, although they did not actually catch this in the hospital.  TEE did not show any source of left ventricular thrombus.    Very shortly after her arrival here, she went on to have another stroke.   At that point, it was decided to place her on Coumadin.  Currently, she is on Coumadin 5 mg.        CURRENT MEDICATIONS:  Medication list is reviewed.             Toprol 50 mg daily.    Lipitor 20 q.d.      Lasix 60 q.d.      Synthroid 125 daily.       Cozaar 100 q.d.      Protonix 40 q.d.       K-Dur 20 q.d.       Zoloft 50 q.d.      Ativan 1 mg b.i.d.       Neurontin 300 at h.s.       Vitamin B12, 1000 monthly.      Coumadin 5 mg daily.    Humulin Regular 12 U before breakfast and 15 U before lunch and dinner.    Lantus 65 U subcu q.h.s.       REVIEW OF SYSTEMS:    CHEST/RESPIRATORY:  No cough.  No sputum.     CARDIAC:  No chest pain.   MUSCULOSKELETAL:  Left arm pain is true.   PSYCHIATRIC:  I think her mental status is considerably better.      PHYSICAL EXAMINATION:   NEUROLOGICAL:   She does indeed have some increase in tone.  Whether this warrants sending her to [facility] for Botox injections is not exactly clear to me.    She does not have any obvious new findings.  There is no tone in her legs.   Cardiac: HS are normal, no murmers and no bruits.   ASSESSMENT/PLAN:                          History of multiple CVAs, bihemispheric, which has been stable on Coumadin.  The patient had her most recent stroke on  Plavix and aspirin.  I would consider this a treatment failure.  She did have a loop recorder implant.  I do not think this showed atrial fibrillation.  However, she has been very stable on the Coumadin.  She is in the facility where we can monitor this.  I agree that secondary prevention is certainly indicated.  Continue coumadin.   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  I note that Psychiatry reduced her Zoloft.  I do not agree with this.  I am going to put it back to 75 mg.   I would leave this in place for at least the next 6-9 months.

## 2015-01-25 LAB — CUP PACEART REMOTE DEVICE CHECK: Date Time Interrogation Session: 20160607121846

## 2015-02-02 ENCOUNTER — Encounter: Payer: Self-pay | Admitting: Internal Medicine

## 2015-02-02 ENCOUNTER — Non-Acute Institutional Stay (SKILLED_NURSING_FACILITY): Payer: Medicare Other | Admitting: Internal Medicine

## 2015-02-02 DIAGNOSIS — I6990 Unspecified sequelae of unspecified cerebrovascular disease: Secondary | ICD-10-CM

## 2015-02-02 DIAGNOSIS — F19921 Other psychoactive substance use, unspecified with intoxication with delirium: Secondary | ICD-10-CM

## 2015-02-02 DIAGNOSIS — F331 Major depressive disorder, recurrent, moderate: Secondary | ICD-10-CM

## 2015-02-02 DIAGNOSIS — T50905A Adverse effect of unspecified drugs, medicaments and biological substances, initial encounter: Secondary | ICD-10-CM

## 2015-02-03 DIAGNOSIS — I639 Cerebral infarction, unspecified: Secondary | ICD-10-CM | POA: Diagnosis not present

## 2015-02-03 DIAGNOSIS — R293 Abnormal posture: Secondary | ICD-10-CM | POA: Diagnosis not present

## 2015-02-03 DIAGNOSIS — M6281 Muscle weakness (generalized): Secondary | ICD-10-CM | POA: Diagnosis not present

## 2015-02-03 DIAGNOSIS — G8104 Flaccid hemiplegia affecting left nondominant side: Secondary | ICD-10-CM | POA: Diagnosis not present

## 2015-02-03 DIAGNOSIS — G8929 Other chronic pain: Secondary | ICD-10-CM | POA: Diagnosis not present

## 2015-02-05 DIAGNOSIS — I639 Cerebral infarction, unspecified: Secondary | ICD-10-CM | POA: Diagnosis not present

## 2015-02-05 DIAGNOSIS — G8104 Flaccid hemiplegia affecting left nondominant side: Secondary | ICD-10-CM | POA: Diagnosis not present

## 2015-02-05 DIAGNOSIS — G8929 Other chronic pain: Secondary | ICD-10-CM | POA: Diagnosis not present

## 2015-02-05 DIAGNOSIS — R293 Abnormal posture: Secondary | ICD-10-CM | POA: Diagnosis not present

## 2015-02-05 DIAGNOSIS — M6281 Muscle weakness (generalized): Secondary | ICD-10-CM | POA: Diagnosis not present

## 2015-02-05 NOTE — Progress Notes (Addendum)
Patient ID: Dawn Ho, female   DOB: Jun 06, 1951, 64 y.o.   MRN: [id number]                PROGRESS NOTE  DATE:  02/02/2015          FACILITY: [facility]             LEVEL OF CARE:   SNF   Acute Visit                        CHIEF COMPLAINT:  Increasing lethargy.    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  MRI of the brain in the past has revealed several different areas of cerebral infarction which is bihemispheric.    Shortly after her arrival here, she was hospitalized again with stroke.  She has remained on Coumadin.     She is really quite disabled.  She has left hemiparesis.  Left arm is flaccid.  She can move her right side, but not the left arm.          CLINICAL DATA:  64 year old female with recent thromboembolic stroke in February with left side weakness. New onset visual changes, double vision. Initial encounter.   EXAM: MRI HEAD WITHOUT CONTRAST   TECHNIQUE: Multiplanar, multiecho pulse sequences of the brain and surrounding structures were obtained without intravenous contrast.   COMPARISON:  Head CT without contrast 0919 hours today. Brain MRI 10/13/2014.   FINDINGS: New patchy and confluent restricted diffusion in the right corona radiata near the superior aspect of the basal ganglia. Trace involvement of the right putamen. New small nearby posterior right temporal or parietal lobe white matter foci of mild restriction (series 3, image 27).   There is a tiny acute to subacute focus of restricted diffusion in the left parietal lobe on series 3, image 33. There is a small focus of left occipital lobe white matter restricted diffusion on image 23.   No posterior fossa restricted diffusion. Major intracranial vascular flow voids are stable.   Mild T2 and FLAIR hyperintensity associated with the acute findings today, no associated acute hemorrhage or mass effect.  Widespread chronic lacunar infarcts in the left deep gray matter nuclei including with residual hemosiderin. Superimposed advanced and confluent cerebral white matter T2 and FLAIR hyperintensity. Scattered chronic micro hemorrhages in the brain. Confluent abnormal T2 hyperintensity in the pons is stable. No definite cerebral cortical encephalomalacia. The cerebellum is within normal limits.   Stable cerebral volume. No midline shift, mass effect, evidence of mass lesion, ventriculomegaly, extra-axial collection or acute intracranial hemorrhage. Cervicomedullary junction and pituitary are within normal limits. Negative visualized cervical spine. Normal bone marrow signal.   Visualized scalp soft tissues are within normal limits. Stable orbits soft tissues. Visualized paranasal sinuses and mastoids are clear.   IMPRESSION: 1. Recurrent acute small bilateral cerebral white matter infarcts, maximal today in the right corona radiata. No acute hemorrhage or mass effect. Synchronous small vessel ischemia versus recent embolic event are the top differential considerations. 2. Underlying severe chronic small vessel disease.     Electronically Signed     CURRENT MEDICATIONS:  Medication list is reviewed.       Toprol XL 50 q.d.     Lipitor 20 q.d.       Lasix 60 q.d.       Synthroid 125 q.d.       Cozaar 100  q.d.          Protonix 40 q.d.       K-Dur 20 daily.     Zoloft 75 q.d.       Baclofen 10 mg t.i.d.     Vitamin B12 monthly.    Neurontin 300 at bedtime.    REVIEW OF SYSTEMS:    HEENT:  She is not complaining of headache.   CHEST/RESPIRATORY:  No cough.  No sputum.   CARDIAC:  No chest pain.   GI:  No abdominal pain.   MUSCULOSKELETAL:  She is complaining of left arm pain.  This is not a new complaint.  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  I had thought that  this was possibly a poststroke pain syndrome.       PHYSICAL EXAMINATION:   GENERAL APPEARANCE:  The patient is indeed somewhat more lethargic.  Her speech is less comprehensible.   CHEST/RESPIRATORY:  Clear air entry bilaterally.    CARDIOVASCULAR:   CARDIAC:  Heart sounds are normal.  She appears to be euvolemic.      GASTROINTESTINAL:   ABDOMEN:  No masses.   LIVER/SPLEEN/KIDNEYS:  No liver, no spleen.  No tenderness.   GENITOURINARY:   BLADDER:  No suprapubic or costovertebral angle tenderness.    MUSCULOSKELETAL:   EXTREMITIES:   LEFT UPPER EXTREMITY:  There is pain with movement in the left shoulder.   There is no effusion.  No evidence of an active arthritis here.    ASSESSMENT/PLAN:                 Increasing lethargy.  The patient is mumbling.  I certainly see that at the bedside. Although she is certainly at risk for recurrent CVA I don't believe this is what this is This looks like a drug effect.  The most likely culprits here would be the Baclofen that was recently started and recently increased.  This is a highly anticholinergic medication and can precipitate confusional states.  Another culprit here would be Zoloft.  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  I increased this from 50 to 75 about a month ago.  I am going to reduce this back to 50 mg.  I should note that the Zoloft had a positive effect, at least I thought so.  I also thought her depression was severe and life threatening   Left shoulder pain.  I wonder if this is some type of poststroke pain syndrome.  There are several notable types of this.  I note that she is on Neurontin and I might consider increasing this.  For now, I am going to put her on regular Tylenol Extra-Strength.  Consider radiology.   Anxiety.  She is on Ativan 1 mg p.o. b.i.d.  I am going to reduce this to 0.5 b.i.d.      CPT CODE: 33295

## 2015-02-07 DIAGNOSIS — M6281 Muscle weakness (generalized): Secondary | ICD-10-CM | POA: Diagnosis not present

## 2015-02-07 DIAGNOSIS — R293 Abnormal posture: Secondary | ICD-10-CM | POA: Diagnosis not present

## 2015-02-07 DIAGNOSIS — G8929 Other chronic pain: Secondary | ICD-10-CM | POA: Diagnosis not present

## 2015-02-07 DIAGNOSIS — G8104 Flaccid hemiplegia affecting left nondominant side: Secondary | ICD-10-CM | POA: Diagnosis not present

## 2015-02-07 DIAGNOSIS — I639 Cerebral infarction, unspecified: Secondary | ICD-10-CM | POA: Diagnosis not present

## 2015-02-08 DIAGNOSIS — I639 Cerebral infarction, unspecified: Secondary | ICD-10-CM | POA: Diagnosis not present

## 2015-02-08 DIAGNOSIS — G8929 Other chronic pain: Secondary | ICD-10-CM | POA: Diagnosis not present

## 2015-02-08 DIAGNOSIS — R293 Abnormal posture: Secondary | ICD-10-CM | POA: Diagnosis not present

## 2015-02-08 DIAGNOSIS — G8104 Flaccid hemiplegia affecting left nondominant side: Secondary | ICD-10-CM | POA: Diagnosis not present

## 2015-02-08 DIAGNOSIS — M6281 Muscle weakness (generalized): Secondary | ICD-10-CM | POA: Diagnosis not present

## 2015-02-09 DIAGNOSIS — G8104 Flaccid hemiplegia affecting left nondominant side: Secondary | ICD-10-CM | POA: Diagnosis not present

## 2015-02-09 DIAGNOSIS — I639 Cerebral infarction, unspecified: Secondary | ICD-10-CM | POA: Diagnosis not present

## 2015-02-09 DIAGNOSIS — G8929 Other chronic pain: Secondary | ICD-10-CM | POA: Diagnosis not present

## 2015-02-09 DIAGNOSIS — R293 Abnormal posture: Secondary | ICD-10-CM | POA: Diagnosis not present

## 2015-02-09 DIAGNOSIS — M6281 Muscle weakness (generalized): Secondary | ICD-10-CM | POA: Diagnosis not present

## 2015-02-10 DIAGNOSIS — M6281 Muscle weakness (generalized): Secondary | ICD-10-CM | POA: Diagnosis not present

## 2015-02-10 DIAGNOSIS — G8104 Flaccid hemiplegia affecting left nondominant side: Secondary | ICD-10-CM | POA: Diagnosis not present

## 2015-02-10 DIAGNOSIS — I639 Cerebral infarction, unspecified: Secondary | ICD-10-CM | POA: Diagnosis not present

## 2015-02-10 DIAGNOSIS — R293 Abnormal posture: Secondary | ICD-10-CM | POA: Diagnosis not present

## 2015-02-10 DIAGNOSIS — G8929 Other chronic pain: Secondary | ICD-10-CM | POA: Diagnosis not present

## 2015-02-11 ENCOUNTER — Ambulatory Visit (INDEPENDENT_AMBULATORY_CARE_PROVIDER_SITE_OTHER): Payer: Medicare Other | Admitting: *Deleted

## 2015-02-11 DIAGNOSIS — I639 Cerebral infarction, unspecified: Secondary | ICD-10-CM | POA: Diagnosis not present

## 2015-02-11 DIAGNOSIS — R293 Abnormal posture: Secondary | ICD-10-CM | POA: Diagnosis not present

## 2015-02-11 DIAGNOSIS — G8104 Flaccid hemiplegia affecting left nondominant side: Secondary | ICD-10-CM | POA: Diagnosis not present

## 2015-02-11 DIAGNOSIS — M6281 Muscle weakness (generalized): Secondary | ICD-10-CM | POA: Diagnosis not present

## 2015-02-11 DIAGNOSIS — G8929 Other chronic pain: Secondary | ICD-10-CM | POA: Diagnosis not present

## 2015-02-14 DIAGNOSIS — M6281 Muscle weakness (generalized): Secondary | ICD-10-CM | POA: Diagnosis not present

## 2015-02-14 DIAGNOSIS — G8104 Flaccid hemiplegia affecting left nondominant side: Secondary | ICD-10-CM | POA: Diagnosis not present

## 2015-02-14 DIAGNOSIS — G8929 Other chronic pain: Secondary | ICD-10-CM | POA: Diagnosis not present

## 2015-02-14 DIAGNOSIS — R293 Abnormal posture: Secondary | ICD-10-CM | POA: Diagnosis not present

## 2015-02-14 DIAGNOSIS — I639 Cerebral infarction, unspecified: Secondary | ICD-10-CM | POA: Diagnosis not present

## 2015-02-15 ENCOUNTER — Encounter: Payer: Self-pay | Admitting: Internal Medicine

## 2015-02-15 DIAGNOSIS — G8104 Flaccid hemiplegia affecting left nondominant side: Secondary | ICD-10-CM | POA: Diagnosis not present

## 2015-02-15 DIAGNOSIS — H2512 Age-related nuclear cataract, left eye: Secondary | ICD-10-CM | POA: Diagnosis not present

## 2015-02-15 DIAGNOSIS — R293 Abnormal posture: Secondary | ICD-10-CM | POA: Diagnosis not present

## 2015-02-15 DIAGNOSIS — I639 Cerebral infarction, unspecified: Secondary | ICD-10-CM | POA: Diagnosis not present

## 2015-02-15 DIAGNOSIS — M6281 Muscle weakness (generalized): Secondary | ICD-10-CM | POA: Diagnosis not present

## 2015-02-15 DIAGNOSIS — G8929 Other chronic pain: Secondary | ICD-10-CM | POA: Diagnosis not present

## 2015-02-15 DIAGNOSIS — E119 Type 2 diabetes mellitus without complications: Secondary | ICD-10-CM | POA: Diagnosis not present

## 2015-02-15 DIAGNOSIS — Z961 Presence of intraocular lens: Secondary | ICD-10-CM | POA: Diagnosis not present

## 2015-02-15 LAB — CUP PACEART REMOTE DEVICE CHECK: Date Time Interrogation Session: 20160628142545

## 2015-02-16 ENCOUNTER — Non-Acute Institutional Stay (SKILLED_NURSING_FACILITY): Payer: Medicare Other | Admitting: Internal Medicine

## 2015-02-16 DIAGNOSIS — B354 Tinea corporis: Secondary | ICD-10-CM

## 2015-02-16 DIAGNOSIS — R293 Abnormal posture: Secondary | ICD-10-CM | POA: Diagnosis not present

## 2015-02-16 DIAGNOSIS — I639 Cerebral infarction, unspecified: Secondary | ICD-10-CM | POA: Diagnosis not present

## 2015-02-16 DIAGNOSIS — G8929 Other chronic pain: Secondary | ICD-10-CM | POA: Diagnosis not present

## 2015-02-16 DIAGNOSIS — M6281 Muscle weakness (generalized): Secondary | ICD-10-CM | POA: Diagnosis not present

## 2015-02-16 DIAGNOSIS — G8104 Flaccid hemiplegia affecting left nondominant side: Secondary | ICD-10-CM | POA: Diagnosis not present

## 2015-02-16 NOTE — Progress Notes (Signed)
Loop recorder 

## 2015-02-17 DIAGNOSIS — G8104 Flaccid hemiplegia affecting left nondominant side: Secondary | ICD-10-CM | POA: Diagnosis not present

## 2015-02-17 DIAGNOSIS — I639 Cerebral infarction, unspecified: Secondary | ICD-10-CM | POA: Diagnosis not present

## 2015-02-17 DIAGNOSIS — M6281 Muscle weakness (generalized): Secondary | ICD-10-CM | POA: Diagnosis not present

## 2015-02-17 DIAGNOSIS — R293 Abnormal posture: Secondary | ICD-10-CM | POA: Diagnosis not present

## 2015-02-17 DIAGNOSIS — G8929 Other chronic pain: Secondary | ICD-10-CM | POA: Diagnosis not present

## 2015-02-18 DIAGNOSIS — I639 Cerebral infarction, unspecified: Secondary | ICD-10-CM | POA: Diagnosis not present

## 2015-02-18 DIAGNOSIS — G8104 Flaccid hemiplegia affecting left nondominant side: Secondary | ICD-10-CM | POA: Diagnosis not present

## 2015-02-18 DIAGNOSIS — G8929 Other chronic pain: Secondary | ICD-10-CM | POA: Diagnosis not present

## 2015-02-18 DIAGNOSIS — R293 Abnormal posture: Secondary | ICD-10-CM | POA: Diagnosis not present

## 2015-02-18 DIAGNOSIS — M6281 Muscle weakness (generalized): Secondary | ICD-10-CM | POA: Diagnosis not present

## 2015-02-21 DIAGNOSIS — M6281 Muscle weakness (generalized): Secondary | ICD-10-CM | POA: Diagnosis not present

## 2015-02-21 DIAGNOSIS — I639 Cerebral infarction, unspecified: Secondary | ICD-10-CM | POA: Diagnosis not present

## 2015-02-21 DIAGNOSIS — G8929 Other chronic pain: Secondary | ICD-10-CM | POA: Diagnosis not present

## 2015-02-21 DIAGNOSIS — R293 Abnormal posture: Secondary | ICD-10-CM | POA: Diagnosis not present

## 2015-02-21 DIAGNOSIS — G8104 Flaccid hemiplegia affecting left nondominant side: Secondary | ICD-10-CM | POA: Diagnosis not present

## 2015-02-22 DIAGNOSIS — G8929 Other chronic pain: Secondary | ICD-10-CM | POA: Diagnosis not present

## 2015-02-22 DIAGNOSIS — R293 Abnormal posture: Secondary | ICD-10-CM | POA: Diagnosis not present

## 2015-02-22 DIAGNOSIS — I639 Cerebral infarction, unspecified: Secondary | ICD-10-CM | POA: Diagnosis not present

## 2015-02-22 DIAGNOSIS — G8104 Flaccid hemiplegia affecting left nondominant side: Secondary | ICD-10-CM | POA: Diagnosis not present

## 2015-02-22 DIAGNOSIS — M6281 Muscle weakness (generalized): Secondary | ICD-10-CM | POA: Diagnosis not present

## 2015-02-22 NOTE — Progress Notes (Signed)
Patient ID: Dawn Ho, female   DOB: 22-Nov-1950, 64 y.o.   MRN: [id number]                PROGRESS NOTE  DATE:  02/16/2015                  FACILITY[facility]                   LEVEL OF CARE:   SNF   Acute Visit                      CHIEF COMPLAINT:  Rash in her left axilla.      HISTORY OF PRESENT ILLNESS:  The patient's health care aide noted a rash in the left axilla.  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    PHYSICAL EXAMINATION:   SKIN:   INSPECTION:  There is a fairly extensive tineal rash here in the left axilla.  The area is moist, no doubt contributing.        ASSESSMENT/PLAN:                 Tinea corporis, left axilla.  This will need to be cleaned and dried b.i.d.   Apply ketoconazole 2% for two weeks.  I have left these orders.       CPT CODE: [id number]

## 2015-02-23 DIAGNOSIS — R293 Abnormal posture: Secondary | ICD-10-CM | POA: Diagnosis not present

## 2015-02-23 DIAGNOSIS — M6281 Muscle weakness (generalized): Secondary | ICD-10-CM | POA: Diagnosis not present

## 2015-02-23 DIAGNOSIS — G8104 Flaccid hemiplegia affecting left nondominant side: Secondary | ICD-10-CM | POA: Diagnosis not present

## 2015-02-23 DIAGNOSIS — G8929 Other chronic pain: Secondary | ICD-10-CM | POA: Diagnosis not present

## 2015-02-23 DIAGNOSIS — I639 Cerebral infarction, unspecified: Secondary | ICD-10-CM | POA: Diagnosis not present

## 2015-02-24 DIAGNOSIS — I639 Cerebral infarction, unspecified: Secondary | ICD-10-CM | POA: Diagnosis not present

## 2015-02-24 DIAGNOSIS — G8104 Flaccid hemiplegia affecting left nondominant side: Secondary | ICD-10-CM | POA: Diagnosis not present

## 2015-02-24 DIAGNOSIS — G8929 Other chronic pain: Secondary | ICD-10-CM | POA: Diagnosis not present

## 2015-02-24 DIAGNOSIS — M6281 Muscle weakness (generalized): Secondary | ICD-10-CM | POA: Diagnosis not present

## 2015-02-24 DIAGNOSIS — R293 Abnormal posture: Secondary | ICD-10-CM | POA: Diagnosis not present

## 2015-02-25 DIAGNOSIS — G8104 Flaccid hemiplegia affecting left nondominant side: Secondary | ICD-10-CM | POA: Diagnosis not present

## 2015-02-25 DIAGNOSIS — M6281 Muscle weakness (generalized): Secondary | ICD-10-CM | POA: Diagnosis not present

## 2015-02-25 DIAGNOSIS — R293 Abnormal posture: Secondary | ICD-10-CM | POA: Diagnosis not present

## 2015-02-25 DIAGNOSIS — G8929 Other chronic pain: Secondary | ICD-10-CM | POA: Diagnosis not present

## 2015-02-25 DIAGNOSIS — I639 Cerebral infarction, unspecified: Secondary | ICD-10-CM | POA: Diagnosis not present

## 2015-02-26 DIAGNOSIS — G8104 Flaccid hemiplegia affecting left nondominant side: Secondary | ICD-10-CM | POA: Diagnosis not present

## 2015-02-26 DIAGNOSIS — G8929 Other chronic pain: Secondary | ICD-10-CM | POA: Diagnosis not present

## 2015-02-26 DIAGNOSIS — R293 Abnormal posture: Secondary | ICD-10-CM | POA: Diagnosis not present

## 2015-02-26 DIAGNOSIS — I639 Cerebral infarction, unspecified: Secondary | ICD-10-CM | POA: Diagnosis not present

## 2015-02-26 DIAGNOSIS — M6281 Muscle weakness (generalized): Secondary | ICD-10-CM | POA: Diagnosis not present

## 2015-02-28 DIAGNOSIS — I639 Cerebral infarction, unspecified: Secondary | ICD-10-CM | POA: Diagnosis not present

## 2015-02-28 DIAGNOSIS — G8104 Flaccid hemiplegia affecting left nondominant side: Secondary | ICD-10-CM | POA: Diagnosis not present

## 2015-02-28 DIAGNOSIS — R293 Abnormal posture: Secondary | ICD-10-CM | POA: Diagnosis not present

## 2015-02-28 DIAGNOSIS — G8929 Other chronic pain: Secondary | ICD-10-CM | POA: Diagnosis not present

## 2015-02-28 DIAGNOSIS — M6281 Muscle weakness (generalized): Secondary | ICD-10-CM | POA: Diagnosis not present

## 2015-03-01 ENCOUNTER — Encounter: Payer: Self-pay | Admitting: Internal Medicine

## 2015-03-01 DIAGNOSIS — G8929 Other chronic pain: Secondary | ICD-10-CM | POA: Diagnosis not present

## 2015-03-01 DIAGNOSIS — R293 Abnormal posture: Secondary | ICD-10-CM | POA: Diagnosis not present

## 2015-03-01 DIAGNOSIS — I639 Cerebral infarction, unspecified: Secondary | ICD-10-CM | POA: Diagnosis not present

## 2015-03-01 DIAGNOSIS — M6281 Muscle weakness (generalized): Secondary | ICD-10-CM | POA: Diagnosis not present

## 2015-03-01 DIAGNOSIS — G8104 Flaccid hemiplegia affecting left nondominant side: Secondary | ICD-10-CM | POA: Diagnosis not present

## 2015-03-02 DIAGNOSIS — G8104 Flaccid hemiplegia affecting left nondominant side: Secondary | ICD-10-CM | POA: Diagnosis not present

## 2015-03-02 DIAGNOSIS — M810 Age-related osteoporosis without current pathological fracture: Secondary | ICD-10-CM | POA: Diagnosis not present

## 2015-03-02 DIAGNOSIS — I639 Cerebral infarction, unspecified: Secondary | ICD-10-CM | POA: Diagnosis not present

## 2015-03-02 DIAGNOSIS — M6281 Muscle weakness (generalized): Secondary | ICD-10-CM | POA: Diagnosis not present

## 2015-03-02 DIAGNOSIS — R293 Abnormal posture: Secondary | ICD-10-CM | POA: Diagnosis not present

## 2015-03-02 DIAGNOSIS — G8929 Other chronic pain: Secondary | ICD-10-CM | POA: Diagnosis not present

## 2015-03-02 DIAGNOSIS — M25512 Pain in left shoulder: Secondary | ICD-10-CM | POA: Diagnosis not present

## 2015-03-03 ENCOUNTER — Non-Acute Institutional Stay (SKILLED_NURSING_FACILITY): Payer: Medicare Other | Admitting: Internal Medicine

## 2015-03-03 DIAGNOSIS — M25512 Pain in left shoulder: Secondary | ICD-10-CM

## 2015-03-03 DIAGNOSIS — M6281 Muscle weakness (generalized): Secondary | ICD-10-CM | POA: Diagnosis not present

## 2015-03-03 DIAGNOSIS — R293 Abnormal posture: Secondary | ICD-10-CM | POA: Diagnosis not present

## 2015-03-03 DIAGNOSIS — I6990 Unspecified sequelae of unspecified cerebrovascular disease: Secondary | ICD-10-CM | POA: Diagnosis not present

## 2015-03-03 DIAGNOSIS — I639 Cerebral infarction, unspecified: Secondary | ICD-10-CM | POA: Diagnosis not present

## 2015-03-03 DIAGNOSIS — G8104 Flaccid hemiplegia affecting left nondominant side: Secondary | ICD-10-CM | POA: Diagnosis not present

## 2015-03-03 DIAGNOSIS — G8929 Other chronic pain: Secondary | ICD-10-CM | POA: Diagnosis not present

## 2015-03-04 ENCOUNTER — Other Ambulatory Visit: Payer: Self-pay | Admitting: *Deleted

## 2015-03-04 DIAGNOSIS — G8929 Other chronic pain: Secondary | ICD-10-CM | POA: Diagnosis not present

## 2015-03-04 DIAGNOSIS — I639 Cerebral infarction, unspecified: Secondary | ICD-10-CM | POA: Diagnosis not present

## 2015-03-04 DIAGNOSIS — R293 Abnormal posture: Secondary | ICD-10-CM | POA: Diagnosis not present

## 2015-03-04 DIAGNOSIS — M6281 Muscle weakness (generalized): Secondary | ICD-10-CM | POA: Diagnosis not present

## 2015-03-04 DIAGNOSIS — G8104 Flaccid hemiplegia affecting left nondominant side: Secondary | ICD-10-CM | POA: Diagnosis not present

## 2015-03-04 MED ORDER — LORAZEPAM 0.5 MG PO TABS: ORAL_TABLET | ORAL | Status: DC

## 2015-03-04 NOTE — Telephone Encounter (Signed)
[facility] 

## 2015-03-06 DIAGNOSIS — I639 Cerebral infarction, unspecified: Secondary | ICD-10-CM | POA: Diagnosis not present

## 2015-03-06 DIAGNOSIS — G8929 Other chronic pain: Secondary | ICD-10-CM | POA: Diagnosis not present

## 2015-03-06 DIAGNOSIS — G8104 Flaccid hemiplegia affecting left nondominant side: Secondary | ICD-10-CM | POA: Diagnosis not present

## 2015-03-06 DIAGNOSIS — M6281 Muscle weakness (generalized): Secondary | ICD-10-CM | POA: Diagnosis not present

## 2015-03-06 DIAGNOSIS — R293 Abnormal posture: Secondary | ICD-10-CM | POA: Diagnosis not present

## 2015-03-07 DIAGNOSIS — G8929 Other chronic pain: Secondary | ICD-10-CM | POA: Diagnosis not present

## 2015-03-07 DIAGNOSIS — I639 Cerebral infarction, unspecified: Secondary | ICD-10-CM | POA: Diagnosis not present

## 2015-03-07 DIAGNOSIS — R293 Abnormal posture: Secondary | ICD-10-CM | POA: Diagnosis not present

## 2015-03-07 DIAGNOSIS — G8104 Flaccid hemiplegia affecting left nondominant side: Secondary | ICD-10-CM | POA: Diagnosis not present

## 2015-03-07 DIAGNOSIS — M6281 Muscle weakness (generalized): Secondary | ICD-10-CM | POA: Diagnosis not present

## 2015-03-08 DIAGNOSIS — G8929 Other chronic pain: Secondary | ICD-10-CM | POA: Diagnosis not present

## 2015-03-08 DIAGNOSIS — G8104 Flaccid hemiplegia affecting left nondominant side: Secondary | ICD-10-CM | POA: Diagnosis not present

## 2015-03-08 DIAGNOSIS — R293 Abnormal posture: Secondary | ICD-10-CM | POA: Diagnosis not present

## 2015-03-08 DIAGNOSIS — I639 Cerebral infarction, unspecified: Secondary | ICD-10-CM | POA: Diagnosis not present

## 2015-03-08 DIAGNOSIS — M6281 Muscle weakness (generalized): Secondary | ICD-10-CM | POA: Diagnosis not present

## 2015-03-08 NOTE — Progress Notes (Addendum)
Patient ID: Dawn Ho, female   DOB: 16-Feb-1951, 64 y.o.   MRN: [id number]                PROGRESS NOTE  DATE:  03/02/2015            FAC[facility]                     LEVEL OF CARE:   SNF   Acute Visit                   CHIEF COMPLAINT:  Left arm pain.       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  She has a history of multiple CVAs and has been stable on Coumadin.  I have elected to keep her on Coumadin in the facility.  She appears to be doing well.   Her INR is in the therapeutic range.    She has episodically complained of left arm pain which, at the time I saw her last, I thought was neuropathic pain after a stroke.  She has a history of falls, although I do not know that we have x-rayed the arm.  She is already on Neurontin.    Her daughter shared with me that she also has a history of fibromyalgia dating back many years.    CURRENT MEDICATIONS:  Medication list is reviewed.              Toprol XL 50 mg a day.    Lipitor 20 q.d.      Lasix 60 q.d.     Synthroid 125 q.d.       Cozaar 100 q.d.     Protonix 40 q.d.       K-Dur 20 mEq daily.    Zoloft 50 q.d.       PHYSICAL EXAMINATION:   MUSCULOSKELETAL:   EXTREMITIES:   LEFT UPPER EXTREMITY:  Left arm:   She keeps the arm close to her body.  This is somewhat contracted.  She is markedly tender at several areas in the arm including the anterior shoulder, the medial epicondyle at the elbow.  I do not believe there is any joint effusion.  Any range of motion in the shoulder, she finds uncomfortable, especially abduction.    ASSESSMENT/PLAN:                    Left shoulder pain.  I think this is spasticity.  I am going to increase her Neurontin.  She is on 300 q.h.s.  I will increase her to 300 b.i.d.   I will add routine Tylenol Extra-Strength.When  Baclofen was increased to 10 mg t.i.d., I think added to some confusion, so I will avoid  that.  An x-ray is not out of the question here.

## 2015-03-09 DIAGNOSIS — I639 Cerebral infarction, unspecified: Secondary | ICD-10-CM | POA: Diagnosis not present

## 2015-03-09 DIAGNOSIS — G8929 Other chronic pain: Secondary | ICD-10-CM | POA: Diagnosis not present

## 2015-03-09 DIAGNOSIS — M6281 Muscle weakness (generalized): Secondary | ICD-10-CM | POA: Diagnosis not present

## 2015-03-09 DIAGNOSIS — R293 Abnormal posture: Secondary | ICD-10-CM | POA: Diagnosis not present

## 2015-03-09 DIAGNOSIS — G8104 Flaccid hemiplegia affecting left nondominant side: Secondary | ICD-10-CM | POA: Diagnosis not present

## 2015-03-10 DIAGNOSIS — I639 Cerebral infarction, unspecified: Secondary | ICD-10-CM | POA: Diagnosis not present

## 2015-03-10 DIAGNOSIS — G8104 Flaccid hemiplegia affecting left nondominant side: Secondary | ICD-10-CM | POA: Diagnosis not present

## 2015-03-10 DIAGNOSIS — G8929 Other chronic pain: Secondary | ICD-10-CM | POA: Diagnosis not present

## 2015-03-10 DIAGNOSIS — M6281 Muscle weakness (generalized): Secondary | ICD-10-CM | POA: Diagnosis not present

## 2015-03-10 DIAGNOSIS — R293 Abnormal posture: Secondary | ICD-10-CM | POA: Diagnosis not present

## 2015-03-11 DIAGNOSIS — I639 Cerebral infarction, unspecified: Secondary | ICD-10-CM | POA: Diagnosis not present

## 2015-03-11 DIAGNOSIS — M6281 Muscle weakness (generalized): Secondary | ICD-10-CM | POA: Diagnosis not present

## 2015-03-11 DIAGNOSIS — R293 Abnormal posture: Secondary | ICD-10-CM | POA: Diagnosis not present

## 2015-03-11 DIAGNOSIS — G8104 Flaccid hemiplegia affecting left nondominant side: Secondary | ICD-10-CM | POA: Diagnosis not present

## 2015-03-11 DIAGNOSIS — G8929 Other chronic pain: Secondary | ICD-10-CM | POA: Diagnosis not present

## 2015-03-12 DIAGNOSIS — G8104 Flaccid hemiplegia affecting left nondominant side: Secondary | ICD-10-CM | POA: Diagnosis not present

## 2015-03-12 DIAGNOSIS — I639 Cerebral infarction, unspecified: Secondary | ICD-10-CM | POA: Diagnosis not present

## 2015-03-12 DIAGNOSIS — R293 Abnormal posture: Secondary | ICD-10-CM | POA: Diagnosis not present

## 2015-03-12 DIAGNOSIS — G8929 Other chronic pain: Secondary | ICD-10-CM | POA: Diagnosis not present

## 2015-03-12 DIAGNOSIS — M6281 Muscle weakness (generalized): Secondary | ICD-10-CM | POA: Diagnosis not present

## 2015-03-14 ENCOUNTER — Ambulatory Visit (INDEPENDENT_AMBULATORY_CARE_PROVIDER_SITE_OTHER): Payer: Medicare Other

## 2015-03-14 DIAGNOSIS — M6281 Muscle weakness (generalized): Secondary | ICD-10-CM | POA: Diagnosis not present

## 2015-03-14 DIAGNOSIS — G8929 Other chronic pain: Secondary | ICD-10-CM | POA: Diagnosis not present

## 2015-03-14 DIAGNOSIS — I639 Cerebral infarction, unspecified: Secondary | ICD-10-CM | POA: Diagnosis not present

## 2015-03-14 DIAGNOSIS — G8104 Flaccid hemiplegia affecting left nondominant side: Secondary | ICD-10-CM | POA: Diagnosis not present

## 2015-03-14 DIAGNOSIS — R293 Abnormal posture: Secondary | ICD-10-CM | POA: Diagnosis not present

## 2015-03-15 DIAGNOSIS — I639 Cerebral infarction, unspecified: Secondary | ICD-10-CM | POA: Diagnosis not present

## 2015-03-15 DIAGNOSIS — G8929 Other chronic pain: Secondary | ICD-10-CM | POA: Diagnosis not present

## 2015-03-15 DIAGNOSIS — M6281 Muscle weakness (generalized): Secondary | ICD-10-CM | POA: Diagnosis not present

## 2015-03-15 DIAGNOSIS — R293 Abnormal posture: Secondary | ICD-10-CM | POA: Diagnosis not present

## 2015-03-15 DIAGNOSIS — G8104 Flaccid hemiplegia affecting left nondominant side: Secondary | ICD-10-CM | POA: Diagnosis not present

## 2015-03-16 DIAGNOSIS — I639 Cerebral infarction, unspecified: Secondary | ICD-10-CM | POA: Diagnosis not present

## 2015-03-16 DIAGNOSIS — G8929 Other chronic pain: Secondary | ICD-10-CM | POA: Diagnosis not present

## 2015-03-16 DIAGNOSIS — R293 Abnormal posture: Secondary | ICD-10-CM | POA: Diagnosis not present

## 2015-03-16 DIAGNOSIS — M6281 Muscle weakness (generalized): Secondary | ICD-10-CM | POA: Diagnosis not present

## 2015-03-16 DIAGNOSIS — G8104 Flaccid hemiplegia affecting left nondominant side: Secondary | ICD-10-CM | POA: Diagnosis not present

## 2015-03-17 DIAGNOSIS — M6281 Muscle weakness (generalized): Secondary | ICD-10-CM | POA: Diagnosis not present

## 2015-03-17 DIAGNOSIS — R293 Abnormal posture: Secondary | ICD-10-CM | POA: Diagnosis not present

## 2015-03-17 DIAGNOSIS — G8104 Flaccid hemiplegia affecting left nondominant side: Secondary | ICD-10-CM | POA: Diagnosis not present

## 2015-03-17 DIAGNOSIS — G8929 Other chronic pain: Secondary | ICD-10-CM | POA: Diagnosis not present

## 2015-03-17 DIAGNOSIS — I639 Cerebral infarction, unspecified: Secondary | ICD-10-CM | POA: Diagnosis not present

## 2015-03-18 DIAGNOSIS — I639 Cerebral infarction, unspecified: Secondary | ICD-10-CM | POA: Diagnosis not present

## 2015-03-18 DIAGNOSIS — G8104 Flaccid hemiplegia affecting left nondominant side: Secondary | ICD-10-CM | POA: Diagnosis not present

## 2015-03-18 DIAGNOSIS — R293 Abnormal posture: Secondary | ICD-10-CM | POA: Diagnosis not present

## 2015-03-18 DIAGNOSIS — M6281 Muscle weakness (generalized): Secondary | ICD-10-CM | POA: Diagnosis not present

## 2015-03-18 DIAGNOSIS — F4489 Other dissociative and conversion disorders: Secondary | ICD-10-CM | POA: Diagnosis not present

## 2015-03-18 DIAGNOSIS — I6789 Other cerebrovascular disease: Secondary | ICD-10-CM | POA: Diagnosis not present

## 2015-03-18 DIAGNOSIS — G8929 Other chronic pain: Secondary | ICD-10-CM | POA: Diagnosis not present

## 2015-03-19 DIAGNOSIS — G8929 Other chronic pain: Secondary | ICD-10-CM | POA: Diagnosis not present

## 2015-03-19 DIAGNOSIS — G8104 Flaccid hemiplegia affecting left nondominant side: Secondary | ICD-10-CM | POA: Diagnosis not present

## 2015-03-19 DIAGNOSIS — M6281 Muscle weakness (generalized): Secondary | ICD-10-CM | POA: Diagnosis not present

## 2015-03-19 DIAGNOSIS — R293 Abnormal posture: Secondary | ICD-10-CM | POA: Diagnosis not present

## 2015-03-19 DIAGNOSIS — I639 Cerebral infarction, unspecified: Secondary | ICD-10-CM | POA: Diagnosis not present

## 2015-03-21 DIAGNOSIS — I639 Cerebral infarction, unspecified: Secondary | ICD-10-CM | POA: Diagnosis not present

## 2015-03-21 DIAGNOSIS — R293 Abnormal posture: Secondary | ICD-10-CM | POA: Diagnosis not present

## 2015-03-21 DIAGNOSIS — G8929 Other chronic pain: Secondary | ICD-10-CM | POA: Diagnosis not present

## 2015-03-21 DIAGNOSIS — M6281 Muscle weakness (generalized): Secondary | ICD-10-CM | POA: Diagnosis not present

## 2015-03-21 DIAGNOSIS — G8104 Flaccid hemiplegia affecting left nondominant side: Secondary | ICD-10-CM | POA: Diagnosis not present

## 2015-03-22 DIAGNOSIS — I639 Cerebral infarction, unspecified: Secondary | ICD-10-CM | POA: Diagnosis not present

## 2015-03-22 DIAGNOSIS — G8104 Flaccid hemiplegia affecting left nondominant side: Secondary | ICD-10-CM | POA: Diagnosis not present

## 2015-03-22 DIAGNOSIS — G8929 Other chronic pain: Secondary | ICD-10-CM | POA: Diagnosis not present

## 2015-03-22 DIAGNOSIS — M6281 Muscle weakness (generalized): Secondary | ICD-10-CM | POA: Diagnosis not present

## 2015-03-22 DIAGNOSIS — R293 Abnormal posture: Secondary | ICD-10-CM | POA: Diagnosis not present

## 2015-03-23 DIAGNOSIS — M6281 Muscle weakness (generalized): Secondary | ICD-10-CM | POA: Diagnosis not present

## 2015-03-23 DIAGNOSIS — R293 Abnormal posture: Secondary | ICD-10-CM | POA: Diagnosis not present

## 2015-03-23 DIAGNOSIS — G8104 Flaccid hemiplegia affecting left nondominant side: Secondary | ICD-10-CM | POA: Diagnosis not present

## 2015-03-23 DIAGNOSIS — I639 Cerebral infarction, unspecified: Secondary | ICD-10-CM | POA: Diagnosis not present

## 2015-03-23 DIAGNOSIS — G8929 Other chronic pain: Secondary | ICD-10-CM | POA: Diagnosis not present

## 2015-03-23 NOTE — Progress Notes (Signed)
Loop recorder 

## 2015-03-24 DIAGNOSIS — R293 Abnormal posture: Secondary | ICD-10-CM | POA: Diagnosis not present

## 2015-03-24 DIAGNOSIS — G8104 Flaccid hemiplegia affecting left nondominant side: Secondary | ICD-10-CM | POA: Diagnosis not present

## 2015-03-24 DIAGNOSIS — M6281 Muscle weakness (generalized): Secondary | ICD-10-CM | POA: Diagnosis not present

## 2015-03-24 DIAGNOSIS — I639 Cerebral infarction, unspecified: Secondary | ICD-10-CM | POA: Diagnosis not present

## 2015-03-24 DIAGNOSIS — G8929 Other chronic pain: Secondary | ICD-10-CM | POA: Diagnosis not present

## 2015-03-25 DIAGNOSIS — R293 Abnormal posture: Secondary | ICD-10-CM | POA: Diagnosis not present

## 2015-03-25 DIAGNOSIS — M6281 Muscle weakness (generalized): Secondary | ICD-10-CM | POA: Diagnosis not present

## 2015-03-25 DIAGNOSIS — G8104 Flaccid hemiplegia affecting left nondominant side: Secondary | ICD-10-CM | POA: Diagnosis not present

## 2015-03-25 DIAGNOSIS — I639 Cerebral infarction, unspecified: Secondary | ICD-10-CM | POA: Diagnosis not present

## 2015-03-25 DIAGNOSIS — G8929 Other chronic pain: Secondary | ICD-10-CM | POA: Diagnosis not present

## 2015-03-28 DIAGNOSIS — G8929 Other chronic pain: Secondary | ICD-10-CM | POA: Diagnosis not present

## 2015-03-28 DIAGNOSIS — G8104 Flaccid hemiplegia affecting left nondominant side: Secondary | ICD-10-CM | POA: Diagnosis not present

## 2015-03-28 DIAGNOSIS — M6281 Muscle weakness (generalized): Secondary | ICD-10-CM | POA: Diagnosis not present

## 2015-03-28 DIAGNOSIS — R293 Abnormal posture: Secondary | ICD-10-CM | POA: Diagnosis not present

## 2015-03-28 DIAGNOSIS — I639 Cerebral infarction, unspecified: Secondary | ICD-10-CM | POA: Diagnosis not present

## 2015-03-29 DIAGNOSIS — I639 Cerebral infarction, unspecified: Secondary | ICD-10-CM | POA: Diagnosis not present

## 2015-03-29 DIAGNOSIS — M6281 Muscle weakness (generalized): Secondary | ICD-10-CM | POA: Diagnosis not present

## 2015-03-29 DIAGNOSIS — G8104 Flaccid hemiplegia affecting left nondominant side: Secondary | ICD-10-CM | POA: Diagnosis not present

## 2015-03-29 DIAGNOSIS — R293 Abnormal posture: Secondary | ICD-10-CM | POA: Diagnosis not present

## 2015-03-29 DIAGNOSIS — G8929 Other chronic pain: Secondary | ICD-10-CM | POA: Diagnosis not present

## 2015-03-30 DIAGNOSIS — R293 Abnormal posture: Secondary | ICD-10-CM | POA: Diagnosis not present

## 2015-03-30 DIAGNOSIS — M6281 Muscle weakness (generalized): Secondary | ICD-10-CM | POA: Diagnosis not present

## 2015-03-30 DIAGNOSIS — I639 Cerebral infarction, unspecified: Secondary | ICD-10-CM | POA: Diagnosis not present

## 2015-03-30 DIAGNOSIS — G8929 Other chronic pain: Secondary | ICD-10-CM | POA: Diagnosis not present

## 2015-03-30 DIAGNOSIS — G8104 Flaccid hemiplegia affecting left nondominant side: Secondary | ICD-10-CM | POA: Diagnosis not present

## 2015-03-31 DIAGNOSIS — G8929 Other chronic pain: Secondary | ICD-10-CM | POA: Diagnosis not present

## 2015-03-31 DIAGNOSIS — G8104 Flaccid hemiplegia affecting left nondominant side: Secondary | ICD-10-CM | POA: Diagnosis not present

## 2015-03-31 DIAGNOSIS — I639 Cerebral infarction, unspecified: Secondary | ICD-10-CM | POA: Diagnosis not present

## 2015-03-31 DIAGNOSIS — M6281 Muscle weakness (generalized): Secondary | ICD-10-CM | POA: Diagnosis not present

## 2015-03-31 DIAGNOSIS — R293 Abnormal posture: Secondary | ICD-10-CM | POA: Diagnosis not present

## 2015-04-01 DIAGNOSIS — G8929 Other chronic pain: Secondary | ICD-10-CM | POA: Diagnosis not present

## 2015-04-01 DIAGNOSIS — G8104 Flaccid hemiplegia affecting left nondominant side: Secondary | ICD-10-CM | POA: Diagnosis not present

## 2015-04-01 DIAGNOSIS — R293 Abnormal posture: Secondary | ICD-10-CM | POA: Diagnosis not present

## 2015-04-01 DIAGNOSIS — I639 Cerebral infarction, unspecified: Secondary | ICD-10-CM | POA: Diagnosis not present

## 2015-04-01 DIAGNOSIS — M6281 Muscle weakness (generalized): Secondary | ICD-10-CM | POA: Diagnosis not present

## 2015-04-01 LAB — CUP PACEART REMOTE DEVICE CHECK: Date Time Interrogation Session: 20160812142509

## 2015-04-03 DIAGNOSIS — G8104 Flaccid hemiplegia affecting left nondominant side: Secondary | ICD-10-CM | POA: Diagnosis not present

## 2015-04-03 DIAGNOSIS — G8929 Other chronic pain: Secondary | ICD-10-CM | POA: Diagnosis not present

## 2015-04-03 DIAGNOSIS — R293 Abnormal posture: Secondary | ICD-10-CM | POA: Diagnosis not present

## 2015-04-03 DIAGNOSIS — M6281 Muscle weakness (generalized): Secondary | ICD-10-CM | POA: Diagnosis not present

## 2015-04-03 DIAGNOSIS — I639 Cerebral infarction, unspecified: Secondary | ICD-10-CM | POA: Diagnosis not present

## 2015-04-04 DIAGNOSIS — G8104 Flaccid hemiplegia affecting left nondominant side: Secondary | ICD-10-CM | POA: Diagnosis not present

## 2015-04-04 DIAGNOSIS — I639 Cerebral infarction, unspecified: Secondary | ICD-10-CM | POA: Diagnosis not present

## 2015-04-04 DIAGNOSIS — R293 Abnormal posture: Secondary | ICD-10-CM | POA: Diagnosis not present

## 2015-04-04 DIAGNOSIS — G8929 Other chronic pain: Secondary | ICD-10-CM | POA: Diagnosis not present

## 2015-04-04 DIAGNOSIS — M6281 Muscle weakness (generalized): Secondary | ICD-10-CM | POA: Diagnosis not present

## 2015-04-05 DIAGNOSIS — R293 Abnormal posture: Secondary | ICD-10-CM | POA: Diagnosis not present

## 2015-04-05 DIAGNOSIS — G8929 Other chronic pain: Secondary | ICD-10-CM | POA: Diagnosis not present

## 2015-04-05 DIAGNOSIS — M6281 Muscle weakness (generalized): Secondary | ICD-10-CM | POA: Diagnosis not present

## 2015-04-05 DIAGNOSIS — I639 Cerebral infarction, unspecified: Secondary | ICD-10-CM | POA: Diagnosis not present

## 2015-04-05 DIAGNOSIS — G8104 Flaccid hemiplegia affecting left nondominant side: Secondary | ICD-10-CM | POA: Diagnosis not present

## 2015-04-06 DIAGNOSIS — M6281 Muscle weakness (generalized): Secondary | ICD-10-CM | POA: Diagnosis not present

## 2015-04-06 DIAGNOSIS — R293 Abnormal posture: Secondary | ICD-10-CM | POA: Diagnosis not present

## 2015-04-06 DIAGNOSIS — I639 Cerebral infarction, unspecified: Secondary | ICD-10-CM | POA: Diagnosis not present

## 2015-04-06 DIAGNOSIS — G8104 Flaccid hemiplegia affecting left nondominant side: Secondary | ICD-10-CM | POA: Diagnosis not present

## 2015-04-06 DIAGNOSIS — G8929 Other chronic pain: Secondary | ICD-10-CM | POA: Diagnosis not present

## 2015-04-07 DIAGNOSIS — G8929 Other chronic pain: Secondary | ICD-10-CM | POA: Diagnosis not present

## 2015-04-07 DIAGNOSIS — R293 Abnormal posture: Secondary | ICD-10-CM | POA: Diagnosis not present

## 2015-04-07 DIAGNOSIS — I639 Cerebral infarction, unspecified: Secondary | ICD-10-CM | POA: Diagnosis not present

## 2015-04-07 DIAGNOSIS — M6281 Muscle weakness (generalized): Secondary | ICD-10-CM | POA: Diagnosis not present

## 2015-04-07 DIAGNOSIS — G8104 Flaccid hemiplegia affecting left nondominant side: Secondary | ICD-10-CM | POA: Diagnosis not present

## 2015-04-08 ENCOUNTER — Encounter: Payer: Self-pay | Admitting: Internal Medicine

## 2015-04-08 DIAGNOSIS — G8929 Other chronic pain: Secondary | ICD-10-CM | POA: Diagnosis not present

## 2015-04-08 DIAGNOSIS — G8104 Flaccid hemiplegia affecting left nondominant side: Secondary | ICD-10-CM | POA: Diagnosis not present

## 2015-04-08 DIAGNOSIS — R293 Abnormal posture: Secondary | ICD-10-CM | POA: Diagnosis not present

## 2015-04-08 DIAGNOSIS — M6281 Muscle weakness (generalized): Secondary | ICD-10-CM | POA: Diagnosis not present

## 2015-04-08 DIAGNOSIS — N390 Urinary tract infection, site not specified: Secondary | ICD-10-CM | POA: Diagnosis not present

## 2015-04-08 DIAGNOSIS — I639 Cerebral infarction, unspecified: Secondary | ICD-10-CM | POA: Diagnosis not present

## 2015-04-11 DIAGNOSIS — I639 Cerebral infarction, unspecified: Secondary | ICD-10-CM | POA: Diagnosis not present

## 2015-04-11 DIAGNOSIS — M6281 Muscle weakness (generalized): Secondary | ICD-10-CM | POA: Diagnosis not present

## 2015-04-11 DIAGNOSIS — G8929 Other chronic pain: Secondary | ICD-10-CM | POA: Diagnosis not present

## 2015-04-11 DIAGNOSIS — R293 Abnormal posture: Secondary | ICD-10-CM | POA: Diagnosis not present

## 2015-04-11 DIAGNOSIS — G8104 Flaccid hemiplegia affecting left nondominant side: Secondary | ICD-10-CM | POA: Diagnosis not present

## 2015-04-12 DIAGNOSIS — R293 Abnormal posture: Secondary | ICD-10-CM | POA: Diagnosis not present

## 2015-04-12 DIAGNOSIS — G8104 Flaccid hemiplegia affecting left nondominant side: Secondary | ICD-10-CM | POA: Diagnosis not present

## 2015-04-12 DIAGNOSIS — I639 Cerebral infarction, unspecified: Secondary | ICD-10-CM | POA: Diagnosis not present

## 2015-04-12 DIAGNOSIS — M6281 Muscle weakness (generalized): Secondary | ICD-10-CM | POA: Diagnosis not present

## 2015-04-12 DIAGNOSIS — G8929 Other chronic pain: Secondary | ICD-10-CM | POA: Diagnosis not present

## 2015-04-13 ENCOUNTER — Ambulatory Visit (INDEPENDENT_AMBULATORY_CARE_PROVIDER_SITE_OTHER): Payer: Medicare Other | Admitting: *Deleted

## 2015-04-13 ENCOUNTER — Non-Acute Institutional Stay (SKILLED_NURSING_FACILITY): Payer: Medicare Other | Admitting: Internal Medicine

## 2015-04-13 DIAGNOSIS — M25512 Pain in left shoulder: Secondary | ICD-10-CM | POA: Diagnosis not present

## 2015-04-13 DIAGNOSIS — I6990 Unspecified sequelae of unspecified cerebrovascular disease: Secondary | ICD-10-CM

## 2015-04-13 DIAGNOSIS — I639 Cerebral infarction, unspecified: Secondary | ICD-10-CM | POA: Diagnosis not present

## 2015-04-13 DIAGNOSIS — R293 Abnormal posture: Secondary | ICD-10-CM | POA: Diagnosis not present

## 2015-04-13 DIAGNOSIS — M6281 Muscle weakness (generalized): Secondary | ICD-10-CM | POA: Diagnosis not present

## 2015-04-13 DIAGNOSIS — G8929 Other chronic pain: Secondary | ICD-10-CM | POA: Diagnosis not present

## 2015-04-13 DIAGNOSIS — G8104 Flaccid hemiplegia affecting left nondominant side: Secondary | ICD-10-CM | POA: Diagnosis not present

## 2015-04-14 DIAGNOSIS — M6281 Muscle weakness (generalized): Secondary | ICD-10-CM | POA: Diagnosis not present

## 2015-04-14 DIAGNOSIS — G8929 Other chronic pain: Secondary | ICD-10-CM | POA: Diagnosis not present

## 2015-04-14 DIAGNOSIS — G8104 Flaccid hemiplegia affecting left nondominant side: Secondary | ICD-10-CM | POA: Diagnosis not present

## 2015-04-14 DIAGNOSIS — Z578 Occupational exposure to other risk factors: Secondary | ICD-10-CM | POA: Diagnosis not present

## 2015-04-14 DIAGNOSIS — R293 Abnormal posture: Secondary | ICD-10-CM | POA: Diagnosis not present

## 2015-04-14 DIAGNOSIS — I639 Cerebral infarction, unspecified: Secondary | ICD-10-CM | POA: Diagnosis not present

## 2015-04-14 NOTE — Progress Notes (Signed)
Loop recorder 

## 2015-04-15 DIAGNOSIS — G8104 Flaccid hemiplegia affecting left nondominant side: Secondary | ICD-10-CM | POA: Diagnosis not present

## 2015-04-15 DIAGNOSIS — M6281 Muscle weakness (generalized): Secondary | ICD-10-CM | POA: Diagnosis not present

## 2015-04-15 DIAGNOSIS — R293 Abnormal posture: Secondary | ICD-10-CM | POA: Diagnosis not present

## 2015-04-15 DIAGNOSIS — I639 Cerebral infarction, unspecified: Secondary | ICD-10-CM | POA: Diagnosis not present

## 2015-04-15 DIAGNOSIS — G8929 Other chronic pain: Secondary | ICD-10-CM | POA: Diagnosis not present

## 2015-04-18 ENCOUNTER — Non-Acute Institutional Stay (SKILLED_NURSING_FACILITY): Payer: Medicare Other | Admitting: Internal Medicine

## 2015-04-18 DIAGNOSIS — M25512 Pain in left shoulder: Secondary | ICD-10-CM | POA: Diagnosis not present

## 2015-04-18 DIAGNOSIS — I639 Cerebral infarction, unspecified: Secondary | ICD-10-CM | POA: Diagnosis not present

## 2015-04-18 DIAGNOSIS — R293 Abnormal posture: Secondary | ICD-10-CM | POA: Diagnosis not present

## 2015-04-18 DIAGNOSIS — G8104 Flaccid hemiplegia affecting left nondominant side: Secondary | ICD-10-CM | POA: Diagnosis not present

## 2015-04-18 DIAGNOSIS — G8929 Other chronic pain: Secondary | ICD-10-CM | POA: Diagnosis not present

## 2015-04-18 DIAGNOSIS — M6281 Muscle weakness (generalized): Secondary | ICD-10-CM | POA: Diagnosis not present

## 2015-04-18 LAB — CUP PACEART REMOTE DEVICE CHECK: MDC IDC SESS DTM: 20160829102420

## 2015-04-19 DIAGNOSIS — M6281 Muscle weakness (generalized): Secondary | ICD-10-CM | POA: Diagnosis not present

## 2015-04-19 DIAGNOSIS — G8104 Flaccid hemiplegia affecting left nondominant side: Secondary | ICD-10-CM | POA: Diagnosis not present

## 2015-04-19 DIAGNOSIS — G8929 Other chronic pain: Secondary | ICD-10-CM | POA: Diagnosis not present

## 2015-04-19 DIAGNOSIS — I639 Cerebral infarction, unspecified: Secondary | ICD-10-CM | POA: Diagnosis not present

## 2015-04-19 DIAGNOSIS — R293 Abnormal posture: Secondary | ICD-10-CM | POA: Diagnosis not present

## 2015-04-19 NOTE — Progress Notes (Signed)
Patient ID: Dawn Ho, female   DOB: Sep 14, 1950, 64 y.o.   MRN: [id number]                PROGRESS NOTE  DATE:  04/13/2015          FACILITY: [facility]                LEVEL OF CARE:   SNF   Acute Visit             CHIEF COMPLAINT:  ?Left shoulder pain.      HISTORY OF PRESENT ILLNESS:  This is not a new complaint for this patient as she has been complaining about this for quite a while.   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  I did an x-ray of the shoulder last month that showed mild degenerative changes.    She states that she has pain in the left shoulder that radiates down to the left wrist.  In fact, it is not even totally clear to me that it is the shoulder joint she is complaining about as she seems to grip on the lateral rib area.    REVIEW OF SYSTEMS:    CHEST/RESPIRATORY:  She is not complaining of shortness of breath.   CARDIAC:  No chest pain.   GI:  No nausea, vomiting, or abdominal pain.      GU:  She had a urine culture done for some reason that showed greater than 100,000 E.coli.  This is fairly pansensitive.  However, I cannot elicit any symptoms; in particular, no dysuria, no flank pain, no nausea and no vomiting.   MUSCULOSKELETAL:  She is not complaining of any other joint pain.    PHYSICAL EXAMINATION:   CHEST/RESPIRATORY:  Exam is clear.       CARDIOVASCULAR:   CARDIAC:  Heart sounds are normal.  There are no murmurs.    CHEST WALL:  There is marked tenderness along the anterior ribs, lateral ribs under the axilla.     MUSCULOSKELETAL:   EXTREMITIES:   LEFT UPPER EXTREMITY:  She has a frozen shoulder on the left.  The pain seems markedly increased by any range of motion in the shoulder.    ASSESSMENT/PLAN:                 Left shoulder/rib pain.  I believe she was sent to Orthopedics for this, who put her on Baclofen.  The Baclofen at three times a day caused  confusion.  We had to back off on this.  I think she has a frozen shoulder, but I am not exactly sure that the shoulder is the source of all of this.  She is also tender along the ribs.  One would wonder about radicular pain or perhaps a neuropathic pain syndrome post stroke (shoulder-hand syndrome) or thalamic irritation.  I am going to re-x-ray the shoulder and ribs.  I have given her hydrocodone/APAP p.r.n.

## 2015-04-20 DIAGNOSIS — R293 Abnormal posture: Secondary | ICD-10-CM | POA: Diagnosis not present

## 2015-04-20 DIAGNOSIS — G8104 Flaccid hemiplegia affecting left nondominant side: Secondary | ICD-10-CM | POA: Diagnosis not present

## 2015-04-20 DIAGNOSIS — M6281 Muscle weakness (generalized): Secondary | ICD-10-CM | POA: Diagnosis not present

## 2015-04-20 DIAGNOSIS — I639 Cerebral infarction, unspecified: Secondary | ICD-10-CM | POA: Diagnosis not present

## 2015-04-20 DIAGNOSIS — G8929 Other chronic pain: Secondary | ICD-10-CM | POA: Diagnosis not present

## 2015-04-21 DIAGNOSIS — I639 Cerebral infarction, unspecified: Secondary | ICD-10-CM | POA: Diagnosis not present

## 2015-04-21 DIAGNOSIS — R293 Abnormal posture: Secondary | ICD-10-CM | POA: Diagnosis not present

## 2015-04-21 DIAGNOSIS — G8929 Other chronic pain: Secondary | ICD-10-CM | POA: Diagnosis not present

## 2015-04-21 DIAGNOSIS — G8104 Flaccid hemiplegia affecting left nondominant side: Secondary | ICD-10-CM | POA: Diagnosis not present

## 2015-04-23 DIAGNOSIS — G8929 Other chronic pain: Secondary | ICD-10-CM | POA: Diagnosis not present

## 2015-04-23 DIAGNOSIS — G8104 Flaccid hemiplegia affecting left nondominant side: Secondary | ICD-10-CM | POA: Diagnosis not present

## 2015-04-23 DIAGNOSIS — R293 Abnormal posture: Secondary | ICD-10-CM | POA: Diagnosis not present

## 2015-04-23 DIAGNOSIS — I639 Cerebral infarction, unspecified: Secondary | ICD-10-CM | POA: Diagnosis not present

## 2015-04-23 NOTE — Progress Notes (Addendum)
Patient ID: Dawn Ho, female   DOB: Jul 09, 1951, 64 y.o.   MRN: [id number]                PROGRESS NOTE  DATE:  04/18/2015         FACILITY: [facility]                  LEVEL OF CARE:   SNF   Acute Visit                   CHIEF COMPLAINT:  Again, issues with regards to her left arm.    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  There have been innumerable complaints of pain in the left arm, particularly the left shoulder, and now I have been asked to see her because of edema in the left arm.     REVIEW OF SYSTEMS:    CHEST/RESPIRATORY:  No shortness of breath.   CARDIAC:  No chest pain.  No palpitations.   GI:  No nausea or vomiting.    MUSCULOSKELETAL:  Once again, she is complaining of pain in the left shoulder and swelling in her fingers.  Most of the time when I see this woman in her chair, her arm is dependent.     PHYSICAL EXAMINATION:   GENERAL APPEARANCE:  The patient is not in any distress.     CHEST/RESPIRATORY:  Clear air entry bilaterally.    CARDIOVASCULAR:   CARDIAC:  Heart sounds are normal.  There are no murmurs.    MUSCULOSKELETAL:   EXTREMITIES:   LEFT UPPER EXTREMITY:  There is no evidence of an acute arthritis in the left arm or especially the shoulder.  She has very limited range of motion.  Her peripheral pulses are intact at the radial.  She does have some edema in her hand which I think is largely dependent.    ASSESSMENT/PLAN:                 Left shoulder pain.  I think there is a differential to this including osteoarthritis of the shoulder itself, limited range secondary to a stroke.      Dependent edema in the left arm.  I think this is trivial.  There is no evidence of a DVT.

## 2015-04-24 DIAGNOSIS — G8929 Other chronic pain: Secondary | ICD-10-CM | POA: Diagnosis not present

## 2015-04-24 DIAGNOSIS — R293 Abnormal posture: Secondary | ICD-10-CM | POA: Diagnosis not present

## 2015-04-24 DIAGNOSIS — I639 Cerebral infarction, unspecified: Secondary | ICD-10-CM | POA: Diagnosis not present

## 2015-04-24 DIAGNOSIS — G8104 Flaccid hemiplegia affecting left nondominant side: Secondary | ICD-10-CM | POA: Diagnosis not present

## 2015-04-25 DIAGNOSIS — I639 Cerebral infarction, unspecified: Secondary | ICD-10-CM | POA: Diagnosis not present

## 2015-04-25 DIAGNOSIS — G8929 Other chronic pain: Secondary | ICD-10-CM | POA: Diagnosis not present

## 2015-04-25 DIAGNOSIS — R293 Abnormal posture: Secondary | ICD-10-CM | POA: Diagnosis not present

## 2015-04-25 DIAGNOSIS — G8104 Flaccid hemiplegia affecting left nondominant side: Secondary | ICD-10-CM | POA: Diagnosis not present

## 2015-04-26 DIAGNOSIS — R293 Abnormal posture: Secondary | ICD-10-CM | POA: Diagnosis not present

## 2015-04-26 DIAGNOSIS — G8104 Flaccid hemiplegia affecting left nondominant side: Secondary | ICD-10-CM | POA: Diagnosis not present

## 2015-04-26 DIAGNOSIS — G8929 Other chronic pain: Secondary | ICD-10-CM | POA: Diagnosis not present

## 2015-04-26 DIAGNOSIS — I639 Cerebral infarction, unspecified: Secondary | ICD-10-CM | POA: Diagnosis not present

## 2015-04-27 ENCOUNTER — Encounter: Payer: Self-pay | Admitting: Internal Medicine

## 2015-04-27 DIAGNOSIS — I639 Cerebral infarction, unspecified: Secondary | ICD-10-CM | POA: Diagnosis not present

## 2015-04-27 DIAGNOSIS — G8929 Other chronic pain: Secondary | ICD-10-CM | POA: Diagnosis not present

## 2015-04-27 DIAGNOSIS — G8104 Flaccid hemiplegia affecting left nondominant side: Secondary | ICD-10-CM | POA: Diagnosis not present

## 2015-04-27 DIAGNOSIS — R293 Abnormal posture: Secondary | ICD-10-CM | POA: Diagnosis not present

## 2015-04-28 DIAGNOSIS — F339 Major depressive disorder, recurrent, unspecified: Secondary | ICD-10-CM | POA: Diagnosis not present

## 2015-04-28 DIAGNOSIS — I639 Cerebral infarction, unspecified: Secondary | ICD-10-CM | POA: Diagnosis not present

## 2015-04-28 DIAGNOSIS — G8104 Flaccid hemiplegia affecting left nondominant side: Secondary | ICD-10-CM | POA: Diagnosis not present

## 2015-04-28 DIAGNOSIS — G8929 Other chronic pain: Secondary | ICD-10-CM | POA: Diagnosis not present

## 2015-04-28 DIAGNOSIS — R293 Abnormal posture: Secondary | ICD-10-CM | POA: Diagnosis not present

## 2015-04-29 DIAGNOSIS — G8104 Flaccid hemiplegia affecting left nondominant side: Secondary | ICD-10-CM | POA: Diagnosis not present

## 2015-04-29 DIAGNOSIS — I639 Cerebral infarction, unspecified: Secondary | ICD-10-CM | POA: Diagnosis not present

## 2015-04-29 DIAGNOSIS — R293 Abnormal posture: Secondary | ICD-10-CM | POA: Diagnosis not present

## 2015-04-29 DIAGNOSIS — G8929 Other chronic pain: Secondary | ICD-10-CM | POA: Diagnosis not present

## 2015-05-01 DIAGNOSIS — R293 Abnormal posture: Secondary | ICD-10-CM | POA: Diagnosis not present

## 2015-05-01 DIAGNOSIS — I639 Cerebral infarction, unspecified: Secondary | ICD-10-CM | POA: Diagnosis not present

## 2015-05-01 DIAGNOSIS — G8929 Other chronic pain: Secondary | ICD-10-CM | POA: Diagnosis not present

## 2015-05-01 DIAGNOSIS — G8104 Flaccid hemiplegia affecting left nondominant side: Secondary | ICD-10-CM | POA: Diagnosis not present

## 2015-05-02 DIAGNOSIS — G8929 Other chronic pain: Secondary | ICD-10-CM | POA: Diagnosis not present

## 2015-05-02 DIAGNOSIS — G8104 Flaccid hemiplegia affecting left nondominant side: Secondary | ICD-10-CM | POA: Diagnosis not present

## 2015-05-02 DIAGNOSIS — I639 Cerebral infarction, unspecified: Secondary | ICD-10-CM | POA: Diagnosis not present

## 2015-05-02 DIAGNOSIS — R293 Abnormal posture: Secondary | ICD-10-CM | POA: Diagnosis not present

## 2015-05-03 DIAGNOSIS — R293 Abnormal posture: Secondary | ICD-10-CM | POA: Diagnosis not present

## 2015-05-03 DIAGNOSIS — G8929 Other chronic pain: Secondary | ICD-10-CM | POA: Diagnosis not present

## 2015-05-03 DIAGNOSIS — I639 Cerebral infarction, unspecified: Secondary | ICD-10-CM | POA: Diagnosis not present

## 2015-05-03 DIAGNOSIS — G8104 Flaccid hemiplegia affecting left nondominant side: Secondary | ICD-10-CM | POA: Diagnosis not present

## 2015-05-04 DIAGNOSIS — G8104 Flaccid hemiplegia affecting left nondominant side: Secondary | ICD-10-CM | POA: Diagnosis not present

## 2015-05-04 DIAGNOSIS — G8929 Other chronic pain: Secondary | ICD-10-CM | POA: Diagnosis not present

## 2015-05-04 DIAGNOSIS — R293 Abnormal posture: Secondary | ICD-10-CM | POA: Diagnosis not present

## 2015-05-04 DIAGNOSIS — I639 Cerebral infarction, unspecified: Secondary | ICD-10-CM | POA: Diagnosis not present

## 2015-05-05 DIAGNOSIS — R293 Abnormal posture: Secondary | ICD-10-CM | POA: Diagnosis not present

## 2015-05-05 DIAGNOSIS — G8104 Flaccid hemiplegia affecting left nondominant side: Secondary | ICD-10-CM | POA: Diagnosis not present

## 2015-05-05 DIAGNOSIS — I639 Cerebral infarction, unspecified: Secondary | ICD-10-CM | POA: Diagnosis not present

## 2015-05-05 DIAGNOSIS — G8929 Other chronic pain: Secondary | ICD-10-CM | POA: Diagnosis not present

## 2015-05-06 DIAGNOSIS — R293 Abnormal posture: Secondary | ICD-10-CM | POA: Diagnosis not present

## 2015-05-06 DIAGNOSIS — I639 Cerebral infarction, unspecified: Secondary | ICD-10-CM | POA: Diagnosis not present

## 2015-05-06 DIAGNOSIS — G8104 Flaccid hemiplegia affecting left nondominant side: Secondary | ICD-10-CM | POA: Diagnosis not present

## 2015-05-06 DIAGNOSIS — G8929 Other chronic pain: Secondary | ICD-10-CM | POA: Diagnosis not present

## 2015-05-09 DIAGNOSIS — R293 Abnormal posture: Secondary | ICD-10-CM | POA: Diagnosis not present

## 2015-05-09 DIAGNOSIS — G8929 Other chronic pain: Secondary | ICD-10-CM | POA: Diagnosis not present

## 2015-05-09 DIAGNOSIS — I639 Cerebral infarction, unspecified: Secondary | ICD-10-CM | POA: Diagnosis not present

## 2015-05-09 DIAGNOSIS — G8104 Flaccid hemiplegia affecting left nondominant side: Secondary | ICD-10-CM | POA: Diagnosis not present

## 2015-05-10 DIAGNOSIS — I639 Cerebral infarction, unspecified: Secondary | ICD-10-CM | POA: Diagnosis not present

## 2015-05-10 DIAGNOSIS — R293 Abnormal posture: Secondary | ICD-10-CM | POA: Diagnosis not present

## 2015-05-10 DIAGNOSIS — G8929 Other chronic pain: Secondary | ICD-10-CM | POA: Diagnosis not present

## 2015-05-10 DIAGNOSIS — G8104 Flaccid hemiplegia affecting left nondominant side: Secondary | ICD-10-CM | POA: Diagnosis not present

## 2015-05-13 ENCOUNTER — Ambulatory Visit (INDEPENDENT_AMBULATORY_CARE_PROVIDER_SITE_OTHER): Payer: Medicare Other | Admitting: *Deleted

## 2015-05-13 DIAGNOSIS — I639 Cerebral infarction, unspecified: Secondary | ICD-10-CM

## 2015-05-18 DIAGNOSIS — R0781 Pleurodynia: Secondary | ICD-10-CM | POA: Diagnosis not present

## 2015-05-18 DIAGNOSIS — M25512 Pain in left shoulder: Secondary | ICD-10-CM | POA: Diagnosis not present

## 2015-05-19 DIAGNOSIS — E119 Type 2 diabetes mellitus without complications: Secondary | ICD-10-CM | POA: Diagnosis not present

## 2015-05-19 NOTE — Progress Notes (Signed)
Loop recorder 

## 2015-05-25 LAB — CUP PACEART REMOTE DEVICE CHECK: Date Time Interrogation Session: 20160923213518

## 2015-05-25 NOTE — Progress Notes (Signed)
Carelink summary report received. Battery status OK. Normal device function. No new symptom episodes, tachy episodes, brady, or pause episodes. No new AF episodes. Monthly summary reports and ROV w/ JA PRN. 

## 2015-05-26 DIAGNOSIS — G8104 Flaccid hemiplegia affecting left nondominant side: Secondary | ICD-10-CM | POA: Diagnosis not present

## 2015-05-26 DIAGNOSIS — F339 Major depressive disorder, recurrent, unspecified: Secondary | ICD-10-CM | POA: Diagnosis not present

## 2015-05-26 DIAGNOSIS — R293 Abnormal posture: Secondary | ICD-10-CM | POA: Diagnosis not present

## 2015-05-26 DIAGNOSIS — I639 Cerebral infarction, unspecified: Secondary | ICD-10-CM | POA: Diagnosis not present

## 2015-05-26 DIAGNOSIS — G8929 Other chronic pain: Secondary | ICD-10-CM | POA: Diagnosis not present

## 2015-06-06 ENCOUNTER — Non-Acute Institutional Stay (SKILLED_NURSING_FACILITY): Payer: Medicare Other | Admitting: Internal Medicine

## 2015-06-06 DIAGNOSIS — I6990 Unspecified sequelae of unspecified cerebrovascular disease: Secondary | ICD-10-CM

## 2015-06-06 DIAGNOSIS — E1342 Other specified diabetes mellitus with diabetic polyneuropathy: Secondary | ICD-10-CM | POA: Diagnosis not present

## 2015-06-06 DIAGNOSIS — F331 Major depressive disorder, recurrent, moderate: Secondary | ICD-10-CM

## 2015-06-09 NOTE — Progress Notes (Addendum)
Patient ID: Dawn Ho, female   DOB: 21-Nov-1950, 64 y.o.   MRN: [id number]                PROGRESS NOTE  DATE:  06/06/2015          FACILITY: [facility]                   LEVEL OF CARE:   SNF   Routine Visit           CHIEF COMPLAINT:  Follow up medical issues.      HISTORY OF PRESENT ILLNESS:  This is a patient who had a series of right hemisphere strokes with the most recent being on November 17, 2014.  She had left arm and left leg weakness.  The last of these strokes was while on Plavix and aspirin.  I have, therefore, elected to keep her on Coumadin.    Her other medical issues include:    Type 2 diabetes.  On insulin.      Major depression.  Currently much better.    Multiple cerebral infarctions state.    Hypothyroidism.  On replacement.    Gastroesophageal reflux disease.    In preparation for what was thought to be extractions, she went to see either a dentist or an oral surgeon earlier this month.  Apparently, no extractions were done.  The Coumadin was not restarted.  I have stumbled on this today.  Her recent therapeutic dose seemed to be 5.5 mg.     PAST MEDICAL HISTORY/PROBLEM LIST:   Reviewed.      CURRENT MEDICATIONS:  Medication list is reviewed.       Toprol XL 50 mg daily.     Lipitor 20 q.d.     Lasix 20 mg, 3 tablets/60 mg daily.     Synthroid 125 daily.     Cozaar 100 q.d.      Protonix 40 q.d.     K-Dur 20 mEq daily.    Zoloft 50 q.d.     Baclofen 10 mg twice daily.    Neurontin 300 twice daily.    Tylenol 500 mg, 2 tablets/1000 mg b.i.d.      Ativan 0.5 b.i.d.      Norco 5/325, 1 tablet every 12 hours.     Vitamin B12, 1000 mcg monthly.      REVIEW OF SYSTEMS:    HEENT:   The patient does not complain of headache or blurred vision.    CHEST/RESPIRATORY:  No shortness of breath.  No wheezing.        CARDIAC:  No chest pain.  No palpitations.    GI:  No nausea, vomiting, or diarrhea.       GU:  No dysuria.      MUSCULOSKELETAL:  Extremities:  States she has a frozen shoulder on the left.  States this is much better on the increased dose of her pain medications.   PSYCHIATRIC:  Mental status:  Denies depression.       PHYSICAL EXAMINATION:   GENERAL APPEARANCE:  The patient looks to be in no distress.  Bright, cheerful.    HEENT:   MOUTH/THROAT:  Oral exam is normal.     CHEST/RESPIRATORY:  Exam is clear.        CARDIOVASCULAR:   CARDIAC:  Heart sounds are normal.  There are no murmurs.   No bruits.     GASTROINTESTINAL:   ABDOMEN:  Soft.     LIVER/SPLEEN/KIDNEYS:  No liver, no spleen.   MUSCULOSKELETAL:  No active joints.     NEUROLOGICAL:    SENSATION/STRENGTH:  Her left arm and left leg strength is much improved.  In fact, she is almost able to bring herself to a standing position.    ASSESSMENT/PLAN:                  Recurrent right hemisphere CVAs/multi-infarct state.  I am going to restart her Coumadin.  I am a bit perturbed that she is not on anything.  However, I am not going to anticoagulate her acutely.  Start her back on 5.5 mg.    Type 2 diabetes.  Lantus insulin 65 U at night.   Humulin 08/03/14.   Her blood sugars are really quite stable.  Her hemoglobin A1c was 7.  I do not think we need any finer control than that.    Hypertension.   Again, this seems to be under decent control.  She is on a fairly large dose of Lasix.  I am not sure why she needs this.  Nevertheless, she appears to be tolerating it.    Major depression after her most recent stroke earlier this year.  She has done very well on Zoloft 50 mg.  I will not consider tapering this until the spring of 2017.

## 2015-06-10 ENCOUNTER — Encounter: Payer: Self-pay | Admitting: Internal Medicine

## 2015-06-10 DIAGNOSIS — N390 Urinary tract infection, site not specified: Secondary | ICD-10-CM | POA: Diagnosis not present

## 2015-06-13 ENCOUNTER — Ambulatory Visit (INDEPENDENT_AMBULATORY_CARE_PROVIDER_SITE_OTHER): Payer: Medicare Other | Admitting: *Deleted

## 2015-06-13 DIAGNOSIS — I638 Other cerebral infarction: Secondary | ICD-10-CM

## 2015-06-13 DIAGNOSIS — I6389 Other cerebral infarction: Secondary | ICD-10-CM

## 2015-06-14 NOTE — Progress Notes (Signed)
Loop recorder 

## 2015-07-12 ENCOUNTER — Ambulatory Visit (INDEPENDENT_AMBULATORY_CARE_PROVIDER_SITE_OTHER): Payer: Medicare Other | Admitting: *Deleted

## 2015-07-12 DIAGNOSIS — I638 Other cerebral infarction: Secondary | ICD-10-CM | POA: Diagnosis not present

## 2015-07-12 DIAGNOSIS — I6389 Other cerebral infarction: Secondary | ICD-10-CM

## 2015-07-13 NOTE — Progress Notes (Signed)
Carelink Summary Report / Loop recorder 

## 2015-07-15 LAB — CUP PACEART REMOTE DEVICE CHECK: MDC IDC SESS DTM: 20161023220548

## 2015-07-15 NOTE — Progress Notes (Signed)
Carelink summary report received. Battery status OK. Normal device function. No new symptom episodes, tachy episodes, brady, or pause episodes. No new AF episodes. Monthly summary reports and ROV with JA PRN. 

## 2015-07-26 ENCOUNTER — Ambulatory Visit: Payer: Medicare Other | Admitting: Neurology

## 2015-07-27 ENCOUNTER — Non-Acute Institutional Stay (SKILLED_NURSING_FACILITY): Payer: Medicare Other | Admitting: Internal Medicine

## 2015-07-27 DIAGNOSIS — I480 Paroxysmal atrial fibrillation: Secondary | ICD-10-CM

## 2015-07-27 DIAGNOSIS — I6990 Unspecified sequelae of unspecified cerebrovascular disease: Secondary | ICD-10-CM | POA: Diagnosis not present

## 2015-07-28 ENCOUNTER — Encounter: Payer: Self-pay | Admitting: Neurology

## 2015-07-31 NOTE — Progress Notes (Addendum)
Patient ID: Dawn Ho, female   DOB: 23-Dec-1950, 64 y.o.   MRN: [id number]                PROGRESS NOTE  DATE:  07/27/2015       FACILITY: [facility]              LEVEL OF CARE:   SNF   Routine Visit        CHIEF COMPLAINT:  Routine visit to follow medical issues.      HISTORY OF PRESENT ILLNESS:  This is a patient who has had a series of right hemisphere strokes with the most recent being on November 17, 2014.  This left her with very significant left arm and left leg weakness.   The left leg has largely recovered, although she still has significant weakness of the left arm.  The last of these strokes is why she was on Plavix and aspirin.  Therefore, I have elected to keep her on Coumadin.    PAST MEDICAL HISTORY/PROBLEM LIST:   Her other medical issues include:    Type 2 diabetes, on insulin.    Major depression, currently much, much better.    Multiple cerebral infarctions.    Hypothyroidism.  On replacement.    Gastroesophageal reflux.    CURRENT MEDICATIONS:  Medication list is reviewed.           Toprol XL 50.    Lipitor 20 q.d.      Lasix 60 q.d.      Cozaar 100 q.d.      Protonix 40 q.d.      K-Dur 20 mEq daily.     Zoloft 50 q.d.      Baclofen 10 mg twice daily.    Neurontin 300 twice daily.     Tylenol 1000 b.i.d.      Norco 5/325, 1 tablet every 12 hours.    Vitamin B12, 1000 monthly.      Coumadin 5.5.     REVIEW OF SYSTEMS:    HEENT:   No headache or blurred vision.   CHEST/RESPIRATORY:  No shortness of breath.   CARDIAC:  No chest pain.   GI:  No abdominal pain.    GU:  No dysuria.    MUSCULOSKELETAL:  She has a frozen shoulder on the left which was really problematic for her in terms of pain in the left arm.  However, this seems much better on increased doses of narcotics.   PSYCHIATRIC:  Denies depression.      PHYSICAL EXAMINATION:   VITAL SIGNS:     TEMPERATURE:  98.    PULSE:  74.     RESPIRATIONS:  18.     BLOOD  PRESSURE:  160/80.     WEIGHT:  220 pounds, which is largely stable.    CHEST/RESPIRATORY:  Clear air entry bilaterally.    CARDIOVASCULAR:   CARDIAC:  Heart sounds are normal.  There are no murmurs.  There are no bruits.   GASTROINTESTINAL:   ABDOMEN:  Obese.  No masses.    LIVER/SPLEEN/KIDNEYS:  No liver, no spleen.   MUSCULOSKELETAL:  No active joints.    NEUROLOGICAL:    SENSATION/STRENGTH:  Her left arm strength has improved.  In fact, I think she is able to take a few short steps.      ASSESSMENT/PLAN:                 Recurrent right hemisphere CVAs, multi-infarct state.  She  is on Coumadin at 6 mg a day. afib  The patient is complaining about a UTI.  Complaining of burning when she voids.  I am going to check a urine out of concern for a UTI, but no empiric antibiotics at this point.    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  All of this is considerably improved.

## 2015-08-11 ENCOUNTER — Ambulatory Visit (INDEPENDENT_AMBULATORY_CARE_PROVIDER_SITE_OTHER): Payer: Medicare Other | Admitting: *Deleted

## 2015-08-11 DIAGNOSIS — I638 Other cerebral infarction: Secondary | ICD-10-CM | POA: Diagnosis not present

## 2015-08-11 DIAGNOSIS — I6389 Other cerebral infarction: Secondary | ICD-10-CM

## 2015-08-12 NOTE — Progress Notes (Signed)
Carelink Summary Report / Loop Recorder 

## 2015-08-18 LAB — CUP PACEART REMOTE DEVICE CHECK: Date Time Interrogation Session: 20161122220745

## 2015-09-12 ENCOUNTER — Ambulatory Visit (INDEPENDENT_AMBULATORY_CARE_PROVIDER_SITE_OTHER): Payer: Medicare Other | Admitting: *Deleted

## 2015-09-12 DIAGNOSIS — I6389 Other cerebral infarction: Secondary | ICD-10-CM

## 2015-09-12 DIAGNOSIS — I638 Other cerebral infarction: Secondary | ICD-10-CM

## 2015-09-13 NOTE — Progress Notes (Signed)
Carelink Summary Report / Loop Recorder 

## 2015-09-24 LAB — CUP PACEART REMOTE DEVICE CHECK: MDC IDC SESS DTM: 20161222223529

## 2015-09-27 ENCOUNTER — Other Ambulatory Visit: Payer: Self-pay | Admitting: *Deleted

## 2015-09-27 MED ORDER — LORAZEPAM 0.5 MG PO TABS: ORAL_TABLET | ORAL | Status: DC

## 2015-09-27 NOTE — Telephone Encounter (Signed)
[facility] 

## 2015-09-30 IMAGING — CT CT ANGIO NECK
1 of 12 series · 1 of 34 positions shown · IV contrast (Iohexol (Omnipaque 350))
Comparison: MRI head 11/17/2014

CLINICAL DATA: Stroke.

EXAM:
CT ANGIOGRAPHY NECK
TECHNIQUE: Multidetector CT imaging of the neck was performed using the
standard protocol during bolus administration of intravenous
contrast. Multiplanar CT image reconstructions and MIPs were
obtained to evaluate the vascular anatomy. Carotid stenosis
measurements (when applicable) are obtained utilizing NASCET
criteria, using the distal internal carotid diameter as the
denominator.
CONTRAST:  100mL OMNIPAQUE IOHEXOL 350 MG/ML SOLN

[Series 200: locator · axial · 0.49mm/px · 1 of 1 slices shown]
[im 1/1  soft-tissue]
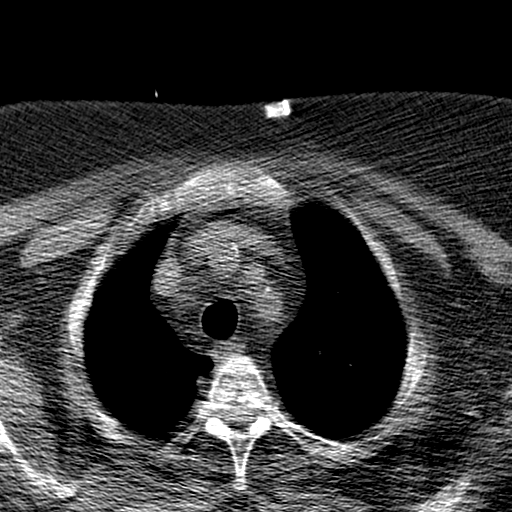

[1 of 34 positions shown; findings below may reference images not displayed]

FINDINGS: Aortic arch: Minimal atherosclerotic disease in the aortic arch.
Three vessel aortic arch. Proximal great vessels widely patent.

Right carotid system: Right common carotid artery widely patent.
Atherosclerotic plaque in the carotid bulb with irregularity and
probable ulcerated plaque. This could be a source of emboli. No
filling defect. 25% diameter stenosis of the right internal carotid
artery. Right external carotid artery widely patent.

Left carotid system: Left common carotid artery widely patent. Mild
atherosclerotic calcification the carotid bulb without significant
stenosis

Vertebral arteries:Vertebral arteries are equal in size. Both
vertebral arteries are patent to the basilar. Atherosclerotic plaque
in the distal vertebral artery causing mild stenosis bilaterally.

Skeleton: Negative

Other neck: Negative for mass or adenopathy in the neck. Lung apices
are clear.
IMPRESSION: 25% diameter stenosis of the proximal right internal carotid artery.
Irregular plaque with possible ulceration. Potential source of
emboli

Mild atherosclerotic disease of the proximal left internal carotid
artery without significant stenosis.

Mild stenosis distal vertebral artery bilaterally secondary to
atherosclerotic disease.

## 2015-10-10 ENCOUNTER — Ambulatory Visit (INDEPENDENT_AMBULATORY_CARE_PROVIDER_SITE_OTHER): Payer: Medicare Other | Admitting: *Deleted

## 2015-10-10 DIAGNOSIS — I638 Other cerebral infarction: Secondary | ICD-10-CM

## 2015-10-10 DIAGNOSIS — I6389 Other cerebral infarction: Secondary | ICD-10-CM

## 2015-10-12 NOTE — Progress Notes (Signed)
Carelink Summary Report / Loop Recorder 

## 2015-10-24 LAB — CUP PACEART REMOTE DEVICE CHECK: Date Time Interrogation Session: 20170121223614

## 2015-10-24 NOTE — Progress Notes (Signed)
Carelink summary report received. Battery status OK. Normal device function. No new symptom episodes, tachy episodes, brady, or pause episodes. No new AF episodes. Monthly summary reports and ROV/PRN 

## 2015-10-27 LAB — CUP PACEART REMOTE DEVICE CHECK: Date Time Interrogation Session: 20170220230606

## 2015-10-27 NOTE — Progress Notes (Signed)
Carelink summary report received. Battery status OK. Normal device function. No new symptom episodes, tachy episodes, brady, or pause episodes. No new AF episodes. Monthly summary reports and ROV/PRN 

## 2015-11-09 ENCOUNTER — Ambulatory Visit (INDEPENDENT_AMBULATORY_CARE_PROVIDER_SITE_OTHER): Payer: Medicare Other | Admitting: *Deleted

## 2015-11-09 DIAGNOSIS — I638 Other cerebral infarction: Secondary | ICD-10-CM

## 2015-11-09 DIAGNOSIS — I6389 Other cerebral infarction: Secondary | ICD-10-CM

## 2015-11-10 NOTE — Progress Notes (Signed)
Carelink Summary Report / Loop Recorder 

## 2015-11-11 ENCOUNTER — Telehealth: Payer: Self-pay | Admitting: Cardiology

## 2015-11-11 NOTE — Telephone Encounter (Signed)
LMOVM requesting that pt send a manual transmission b/c her device has not updated in at least 14 days.

## 2015-11-18 ENCOUNTER — Encounter: Payer: Self-pay | Admitting: Cardiology

## 2015-11-29 ENCOUNTER — Encounter: Payer: Self-pay | Admitting: Cardiology

## 2015-12-09 ENCOUNTER — Encounter: Payer: Medicare Other | Admitting: *Deleted

## 2016-01-14 LAB — CUP PACEART REMOTE DEVICE CHECK: Date Time Interrogation Session: 20170322233551

## 2016-01-14 NOTE — Progress Notes (Signed)
Carelink summary report received. Battery status OK. Normal device function. No new symptom episodes, tachy episodes, brady, or pause episodes. No new AF episodes. Monthly summary reports and ROV/PRN 

## 2016-08-23 ENCOUNTER — Other Ambulatory Visit (HOSPITAL_COMMUNITY): Payer: Self-pay | Admitting: Nurse Practitioner

## 2016-08-23 DIAGNOSIS — D72829 Elevated white blood cell count, unspecified: Secondary | ICD-10-CM

## 2016-08-23 DIAGNOSIS — R4182 Altered mental status, unspecified: Secondary | ICD-10-CM

## 2016-08-23 DIAGNOSIS — R410 Disorientation, unspecified: Secondary | ICD-10-CM

## 2016-08-30 ENCOUNTER — Ambulatory Visit (HOSPITAL_COMMUNITY)
Admission: RE | Admit: 2016-08-30 | Discharge: 2016-08-30 | Disposition: A | Discharge status: Home / Self Care | Payer: Medicare Other | Source: Ambulatory Visit | Attending: Nurse Practitioner | Admitting: Nurse Practitioner

## 2016-08-30 ENCOUNTER — Other Ambulatory Visit (HOSPITAL_COMMUNITY): Payer: Self-pay | Admitting: Nurse Practitioner

## 2016-08-30 DIAGNOSIS — Z90710 Acquired absence of both cervix and uterus: Secondary | ICD-10-CM | POA: Insufficient documentation

## 2016-08-30 DIAGNOSIS — R4182 Altered mental status, unspecified: Secondary | ICD-10-CM

## 2016-08-30 DIAGNOSIS — D72829 Elevated white blood cell count, unspecified: Secondary | ICD-10-CM | POA: Diagnosis not present

## 2016-08-30 DIAGNOSIS — Z9049 Acquired absence of other specified parts of digestive tract: Secondary | ICD-10-CM | POA: Diagnosis not present

## 2016-08-30 DIAGNOSIS — R410 Disorientation, unspecified: Secondary | ICD-10-CM | POA: Diagnosis present

## 2016-08-30 DIAGNOSIS — I739 Peripheral vascular disease, unspecified: Secondary | ICD-10-CM | POA: Diagnosis not present

## 2016-09-12 ENCOUNTER — Ambulatory Visit: Payer: Medicare Other | Admitting: Urology

## 2016-10-12 ENCOUNTER — Ambulatory Visit (INDEPENDENT_AMBULATORY_CARE_PROVIDER_SITE_OTHER): Payer: Medicare Other | Admitting: Urology

## 2016-10-12 DIAGNOSIS — Z87442 Personal history of urinary calculi: Secondary | ICD-10-CM | POA: Diagnosis not present

## 2017-10-31 ENCOUNTER — Encounter (HOSPITAL_COMMUNITY): Payer: Self-pay | Admitting: Emergency Medicine

## 2017-10-31 ENCOUNTER — Emergency Department (HOSPITAL_COMMUNITY): Payer: Medicare Other

## 2017-10-31 ENCOUNTER — Other Ambulatory Visit: Payer: Self-pay

## 2017-10-31 ENCOUNTER — Inpatient Hospital Stay (HOSPITAL_COMMUNITY)
Admission: EM | Admit: 2017-10-31 | Discharge: 2017-11-14 | DRG: 689 | Disposition: A | Discharge status: Skilled Nursing Facility | Payer: Medicare Other | Attending: Internal Medicine | Admitting: Internal Medicine

## 2017-10-31 DIAGNOSIS — Z8249 Family history of ischemic heart disease and other diseases of the circulatory system: Secondary | ICD-10-CM

## 2017-10-31 DIAGNOSIS — I110 Hypertensive heart disease with heart failure: Secondary | ICD-10-CM | POA: Diagnosis present

## 2017-10-31 DIAGNOSIS — I639 Cerebral infarction, unspecified: Secondary | ICD-10-CM

## 2017-10-31 DIAGNOSIS — Z79890 Hormone replacement therapy (postmenopausal): Secondary | ICD-10-CM

## 2017-10-31 DIAGNOSIS — J690 Pneumonitis due to inhalation of food and vomit: Secondary | ICD-10-CM | POA: Diagnosis not present

## 2017-10-31 DIAGNOSIS — E870 Hyperosmolality and hypernatremia: Secondary | ICD-10-CM | POA: Diagnosis not present

## 2017-10-31 DIAGNOSIS — E876 Hypokalemia: Secondary | ICD-10-CM | POA: Diagnosis not present

## 2017-10-31 DIAGNOSIS — Z993 Dependence on wheelchair: Secondary | ICD-10-CM

## 2017-10-31 DIAGNOSIS — Z794 Long term (current) use of insulin: Secondary | ICD-10-CM

## 2017-10-31 DIAGNOSIS — E7800 Pure hypercholesterolemia, unspecified: Secondary | ICD-10-CM

## 2017-10-31 DIAGNOSIS — E119 Type 2 diabetes mellitus without complications: Secondary | ICD-10-CM

## 2017-10-31 DIAGNOSIS — R0989 Other specified symptoms and signs involving the circulatory and respiratory systems: Secondary | ICD-10-CM | POA: Diagnosis not present

## 2017-10-31 DIAGNOSIS — E860 Dehydration: Secondary | ICD-10-CM | POA: Diagnosis present

## 2017-10-31 DIAGNOSIS — R140 Abdominal distension (gaseous): Secondary | ICD-10-CM | POA: Diagnosis not present

## 2017-10-31 DIAGNOSIS — K56609 Unspecified intestinal obstruction, unspecified as to partial versus complete obstruction: Secondary | ICD-10-CM

## 2017-10-31 DIAGNOSIS — N179 Acute kidney failure, unspecified: Secondary | ICD-10-CM | POA: Diagnosis present

## 2017-10-31 DIAGNOSIS — I452 Bifascicular block: Secondary | ICD-10-CM | POA: Diagnosis present

## 2017-10-31 DIAGNOSIS — J9811 Atelectasis: Secondary | ICD-10-CM | POA: Diagnosis not present

## 2017-10-31 DIAGNOSIS — G8110 Spastic hemiplegia affecting unspecified side: Secondary | ICD-10-CM

## 2017-10-31 DIAGNOSIS — I5032 Chronic diastolic (congestive) heart failure: Secondary | ICD-10-CM | POA: Diagnosis present

## 2017-10-31 DIAGNOSIS — Z880 Allergy status to penicillin: Secondary | ICD-10-CM

## 2017-10-31 DIAGNOSIS — J9601 Acute respiratory failure with hypoxia: Secondary | ICD-10-CM | POA: Diagnosis not present

## 2017-10-31 DIAGNOSIS — Z9049 Acquired absence of other specified parts of digestive tract: Secondary | ICD-10-CM

## 2017-10-31 DIAGNOSIS — F418 Other specified anxiety disorders: Secondary | ICD-10-CM | POA: Diagnosis present

## 2017-10-31 DIAGNOSIS — M797 Fibromyalgia: Secondary | ICD-10-CM | POA: Diagnosis present

## 2017-10-31 DIAGNOSIS — Z91041 Radiographic dye allergy status: Secondary | ICD-10-CM

## 2017-10-31 DIAGNOSIS — R0603 Acute respiratory distress: Secondary | ICD-10-CM | POA: Diagnosis not present

## 2017-10-31 DIAGNOSIS — I69354 Hemiplegia and hemiparesis following cerebral infarction affecting left non-dominant side: Secondary | ICD-10-CM

## 2017-10-31 DIAGNOSIS — I6340 Cerebral infarction due to embolism of unspecified cerebral artery: Secondary | ICD-10-CM | POA: Diagnosis not present

## 2017-10-31 DIAGNOSIS — R197 Diarrhea, unspecified: Secondary | ICD-10-CM | POA: Diagnosis present

## 2017-10-31 DIAGNOSIS — K56600 Partial intestinal obstruction, unspecified as to cause: Secondary | ICD-10-CM | POA: Diagnosis not present

## 2017-10-31 DIAGNOSIS — E0810 Diabetes mellitus due to underlying condition with ketoacidosis without coma: Secondary | ICD-10-CM

## 2017-10-31 DIAGNOSIS — T17908A Unspecified foreign body in respiratory tract, part unspecified causing other injury, initial encounter: Secondary | ICD-10-CM | POA: Diagnosis present

## 2017-10-31 DIAGNOSIS — Z6841 Body Mass Index (BMI) 40.0 and over, adult: Secondary | ICD-10-CM

## 2017-10-31 DIAGNOSIS — Z7902 Long term (current) use of antithrombotics/antiplatelets: Secondary | ICD-10-CM

## 2017-10-31 DIAGNOSIS — K567 Ileus, unspecified: Secondary | ICD-10-CM | POA: Diagnosis present

## 2017-10-31 DIAGNOSIS — N390 Urinary tract infection, site not specified: Secondary | ICD-10-CM | POA: Diagnosis not present

## 2017-10-31 DIAGNOSIS — Z90710 Acquired absence of both cervix and uterus: Secondary | ICD-10-CM

## 2017-10-31 DIAGNOSIS — Z79899 Other long term (current) drug therapy: Secondary | ICD-10-CM

## 2017-10-31 DIAGNOSIS — E785 Hyperlipidemia, unspecified: Secondary | ICD-10-CM | POA: Diagnosis present

## 2017-10-31 DIAGNOSIS — B9620 Unspecified Escherichia coli [E. coli] as the cause of diseases classified elsewhere: Secondary | ICD-10-CM | POA: Diagnosis present

## 2017-10-31 DIAGNOSIS — B9730 Unspecified retrovirus as the cause of diseases classified elsewhere: Secondary | ICD-10-CM | POA: Diagnosis present

## 2017-10-31 DIAGNOSIS — D72829 Elevated white blood cell count, unspecified: Secondary | ICD-10-CM | POA: Diagnosis present

## 2017-10-31 DIAGNOSIS — E872 Acidosis: Secondary | ICD-10-CM | POA: Diagnosis present

## 2017-10-31 DIAGNOSIS — N3000 Acute cystitis without hematuria: Secondary | ICD-10-CM | POA: Diagnosis not present

## 2017-10-31 DIAGNOSIS — G9341 Metabolic encephalopathy: Secondary | ICD-10-CM | POA: Diagnosis present

## 2017-10-31 LAB — CBC WITH DIFFERENTIAL/PLATELET
BASOS ABS: 0 10*3/uL (ref 0.0–0.1)
BASOS PCT: 0 %
Eosinophils Absolute: 0.2 10*3/uL (ref 0.0–0.7)
Eosinophils Relative: 2 %
HEMATOCRIT: 40.8 % (ref 36.0–46.0)
Hemoglobin: 12.9 g/dL (ref 12.0–15.0)
LYMPHS PCT: 14 %
Lymphs Abs: 1.9 10*3/uL (ref 0.7–4.0)
MCH: 28.3 pg (ref 26.0–34.0)
MCHC: 31.6 g/dL (ref 30.0–36.0)
MCV: 89.5 fL (ref 78.0–100.0)
Monocytes Absolute: 0.9 10*3/uL (ref 0.1–1.0)
Monocytes Relative: 7 %
NEUTROS ABS: 10.2 10*3/uL — AB (ref 1.7–7.7)
Neutrophils Relative %: 77 %
Platelets: 264 10*3/uL (ref 150–400)
RBC: 4.56 MIL/uL (ref 3.87–5.11)
RDW: 14.9 % (ref 11.5–15.5)
WBC: 13.3 10*3/uL — AB (ref 4.0–10.5)

## 2017-10-31 LAB — URINALYSIS, ROUTINE W REFLEX MICROSCOPIC
BILIRUBIN URINE: NEGATIVE
Glucose, UA: NEGATIVE mg/dL
Hgb urine dipstick: NEGATIVE
KETONES UR: NEGATIVE mg/dL
Nitrite: POSITIVE — AB
Protein, ur: NEGATIVE mg/dL
Specific Gravity, Urine: 1.009 (ref 1.005–1.030)
pH: 5 (ref 5.0–8.0)

## 2017-10-31 LAB — MRSA PCR SCREENING: MRSA by PCR: POSITIVE — AB

## 2017-10-31 LAB — COMPREHENSIVE METABOLIC PANEL
ALBUMIN: 3.3 g/dL — AB (ref 3.5–5.0)
ALT: 13 U/L — AB (ref 14–54)
AST: 15 U/L (ref 15–41)
Alkaline Phosphatase: 70 U/L (ref 38–126)
Anion gap: 16 — ABNORMAL HIGH (ref 5–15)
BILIRUBIN TOTAL: 1 mg/dL (ref 0.3–1.2)
BUN: 36 mg/dL — AB (ref 6–20)
CO2: 20 mmol/L — ABNORMAL LOW (ref 22–32)
CREATININE: 1.55 mg/dL — AB (ref 0.44–1.00)
Calcium: 9.2 mg/dL (ref 8.9–10.3)
Chloride: 108 mmol/L (ref 101–111)
GFR calc Af Amer: 39 mL/min — ABNORMAL LOW (ref >60)
GFR, EST NON AFRICAN AMERICAN: 34 mL/min — AB (ref >60)
GLUCOSE: 201 mg/dL — AB (ref 65–99)
POTASSIUM: 2.6 mmol/L — AB (ref 3.5–5.1)
Sodium: 144 mmol/L (ref 135–145)
TOTAL PROTEIN: 7.3 g/dL (ref 6.5–8.1)

## 2017-10-31 LAB — HEMOGLOBIN A1C
HEMOGLOBIN A1C: 7 % — AB (ref 4.8–5.6)
MEAN PLASMA GLUCOSE: 154.2 mg/dL

## 2017-10-31 LAB — RAPID URINE DRUG SCREEN, HOSP PERFORMED
Amphetamines: NOT DETECTED
BENZODIAZEPINES: NOT DETECTED
Barbiturates: NOT DETECTED
COCAINE: NOT DETECTED
Opiates: NOT DETECTED
Tetrahydrocannabinol: NOT DETECTED

## 2017-10-31 LAB — GLUCOSE, CAPILLARY
GLUCOSE-CAPILLARY: 280 mg/dL — AB (ref 65–99)
Glucose-Capillary: 235 mg/dL — ABNORMAL HIGH (ref 65–99)

## 2017-10-31 LAB — TROPONIN I: Troponin I: <0.03 ng/mL (ref <0.03)

## 2017-10-31 MED ORDER — BACLOFEN 10 MG PO TABS
5.0000 mg | ORAL_TABLET | Freq: Two times a day (BID) | ORAL | Status: DC
  Administered 2017-10-31 – 2017-11-14 (×23): 5 mg via ORAL
  Filled 2017-10-31 (×33): qty 1

## 2017-10-31 MED ORDER — POTASSIUM CHLORIDE 10 MEQ/100ML IV SOLN
10.0000 meq | Freq: Once | INTRAVENOUS | Status: AC
  Administered 2017-10-31: 10 meq via INTRAVENOUS
  Filled 2017-10-31: qty 100

## 2017-10-31 MED ORDER — CIPROFLOXACIN IN D5W 400 MG/200ML IV SOLN
400.0000 mg | Freq: Once | INTRAVENOUS | Status: AC
  Administered 2017-10-31: 400 mg via INTRAVENOUS
  Filled 2017-10-31: qty 200

## 2017-10-31 MED ORDER — METRONIDAZOLE IN NACL 5-0.79 MG/ML-% IV SOLN
500.0000 mg | Freq: Three times a day (TID) | INTRAVENOUS | Status: DC
  Administered 2017-10-31 – 2017-11-04 (×12): 500 mg via INTRAVENOUS
  Filled 2017-10-31 (×12): qty 100

## 2017-10-31 MED ORDER — MUPIROCIN 2 % EX OINT
1.0000 "application " | TOPICAL_OINTMENT | Freq: Two times a day (BID) | CUTANEOUS | Status: AC
  Administered 2017-10-31 – 2017-11-05 (×10): 1 via NASAL
  Filled 2017-10-31 (×2): qty 22

## 2017-10-31 MED ORDER — PANTOPRAZOLE SODIUM 40 MG PO TBEC
40.0000 mg | DELAYED_RELEASE_TABLET | Freq: Every day | ORAL | Status: DC
  Administered 2017-10-31 – 2017-11-05 (×6): 40 mg via ORAL
  Filled 2017-10-31 (×7): qty 1

## 2017-10-31 MED ORDER — METOPROLOL SUCCINATE ER 50 MG PO TB24
50.0000 mg | ORAL_TABLET | Freq: Every day | ORAL | Status: DC
  Administered 2017-10-31 – 2017-11-14 (×11): 50 mg via ORAL
  Filled 2017-10-31 (×15): qty 1

## 2017-10-31 MED ORDER — MAGNESIUM SULFATE IN D5W 1-5 GM/100ML-% IV SOLN
1.0000 g | Freq: Once | INTRAVENOUS | Status: AC
  Administered 2017-10-31: 1 g via INTRAVENOUS
  Filled 2017-10-31: qty 100

## 2017-10-31 MED ORDER — POTASSIUM CHLORIDE 10 MEQ/100ML IV SOLN
10.0000 meq | Freq: Once | INTRAVENOUS | Status: AC
  Administered 2017-10-31: 10 meq via INTRAVENOUS

## 2017-10-31 MED ORDER — POLYETHYLENE GLYCOL 3350 17 G PO PACK: 17.0000 g | PACK | Freq: Every day | ORAL | Status: DC | PRN

## 2017-10-31 MED ORDER — CEFTRIAXONE SODIUM 1 G IJ SOLR
1.0000 g | INTRAMUSCULAR | Status: AC
  Administered 2017-10-31 – 2017-11-02 (×3): 1 g via INTRAVENOUS
  Filled 2017-10-31: qty 10
  Filled 2017-10-31 (×2): qty 1

## 2017-10-31 MED ORDER — CLOPIDOGREL BISULFATE 75 MG PO TABS
75.0000 mg | ORAL_TABLET | Freq: Every day | ORAL | Status: DC
  Administered 2017-10-31 – 2017-11-14 (×11): 75 mg via ORAL
  Filled 2017-10-31 (×13): qty 1

## 2017-10-31 MED ORDER — POTASSIUM CHLORIDE CRYS ER 20 MEQ PO TBCR: 40.0000 meq | EXTENDED_RELEASE_TABLET | Freq: Once | ORAL | Status: DC

## 2017-10-31 MED ORDER — CYANOCOBALAMIN 1000 MCG/ML IJ SOLN
1000.0000 ug | INTRAMUSCULAR | Status: DC
  Administered 2017-11-11: 1000 ug via INTRAMUSCULAR
  Filled 2017-10-31: qty 1

## 2017-10-31 MED ORDER — CHLORHEXIDINE GLUCONATE CLOTH 2 % EX PADS
6.0000 | MEDICATED_PAD | Freq: Every day | CUTANEOUS | Status: AC
  Administered 2017-11-01 – 2017-11-05 (×5): 6 via TOPICAL

## 2017-10-31 MED ORDER — SODIUM CHLORIDE 0.9 % IV BOLUS (SEPSIS)
500.0000 mL | Freq: Once | INTRAVENOUS | Status: AC
  Administered 2017-10-31: 500 mL via INTRAVENOUS

## 2017-10-31 MED ORDER — ACETAMINOPHEN 325 MG PO TABS
650.0000 mg | ORAL_TABLET | Freq: Four times a day (QID) | ORAL | Status: DC | PRN
  Administered 2017-11-09 – 2017-11-11 (×2): 650 mg via ORAL
  Filled 2017-10-31 (×2): qty 2

## 2017-10-31 MED ORDER — POTASSIUM CHLORIDE 10 MEQ/100ML IV SOLN
INTRAVENOUS | Status: AC
  Filled 2017-10-31: qty 100

## 2017-10-31 MED ORDER — KCL IN DEXTROSE-NACL 40-5-0.9 MEQ/L-%-% IV SOLN
INTRAVENOUS | Status: DC
  Administered 2017-10-31 – 2017-11-01 (×2): via INTRAVENOUS
  Filled 2017-10-31 (×3): qty 1000

## 2017-10-31 MED ORDER — INSULIN ASPART 100 UNIT/ML ~~LOC~~ SOLN
0.0000 [IU] | Freq: Three times a day (TID) | SUBCUTANEOUS | Status: DC
  Administered 2017-10-31 – 2017-11-01 (×2): 3 [IU] via SUBCUTANEOUS
  Administered 2017-11-01: 5 [IU] via SUBCUTANEOUS
  Administered 2017-11-01: 3 [IU] via SUBCUTANEOUS
  Administered 2017-11-02: 2 [IU] via SUBCUTANEOUS
  Administered 2017-11-02: 3 [IU] via SUBCUTANEOUS
  Administered 2017-11-02 – 2017-11-03 (×2): 2 [IU] via SUBCUTANEOUS
  Administered 2017-11-03 – 2017-11-04 (×3): 1 [IU] via SUBCUTANEOUS
  Administered 2017-11-05: 2 [IU] via SUBCUTANEOUS
  Administered 2017-11-05 – 2017-11-06 (×2): 1 [IU] via SUBCUTANEOUS
  Administered 2017-11-06: 2 [IU] via SUBCUTANEOUS
  Administered 2017-11-07 – 2017-11-09 (×5): 1 [IU] via SUBCUTANEOUS
  Administered 2017-11-09 – 2017-11-10 (×4): 2 [IU] via SUBCUTANEOUS
  Administered 2017-11-11 (×2): 1 [IU] via SUBCUTANEOUS
  Administered 2017-11-13: 2 [IU] via SUBCUTANEOUS
  Administered 2017-11-13 – 2017-11-14 (×2): 1 [IU] via SUBCUTANEOUS
  Administered 2017-11-14: 2 [IU] via SUBCUTANEOUS

## 2017-10-31 MED ORDER — LEVOTHYROXINE SODIUM 100 MCG PO TABS
125.0000 ug | ORAL_TABLET | Freq: Every day | ORAL | Status: DC
  Administered 2017-11-01 – 2017-11-14 (×10): 125 ug via ORAL
  Filled 2017-10-31 (×12): qty 1

## 2017-10-31 MED ORDER — POTASSIUM CHLORIDE CRYS ER 20 MEQ PO TBCR
40.0000 meq | EXTENDED_RELEASE_TABLET | Freq: Two times a day (BID) | ORAL | Status: AC
  Administered 2017-10-31 (×2): 40 meq via ORAL
  Filled 2017-10-31 (×2): qty 2

## 2017-10-31 MED ORDER — ATORVASTATIN CALCIUM 20 MG PO TABS
20.0000 mg | ORAL_TABLET | Freq: Every day | ORAL | Status: DC
  Administered 2017-10-31 – 2017-11-13 (×11): 20 mg via ORAL
  Filled 2017-10-31 (×12): qty 1

## 2017-10-31 MED ORDER — HEPARIN SODIUM (PORCINE) 5000 UNIT/ML IJ SOLN
5000.0000 [IU] | Freq: Three times a day (TID) | INTRAMUSCULAR | Status: DC
  Administered 2017-10-31 – 2017-11-14 (×43): 5000 [IU] via SUBCUTANEOUS
  Filled 2017-10-31 (×41): qty 1

## 2017-10-31 MED ORDER — ACETAMINOPHEN 650 MG RE SUPP: 650.0000 mg | Freq: Four times a day (QID) | RECTAL | Status: DC | PRN

## 2017-10-31 MED ORDER — INSULIN ASPART 100 UNIT/ML ~~LOC~~ SOLN
0.0000 [IU] | Freq: Every day | SUBCUTANEOUS | Status: DC
  Administered 2017-10-31: 3 [IU] via SUBCUTANEOUS
  Administered 2017-11-01 – 2017-11-02 (×2): 2 [IU] via SUBCUTANEOUS

## 2017-10-31 MED ORDER — INSULIN GLARGINE 100 UNIT/ML ~~LOC~~ SOLN
50.0000 [IU] | Freq: Every day | SUBCUTANEOUS | Status: DC
  Administered 2017-10-31: 50 [IU] via SUBCUTANEOUS
  Filled 2017-10-31 (×4): qty 0.5

## 2017-10-31 NOTE — ED Notes (Signed)
Date and time results received: 10/31/17 3:10 PM  (use smartphrase ".now" to insert current time)  Test: Troponin Critical Value: 0.70  Name of Provider Notified: Tat  Orders Received? Or Actions Taken?: Orders Received - See Orders for details

## 2017-10-31 NOTE — Progress Notes (Signed)
Patient becoming more oriented and with sister at bedside feeding her is having normal conversations.  Sister feeding her at this time and she must be fed at nursing home, as well.  Stated she uses wheelchair at nursing home and does not walk.  Nares positive for MRSA>  Contacted Dr. Thedore MinsSingh and initiated contact protocol

## 2017-10-31 NOTE — ED Notes (Signed)
CRITICAL VALUE ALERT  Critical Value:  K 2.6  Date & Time Notied: 10/31/17 10004  Provider Notified: Estell HarpinZammit  Orders Received/Actions taken:

## 2017-10-31 NOTE — H&P (Signed)
TRH H&P   Patient Demographics:    Dawn Ho, is a 67 y.o. female  MRN: [id number]   DOB - 1950/12/21  Admit Date - 10/31/2017  Outpatient Primary MD for the patient is Bernerd Limbo, MD    Patient coming from: SNF  Chief Complaint  Patient presents with  . Altered Mental Status      HPI:    Dawn Ho  is a 67 y.o. 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  I was called to admit.  By the time I seen her she has received antibiotics and IV fluids with moderate improvement.  Now able to answer questions and follow some commands.  Denies any subjective complaints other than lethargy and generalized weakness.    Review of systems:    A full 10 point Review of Systems was done, except as stated above, all other Review of Systems were negative.   With Past History of the following :    Past Medical History:  Diagnosis Date  . CVA (cerebral infarction)   . Depression with anxiety   . Diabetes mellitus   . Dyslipidemia   . Fibromyalgia   . Hypertension   . TIA (transient ischemic attack)       Past Surgical History:  Procedure Laterality Date  . ABDOMINAL HYSTERECTOMY     age 48, "removed one  ovary- excessive bleeding"  . CESAREAN SECTION    . CHOLECYSTECTOMY    . LOOP RECORDER IMPLANT     Dr. Johney Frame placed 10/15/2014  . TEE WITHOUT CARDIOVERSION N/A 10/15/2014   Procedure: TRANSESOPHAGEAL ECHOCARDIOGRAM (TEE);  Surgeon: Pricilla Riffle, MD;  Location: [facility] ENDOSCOPY;  Service: Cardiovascular;  Laterality: N/A;      Social History:     Social History   Tobacco Use  . Smoking status: Never  Smoker  . Smokeless tobacco: Never Used  Substance Use Topics  . Alcohol use: No         Family History :     Family History  Problem Relation Age of Onset  . Heart disease Father   . Hypertension Sister        Home Medications:   Prior to Admission medications   Medication Sig Start Date End Date Taking? Authorizing Provider  acetaminophen (TYLENOL) 500 MG tablet Take 500 mg by mouth every 6 (six) hours as needed.      [provider]  atorvastatin (LIPITOR) 20 MG tablet Take 1 tablet (20 mg total) by mouth daily at 6 PM. 10/18/14   Meredeth Ide, MD  baclofen (LIORESAL) 10 MG tablet Take 1 tablet (10 mg total) by mouth 3 (three) times daily. Start one tablet twice daily x 2 weeks then three times daily 01/12/15   Micki Riley, MD  clopidogrel (PLAVIX) 75 MG tablet Take 1 tablet (75 mg total) by mouth daily. 10/18/14   Meredeth Ide, MD  cyanocobalamin (,VITAMIN B-12,) 1000 MCG/ML injection Inject 1,000 mcg into the muscle every 30 (thirty) days.      [provider]  furosemide (LASIX) 40 MG tablet Take 60 mg by mouth daily.     [provider]  gabapentin (NEURONTIN) 300 MG capsule Take 1 capsule (300 mg total) by mouth at bedtime. 10/18/14   Meredeth Ide, MD  insulin glargine (LANTUS) 100 UNIT/ML injection Inject 0.65 mLs (65 Units total) into the skin at bedtime. 11/22/14   Kathlen Mody, MD  insulin regular (NOVOLIN R,HUMULIN R) 100 units/mL injection Inject 12-15 Units into the skin 3 (three) times daily before meals. 12 units before breakfast, 15  units before lunch and dinner    [provider]  levothyroxine (SYNTHROID, LEVOTHROID) 125 MCG tablet Take 125 mcg by mouth daily.      [provider]  LORazepam (ATIVAN) 0.5 MG tablet Take one tablet by mouth twice daily for anxiety 09/27/15   Sharon Seller, NP  LORazepam (ATIVAN) 1 MG tablet Take one tablet by mouth twice daily for anxiety 12/23/14   Sharon Seller, NP  losartan (COZAAR) 100 MG tablet Take 100 mg by mouth daily.      [provider]  metoprolol succinate (TOPROL XL) 50 MG 24 hr tablet Take 1 tablet (50 mg total) by mouth daily. Take with or immediately following a meal. 11/22/14   Kathlen Mody, MD  Multiple Vitamins-Minerals (MULTIVITAMIN WITH MINERALS) tablet Take 1 tablet by mouth daily.    [provider]  multivitamin with minerals (CERTA-VITE) LIQD Take 5 mLs by mouth daily.    [provider]  pantoprazole (PROTONIX) 40 MG tablet Take 40 mg by mouth daily.    [provider]  potassium chloride SA (K-DUR,KLOR-CON) 20 MEQ tablet Take 20 mEq by mouth daily.      [provider]  sertraline (ZOLOFT) 50 MG tablet Take 50 mg by mouth daily.    [provider]     Allergies:     Allergies  Allergen Reactions  . Ivp Dye [Iodinated Diagnostic Agents]     Per pt: swelling with first contrast xray in 1969. Second contrast xray in the 90s without symptoms:  Patient has been pre-medicated for CT scans in 2016. Needs to have pre-medications before any scans with IV contrast. / TSF 08/30/16  . Penicillins Swelling and Rash     Physical  Exam:   Vitals  Blood pressure 119/67, pulse (!) 104, temperature 99.8 F (37.7 C), temperature source Rectal, resp. rate 16, SpO2 92 %.   1. General telemetry white female lying in hospital bed in no apparent distress, appears fatigued  2. Normal affect and insight, Not Suicidal or Homicidal, Awake Alert, Oriented X 3.  3. No F.N deficits, ALL C.Nerves Intact,  strength is 1/5 on the left side, 5/5 on the right.  4. Ears and Eyes appear Normal, Conjunctivae clear, PERRLA. Moist Oral Mucosa.  5. Supple Neck, No JVD, No cervical lymphadenopathy appriciated, No Carotid Bruits.  6. Symmetrical Chest wall movement, Good air movement bilaterally, CTAB.  7. RRR, No Gallops, Rubs or Murmurs, No Parasternal Heave.  8. Positive Bowel Sounds, Abdomen Soft, No tenderness, No organomegaly appriciated,No rebound -guarding or rigidity.  9.  No Cyanosis, Normal Skin Turgor, No Skin Rash or Bruise.  10. Good muscle tone,  joints appear normal , no effusions, Normal ROM.  11. No Palpable Lymph Nodes in Neck or Axillae      Data Review:    CBC Recent Labs  Lab 10/31/17 0932  WBC 13.3*  HGB 12.9  HCT 40.8  PLT 264  MCV 89.5  MCH 28.3  MCHC 31.6  RDW 14.9  LYMPHSABS 1.9  MONOABS 0.9  EOSABS 0.2  BASOSABS 0.0   ------------------------------------------------------------------------------------------------------------------  Chemistries  Recent Labs  Lab 10/31/17 0932  NA 144  K 2.6*  CL 108  CO2 20*  GLUCOSE 201*  BUN 36*  CREATININE 1.55*  CALCIUM 9.2  AST 15  ALT 13*  ALKPHOS 70  BILITOT 1.0   ------------------------------------------------------------------------------------------------------------------ CrCl cannot be calculated (Unknown ideal weight.). ------------------------------------------------------------------------------------------------------------------ No results for input(s): TSH, T4TOTAL, T3FREE, THYROIDAB in the last 72 hours.  Invalid input(s): FREET3  Coagulation profile No results for input(s): INR, PROTIME in the last 168 hours. ------------------------------------------------------------------------------------------------------------------- No results for input(s): DDIMER in the last 72  hours. -------------------------------------------------------------------------------------------------------------------  Cardiac Enzymes Recent Labs  Lab 10/31/17 0932  TROPONINI <0.03   ------------------------------------------------------------------------------------------------------------------ No results found for: BNP   ---------------------------------------------------------------------------------------------------------------  Urinalysis    Component Value Date/Time   COLORURINE YELLOW 10/31/2017 0842   APPEARANCEUR HAZY (A) 10/31/2017 0842   LABSPEC 1.009 10/31/2017 0842   PHURINE 5.0 10/31/2017 0842   GLUCOSEU NEGATIVE 10/31/2017 0842   HGBUR NEGATIVE 10/31/2017 0842   BILIRUBINUR NEGATIVE 10/31/2017 0842   KETONESUR NEGATIVE 10/31/2017 0842   PROTEINUR NEGATIVE 10/31/2017 0842   UROBILINOGEN 0.2 11/17/2014 1100   NITRITE POSITIVE (A) 10/31/2017 0842   LEUKOCYTESUR TRACE (A) 10/31/2017 0842    ----------------------------------------------------------------------------------------------------------------   Imaging Results:    Ct Head Wo Contrast  Result Date: 10/31/2017 CLINICAL DATA:  Altered level of consciousness, slurring words EXAM: CT HEAD WITHOUT CONTRAST TECHNIQUE: Contiguous axial images were obtained from the base of the skull through the vertex without intravenous contrast. COMPARISON:  CT brain scan of 08/30/2016 FINDINGS: Brain: The ventricular system remains prominent as do the cortical sulci, indicative of diffuse atrophy. The septum is midline in position. Significant small vessel ischemic change is noted throughout the periventricular white matter and bilateral old lacunar infarcts are noted. No definite acute interval change is seen. No hemorrhage is noted and there is no evidence of mass effect. Vascular: No vascular abnormality is seen on this unenhanced study. Skull: On bone window images, no calvarial abnormality is seen Sinuses/Orbits: .   The paranasal sinuses appear well pneumatized. Other: None. IMPRESSION: 1. Stable diffuse atrophy and significant small vessel ischemic  change throughout the periventricular white matter. 2. Stable old lacunar infarcts bilaterally. No definite acute process. Electronically Signed   By: Dwyane Dee M.D.   On: 10/31/2017 10:10   Mr Brain Wo Contrast  Result Date: 10/31/2017 CLINICAL DATA:  67 year old female with 2 days of altered mental status. No known injury. EXAM: MRI HEAD WITHOUT CONTRAST TECHNIQUE: Multiplanar, multiecho pulse sequences of the brain and surrounding structures were obtained without intravenous contrast. COMPARISON:  Head CT without contrast 0955 hours today. Brain MRI 11/17/2014. FINDINGS: Brain: No restricted diffusion or evidence of acute infarction. Chronic infarcts in the left basal ganglia, left thalamus, bilateral corona radiata, and right middle frontal gyrus with encephalomalacia and some hemosiderin are stable since 2016. Wallerian degeneration has developed in the right cerebral peduncle since that time. Additional widespread patchy cerebral white matter T2 and FLAIR hyperintensity has not significantly changed since 2016. Small lacunar infarcts in the right pons are stable to increased since the prior MRI, but chronic. The cerebellum remains normal. No midline shift, mass effect, evidence of mass lesion, ventriculomegaly, extra-axial collection or acute intracranial hemorrhage. Cervicomedullary junction and pituitary are within normal limits. Vascular: Major intracranial vascular flow voids are stable since 2016. Skull and upper cervical spine: Negative visible cervical spine. Normal bone marrow signal. Sinuses/Orbits: Interval postoperative changes to the left globe. Otherwise negative orbits soft tissues. Paranasal sinuses and mastoids are stable and well pneumatized. Other: Scalp and face soft tissues appear negative. IMPRESSION: 1.  No acute intracranial abnormality. 2.  Advanced chronic ischemic disease without significant progression since the 2016 MRI. Wallerian degeneration has developed along the right cerebral peduncle. Electronically Signed   By: Odessa Fleming M.D.   On: 10/31/2017 11:20   Dg Chest Portable 1 View  Result Date: 10/31/2017 CLINICAL DATA:  Weakness.  Diabetes. EXAM: PORTABLE CHEST 1 VIEW COMPARISON:  CT 11/19/2014. FINDINGS: Mediastinum hilar structures are normal. Cardiomegaly with normal pulmonary vascularity. Low lung volumes with mild basilar atelectasis. Tiny rounded radiopacity noted projected over lower anterior rib is unchanged and is most likely a bone island. IMPRESSION: 1.  Cardiomegaly.  No pulmonary venous congestion. 2. Low lung volumes with mild basilar atelectasis. No acute infiltrate. Electronically Signed   By: Maisie Fus  Register   On: 10/31/2017 09:27    My personal review of EKG: Rhythm NSR, bifascicular block, nonspecific ST changes   Assessment & Plan:    Active Problems:   UTI (urinary tract infection)    1.  Gastroenteritis induced dehydration, ARF and hypokalemia.  Will be kept in 23-hour observation on telemetry, replace potassium, IV fluids for hydration, short course of Flagyl and Rocephin, abdominal exam is benign, she has had 2-3 bowel movements in 2 days with one emesis, monitor clinically.  2.  Metabolic encephalopathy due to dehydration.  Hydrate, hold offending medications which are muscle relaxers and narcotics along with benzodiazepines.  3.  DM type II.  Since oral intake is poor cut down on nighttime Lantus dose, monitor with sliding scale insulin.  4.  ARF.  As in #1 above, hold diuretic and ACE inhibitor.  5.  History of stroke with left-sided hemiparesis.  Supportive care.  Continue Plavix and statin, PT consult, baseline wheelchair bound.  6.  Chronic diastolic CHF.  EF preserved on last echocardiogram.  Supportive care.  Currently dehydrated.     DVT Prophylaxis Heparin   AM Labs Ordered, also  please review Full Orders  Family Communication: Admission, patients condition and plan of care including tests being ordered  have been discussed with the patient and children who indicate understanding and agree with the plan and Code Status.  Code Status Full  Likely DC to  SNF  Condition Fair  Consults called: None    Admission status: Obs    Time spent in minutes : 35   Susa Raring M.D on 10/31/2017 at 1:12 PM  Between 7am to 7pm - Pager - [phone] ( page via [facility], text pages only, please mention full 10 digit call back number).  After 7pm go to [email or web address] - password [facility]  [facility] - Office  [phone]

## 2017-10-31 NOTE — ED Provider Notes (Signed)
[facility] EMERGENCY DEPARTMENT Provider Note   CSN: [id number] Arrival date & time: 10/31/17  0827     History   Chief Complaint Chief Complaint  Patient presents with  . Altered Mental Status    HPI Dawn Ho is a 67 y.o. female.  Patient presented with some confusion.  Patient was last seen normal yesterday.  According to family this happens to her occasionally.  Patient has a history of an old stroke   The history is provided by the patient, medical records and a relative. No language interpreter was used.  Altered Mental Status   This is a new problem. The current episode started 12 to 24 hours ago. The problem has not changed since onset.Associated symptoms include confusion. Risk factors: History of stroke. Her past medical history does not include seizures.    Past Medical History:  Diagnosis Date  . CVA (cerebral infarction)   . Depression with anxiety   . Diabetes mellitus   . Dyslipidemia   . Fibromyalgia   . Hypertension   . TIA (transient ischemic attack)     Patient Active Problem List   Diagnosis Date Noted  . 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 Spastic hemiparesis (HCC) 01/12/2015  . HLD (hyperlipidemia)   . History of recent stroke   . Accelerated hypertension   . Acute cystitis without hematuria   . Tachycardia   . Stroke (HCC) 11/17/2014  . Left-sided weakness 11/17/2014  . 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 CVA (cerebral vascular accident) (HCC) 10/14/2014  . CVA (cerebral infarction)   . TIA (transient ischemic attack) 10/13/2014  . Chest pain 08/07/2011  . Fibromyalgia 08/07/2011  . Costochondritis 08/07/2011  . Diabetes mellitus (HCC) 08/07/2011  . Depression with anxiety 08/07/2011  . Obesity 08/07/2011  . History of TIAs 08/07/2011  . Hyperlipidemia 08/07/2011    Past Surgical History:  Procedure Laterality Date  . ABDOMINAL HYSTERECTOMY     age 89, "removed one ovary- excessive bleeding"    . CESAREAN SECTION    . CHOLECYSTECTOMY    . LOOP RECORDER IMPLANT     Dr. Johney Frame placed 10/15/2014  . TEE WITHOUT CARDIOVERSION N/A 10/15/2014   Procedure: TRANSESOPHAGEAL ECHOCARDIOGRAM (TEE);  Surgeon: Pricilla Riffle, MD;  Location: [facility] ENDOSCOPY;  Service: Cardiovascular;  Laterality: N/A;    OB History    No data available       Home Medications    Prior to Admission medications   Medication Sig Start Date End Date Taking? Authorizing Provider  acetaminophen (TYLENOL) 500 MG tablet Take 500 mg by mouth every 6 (six) hours as needed.      [provider]  atorvastatin (LIPITOR) 20 MG tablet Take 1 tablet (20 mg total) by mouth daily at 6 PM. 10/18/14   Meredeth Ide, MD  baclofen (LIORESAL) 10 MG tablet Take 1 tablet (10 mg total) by mouth 3 (three) times daily. Start one tablet twice daily x 2 weeks then three times daily 01/12/15   Micki Riley, MD  clopidogrel (PLAVIX) 75 MG tablet Take 1 tablet (75 mg total) by mouth daily. 10/18/14   Meredeth Ide, MD  cyanocobalamin (,VITAMIN B-12,) 1000 MCG/ML injection Inject 1,000 mcg into the muscle every 30 (thirty) days.      [provider]  furosemide (LASIX) 40 MG tablet Take 60 mg by mouth daily.     [provider]  gabapentin (NEURONTIN) 300 MG capsule Take 1 capsule (  300 mg total) by mouth at bedtime. 10/18/14   Meredeth IdeLama, Gagan S, MD  insulin glargine (LANTUS) 100 UNIT/ML injection Inject 0.65 mLs (65 Units total) into the skin at bedtime. 11/22/14   Kathlen ModyAkula, Vijaya, MD  insulin regular (NOVOLIN R,HUMULIN R) 100 units/mL injection Inject 12-15 Units into the skin 3 (three) times daily before meals. 12 units before breakfast, 15 units before lunch and dinner    [provider]  levothyroxine (SYNTHROID, LEVOTHROID) 125 MCG tablet Take 125 mcg by mouth daily.      [provider]  LORazepam (ATIVAN) 0.5 MG tablet Take one tablet by mouth twice daily for anxiety 09/27/15   Sharon SellerEubanks, Jessica K, NP   LORazepam (ATIVAN) 1 MG tablet Take one tablet by mouth twice daily for anxiety 12/23/14   Sharon SellerEubanks, Jessica K, NP  losartan (COZAAR) 100 MG tablet Take 100 mg by mouth daily.      [provider]  metoprolol succinate (TOPROL XL) 50 MG 24 hr tablet Take 1 tablet (50 mg total) by mouth daily. Take with or immediately following a meal. 11/22/14   Kathlen ModyAkula, Vijaya, MD  Multiple Vitamins-Minerals (MULTIVITAMIN WITH MINERALS) tablet Take 1 tablet by mouth daily.    [provider]  multivitamin with minerals (CERTA-VITE) LIQD Take 5 mLs by mouth daily.    [provider]  pantoprazole (PROTONIX) 40 MG tablet Take 40 mg by mouth daily.    [provider]  potassium chloride SA (K-DUR,KLOR-CON) 20 MEQ tablet Take 20 mEq by mouth daily.      [provider]  sertraline (ZOLOFT) 50 MG tablet Take 50 mg by mouth daily.    [provider]    Family History Family History  Problem Relation Age of Onset  . Heart disease Father   . Hypertension Sister     Social History Social History   Tobacco Use  . Smoking status: Never Smoker  . Smokeless tobacco: Never Used  Substance Use Topics  . Alcohol use: No  . Drug use: No     Allergies   Ivp dye [iodinated diagnostic agents] and Penicillins   Review of Systems Review of Systems  Unable to perform ROS: Mental status change  Psychiatric/Behavioral: Positive for confusion.     Physical Exam Updated Vital Signs BP 119/67   Pulse (!) 104   Temp 99.8 F (37.7 C) (Rectal)   Resp 16   SpO2 92%   Physical Exam  Constitutional: She appears well-developed.  HENT:  Head: Normocephalic.  Eyes: Conjunctivae and EOM are normal. No scleral icterus.  Neck: Neck supple. No thyromegaly present.  Cardiovascular: Normal rate and regular rhythm. Exam reveals no gallop and no friction rub.  No murmur heard. Pulmonary/Chest: No stridor. She has no wheezes. She has no rales. She exhibits no tenderness.   Abdominal: She exhibits no distension. There is no tenderness. There is no rebound.  Musculoskeletal: Normal range of motion. She exhibits no edema.  Lymphadenopathy:    She has no cervical adenopathy.  Neurological: She exhibits normal muscle tone. Coordination normal.  Patient lethargic mildly confused she knew where she was who she is was not sure on the date or her age.   Patient has significant weakness to left arm but this is her normal  Skin: No rash noted. No erythema.     ED Treatments / Results  Labs (all labs ordered are listed, but only abnormal results are displayed) Labs Reviewed  CBC WITH DIFFERENTIAL/PLATELET - Abnormal; Notable for the  following components:      Result Value   WBC 13.3 (*)    Neutro Abs 10.2 (*)    All other components within normal limits  COMPREHENSIVE METABOLIC PANEL - Abnormal; Notable for the following components:   Potassium 2.6 (*)    CO2 20 (*)    Glucose, Bld 201 (*)    BUN 36 (*)    Creatinine, Ser 1.55 (*)    Albumin 3.3 (*)    ALT 13 (*)    GFR calc non Af Amer 34 (*)    GFR calc Af Amer 39 (*)    Anion gap 16 (*)    All other components within normal limits  URINALYSIS, ROUTINE W REFLEX MICROSCOPIC - Abnormal; Notable for the following components:   APPearance HAZY (*)    Nitrite POSITIVE (*)    Leukocytes, UA TRACE (*)    Bacteria, UA MANY (*)    Squamous Epithelial / LPF 0-5 (*)    All other components within normal limits  URINE CULTURE  RAPID URINE DRUG SCREEN, HOSP PERFORMED  TROPONIN I    EKG  EKG Interpretation  Date/Time:  Thursday October 31 2017 09:09:48 EDT Ventricular Rate:  101 PR Interval:    QRS Duration: 142 QT Interval:  403 QTC Calculation: 523 R Axis:   -84 Text Interpretation:  Sinus tachycardia RBBB and LAFB Probable inferior infarct, acute Abnrm T, consider ischemia, anterolateral lds Confirmed by Bethann Berkshire [phone]) on 10/31/2017 9:12:29 AM       Radiology Ct Head Wo Contrast  Result  Date: 10/31/2017 CLINICAL DATA:  Altered level of consciousness, slurring words EXAM: CT HEAD WITHOUT CONTRAST TECHNIQUE: Contiguous axial images were obtained from the base of the skull through the vertex without intravenous contrast. COMPARISON:  CT brain scan of 08/30/2016 FINDINGS: Brain: The ventricular system remains prominent as do the cortical sulci, indicative of diffuse atrophy. The septum is midline in position. Significant small vessel ischemic change is noted throughout the periventricular white matter and bilateral old lacunar infarcts are noted. No definite acute interval change is seen. No hemorrhage is noted and there is no evidence of mass effect. Vascular: No vascular abnormality is seen on this unenhanced study. Skull: On bone window images, no calvarial abnormality is seen Sinuses/Orbits: .  The paranasal sinuses appear well pneumatized. Other: None. IMPRESSION: 1. Stable diffuse atrophy and significant small vessel ischemic change throughout the periventricular white matter. 2. Stable old lacunar infarcts bilaterally. No definite acute process. Electronically Signed   By: Dwyane Dee M.D.   On: 10/31/2017 10:10   Mr Brain Wo Contrast  Result Date: 10/31/2017 CLINICAL DATA:  67 year old female with 2 days of altered mental status. No known injury. EXAM: MRI HEAD WITHOUT CONTRAST TECHNIQUE: Multiplanar, multiecho pulse sequences of the brain and surrounding structures were obtained without intravenous contrast. COMPARISON:  Head CT without contrast 0955 hours today. Brain MRI 11/17/2014. FINDINGS: Brain: No restricted diffusion or evidence of acute infarction. Chronic infarcts in the left basal ganglia, left thalamus, bilateral corona radiata, and right middle frontal gyrus with encephalomalacia and some hemosiderin are stable since 2016. Wallerian degeneration has developed in the right cerebral peduncle since that time. Additional widespread patchy cerebral white matter T2 and FLAIR  hyperintensity has not significantly changed since 2016. Small lacunar infarcts in the right pons are stable to increased since the prior MRI, but chronic. The cerebellum remains normal. No midline shift, mass effect, evidence of mass lesion, ventriculomegaly, extra-axial collection or acute intracranial  hemorrhage. Cervicomedullary junction and pituitary are within normal limits. Vascular: Major intracranial vascular flow voids are stable since 2016. Skull and upper cervical spine: Negative visible cervical spine. Normal bone marrow signal. Sinuses/Orbits: Interval postoperative changes to the left globe. Otherwise negative orbits soft tissues. Paranasal sinuses and mastoids are stable and well pneumatized. Other: Scalp and face soft tissues appear negative. IMPRESSION: 1.  No acute intracranial abnormality. 2. Advanced chronic ischemic disease without significant progression since the 2016 MRI. Wallerian degeneration has developed along the right cerebral peduncle. Electronically Signed   By: Odessa Fleming M.D.   On: 10/31/2017 11:20   Dg Chest Portable 1 View  Result Date: 10/31/2017 CLINICAL DATA:  Weakness.  Diabetes. EXAM: PORTABLE CHEST 1 VIEW COMPARISON:  CT 11/19/2014. FINDINGS: Mediastinum hilar structures are normal. Cardiomegaly with normal pulmonary vascularity. Low lung volumes with mild basilar atelectasis. Tiny rounded radiopacity noted projected over lower anterior rib is unchanged and is most likely a bone island. IMPRESSION: 1.  Cardiomegaly.  No pulmonary venous congestion. 2. Low lung volumes with mild basilar atelectasis. No acute infiltrate. Electronically Signed   By: Maisie Fus  Register   On: 10/31/2017 09:27    Procedures Procedures (including critical care time)  Medications Ordered in ED Medications  potassium chloride 10 mEq in 100 mL IVPB (10 mEq Intravenous New Bag/Given 10/31/17 1230)  potassium chloride SA (K-DUR,KLOR-CON) CR tablet 40 mEq (not administered)  potassium chloride  10 mEq in 100 mL IVPB (not administered)  potassium chloride 10 mEq in 100 mL IVPB (not administered)  magnesium sulfate IVPB 1 g 100 mL (not administered)  potassium chloride 10 mEq in 100 mL IVPB (0 mEq Intravenous Stopped 10/31/17 1223)  ciprofloxacin (CIPRO) IVPB 400 mg (0 mg Intravenous Stopped 10/31/17 1224)     Initial Impression / Assessment and Plan / ED Course  I have reviewed the triage vital signs and the nursing notes.  Pertinent labs & imaging results that were available during my care of the patient were reviewed by me and considered in my medical decision making (see chart for details). CRITICAL CARE Performed by: Bethann Berkshire Total critical care time:40 minutes Critical care time was exclusive of separately billable procedures and treating other patients. Critical care was necessary to treat or prevent imminent or life-threatening deterioration. Critical care was time spent personally by me on the following activities: development of treatment plan with patient and/or surrogate as well as nursing, discussions with consultants, evaluation of patient's response to treatment, examination of patient, obtaining history from patient or surrogate, ordering and performing treatments and interventions, ordering and review of laboratory studies, ordering and review of radiographic studies, pulse oximetry and re-evaluation of patient's condition.     Labs show patient has a urinary tract infection and hypokalemia.  CT scan and MRI scan were negative for any acute strokes.  Patient will be admitted to medicine for the hypokalemia and UTI Final Clinical Impressions(s) / ED Diagnoses   Final diagnoses:  None    ED Discharge Orders    None       Bethann Berkshire, MD 10/31/17 1252

## 2017-10-31 NOTE — ED Notes (Signed)
Incontinence care provided. Pt is alert and goes from answering questions appropriately to mumbling and unable to understand

## 2017-10-31 NOTE — ED Notes (Signed)
Pt transported to MRI 

## 2017-10-31 NOTE — ED Triage Notes (Addendum)
[facility]  Per staff, pt was repeating her last name, could not speak appropriate words and unaware of of situation.   MD at bedside.  Unaware of LNW. No code stroke called

## 2017-11-01 DIAGNOSIS — A09 Infectious gastroenteritis and colitis, unspecified: Secondary | ICD-10-CM | POA: Diagnosis not present

## 2017-11-01 DIAGNOSIS — E7800 Pure hypercholesterolemia, unspecified: Secondary | ICD-10-CM | POA: Diagnosis not present

## 2017-11-01 DIAGNOSIS — E876 Hypokalemia: Secondary | ICD-10-CM | POA: Diagnosis not present

## 2017-11-01 DIAGNOSIS — K56600 Partial intestinal obstruction, unspecified as to cause: Secondary | ICD-10-CM | POA: Diagnosis not present

## 2017-11-01 DIAGNOSIS — T17908D Unspecified foreign body in respiratory tract, part unspecified causing other injury, subsequent encounter: Secondary | ICD-10-CM | POA: Diagnosis not present

## 2017-11-01 DIAGNOSIS — E0810 Diabetes mellitus due to underlying condition with ketoacidosis without coma: Secondary | ICD-10-CM | POA: Diagnosis not present

## 2017-11-01 DIAGNOSIS — N3000 Acute cystitis without hematuria: Secondary | ICD-10-CM | POA: Diagnosis not present

## 2017-11-01 LAB — GLUCOSE, CAPILLARY
GLUCOSE-CAPILLARY: 223 mg/dL — AB (ref 65–99)
GLUCOSE-CAPILLARY: 276 mg/dL — AB (ref 65–99)
GLUCOSE-CAPILLARY: 210 mg/dL — AB (ref 65–99)
Glucose-Capillary: 224 mg/dL — ABNORMAL HIGH (ref 65–99)

## 2017-11-01 LAB — BASIC METABOLIC PANEL
Anion gap: 10 (ref 5–15)
BUN: 24 mg/dL — ABNORMAL HIGH (ref 6–20)
CALCIUM: 8.5 mg/dL — AB (ref 8.9–10.3)
CO2: 17 mmol/L — AB (ref 22–32)
Chloride: 118 mmol/L — ABNORMAL HIGH (ref 101–111)
Creatinine, Ser: 1.11 mg/dL — ABNORMAL HIGH (ref 0.44–1.00)
GFR calc non Af Amer: 50 mL/min — ABNORMAL LOW (ref >60)
GFR, EST AFRICAN AMERICAN: 58 mL/min — AB (ref >60)
Glucose, Bld: 255 mg/dL — ABNORMAL HIGH (ref 65–99)
Potassium: 3.2 mmol/L — ABNORMAL LOW (ref 3.5–5.1)
SODIUM: 145 mmol/L (ref 135–145)

## 2017-11-01 LAB — CBC
HEMATOCRIT: 37.9 % (ref 36.0–46.0)
Hemoglobin: 11.8 g/dL — ABNORMAL LOW (ref 12.0–15.0)
MCH: 28 pg (ref 26.0–34.0)
MCHC: 31.1 g/dL (ref 30.0–36.0)
MCV: 90 fL (ref 78.0–100.0)
Platelets: 246 10*3/uL (ref 150–400)
RBC: 4.21 MIL/uL (ref 3.87–5.11)
RDW: 14.8 % (ref 11.5–15.5)
WBC: 10.4 10*3/uL (ref 4.0–10.5)

## 2017-11-01 LAB — MAGNESIUM: MAGNESIUM: 2 mg/dL (ref 1.7–2.4)

## 2017-11-01 MED ORDER — POTASSIUM CHLORIDE 2 MEQ/ML IV SOLN
INTRAVENOUS | Status: DC
  Administered 2017-11-01 – 2017-11-02 (×2): via INTRAVENOUS
  Filled 2017-11-01 (×7): qty 1000

## 2017-11-01 MED ORDER — INSULIN GLARGINE 100 UNIT/ML ~~LOC~~ SOLN
65.0000 [IU] | Freq: Every day | SUBCUTANEOUS | Status: DC
  Administered 2017-11-01 – 2017-11-05 (×5): 65 [IU] via SUBCUTANEOUS
  Filled 2017-11-01 (×6): qty 0.65

## 2017-11-01 NOTE — Care Management Obs Status (Signed)
MEDICARE OBSERVATION STATUS NOTIFICATION   Patient Details  Name: Dawn Ho MRN: [id number] Date of Birth: November 22, 1950   Medicare Observation Status Notification Given:  Yes    Samaad Hashem, Chrystine OilerSharley Diane, RN 11/01/2017, 9:30 AM

## 2017-11-01 NOTE — Evaluation (Signed)
Physical Therapy Evaluation Patient Details Name: Dawn Ho MRN: [id number] DOB: 1950/09/03 Today's Date: 11/01/2017   History of Present Illness  Dawn Ho  is a 67 y.o. 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  I was called to admit.  By the time I seen her she has received antibiotics and IV fluids with moderate improvement.  Now able to answer questions and follow some commands.  Denies any subjective complaints other than lethargy and generalized weakness.    Clinical Impression  Patient very weak and requires Max assist to transfer to chair, to unsteady to attempt using hemi-walker for transfers due to poor standing balance with risk for falls, required hand held assist, limited for sitting up due to c/o stomach discomfort and put back to bed.  Patient will benefit from continued physical therapy in hospital and recommended venue below to increase strength, balance, endurance for safe ADLs and gait.    Follow Up Recommendations SNF    Equipment Recommendations  None recommended by PT    Recommendations for Other Services       Precautions / Restrictions Precautions Precautions: Fall Restrictions Weight Bearing Restrictions: No      Mobility  Bed Mobility Overal bed mobility: Needs Assistance Bed Mobility: Supine to Sit;Sit to Supine     Supine to sit: Mod assist Sit to supine: Mod assist   General bed mobility comments: required head of bed  raised  Transfers Overall transfer level: Needs assistance Equipment used: Rolling walker (2 wheeled) Transfers: Sit to/from [employer] Sit to Stand: Mod assist Stand pivot transfers: Max assist       General transfer comment: using RW with RUE  Ambulation/Gait Ambulation/Gait assistance: Max assist Ambulation Distance (Feet): 2 Feet Assistive device: 1 person hand held assist Gait Pattern/deviations: Decreased step length - right;Decreased step length - left;Decreased stride length;Decreased stance time - left   Gait velocity interpretation: Below normal speed for age/gender General Gait Details: required hand held assist by holding sides to take 2-3 steps to transfer to chair, unable to use assistive device due to weaknss/poor standing balance  Stairs            Wheelchair Mobility    Modified Rankin (Stroke Patients Only)       Balance Overall balance assessment: Needs assistance Sitting-balance support: Single extremity supported;Feet supported Sitting balance-Leahy Scale: Fair     Standing balance support: Single extremity supported;During functional activity Standing balance-Leahy Scale: Poor                               Pertinent Vitals/Pain Pain Assessment: 0-10 Pain Score: 7  Pain Location: LUE Pain Descriptors / Indicators: Aching;Discomfort;Guarding Pain Intervention(s): Limited activity within patient's tolerance;Monitored during session    Home Living Family/patient expects to be discharged to:: Skilled nursing facility  Prior Function Level of Independence: Needs assistance   Gait / Transfers Assistance Needed: assisted for short distanced gait using Hemi-walker (HW)  ADL's / Homemaking Assistance Needed: assisted by SNF staff  Comments: patient has hemi-walker and wheelchair     Hand Dominance        Extremity/Trunk Assessment   Upper Extremity Assessment Upper Extremity  Assessment: Generalized weakness;RUE deficits/detail;LUE deficits/detail RUE Deficits / Details: grossly -4/5 LUE Deficits / Details: grossly 2+/5 with poor hand grip 2/5    Lower Extremity Assessment Lower Extremity Assessment: Generalized weakness;RLE deficits/detail;LLE deficits/detail RLE Deficits / Details: grossly -4/5 LLE Deficits / Details: grossly -3/5       Communication   Communication: No difficulties  Cognition Arousal/Alertness: Awake/alert Behavior During Therapy: WFL for tasks assessed/performed Overall Cognitive Status: Within Functional Limits for tasks assessed                                        General Comments      Exercises     Assessment/Plan    PT Assessment Patient needs continued PT services  PT Problem List Decreased strength;Decreased activity tolerance;Decreased balance;Decreased mobility       PT Treatment Interventions Functional mobility training;Gait training;Therapeutic activities;Therapeutic exercise;Wheelchair mobility training;Patient/family education    PT Goals (Current goals can be found in the Care Plan section)  Acute Rehab PT Goals Patient Stated Goal: return to SNF for rehab PT Goal Formulation: With patient Time For Goal Achievement: 11/15/17 Potential to Achieve Goals: Good    Frequency Min 3X/week   Barriers to discharge        Co-evaluation               AM-PAC PT "6 Clicks" Daily Activity  Outcome Measure Difficulty turning over in bed (including adjusting bedclothes, sheets and blankets)?: A Lot Difficulty moving from lying on back to sitting on the side of the bed? : A Lot Difficulty sitting down on and standing up from a chair with arms (e.g., wheelchair, bedside commode, etc,.)?: A Lot Help needed moving to and from a bed to chair (including a wheelchair)?: A Lot Help needed walking in hospital room?: Total Help needed climbing 3-5 steps with a railing? : Total 6 Click Score:  10    End of Session   Activity Tolerance: Patient limited by fatigue Patient left: in bed;with call bell/phone within reach;with bed alarm set Nurse Communication: Mobility status;Other (comment)(nursing staff notified that perwick needs to be replaced due to soided) PT Visit Diagnosis: Unsteadiness on feet (R26.81);Other abnormalities of gait and mobility (R26.89);Muscle weakness (generalized) (M62.81)    Time: 1324-4010 PT Time Calculation (min) (ACUTE ONLY): 32 min   Charges:   PT Evaluation $PT Eval Moderate Complexity: 1 Mod PT Treatments $Therapeutic Activity: 23-37 mins   PT G Codes:        11:57 AM, 2017/11/25 Ocie Bob, MPT Physical Therapist with [facility] 336 [phone] office [phone] mobile phone

## 2017-11-01 NOTE — Progress Notes (Signed)
Inpatient Diabetes Program Recommendations  AACE/ADA: New Consensus Statement on Inpatient Glycemic Control (2015)  Target Ranges:  Prepandial:   less than 140 mg/dL      Peak postprandial:   less than 180 mg/dL (1-2 hours)      Critically ill patients:  140 - 180 mg/dL   Results for Dawn Ho, Dawn Ho (MRN [id number]) as of 11/01/2017 07:41  Ref. Range 10/31/2017 17:08 10/31/2017 19:55 11/01/2017 07:20  Glucose-Capillary Latest Ref Range: 65 - 99 mg/dL 829235 (H) 562280 (H) 130223 (H)  Results for Dawn Ho, Dawn Ho (MRN [id number]) as of 11/01/2017 07:41  Ref. Range 10/31/2017 10:00  Hemoglobin A1C Latest Ref Range: 4.8 - 5.6 % 7.0 (H)   Review of Glycemic Control  Diabetes history: DM2 Outpatient Diabetes medications: NPH 12 units daily, Lantus 70 units QHS, Regular 12 units with breakfast, 15 units with lunch and supper Current orders for Inpatient glycemic control: Lantus 50 units QHS, Novolog 0-9 units TID with meals, Novolog 0-5 units QHS  Inpatient Diabetes Program Recommendations:  Insulin - Basal: Please consider increasing Lantus to 55 units QHS. Insulin - Meal Coverage: Please consider ordering Novolog 4 units TID with meals for meal coverage if patient eats at least 50% of meals.  Thanks, Orlando PennerMarie Lala Been, RN, MSN, CDE Diabetes Coordinator Inpatient Diabetes Program [phone] (Team Pager from 8am to 5pm)

## 2017-11-01 NOTE — Plan of Care (Signed)
  Acute Rehab PT Goals(only PT should resolve) Pt Will Go Supine/Side To Sit 11/01/2017 1158 - Progressing by Ocie BobWatkins, Ariel Wingrove, PT Flowsheets Taken 11/01/2017 1158  Pt will go Supine/Side to Sit with minimal assist Patient Will Transfer Sit To/From Stand 11/01/2017 1158 - Progressing by Ocie BobWatkins, Shivansh Hardaway, PT Flowsheets Taken 11/01/2017 1158  Patient will transfer sit to/from stand with minimal assist Pt Will Transfer Bed To Chair/Chair To Bed 11/01/2017 1158 - Progressing by Ocie BobWatkins, Matty Deamer, PT Flowsheets Taken 11/01/2017 1158  Pt will Transfer Bed to Chair/Chair to Bed with mod assist Pt Will Ambulate 11/01/2017 1158 - Progressing by Ocie BobWatkins, Lasonya Hubner, PT Flowsheets Taken 11/01/2017 1158  Pt will Ambulate with moderate assist;10 feet Note With hemi-walker  11:59 AM, 11/01/17 Ocie BobJames Anil Havard, MPT [facility] Therapist [facility] 336 [phone] office [phone] mobile phone

## 2017-11-01 NOTE — Progress Notes (Signed)
PROGRESS NOTE    Dawn Ho  EXB:[id number] DOB: [birth year] DOA: 10/31/2017 PCP: Bernerd Limbo, MD    Brief Narrative:  67 year old female with a history of right MCA stroke, hypertension, diabetes, diastolic heart failure, who is a resident of nursing facility, presented to the hospital with altered mental status.  She was having vomiting and some loose stools.  Urinalysis indicated possible UTI.  She was noted to be dehydrated in any acute kidney injury on admission.  She was admitted for IV fluids and IV antibiotics.  Urine culture process.  Overall mental status has improved.  Plan is to return to skilled nursing facility on discharge.   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 Gastroenteritis induced dehydration, acute renal failure and hypokalemia.  Improving with IV fluids.  Currently on antibiotics.  Treat supportively with antiemetics.  No further diarrhea. 2. Urinary tract infection.  Currently on Rocephin.  Follow-up urine culture. 3. Metabolic encephalopathy due to dehydration.  Mental status appears to be approaching baseline.  Continue to monitor. 4. Acute kidney injury.  Related to #1 as well as diuretic and ACE inhibitor use.  These are currently on hold.  Creatinine is improving.  Recheck in a.m. 5. History of stroke with left-sided hemiparesis.  Supportive care.  Continue Plavix and statin. 6. Chronic diastolic congestive heart failure.  Appears compensated at this time.  Continue current management. 7. Diabetes.  Blood sugars have been stable.  Continue on Lantus and sliding scale insulin.   DVT prophylaxis: Heparin Code Status: Full code Family Communication: Discussed with son over the phone Disposition Plan: Discharged to skilled nursing facility, possibly in a.m. once culture  results are available   Consultants:     Procedures:     Antimicrobials:   Rocephin 3/14 >   Subjective: No shortness of breath, chest pain or vomiting.  Objective: Vitals:   10/31/17 1940 10/31/17 2104 11/01/17 0521 11/01/17 1516  BP:  132/62 (!) 128/59 (!) 134/56  Pulse:  90 94 85  Resp:  20 20 18   Temp:  98.2 F (36.8 C) 98.1 F (36.7 C) 98.2 F (36.8 C)  TempSrc:  Oral Oral Oral  SpO2: 92% 93% 94% 95%  Weight:   94.8 kg (208 lb 15.9 oz)   Height:        Intake/Output Summary (Last 24 hours) at 11/01/2017 1945 Last data filed at 11/01/2017 1800 Gross per 24 hour  Intake 2098.33 ml  Output 1700 ml  Net 398.33 ml   Filed Weights   10/31/17 1604 11/01/17 0521  Weight: 94.8 kg (208 lb 15.9 oz) 94.8 kg (208 lb 15.9 oz)    Examination:  General exam: Appears calm and comfortable  Respiratory system: Clear to auscultation. Respiratory effort normal. Cardiovascular system: S1 & S2 heard, RRR. No JVD, murmurs, rubs, gallops or clicks.  Gastrointestinal system: Abdomen is nondistended, soft and nontender. No organomegaly or masses felt. Normal bowel sounds heard. Central nervous system:  No focal neurological deficits. Extremities: No edema bilaterally Skin: No rashes, lesions or ulcers Psychiatry: Confused at times, otherwise pleasant    Data Reviewed: I have personally reviewed following labs and imaging studies  CBC: Recent Labs  Lab 10/31/17 0932 11/01/17 0504  WBC 13.3* 10.4  NEUTROABS 10.2*  --   HGB 12.9 11.8*  HCT  40.8 37.9  MCV 89.5 90.0  PLT 264 246   Basic Metabolic Panel: Recent Labs  Lab 10/31/17 0932 11/01/17 0504  NA 144 145  K 2.6* 3.2*  CL 108 118*  CO2 20* 17*  GLUCOSE 201* 255*  BUN 36* 24*  CREATININE 1.55* 1.11*  CALCIUM 9.2 8.5*  MG  --  2.0   GFR: Estimated Creatinine Clearance: 52.8 mL/min (A) (by C-G formula based on SCr of 1.11 mg/dL (H)). Liver Function Tests: Recent Labs  Lab 10/31/17 0932  AST 15  ALT  13*  ALKPHOS 70  BILITOT 1.0  PROT 7.3  ALBUMIN 3.3*   No results for input(s): LIPASE, AMYLASE in the last 168 hours. No results for input(s): AMMONIA in the last 168 hours. Coagulation Profile: No results for input(s): INR, PROTIME in the last 168 hours. Cardiac Enzymes: Recent Labs  Lab 10/31/17 0932  TROPONINI <0.03   BNP (last 3 results) No results for input(s): PROBNP in the last 8760 hours. HbA1C: Recent Labs    10/31/17 1000  HGBA1C 7.0*   CBG: Recent Labs  Lab 10/31/17 1708 10/31/17 1955 11/01/17 0720 11/01/17 1117 11/01/17 1653  GLUCAP 235* 280* 223* 276* 210*   Lipid Profile: No results for input(s): CHOL, HDL, LDLCALC, TRIG, CHOLHDL, LDLDIRECT in the last 72 hours. Thyroid Function Tests: No results for input(s): TSH, T4TOTAL, FREET4, T3FREE, THYROIDAB in the last 72 hours. Anemia Panel: No results for input(s): VITAMINB12, FOLATE, FERRITIN, TIBC, IRON, RETICCTPCT in the last 72 hours. Sepsis Labs: No results for input(s): PROCALCITON, LATICACIDVEN in the last 168 hours.  Recent Results (from the past 240 hour(s))  MRSA PCR Screening     Status: Abnormal   Collection Time: 10/31/17  4:22 PM  Result Value Ref Range Status   MRSA by PCR POSITIVE (A) NEGATIVE Final    Comment:        The GeneXpert MRSA Assay (FDA approved for NASAL specimens only), is one component of a comprehensive MRSA colonization surveillance program. It is not intended to diagnose MRSA infection nor to guide or monitor treatment for MRSA infections. RESULT CALLED TO, READ BACK BY AND VERIFIED WITH: JACKSON,N AT 614PM ON [phone] BY ISLEY,B Performed at [facility], [address] [place], [place] [address]          Radiology Studies: Ct Head Wo Contrast  Result Date: 10/31/2017 CLINICAL DATA:  Altered level of consciousness, slurring words EXAM: CT HEAD WITHOUT CONTRAST TECHNIQUE: Contiguous axial images were obtained from the base of the skull through the vertex  without intravenous contrast. COMPARISON:  CT brain scan of 08/30/2016 FINDINGS: Brain: The ventricular system remains prominent as do the cortical sulci, indicative of diffuse atrophy. The septum is midline in position. Significant small vessel ischemic change is noted throughout the periventricular white matter and bilateral old lacunar infarcts are noted. No definite acute interval change is seen. No hemorrhage is noted and there is no evidence of mass effect. Vascular: No vascular abnormality is seen on this unenhanced study. Skull: On bone window images, no calvarial abnormality is seen Sinuses/Orbits: .  The paranasal sinuses appear well pneumatized. Other: None. IMPRESSION: 1. Stable diffuse atrophy and significant small vessel ischemic change throughout the periventricular white matter. 2. Stable old lacunar infarcts bilaterally. No definite acute process. Electronically Signed   By: Dwyane Dee M.D.   On: 10/31/2017 10:10   Mr Brain Wo Contrast  Result Date: 10/31/2017 CLINICAL DATA:  67 year old female with 2 days of altered mental status.  No known injury. EXAM: MRI HEAD WITHOUT CONTRAST TECHNIQUE: Multiplanar, multiecho pulse sequences of the brain and surrounding structures were obtained without intravenous contrast. COMPARISON:  Head CT without contrast 0955 hours today. Brain MRI 11/17/2014. FINDINGS: Brain: No restricted diffusion or evidence of acute infarction. Chronic infarcts in the left basal ganglia, left thalamus, bilateral corona radiata, and right middle frontal gyrus with encephalomalacia and some hemosiderin are stable since 2016. Wallerian degeneration has developed in the right cerebral peduncle since that time. Additional widespread patchy cerebral white matter T2 and FLAIR hyperintensity has not significantly changed since 2016. Small lacunar infarcts in the right pons are stable to increased since the prior MRI, but chronic. The cerebellum remains normal. No midline shift, mass  effect, evidence of mass lesion, ventriculomegaly, extra-axial collection or acute intracranial hemorrhage. Cervicomedullary junction and pituitary are within normal limits. Vascular: Major intracranial vascular flow voids are stable since 2016. Skull and upper cervical spine: Negative visible cervical spine. Normal bone marrow signal. Sinuses/Orbits: Interval postoperative changes to the left globe. Otherwise negative orbits soft tissues. Paranasal sinuses and mastoids are stable and well pneumatized. Other: Scalp and face soft tissues appear negative. IMPRESSION: 1.  No acute intracranial abnormality. 2. Advanced chronic ischemic disease without significant progression since the 2016 MRI. Wallerian degeneration has developed along the right cerebral peduncle. Electronically Signed   By: Odessa Fleming M.D.   On: 10/31/2017 11:20   Dg Chest Portable 1 View  Result Date: 10/31/2017 CLINICAL DATA:  Weakness.  Diabetes. EXAM: PORTABLE CHEST 1 VIEW COMPARISON:  CT 11/19/2014. FINDINGS: Mediastinum hilar structures are normal. Cardiomegaly with normal pulmonary vascularity. Low lung volumes with mild basilar atelectasis. Tiny rounded radiopacity noted projected over lower anterior rib is unchanged and is most likely a bone island. IMPRESSION: 1.  Cardiomegaly.  No pulmonary venous congestion. 2. Low lung volumes with mild basilar atelectasis. No acute infiltrate. Electronically Signed   By: Maisie Fus  Register   On: 10/31/2017 09:27        Scheduled Meds: . atorvastatin  20 mg Oral q1800  . baclofen  5 mg Oral BID  . Chlorhexidine Gluconate Cloth  6 each Topical Q0600  . clopidogrel  75 mg Oral Daily  . [START ON 11/11/2017] cyanocobalamin  1,000 mcg Intramuscular Q30 days  . heparin  5,000 Units Subcutaneous Q8H  . insulin aspart  0-5 Units Subcutaneous QHS  . insulin aspart  0-9 Units Subcutaneous TID WC  . insulin glargine  50 Units Subcutaneous QHS  . levothyroxine  125 mcg Oral QAC breakfast  .  metoprolol succinate  50 mg Oral Daily  . mupirocin ointment  1 application Nasal BID  . pantoprazole  40 mg Oral Daily   Continuous Infusions: . cefTRIAXone (ROCEPHIN)  IV Stopped (11/01/17 1516)  . dextrose 5 % 1,000 mL with potassium chloride 40 mEq infusion 100 mL/hr at 11/01/17 1757  . metronidazole Stopped (11/01/17 1343)     LOS: 0 days    Time spent:    Erick Blinks, MD [facility] Pager [phone]  If 7PM-7AM, please contact night-coverage [email or web address] Password [facility] 11/01/2017, 7:45 PM

## 2017-11-02 DIAGNOSIS — E7800 Pure hypercholesterolemia, unspecified: Secondary | ICD-10-CM | POA: Diagnosis not present

## 2017-11-02 DIAGNOSIS — N3000 Acute cystitis without hematuria: Secondary | ICD-10-CM | POA: Diagnosis not present

## 2017-11-02 DIAGNOSIS — E876 Hypokalemia: Secondary | ICD-10-CM | POA: Diagnosis not present

## 2017-11-02 DIAGNOSIS — E0810 Diabetes mellitus due to underlying condition with ketoacidosis without coma: Secondary | ICD-10-CM | POA: Diagnosis not present

## 2017-11-02 LAB — GLUCOSE, CAPILLARY
GLUCOSE-CAPILLARY: 195 mg/dL — AB (ref 65–99)
GLUCOSE-CAPILLARY: 227 mg/dL — AB (ref 65–99)
Glucose-Capillary: 165 mg/dL — ABNORMAL HIGH (ref 65–99)
Glucose-Capillary: 208 mg/dL — ABNORMAL HIGH (ref 65–99)
Glucose-Capillary: 390 mg/dL — ABNORMAL HIGH (ref 65–99)

## 2017-11-02 LAB — C DIFFICILE QUICK SCREEN W PCR REFLEX
C DIFFICILE (CDIFF) TOXIN: NEGATIVE
C Diff antigen: NEGATIVE
C Diff interpretation: NOT DETECTED

## 2017-11-02 LAB — BASIC METABOLIC PANEL
ANION GAP: 11 (ref 5–15)
BUN: 14 mg/dL (ref 6–20)
CALCIUM: 9 mg/dL (ref 8.9–10.3)
CHLORIDE: 119 mmol/L — AB (ref 101–111)
CO2: 17 mmol/L — ABNORMAL LOW (ref 22–32)
CREATININE: 0.87 mg/dL (ref 0.44–1.00)
GFR calc Af Amer: >60 mL/min (ref >60)
GFR calc non Af Amer: >60 mL/min (ref >60)
Glucose, Bld: 193 mg/dL — ABNORMAL HIGH (ref 65–99)
Potassium: 2.8 mmol/L — ABNORMAL LOW (ref 3.5–5.1)
SODIUM: 147 mmol/L — AB (ref 135–145)

## 2017-11-02 LAB — MAGNESIUM: Magnesium: 1.9 mg/dL (ref 1.7–2.4)

## 2017-11-02 MED ORDER — SODIUM BICARBONATE 8.4 % IV SOLN
INTRAVENOUS | Status: DC
  Filled 2017-11-02 (×4): qty 830

## 2017-11-02 MED ORDER — SODIUM BICARBONATE 8.4 % IV SOLN
INTRAVENOUS | Status: DC
  Administered 2017-11-02 – 2017-11-03 (×2): via INTRAVENOUS
  Filled 2017-11-02 (×4): qty 1000

## 2017-11-02 MED ORDER — POTASSIUM CHLORIDE CRYS ER 20 MEQ PO TBCR
40.0000 meq | EXTENDED_RELEASE_TABLET | ORAL | Status: AC
  Administered 2017-11-02 (×2): 40 meq via ORAL
  Filled 2017-11-02 (×2): qty 2

## 2017-11-02 MED ORDER — SODIUM BICARBONATE 8.4 % IV SOLN
INTRAVENOUS | Status: AC
  Filled 2017-11-02: qty 150

## 2017-11-02 MED ORDER — SODIUM BICARBONATE 8.4 % IV SOLN
INTRAVENOUS | Status: DC
  Filled 2017-11-02 (×4): qty 1000

## 2017-11-02 NOTE — [place] FL2 (Signed)
[place] MEDICAID FL2 LEVEL OF CARE SCREENING TOOL     IDENTIFICATION  Patient Name: Dawn Ho Birthdate: March 17, 1951 Sex: female Admission Date (Current Location): 10/31/2017  [facility] and [place] Number:  [employer] and Address:  [facility],  [address], [place] [address]      Provider Number: [id number]  Attending Physician Name and Address:  Erick Blinks, MD  Relative Name and Phone Number:  Katharine Look [phone]    Current Level of Care: Hospital Recommended Level of Care: Skilled Nursing Facility Prior Approval Number:    Date Approved/Denied:   PASRR Number: [id number]  Discharge Plan: SNF    Current Diagnoses: Patient Active Problem List   Diagnosis Date Noted  . UTI (urinary tract infection) 10/31/2017  . Hypokalemia 10/31/2017  . 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 Spastic hemiparesis (HCC) 01/12/2015  . HLD (hyperlipidemia)   . History of recent stroke   . Accelerated hypertension   . Acute cystitis without hematuria   . Tachycardia   . Stroke (HCC) 11/17/2014  . Left-sided weakness 11/17/2014  . 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 CVA (cerebral vascular accident) (HCC) 10/14/2014  . CVA (cerebral infarction)   . TIA (transient ischemic attack) 10/13/2014  . Chest pain 08/07/2011  . Fibromyalgia 08/07/2011  . Costochondritis 08/07/2011  . Diabetes mellitus (HCC) 08/07/2011  . Depression with anxiety 08/07/2011  . Obesity 08/07/2011  . History of TIAs 08/07/2011  . Hyperlipidemia 08/07/2011    Orientation RESPIRATION BLADDER Height & Weight     Self  Normal Incontinent Weight: 208 lb 15.9 oz (94.8 kg) Height:  5\' 2"  (157.5 cm)  BEHAVIORAL SYMPTOMS/MOOD NEUROLOGICAL BOWEL NUTRITION STATUS  Other (Comment)(NA) (NA) Incontinent Diet(carb mod)  AMBULATORY STATUS COMMUNICATION OF NEEDS Skin   Extensive Assist Verbally Skin abrasions(moisture reddness associate from  incontinent)                       Personal Care Assistance Level of Assistance  Bathing, Dressing, Feeding Bathing Assistance: Maximum assistance   Dressing Assistance: Maximum assistance     Functional Limitations Info  Sight(Left ear) Sight Info: Impaired        SPECIAL CARE FACTORS FREQUENCY  PT (By licensed PT)     PT Frequency: 3x week              Contractures Contractures Info: Not present    Additional Factors Info  Allergies, Isolation Precautions   Allergies Info: kIvp Dye (Iodinated diagnostic agent)     Isolation Precautions Info: MRSA in nose     Current Medications (11/02/2017):  This is the current hospital active medication list Current Facility-Administered Medications  Medication Dose Route Frequency Provider Last Rate Last Dose  . acetaminophen (TYLENOL) tablet 650 mg  650 mg Oral Q6H PRN Leroy Sea, MD       Or  . acetaminophen (TYLENOL) suppository 650 mg  650 mg Rectal Q6H PRN Leroy Sea, MD      . atorvastatin (LIPITOR) tablet 20 mg  20 mg Oral q1800 Leroy Sea, MD   20 mg at 11/01/17 1757  . baclofen (LIORESAL) tablet 5 mg  5 mg Oral BID Leroy Sea, MD   5 mg at 11/02/17 2841  . cefTRIAXone (ROCEPHIN) 1 g in sodium chloride 0.9 % 100 mL IVPB  1 g Intravenous Q24H Leroy Sea, MD   Stopped at 11/01/17 1516  . Chlorhexidine  Gluconate Cloth 2 % PADS 6 each  6 each Topical Q0600 Leroy Sea, MD   6 each at 11/02/17 0600  . clopidogrel (PLAVIX) tablet 75 mg  75 mg Oral Daily Leroy Sea, MD   75 mg at 11/02/17 0852  . [START ON 11/11/2017] cyanocobalamin ((VITAMIN B-12)) injection 1,000 mcg  1,000 mcg Intramuscular Q30 days Susa Raring K, MD      . dextrose 5 % 1,000 mL with potassium chloride 40 mEq infusion   Intravenous Continuous Erick Blinks, MD 100 mL/hr at 11/02/17 0600    . heparin injection 5,000 Units  5,000 Units Subcutaneous Q8H Leroy Sea, MD   5,000 Units at 11/02/17  0600  . insulin aspart (novoLOG) injection 0-5 Units  0-5 Units Subcutaneous QHS Leroy Sea, MD   2 Units at 11/01/17 2140  . insulin aspart (novoLOG) injection 0-9 Units  0-9 Units Subcutaneous TID WC Leroy Sea, MD   2 Units at 11/02/17 [phone]  . insulin glargine (LANTUS) injection 65 Units  65 Units Subcutaneous QHS Erick Blinks, MD   65 Units at 11/01/17 2140  . levothyroxine (SYNTHROID, LEVOTHROID) tablet 125 mcg  125 mcg Oral QAC breakfast Leroy Sea, MD   125 mcg at 11/02/17 0853  . metoprolol succinate (TOPROL-XL) 24 hr tablet 50 mg  50 mg Oral Daily Leroy Sea, MD   50 mg at 11/02/17 0853  . metroNIDAZOLE (FLAGYL) IVPB 500 mg  500 mg Intravenous Q8H Leroy Sea, MD   Stopped at 11/02/17 0700  . mupirocin ointment (BACTROBAN) 2 % 1 application  1 application Nasal BID Leroy Sea, MD   1 application at 11/02/17 [phone]  . pantoprazole (PROTONIX) EC tablet 40 mg  40 mg Oral Daily Leroy Sea, MD   40 mg at 11/02/17 0854  . polyethylene glycol (MIRALAX / GLYCOLAX) packet 17 g  17 g Oral Daily PRN Leroy Sea, MD         Discharge Medications: Please see discharge summary for a list of discharge medications.  Relevant Imaging Results:  Relevant Lab Results:   Additional Information SS# [id number]  Arvin Collard, [place]

## 2017-11-02 NOTE — Clinical Social Work Note (Signed)
Clinical Social Work Assessment  Patient Details  Name: Donavan FoilFaye O Botsford MRN: [id number] Date of Birth: September 25, 1950  Date of referral:  11/02/17               Reason for consult:  Facility Placement                Permission sought to share information with:  Facility Medical sales representativeContact Representative, Family Supports Permission granted to share information::  Yes, Verbal Permission Granted(Son gave verbal permission. pt not oriented x4 )  Name::     Katharine LookBilly Thomas and Yvonne KendallWanda Stevens  Agency::  yes  Relationship::  son and daughter  Contact Information:  yes  Housing/Transportation Living arrangements for the past 2 months:  Skilled Nursing [facility] Source of Information:  Adult Children Patient Interpreter Needed:  None Criminal Activity/Legal Involvement Pertinent to Current Situation/Hospitalization:  No - Comment as needed Significant Relationships:  Adult Children Lives with:  Facility Resident Do you feel safe going back to the place where you live?  Yes Need for family participation in patient care:  Yes (Comment)  Care giving concerns:  CSW received consult regarding SNF placement.  Csw spoke with pt's son.  [facility]  Pt's son would [facility].   Social Worker assessment / plan:  CSW spoke with pt's son regarding SNF placement.  Pt's  son is agreeable for placement.  Employment status:  Disabled (Comment on whether or not currently receiving Disability)(receiving disability payments) Insurance information:  Managed Medicare PT Recommendations:  Skilled Nursing Facility, Outpatient Therapies Information / Referral to community resources:  (NA)  Patient/Family's Response to care:  Pt's son  Reports agreement with discharge plan.  Patient/Family's Understanding of and Emotional Response to Diagnosis, Current Treatment, and Prognosis:  Pt's son is realistic regarding  pt's needs and expressed being hopeful for pt's return to SNF at disposition.  Pt's son understands CSW role and discharge progress as well as medical condition.  No questions/concerns about plan or treatment.  Emotional Assessment Appearance:  Appears older than stated age Attitude/Demeanor/Rapport:  (Appropriate) Affect (typically observed):  Appropriate Orientation:  Oriented to Self Alcohol / Substance use:  Not Applicable Psych involvement (Current and /or in the community):  No (Comment)  Discharge Needs  Concerns to be addressed:  No discharge needs identified Readmission within the last 30 days:  No Current discharge risk:    Barriers to Discharge:  Continued Medical Work up   [facility] Elize Pinon, LCSWA 11/02/2017, 10:33 AM

## 2017-11-02 NOTE — Progress Notes (Signed)
PROGRESS NOTE    Dawn Ho  JXB:[id number] DOB: August 29, 1950 DOA: 10/31/2017 PCP: Bernerd Limbo, MD    Brief Narrative:  67 year old female with a history of right MCA stroke, hypertension, diabetes, diastolic heart failure, who is a resident of nursing facility, presented to the hospital with altered mental status.  She was having vomiting and some loose stools.  Urinalysis indicated possible UTI.  She was noted to be dehydrated in any acute kidney injury on admission.  She was admitted for IV fluids and IV antibiotics.  Urine culture process.  Overall mental status has improved.  Plan is to return to skilled nursing facility on discharge.   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 Diarrhea.  Patient is continued to have loose stools overnight.  She has had more than 4 loose stools.  Will check stool for C. difficile. 2. Urinary tract infection.  Currently on Rocephin.  Urine culture positive for E. coli.  Follow-up sensitivities. 3. Metabolic encephalopathy due to dehydration.  Improved with IV fluids.  Mental status appears to be approaching baseline.  Continue to monitor. 4. Acute kidney injury.  Related to #1 as well as diuretic and ACE inhibitor use.  These are currently on hold.  Creatinine is improving.  Recheck in a.m. 5. History of stroke with left-sided hemiparesis.  Supportive care.  Continue Plavix and statin. 6. Chronic diastolic congestive heart failure.  Appears compensated at this time.  Continue current management. 7. Diabetes.  Blood sugars have been stable.  Continue on Lantus and sliding scale insulin. 8. Hypokalemia.  Replace.  Magnesium normal 9. Metabolic acidosis.  Likely related to diarrhea and GI losses.  Start on bicarbonate infusion.   DVT prophylaxis: Heparin Code Status:  Full code Family Communication: Discussed with son over the phone on 3/15 Disposition Plan: Discharged to skilled nursing facility once loose stools are manageable   Consultants:     Procedures:     Antimicrobials:   Rocephin 3/14 >   Subjective: Reports having frequent loose stools.  No vomiting.  Tolerating solid food.  Objective: Vitals:   11/01/17 1516 11/01/17 2044 11/02/17 0552 11/02/17 1430  BP: (!) 134/56 (!) 151/74 (!) 164/72 (!) 160/70  Pulse: 85 80 70 70  Resp: 18   16  Temp: 98.2 F (36.8 C) 98.5 F (36.9 C) 98.4 F (36.9 C) 98.5 F (36.9 C)  TempSrc: Oral Oral Oral Oral  SpO2: 95% 95% 97% 97%  Weight:      Height:        Intake/Output Summary (Last 24 hours) at 11/02/2017 1802 Last data filed at 11/02/2017 1700 Gross per 24 hour  Intake 1000 ml  Output 1200 ml  Net -200 ml   Filed Weights   10/31/17 1604 11/01/17 0521  Weight: 94.8 kg (208 lb 15.9 oz) 94.8 kg (208 lb 15.9 oz)    Examination:  General exam: Alert, awake, oriented x 3 Respiratory system: Clear to auscultation. Respiratory effort normal. Cardiovascular system:RRR. No murmurs, rubs, gallops. Gastrointestinal system: Abdomen is nondistended, soft and tender in left lower quadrant. No organomegaly or masses felt. Normal bowel sounds heard. Central nervous system: Alert and oriented. No focal neurological deficits. Extremities: No C/C/E, +pedal pulses Skin: No rashes, lesions or ulcers Psychiatry: Judgement and insight appear normal. Mood & affect appropriate.  Data Reviewed: I have personally reviewed following labs and imaging studies  CBC: Recent Labs  Lab 10/31/17 0932 11/01/17 0504  WBC 13.3* 10.4  NEUTROABS 10.2*  --   HGB 12.9 11.8*  HCT 40.8 37.9  MCV 89.5 90.0  PLT 264 246   Basic Metabolic Panel: Recent Labs  Lab 10/31/17 0932 11/01/17 0504 11/02/17 0605 11/02/17 1652  NA 144 145 147*  --   K 2.6* 3.2* 2.8*  --   CL 108 118* 119*  --   CO2 20*  17* 17*  --   GLUCOSE 201* 255* 193*  --   BUN 36* 24* 14  --   CREATININE 1.55* 1.11* 0.87  --   CALCIUM 9.2 8.5* 9.0  --   MG  --  2.0  --  1.9   GFR: Estimated Creatinine Clearance: 67.4 mL/min (by C-G formula based on SCr of 0.87 mg/dL). Liver Function Tests: Recent Labs  Lab 10/31/17 0932  AST 15  ALT 13*  ALKPHOS 70  BILITOT 1.0  PROT 7.3  ALBUMIN 3.3*   No results for input(s): LIPASE, AMYLASE in the last 168 hours. No results for input(s): AMMONIA in the last 168 hours. Coagulation Profile: No results for input(s): INR, PROTIME in the last 168 hours. Cardiac Enzymes: Recent Labs  Lab 10/31/17 0932  TROPONINI <0.03   BNP (last 3 results) No results for input(s): PROBNP in the last 8760 hours. HbA1C: Recent Labs    10/31/17 1000  HGBA1C 7.0*   CBG: Recent Labs  Lab 11/01/17 1653 11/01/17 2048 11/02/17 0735 11/02/17 1123 11/02/17 1627  GLUCAP 210* 224* 165* 195* 208*   Lipid Profile: No results for input(s): CHOL, HDL, LDLCALC, TRIG, CHOLHDL, LDLDIRECT in the last 72 hours. Thyroid Function Tests: No results for input(s): TSH, T4TOTAL, FREET4, T3FREE, THYROIDAB in the last 72 hours. Anemia Panel: No results for input(s): VITAMINB12, FOLATE, FERRITIN, TIBC, IRON, RETICCTPCT in the last 72 hours. Sepsis Labs: No results for input(s): PROCALCITON, LATICACIDVEN in the last 168 hours.  Recent Results (from the past 240 hour(s))  Urine Culture     Status: Abnormal (Preliminary result)   Collection Time: 10/31/17 10:14 AM  Result Value Ref Range Status   Specimen Description   Final    URINE, RANDOM Performed at [facility], [address] [place], [place] [address]    Special Requests   Final    NONE Performed at [facility], [address], [place], [place] [address]    Culture (A)  Final    >=100,000 COLONIES/mL ESCHERICHIA COLI SUSCEPTIBILITIES TO FOLLOW Performed at [facility], [address] [place], [place] [address]    Report  Status PENDING  Incomplete  MRSA PCR Screening     Status: Abnormal   Collection Time: 10/31/17  4:22 PM  Result Value Ref Range Status   MRSA by PCR POSITIVE (A) NEGATIVE Final    Comment:        The GeneXpert MRSA Assay (FDA approved for NASAL specimens only), is one component of a comprehensive MRSA colonization surveillance program. It is not intended to diagnose MRSA infection nor to guide or monitor treatment for MRSA infections. RESULT CALLED TO, READ BACK BY AND VERIFIED WITH: JACKSON,N AT 614PM ON [id number] BY ISLEY,B Performed at [facility], [address], [place], [place] [address]          Radiology Studies: No results found.      Scheduled Meds: . atorvastatin  20 mg Oral q1800  .  baclofen  5 mg Oral BID  . Chlorhexidine Gluconate Cloth  6 each Topical Q0600  . clopidogrel  75 mg Oral Daily  . [START ON 11/11/2017] cyanocobalamin  1,000 mcg Intramuscular Q30 days  . heparin  5,000 Units Subcutaneous Q8H  . insulin aspart  0-5 Units Subcutaneous QHS  . insulin aspart  0-9 Units Subcutaneous TID WC  . insulin glargine  65 Units Subcutaneous QHS  . levothyroxine  125 mcg Oral QAC breakfast  . metoprolol succinate  50 mg Oral Daily  . mupirocin ointment  1 application Nasal BID  . pantoprazole  40 mg Oral Daily  . potassium chloride  40 mEq Oral Q3H   Continuous Infusions: . dextrose 5 % 1,000 mL with potassium chloride 40 mEq, sodium bicarbonate 150 mEq infusion    . metronidazole Stopped (11/02/17 1451)     LOS: 0 days    Time spent:    Erick Blinks, MD [facility] Pager [phone]  If 7PM-7AM, please contact night-coverage [email or web address] Password [facility] 11/02/2017, 6:02 PM

## 2017-11-03 DIAGNOSIS — A09 Infectious gastroenteritis and colitis, unspecified: Secondary | ICD-10-CM | POA: Diagnosis not present

## 2017-11-03 DIAGNOSIS — N3000 Acute cystitis without hematuria: Secondary | ICD-10-CM | POA: Diagnosis not present

## 2017-11-03 DIAGNOSIS — R0603 Acute respiratory distress: Secondary | ICD-10-CM | POA: Diagnosis not present

## 2017-11-03 DIAGNOSIS — E0810 Diabetes mellitus due to underlying condition with ketoacidosis without coma: Secondary | ICD-10-CM | POA: Diagnosis not present

## 2017-11-03 DIAGNOSIS — R140 Abdominal distension (gaseous): Secondary | ICD-10-CM | POA: Diagnosis not present

## 2017-11-03 DIAGNOSIS — D72829 Elevated white blood cell count, unspecified: Secondary | ICD-10-CM | POA: Diagnosis not present

## 2017-11-03 DIAGNOSIS — K567 Ileus, unspecified: Secondary | ICD-10-CM | POA: Diagnosis not present

## 2017-11-03 DIAGNOSIS — K56609 Unspecified intestinal obstruction, unspecified as to partial versus complete obstruction: Secondary | ICD-10-CM | POA: Diagnosis not present

## 2017-11-03 DIAGNOSIS — K56600 Partial intestinal obstruction, unspecified as to cause: Secondary | ICD-10-CM | POA: Diagnosis not present

## 2017-11-03 DIAGNOSIS — T17908D Unspecified foreign body in respiratory tract, part unspecified causing other injury, subsequent encounter: Secondary | ICD-10-CM | POA: Diagnosis not present

## 2017-11-03 DIAGNOSIS — E876 Hypokalemia: Secondary | ICD-10-CM | POA: Diagnosis not present

## 2017-11-03 DIAGNOSIS — E7800 Pure hypercholesterolemia, unspecified: Secondary | ICD-10-CM | POA: Diagnosis not present

## 2017-11-03 LAB — GLUCOSE, CAPILLARY
GLUCOSE-CAPILLARY: 144 mg/dL — AB (ref 65–99)
Glucose-Capillary: 131 mg/dL — ABNORMAL HIGH (ref 65–99)
Glucose-Capillary: 176 mg/dL — ABNORMAL HIGH (ref 65–99)
Glucose-Capillary: 148 mg/dL — ABNORMAL HIGH (ref 65–99)

## 2017-11-03 LAB — BASIC METABOLIC PANEL
Anion gap: 12 (ref 5–15)
BUN: 12 mg/dL (ref 6–20)
CALCIUM: 9 mg/dL (ref 8.9–10.3)
CO2: 18 mmol/L — ABNORMAL LOW (ref 22–32)
CREATININE: 0.93 mg/dL (ref 0.44–1.00)
Chloride: 121 mmol/L — ABNORMAL HIGH (ref 101–111)
GFR calc Af Amer: >60 mL/min (ref >60)
GLUCOSE: 163 mg/dL — AB (ref 65–99)
POTASSIUM: 2.4 mmol/L — AB (ref 3.5–5.1)
Sodium: 151 mmol/L — ABNORMAL HIGH (ref 135–145)

## 2017-11-03 LAB — CBC
HCT: 39.5 % (ref 36.0–46.0)
Hemoglobin: 12.6 g/dL (ref 12.0–15.0)
MCH: 28.1 pg (ref 26.0–34.0)
MCHC: 31.9 g/dL (ref 30.0–36.0)
MCV: 88.2 fL (ref 78.0–100.0)
Platelets: 281 10*3/uL (ref 150–400)
RBC: 4.48 MIL/uL (ref 3.87–5.11)
RDW: 15.1 % (ref 11.5–15.5)
WBC: 12.3 10*3/uL — ABNORMAL HIGH (ref 4.0–10.5)

## 2017-11-03 LAB — URINE CULTURE: Culture: >=100000 — AB

## 2017-11-03 MED ORDER — DIPHENOXYLATE-ATROPINE 2.5-0.025 MG PO TABS
2.0000 | ORAL_TABLET | Freq: Four times a day (QID) | ORAL | Status: DC
  Administered 2017-11-03 – 2017-11-04 (×7): 2 via ORAL
  Filled 2017-11-03 (×7): qty 2

## 2017-11-03 MED ORDER — POTASSIUM CHLORIDE CRYS ER 20 MEQ PO TBCR
60.0000 meq | EXTENDED_RELEASE_TABLET | Freq: Once | ORAL | Status: AC
  Administered 2017-11-03: 60 meq via ORAL

## 2017-11-03 MED ORDER — POTASSIUM CHLORIDE 10 MEQ/100ML IV SOLN
10.0000 meq | INTRAVENOUS | Status: AC
  Administered 2017-11-03 (×4): 10 meq via INTRAVENOUS
  Filled 2017-11-03 (×4): qty 100

## 2017-11-03 MED ORDER — POTASSIUM CHLORIDE IN NACL 20-0.45 MEQ/L-% IV SOLN
INTRAVENOUS | Status: DC
  Administered 2017-11-03 – 2017-11-06 (×5): via INTRAVENOUS
  Filled 2017-11-03 (×10): qty 1000

## 2017-11-03 NOTE — Progress Notes (Signed)
PROGRESS NOTE    Dawn Ho  WGN:[id number] DOB: August 17, 1951 DOA: 10/31/2017 PCP: Bernerd Limbo, MD    Brief Narrative:  68 year old female with a history of right MCA stroke, hypertension, diabetes, diastolic heart failure, who is a resident of nursing facility, presented to the hospital with altered mental status.  She was having vomiting and some loose stools.  Urinalysis indicated possible UTI.  She was noted to be dehydrated in any acute kidney injury on admission.  She was admitted for IV fluids and IV antibiotics.  Urine culture process.  Overall mental status has improved.  Plan is to return to skilled nursing facility on discharge.   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 Diarrhea.  Patient continues to have loose stools. stool was tested negative for C. Difficile. Will add lomotil.  2. Urinary tract infection.  Treated with 3 doses of Rocephin.  Urine culture positive for E. Coli. Follow up C/S.   3. Metabolic encephalopathy due to dehydration.  Improved with IV fluids.  Mental status appears to be approaching baseline.  Continue to monitor. 4. Hypernatremia - change IVFs to 0.45 NS K, recheck in AM.  5. Acute kidney injury.  Related to #1 as well as diuretic and ACE inhibitor use.  These are currently on hold.  Creatinine is improved.  Recheck in a.m. 6. History of stroke with left-sided hemiparesis.  Supportive care.  Continue Plavix and statin. 7. Chronic diastolic congestive heart failure.  Appears compensated at this time.  Continue current management. 8. Diabetes.  Blood sugars have been stable.  Continue on Lantus and sliding scale insulin. 9. Hypokalemia.  Replace.  Magnesium normal 10. Metabolic acidosis.  Likely related to diarrhea and GI losses.  Continue IVF hydration.   DVT  prophylaxis: Heparin Code Status: Full code Family Communication: Discussed with son over the phone on 3/15 Disposition Plan: Discharged to skilled nursing facility once loose stools are manageable   Antimicrobials:   Rocephin 3/14 >3/17   Subjective: Pt continues to have loose stools.  No vomiting.  Tolerating solid food.  Objective: Vitals:   11/02/17 0552 11/02/17 1430 11/02/17 2108 11/03/17 0504  BP: (!) 164/72 (!) 160/70 (!) 157/76 (!) 173/92  Pulse: 70 70 79 81  Resp:  16 16 16   Temp: 98.4 F (36.9 C) 98.5 F (36.9 C) 98.2 F (36.8 C) 98.4 F (36.9 C)  TempSrc: Oral Oral Oral Oral  SpO2: 97% 97% 94% 94%  Weight:      Height:        Intake/Output Summary (Last 24 hours) at 11/03/2017 1225 Last data filed at 11/03/2017 0900 Gross per 24 hour  Intake 2295 ml  Output 800 ml  Net 1495 ml   Filed Weights   10/31/17 1604 11/01/17 0521  Weight: 94.8 kg (208 lb 15.9 oz) 94.8 kg (208 lb 15.9 oz)    Examination:  General exam: Alert, awake, oriented x 3 Respiratory system: Clear to auscultation. Respiratory effort normal. Cardiovascular system:RRR. No murmurs, rubs, gallops. Gastrointestinal system: Abdomen is nondistended, soft and tender in left lower quadrant. No organomegaly or masses felt. Normal bowel sounds heard. Central nervous system: Alert and oriented. No focal neurological deficits. Extremities: No C/C/E, +pedal pulses Skin: No rashes, lesions or ulcers Psychiatry: Judgement and insight appear normal. Mood & affect appropriate.  Data Reviewed: I have personally reviewed following labs and imaging studies  CBC: Recent Labs  Lab 10/31/17 0932 11/01/17 0504 11/03/17 0559  WBC 13.3* 10.4 12.3*  NEUTROABS 10.2*  --   --   HGB 12.9 11.8* 12.6  HCT 40.8 37.9 39.5  MCV 89.5 90.0 88.2  PLT 264 246 281   Basic Metabolic Panel: Recent Labs  Lab 10/31/17 0932 11/01/17 0504 11/02/17 0605 11/02/17 1652 11/03/17 0559  NA 144 145 147*  --  151*  K  2.6* 3.2* 2.8*  --  2.4*  CL 108 118* 119*  --  121*  CO2 20* 17* 17*  --  18*  GLUCOSE 201* 255* 193*  --  163*  BUN 36* 24* 14  --  12  CREATININE 1.55* 1.11* 0.87  --  0.93  CALCIUM 9.2 8.5* 9.0  --  9.0  MG  --  2.0  --  1.9  --    GFR: Estimated Creatinine Clearance: 63 mL/min (by C-G formula based on SCr of 0.93 mg/dL). Liver Function Tests: Recent Labs  Lab 10/31/17 0932  AST 15  ALT 13*  ALKPHOS 70  BILITOT 1.0  PROT 7.3  ALBUMIN 3.3*   No results for input(s): LIPASE, AMYLASE in the last 168 hours. No results for input(s): AMMONIA in the last 168 hours. Coagulation Profile: No results for input(s): INR, PROTIME in the last 168 hours. Cardiac Enzymes: Recent Labs  Lab 10/31/17 0932  TROPONINI <0.03   BNP (last 3 results) No results for input(s): PROBNP in the last 8760 hours. HbA1C: No results for input(s): HGBA1C in the last 72 hours. CBG: Recent Labs  Lab 11/02/17 1627 11/02/17 1940 11/02/17 2102 11/03/17 0718 11/03/17 1150  GLUCAP 208* 390* 227* 131* 176*   Lipid Profile: No results for input(s): CHOL, HDL, LDLCALC, TRIG, CHOLHDL, LDLDIRECT in the last 72 hours. Thyroid Function Tests: No results for input(s): TSH, T4TOTAL, FREET4, T3FREE, THYROIDAB in the last 72 hours. Anemia Panel: No results for input(s): VITAMINB12, FOLATE, FERRITIN, TIBC, IRON, RETICCTPCT in the last 72 hours. Sepsis Labs: No results for input(s): PROCALCITON, LATICACIDVEN in the last 168 hours.  Recent Results (from the past 240 hour(s))  Urine Culture     Status: Abnormal   Collection Time: 10/31/17 10:14 AM  Result Value Ref Range Status   Specimen Description   Final    URINE, RANDOM Performed at [facility], [address], [place], [place] [address]    Special Requests   Final    NONE Performed at [facility], [address] [place], [place] [address]    Culture >=100,000 COLONIES/mL ESCHERICHIA COLI (A)  Final   Report Status 11/03/2017 FINAL  Final    Organism ID, Bacteria ESCHERICHIA COLI (A)  Final      Susceptibility   Escherichia coli - MIC*    AMPICILLIN >=32 RESISTANT Resistant     CEFAZOLIN <=4 SENSITIVE Sensitive     CEFTRIAXONE <=1 SENSITIVE Sensitive     CIPROFLOXACIN <=0.25 SENSITIVE Sensitive     GENTAMICIN >=16 RESISTANT Resistant     IMIPENEM <=0.25 SENSITIVE Sensitive     NITROFURANTOIN <=16 SENSITIVE Sensitive     TRIMETH/SULFA <=20 SENSITIVE Sensitive     AMPICILLIN/SULBACTAM 16 INTERMEDIATE Intermediate     PIP/TAZO <=4 SENSITIVE Sensitive     Extended ESBL NEGATIVE Sensitive     * >=100,000 COLONIES/mL ESCHERICHIA COLI  MRSA PCR Screening     Status: Abnormal   Collection Time: 10/31/17  4:22 PM  Result Value Ref Range Status   MRSA by PCR POSITIVE (A) NEGATIVE Final    Comment:        The GeneXpert MRSA Assay (FDA approved for NASAL specimens only), is one component of a comprehensive MRSA colonization surveillance program. It is not intended to diagnose MRSA infection nor to guide or monitor treatment for MRSA infections. RESULT CALLED TO, READ BACK BY AND VERIFIED WITH: JACKSON,N AT 614PM ON [phone] BY ISLEY,B Performed at [facility], [address], [place], [place] [address]   C difficile quick scan w PCR reflex     Status: None   Collection Time: 11/02/17  6:34 PM  Result Value Ref Range Status   C Diff antigen NEGATIVE NEGATIVE Final   C Diff toxin NEGATIVE NEGATIVE Final   C Diff interpretation No C. difficile detected.  Final    Comment: Performed at [facility], [address], [place], [place] [address]    Radiology Studies: No results found.  Scheduled Meds: . atorvastatin  20 mg Oral q1800  . baclofen  5 mg Oral BID  . Chlorhexidine Gluconate Cloth  6 each Topical Q0600  . clopidogrel  75 mg Oral Daily  . [START ON 11/11/2017] cyanocobalamin  1,000 mcg Intramuscular Q30 days  . diphenoxylate-atropine  2 tablet Oral QID  . heparin  5,000 Units Subcutaneous Q8H  . insulin aspart   0-5 Units Subcutaneous QHS  . insulin aspart  0-9 Units Subcutaneous TID WC  . insulin glargine  65 Units Subcutaneous QHS  . levothyroxine  125 mcg Oral QAC breakfast  . metoprolol succinate  50 mg Oral Daily  . mupirocin ointment  1 application Nasal BID  . pantoprazole  40 mg Oral Daily   Continuous Infusions: . dextrose 5 % 1,000 mL with potassium chloride 20 mEq, sodium bicarbonate 150 mEq infusion 100 mL/hr at 11/03/17 1000  . metronidazole Stopped (11/03/17 0717)     LOS: 0 days    Time spent:  Standley Dakins, MD [facility] Pager [phone]  If 7PM-7AM, please contact night-coverage [email or web address] Password [facility] 11/03/2017, 12:25 PM

## 2017-11-04 ENCOUNTER — Encounter (HOSPITAL_COMMUNITY): Payer: Self-pay | Admitting: Gastroenterology

## 2017-11-04 DIAGNOSIS — R140 Abdominal distension (gaseous): Secondary | ICD-10-CM | POA: Diagnosis not present

## 2017-11-04 DIAGNOSIS — M797 Fibromyalgia: Secondary | ICD-10-CM | POA: Diagnosis present

## 2017-11-04 DIAGNOSIS — E876 Hypokalemia: Secondary | ICD-10-CM | POA: Diagnosis present

## 2017-11-04 DIAGNOSIS — I5032 Chronic diastolic (congestive) heart failure: Secondary | ICD-10-CM | POA: Diagnosis present

## 2017-11-04 DIAGNOSIS — E7800 Pure hypercholesterolemia, unspecified: Secondary | ICD-10-CM | POA: Diagnosis not present

## 2017-11-04 DIAGNOSIS — I110 Hypertensive heart disease with heart failure: Secondary | ICD-10-CM | POA: Diagnosis present

## 2017-11-04 DIAGNOSIS — Z90710 Acquired absence of both cervix and uterus: Secondary | ICD-10-CM | POA: Diagnosis not present

## 2017-11-04 DIAGNOSIS — E119 Type 2 diabetes mellitus without complications: Secondary | ICD-10-CM | POA: Diagnosis present

## 2017-11-04 DIAGNOSIS — K56600 Partial intestinal obstruction, unspecified as to cause: Secondary | ICD-10-CM | POA: Diagnosis not present

## 2017-11-04 DIAGNOSIS — K56609 Unspecified intestinal obstruction, unspecified as to partial versus complete obstruction: Secondary | ICD-10-CM | POA: Diagnosis not present

## 2017-11-04 DIAGNOSIS — I6340 Cerebral infarction due to embolism of unspecified cerebral artery: Secondary | ICD-10-CM | POA: Diagnosis not present

## 2017-11-04 DIAGNOSIS — Z993 Dependence on wheelchair: Secondary | ICD-10-CM | POA: Diagnosis not present

## 2017-11-04 DIAGNOSIS — R0603 Acute respiratory distress: Secondary | ICD-10-CM | POA: Diagnosis not present

## 2017-11-04 DIAGNOSIS — N3000 Acute cystitis without hematuria: Secondary | ICD-10-CM | POA: Diagnosis not present

## 2017-11-04 DIAGNOSIS — E785 Hyperlipidemia, unspecified: Secondary | ICD-10-CM | POA: Diagnosis present

## 2017-11-04 DIAGNOSIS — N390 Urinary tract infection, site not specified: Secondary | ICD-10-CM | POA: Diagnosis present

## 2017-11-04 DIAGNOSIS — A09 Infectious gastroenteritis and colitis, unspecified: Secondary | ICD-10-CM | POA: Diagnosis not present

## 2017-11-04 DIAGNOSIS — D72829 Elevated white blood cell count, unspecified: Secondary | ICD-10-CM | POA: Diagnosis not present

## 2017-11-04 DIAGNOSIS — E872 Acidosis: Secondary | ICD-10-CM | POA: Diagnosis present

## 2017-11-04 DIAGNOSIS — R0989 Other specified symptoms and signs involving the circulatory and respiratory systems: Secondary | ICD-10-CM | POA: Diagnosis not present

## 2017-11-04 DIAGNOSIS — I69354 Hemiplegia and hemiparesis following cerebral infarction affecting left non-dominant side: Secondary | ICD-10-CM | POA: Diagnosis not present

## 2017-11-04 DIAGNOSIS — B9620 Unspecified Escherichia coli [E. coli] as the cause of diseases classified elsewhere: Secondary | ICD-10-CM | POA: Diagnosis present

## 2017-11-04 DIAGNOSIS — J9601 Acute respiratory failure with hypoxia: Secondary | ICD-10-CM | POA: Diagnosis not present

## 2017-11-04 DIAGNOSIS — Z9049 Acquired absence of other specified parts of digestive tract: Secondary | ICD-10-CM | POA: Diagnosis not present

## 2017-11-04 DIAGNOSIS — Z6841 Body Mass Index (BMI) 40.0 and over, adult: Secondary | ICD-10-CM | POA: Diagnosis not present

## 2017-11-04 DIAGNOSIS — G9341 Metabolic encephalopathy: Secondary | ICD-10-CM | POA: Diagnosis present

## 2017-11-04 DIAGNOSIS — K567 Ileus, unspecified: Secondary | ICD-10-CM | POA: Diagnosis not present

## 2017-11-04 DIAGNOSIS — I452 Bifascicular block: Secondary | ICD-10-CM | POA: Diagnosis present

## 2017-11-04 DIAGNOSIS — R197 Diarrhea, unspecified: Secondary | ICD-10-CM

## 2017-11-04 DIAGNOSIS — E0810 Diabetes mellitus due to underlying condition with ketoacidosis without coma: Secondary | ICD-10-CM | POA: Diagnosis not present

## 2017-11-04 DIAGNOSIS — T17908D Unspecified foreign body in respiratory tract, part unspecified causing other injury, subsequent encounter: Secondary | ICD-10-CM | POA: Diagnosis not present

## 2017-11-04 DIAGNOSIS — N179 Acute kidney failure, unspecified: Secondary | ICD-10-CM | POA: Diagnosis present

## 2017-11-04 DIAGNOSIS — J690 Pneumonitis due to inhalation of food and vomit: Secondary | ICD-10-CM | POA: Diagnosis not present

## 2017-11-04 DIAGNOSIS — I639 Cerebral infarction, unspecified: Secondary | ICD-10-CM | POA: Diagnosis not present

## 2017-11-04 DIAGNOSIS — J9811 Atelectasis: Secondary | ICD-10-CM | POA: Diagnosis not present

## 2017-11-04 DIAGNOSIS — E870 Hyperosmolality and hypernatremia: Secondary | ICD-10-CM | POA: Diagnosis not present

## 2017-11-04 LAB — BASIC METABOLIC PANEL
ANION GAP: 10 (ref 5–15)
BUN: 11 mg/dL (ref 6–20)
CHLORIDE: 117 mmol/L — AB (ref 101–111)
CO2: 17 mmol/L — ABNORMAL LOW (ref 22–32)
Calcium: 8.7 mg/dL — ABNORMAL LOW (ref 8.9–10.3)
Creatinine, Ser: 0.95 mg/dL (ref 0.44–1.00)
GFR calc Af Amer: >60 mL/min (ref >60)
GFR calc non Af Amer: >60 mL/min (ref >60)
GLUCOSE: 96 mg/dL (ref 65–99)
POTASSIUM: 2.7 mmol/L — AB (ref 3.5–5.1)
Sodium: 144 mmol/L (ref 135–145)

## 2017-11-04 LAB — MAGNESIUM: MAGNESIUM: 1.7 mg/dL (ref 1.7–2.4)

## 2017-11-04 LAB — GLUCOSE, CAPILLARY
GLUCOSE-CAPILLARY: 98 mg/dL (ref 65–99)
GLUCOSE-CAPILLARY: 119 mg/dL — AB (ref 65–99)
Glucose-Capillary: 149 mg/dL — ABNORMAL HIGH (ref 65–99)
Glucose-Capillary: 93 mg/dL (ref 65–99)

## 2017-11-04 MED ORDER — MAGNESIUM SULFATE 2 GM/50ML IV SOLN
2.0000 g | Freq: Once | INTRAVENOUS | Status: AC
  Administered 2017-11-04: 2 g via INTRAVENOUS
  Filled 2017-11-04: qty 50

## 2017-11-04 MED ORDER — POTASSIUM CHLORIDE CRYS ER 20 MEQ PO TBCR
60.0000 meq | EXTENDED_RELEASE_TABLET | Freq: Once | ORAL | Status: AC
  Administered 2017-11-04: 60 meq via ORAL
  Filled 2017-11-04: qty 3

## 2017-11-04 MED ORDER — HYDRALAZINE HCL 20 MG/ML IJ SOLN
10.0000 mg | INTRAMUSCULAR | Status: DC | PRN
  Administered 2017-11-05: 10 mg via INTRAVENOUS
  Filled 2017-11-04: qty 1

## 2017-11-04 NOTE — Progress Notes (Signed)
CRITICAL VALUE ALERT  Critical Value:  2.7 Potassium  Date & Time Notied: 0645 11/04/17  Provider Notified: Laural BenesJohnson, MD.  Orders Received/Actions taken: 60 mEq PO Potassium added.

## 2017-11-04 NOTE — Progress Notes (Signed)
Physical Therapy Treatment Patient Details Name: Dawn Ho MRN: [id number] DOB: 07-Jul-1951 Today's Date: 11/04/2017    History of Present Illness Dawn Ho  is a 67 y.o. 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  I was called to admit.  By the time I seen her she has received antibiotics and IV fluids with moderate improvement.  Now able to answer questions and follow some commands.  Denies any subjective complaints other than lethargy and generalized weakness.    PT Comments    PT fatigued after exercises.  Sat on edge of bed but pt could not keep her balance therefore transfers were not attempted   Follow Up Recommendations  SNF     Equipment Recommendations  None recommended by PT    Recommendations for Other Services       Precautions / Restrictions Precautions Precautions: Fall Restrictions Weight Bearing Restrictions: No    Mobility  Bed Mobility Overal bed mobility: Needs Assistance Bed Mobility: Supine to Sit;Sit to Supine     Supine to sit: Max assist Sit to supine: Max assist   General bed mobility comments: Unable to sit greater than 2 seconds on own therefore transfers were not attempted.  Pt states she is to tired   Transfers                     Cognition  Alert and oriented                                            Exercises General Exercises - Lower Extremity Ankle Circles/Pumps: Both;10 reps Gluteal Sets: Both;10  reps Heel Slides: Both;10 reps Hip ABduction/ADduction: Both;10 reps        Pertinent Vitals/Pain None verbalized     Home Living  SNF                    Prior Function   transfer with hemiwalker          PT Goals (current goals can now be found in the care plan section)      Frequency    Min 3X/week      PT Plan      Co-evaluation              AM-PAC PT "6 Clicks" Daily Activity  Outcome Measure  Difficulty turning over in bed (including adjusting bedclothes, sheets and blankets)?: A Lot Difficulty moving from lying on back to sitting on the side of the bed? : A Lot Difficulty sitting down on and standing up from a chair with arms (e.g., wheelchair, bedside commode, etc,.)?: A Lot Help needed moving to and from a bed to chair (including a wheelchair)?: A Lot Help needed walking in hospital room?: Total Help needed climbing 3-5 steps with a railing? : Total 6 Click Score: 10    End of Session  Activity Tolerance: Patient limited by fatigue Patient left: in bed;with call bell/phone within reach;with bed alarm set Nurse Communication: Mobility status;Other (comment)(nursing staff notified that perwick needs to be replaced due to soided) PT Visit Diagnosis: Unsteadiness on feet (R26.81);Other abnormalities of gait and mobility (R26.89);Muscle weakness (generalized) (M62.81)     Time: 1410-1440 PT Time Calculation (min) (ACUTE ONLY): 30 min  Charges: there ex x 2                       Virgina Organ, PT CLT [phone] 11/04/2017, 2:47 PM

## 2017-11-04 NOTE — Progress Notes (Signed)
PROGRESS NOTE    Dawn Ho  WUJ:[id number] DOB: 08-20-1951 DOA: 10/31/2017 PCP: Bernerd Limbo, MD    Brief Narrative:  67 year old female with a history of right MCA stroke, hypertension, diabetes, diastolic heart failure, who is a resident of nursing facility, presented to the hospital with altered mental status.  She was having vomiting and some loose stools.  Urinalysis indicated possible UTI.  She was noted to be dehydrated in any acute kidney injury on admission.  She was admitted for IV fluids and IV antibiotics.  Urine culture process.  Overall mental status has improved.  Plan is to return to skilled nursing facility on discharge.  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 Diarrhea.  Patient reports that stools have slowed down on lomotil.  stool was tested negative for C. Difficile. GI recommend getting stool panel if further diarrhea occurs.   2. Urinary tract infection.  Treated with 3 doses of Rocephin.  Urine culture positive for E. Coli. Follow up C/S.   3. Metabolic encephalopathy due to dehydration.  Improved with IV fluids.  Mental status appears to be approaching baseline.  Continue to monitor. 4. Hypernatremia - changed IVFs to 0.45 NS K, recheck in AM.  5. Acute kidney injury.  Related to #1 as well as diuretic and ACE inhibitor use.  These are currently on hold.  Creatinine is improved.  Recheck in a.m. 6. History of stroke with left-sided hemiparesis.  Supportive care.  Continue Plavix and statin. 7. Chronic diastolic congestive heart failure.  Appears compensated at this time.  Continue current management. 8. Diabetes.  Blood sugars have been stable.  Continue on Lantus and sliding scale insulin. 9. Hypokalemia.  Replacing orally and in IVFs.  Magnesium replacement ordered  as well.  10. Metabolic acidosis.  Likely related to diarrhea and GI losses.  Continue IVF hydration.   DVT prophylaxis: Heparin Code Status: Full code Family Communication: Discussed with son over the phone on 3/15 Disposition Plan: Discharged to skilled nursing facility once loose stools are manageable  Antimicrobials:   Rocephin 3/14 >3/17  Subjective: Pt reports that the loose stools have improved. No vomiting.  Tolerating solid food.  Objective: Vitals:   11/03/17 0504 11/03/17 1500 11/03/17 2106 11/04/17 0510  BP: (!) 173/92 (!) 170/94 (!) 177/94 (!) 168/90  Pulse: 81 87 81 77  Resp: 16 16 16 16   Temp: 98.4 F (36.9 C) 98.6 F (37 C) 99.1 F (37.3 C) 98.5 F (36.9 C)  TempSrc: Oral Oral Oral Oral  SpO2: 94% 98% 95% 94%  Weight:      Height:        Intake/Output Summary (Last 24 hours) at 11/04/2017 1219 Last data filed at 11/04/2017 0900 Gross per 24 hour  Intake 3142.5 ml  Output -  Net 3142.5 ml   Filed Weights   10/31/17 1604 11/01/17 0521  Weight: 94.8 kg (208 lb 15.9 oz) 94.8 kg (208 lb 15.9 oz)    Examination:  General exam: Alert, awake, oriented x 3 Respiratory system: Clear to auscultation. Respiratory effort normal. Cardiovascular system: normal s1, s2 sounds.  No murmurs, rubs, gallops. Gastrointestinal system: Abdomen is nondistended, soft and tender in left lower quadrant. No organomegaly or masses felt. Normal bowel sounds heard. Central nervous system: Alert and oriented. No focal  neurological deficits. Extremities: No C/C/E, +pedal pulses Skin: No rashes, lesions or ulcers Psychiatry: Judgement and insight appear normal. Mood & affect appropriate.   Data Reviewed: I have personally reviewed following labs and imaging studies  CBC: Recent Labs  Lab 10/31/17 0932 11/01/17 0504 11/03/17 0559  WBC 13.3* 10.4 12.3*  NEUTROABS 10.2*  --   --   HGB 12.9 11.8* 12.6  HCT 40.8 37.9 39.5  MCV 89.5 90.0 88.2  PLT 264 246 281   Basic  Metabolic Panel: Recent Labs  Lab 10/31/17 0932 11/01/17 0504 11/02/17 0605 11/02/17 1652 11/03/17 0559 11/04/17 0531  NA 144 145 147*  --  151* 144  K 2.6* 3.2* 2.8*  --  2.4* 2.7*  CL 108 118* 119*  --  121* 117*  CO2 20* 17* 17*  --  18* 17*  GLUCOSE 201* 255* 193*  --  163* 96  BUN 36* 24* 14  --  12 11  CREATININE 1.55* 1.11* 0.87  --  0.93 0.95  CALCIUM 9.2 8.5* 9.0  --  9.0 8.7*  MG  --  2.0  --  1.9  --  1.7   GFR: Estimated Creatinine Clearance: 61.7 mL/min (by C-G formula based on SCr of 0.95 mg/dL). Liver Function Tests: Recent Labs  Lab 10/31/17 0932  AST 15  ALT 13*  ALKPHOS 70  BILITOT 1.0  PROT 7.3  ALBUMIN 3.3*   No results for input(s): LIPASE, AMYLASE in the last 168 hours. No results for input(s): AMMONIA in the last 168 hours. Coagulation Profile: No results for input(s): INR, PROTIME in the last 168 hours. Cardiac Enzymes: Recent Labs  Lab 10/31/17 0932  TROPONINI <0.03   BNP (last 3 results) No results for input(s): PROBNP in the last 8760 hours. HbA1C: No results for input(s): HGBA1C in the last 72 hours. CBG: Recent Labs  Lab 11/03/17 1150 11/03/17 1642 11/03/17 2125 11/04/17 0725 11/04/17 1134  GLUCAP 176* 148* 144* 98 149*   Lipid Profile: No results for input(s): CHOL, HDL, LDLCALC, TRIG, CHOLHDL, LDLDIRECT in the last 72 hours. Thyroid Function Tests: No results for input(s): TSH, T4TOTAL, FREET4, T3FREE, THYROIDAB in the last 72 hours. Anemia Panel: No results for input(s): VITAMINB12, FOLATE, FERRITIN, TIBC, IRON, RETICCTPCT in the last 72 hours. Sepsis Labs: No results for input(s): PROCALCITON, LATICACIDVEN in the last 168 hours.  Recent Results (from the past 240 hour(s))  Urine Culture     Status: Abnormal   Collection Time: 10/31/17 10:14 AM  Result Value Ref Range Status   Specimen Description   Final    URINE, RANDOM Performed at [facility], [address] [place], [place] [address]    Special Requests    Final    NONE Performed at [facility], [address] [place], [place] [address]    Culture >=100,000 COLONIES/mL ESCHERICHIA COLI (A)  Final   Report Status 11/03/2017 FINAL  Final   Organism ID, Bacteria ESCHERICHIA COLI (A)  Final      Susceptibility   Escherichia coli - MIC*    AMPICILLIN >=32 RESISTANT Resistant     CEFAZOLIN <=4 SENSITIVE Sensitive     CEFTRIAXONE <=1 SENSITIVE Sensitive     CIPROFLOXACIN <=0.25 SENSITIVE Sensitive     GENTAMICIN >=16 RESISTANT Resistant     IMIPENEM <=0.25 SENSITIVE Sensitive     NITROFURANTOIN <=16 SENSITIVE Sensitive     TRIMETH/SULFA <=20 SENSITIVE Sensitive     AMPICILLIN/SULBACTAM 16 INTERMEDIATE Intermediate     PIP/TAZO <=4 SENSITIVE  Sensitive     Extended ESBL NEGATIVE Sensitive     * >=100,000 COLONIES/mL ESCHERICHIA COLI  MRSA PCR Screening     Status: Abnormal   Collection Time: 10/31/17  4:22 PM  Result Value Ref Range Status   MRSA by PCR POSITIVE (A) NEGATIVE Final    Comment:        The GeneXpert MRSA Assay (FDA approved for NASAL specimens only), is one component of a comprehensive MRSA colonization surveillance program. It is not intended to diagnose MRSA infection nor to guide or monitor treatment for MRSA infections. RESULT CALLED TO, READ BACK BY AND VERIFIED WITH: JACKSON,N AT 614PM ON [id number] BY ISLEY,B Performed at [facility], [address], [place], [place] [address]   C difficile quick scan w PCR reflex     Status: None   Collection Time: 11/02/17  6:34 PM  Result Value Ref Range Status   C Diff antigen NEGATIVE NEGATIVE Final   C Diff toxin NEGATIVE NEGATIVE Final   C Diff interpretation No C. difficile detected.  Final    Comment: Performed at [facility], [address], [place], [place] [address]    Radiology Studies: No results found.  Scheduled Meds: . atorvastatin  20 mg Oral q1800  . baclofen  5 mg Oral BID  . Chlorhexidine Gluconate Cloth  6 each Topical Q0600  . clopidogrel  75 mg  Oral Daily  . [START ON 11/11/2017] cyanocobalamin  1,000 mcg Intramuscular Q30 days  . diphenoxylate-atropine  2 tablet Oral QID  . heparin  5,000 Units Subcutaneous Q8H  . insulin aspart  0-5 Units Subcutaneous QHS  . insulin aspart  0-9 Units Subcutaneous TID WC  . insulin glargine  65 Units Subcutaneous QHS  . levothyroxine  125 mcg Oral QAC breakfast  . metoprolol succinate  50 mg Oral Daily  . mupirocin ointment  1 application Nasal BID  . pantoprazole  40 mg Oral Daily   Continuous Infusions: . 0.45 % NaCl with KCl 20 mEq / L 125 mL/hr at 11/04/17 0630     LOS: 0 days    Time spent:  Standley Dakins, MD [facility] Pager [phone]  If 7PM-7AM, please contact night-coverage [email or web address] Password [facility] 11/04/2017, 12:19 PM

## 2017-11-04 NOTE — Consult Note (Addendum)
Referring Provider: Dr. Laural BenesJohnson  Primary Care Physician:  Bernerd LimboAriza, Fernando Enrique, MD Primary Gastroenterologist:  Dr. Jena Gaussourk   Date of Admission: 10/31/17 Date of Consultation: 11/04/17  Reason for Consultation:  Uncontrolled diarrhea   HPI:  Dawn Ho is a 67 y.o. year old female who [facility] with altered [facility] status, vomiting, and diarrhea. UA with possible UTI, treated with Rocephin. She initially responded well with supportive measures, with resolution of diarrhea the following day after admission. However, later that evening, she had persistent loose stools, greater than 4. Due to persistent diarrhea, Cdiff was checked and both antigen and toxin negative. She has had issues with hypokalemia this admission. Lomotil was added yesterday by hospitalist after negative CDI on file. Mild leukocytosis on admission, improving.   History obtained from patient, who states she had symptoms for a week. Noted LLQ pain. Limited historian. However, she feels improved since admission. Per documentation 5 stools yesterday. Denies any recent antibiotic exposure .Patient notes these were "soft". No N/V currently. No dysphagia. Baseline bowel habits one per day. Appetite is "picking up". No rectal bleeding. Tolerating carb modified diet.   States she had a colon[place] several years ago in [place]; however, there is no record of this.    Past Medical History:  Diagnosis Date  . CVA (cerebral infarction)   . Depression with anxiety   . Diabetes mellitus   . Dyslipidemia   . Fibromyalgia   . Hypertension   . TIA (transient ischemic attack)     Past Surgical History:  Procedure Laterality Date  . ABDOMINAL HYSTERECTOMY     age 67, "removed one ovary- excessive bleeding"  . CESAREAN SECTION    . CHOLECYSTECTOMY    . LOOP RECORDER IMPLANT     Dr. Johney FrameAllred placed 10/15/2014  . TEE WITHOUT CARDIOVERSION N/A 10/15/2014   Procedure: TRANSESOPHAGEAL ECHOCARDIOGRAM  (TEE);  Surgeon: Pricilla RifflePaula Ross V, MD;  [facility];  Service: Cardiovascular;  Laterality: N/A;    Prior to Admission medications   Medication Sig Start Date End Date Taking? Authorizing Provider  acetaminophen (TYLENOL) 500 MG tablet Take 1,000 mg by mouth 2 (two) times daily as needed for moderate pain.    Yes [provider]  Amino Acids-Protein Hydrolys (FEEDING SUPPLEMENT, PRO-STAT SUGAR FREE 64,) LIQD Take 30 mLs by mouth daily.   Yes [provider]  aspirin EC 81 MG tablet Take 81 mg by mouth daily.   Yes [provider]  atorvastatin (LIPITOR) 20 MG tablet Take 1 tablet (20 mg total) by mouth daily at 6 PM. 10/18/14  Yes Sharl MaLama, Sarina IllGagan S, MD  baclofen (LIORESAL) 10 MG tablet Take 1 tablet (10 mg total) by mouth 3 (three) times daily. Start one tablet twice daily x 2 weeks then three times daily Patient taking differently: Take 10 mg by mouth 2 (two) times daily as needed.  01/12/15  Yes Micki RileySethi, Pramod S, MD  clopidogrel (PLAVIX) 75 MG tablet Take 1 tablet (75 mg total) by mouth daily. 10/18/14  Yes Meredeth IdeLama, Gagan S, MD  esomeprazole (NEXIUM) 40 MG packet Take 40 mg by mouth daily before breakfast.   Yes [provider]  furosemide (LASIX) 20 MG tablet Take 60 mg by mouth daily.    Yes [provider]  gabapentin (NEURONTIN) 300 MG capsule Take 1 capsule (300 mg total) by mouth at bedtime. Patient taking differently: Take 300 mg by mouth 2 (two) times daily.  10/18/14  Yes Mauro KaufmannLama, Gagan  S, MD  insulin glargine (LANTUS) 100 UNIT/ML injection Inject 0.65 mLs (65 Units total) into the skin at bedtime. Patient taking differently: Inject 70 Units into the skin at bedtime.  11/22/14  Yes Kathlen Mody, MD  insulin NPH Human (HUMULIN N,NOVOLIN N) 100 UNIT/ML injection Inject 12 Units into the skin daily.   Yes [provider]  insulin regular (NOVOLIN R,HUMULIN R) 100 units/mL injection Inject 15 Units into the skin daily. 12 units before breakfast,  15 units before lunch and dinner   Yes [provider]  levothyroxine (SYNTHROID, LEVOTHROID) 112 MCG tablet Take 112 mcg by mouth daily.    Yes [provider]  LORazepam (ATIVAN) 0.5 MG tablet Take one tablet by mouth twice daily for anxiety 09/27/15  Yes Sharon Seller, NP  losartan (COZAAR) 100 MG tablet Take 100 mg by mouth daily.     Yes [provider]  metoprolol tartrate (LOPRESSOR) 50 MG tablet Take 25-50 mg by mouth 2 (two) times daily. Patient takes 50mg  in the morning and 25mg  in the evening   Yes [provider]  Multiple Vitamins-Minerals (MULTIVITAMIN WITH MINERALS) tablet Take 1 tablet by mouth daily.   Yes [provider]  nitroGLYCERIN (NITROSTAT) 0.4 MG SL tablet Place 0.4 mg under the tongue every 5 (five) minutes as needed for chest pain.   Yes [provider]  oxyCODONE (OXY IR/ROXICODONE) 5 MG immediate release tablet Take 5 mg by mouth 2 (two) times daily.   Yes [provider]  potassium chloride SA (K-DUR,KLOR-CON) 20 MEQ tablet Take 20 mEq by mouth daily.     Yes [provider]  sennosides-docusate sodium (SENOKOT-S) 8.6-50 MG tablet Take 1 tablet by mouth daily.   Yes [provider]  sertraline (ZOLOFT) 100 MG tablet Take 100 mg by mouth daily.    Yes [provider]    Current Facility-Administered Medications  Medication Dose Route Frequency Provider Last Rate Last Dose  . 0.45 % NaCl with KCl 20 mEq / L infusion   Intravenous Continuous Johnson, Clanford L, MD 125 mL/hr at 11/04/17 0630    . acetaminophen (TYLENOL) tablet 650 mg  650 mg Oral Q6H PRN Leroy Sea, MD       Or  . acetaminophen (TYLENOL) suppository 650 mg  650 mg Rectal Q6H PRN Leroy Sea, MD      . atorvastatin (LIPITOR) tablet 20 mg  20 mg Oral q1800 Leroy Sea, MD   20 mg at 11/03/17 1735  . baclofen (LIORESAL) tablet 5 mg  5 mg Oral BID Leroy Sea, MD   5 mg at 11/04/17 0830  .  Chlorhexidine Gluconate Cloth 2 % PADS 6 each  6 each Topical Q0600 Leroy Sea, MD   6 each at 11/04/17 [phone]  . clopidogrel (PLAVIX) tablet 75 mg  75 mg Oral Daily Leroy Sea, MD   75 mg at 11/04/17 0830  . [START ON 11/11/2017] cyanocobalamin ((VITAMIN B-12)) injection 1,000 mcg  1,000 mcg Intramuscular Q30 days Leroy Sea, MD      . diphenoxylate-atropine (LOMOTIL) 2.5-0.025 MG per tablet 2 tablet  2 tablet Oral QID Laural Benes, Clanford L, MD   2 tablet at 11/04/17 0829  . heparin injection 5,000 Units  5,000 Units Subcutaneous Q8H Leroy Sea, MD   5,000 Units at 11/04/17 [phone]  . insulin aspart (novoLOG) injection 0-5 Units  0-5 Units Subcutaneous QHS Leroy Sea, MD   2 Units at 11/02/17  2200  . insulin aspart (novoLOG) injection 0-9 Units  0-9 Units Subcutaneous TID WC Leroy Sea, MD   1 Units at 11/03/17 1649  . insulin glargine (LANTUS) injection 65 Units  65 Units Subcutaneous QHS Erick Blinks, MD   65 Units at 11/03/17 2255  . levothyroxine (SYNTHROID, LEVOTHROID) tablet 125 mcg  125 mcg Oral QAC breakfast Leroy Sea, MD   125 mcg at 11/04/17 0829  . metoprolol succinate (TOPROL-XL) 24 hr tablet 50 mg  50 mg Oral Daily Leroy Sea, MD   50 mg at 11/04/17 0829  . mupirocin ointment (BACTROBAN) 2 % 1 application  1 application Nasal BID Leroy Sea, MD   1 application at 11/04/17 [phone]  . pantoprazole (PROTONIX) EC tablet 40 mg  40 mg Oral Daily Leroy Sea, MD   40 mg at 11/04/17 1191    Allergies as of 10/31/2017 - Review Complete 10/31/2017  Allergen Reaction Noted  . Ivp dye [iodinated diagnostic agents]  08/07/2011  . Penicillins Swelling and Rash 08/07/2011    Family History  Problem Relation Age of Onset  . Heart disease Father   . Hypertension Sister   . Colon cancer Neg Hx   . Colon polyps Neg Hx     Social History   Socioeconomic History  . Marital status: Married    Spouse name: Not on file  . Number of  children: 2  . Years of education: 43  . Highest education level: Not on file  Social Needs  . Financial resource strain: Not on file  . Food insecurity - worry: Not on file  . Food insecurity - inability: Not on file  . Transportation needs - medical: Not on file  . Transportation needs - non-medical: Not on file  Occupational History  . Occupation: retired    Comment: Agricultural engineer   Tobacco Use  . Smoking status: Never Smoker  . Smokeless tobacco: Never Used  Substance and Sexual Activity  . Alcohol use: No  . Drug use: No  . Sexual activity: Not Currently  Other Topics Concern  . Not on file  Social History Narrative   Married, 2 children   Right handed   caffeinne use- occasionally    Review of Systems: Gen: Denies fever, chills, loss of appetite, change in weight or weight loss CV: Denies chest pain, heart palpitations, syncope, edema  Resp: Denies shortness of breath with rest, cough, wheezing GI: see HPI  GU : Denies urinary burning, urinary frequency, urinary incontinence.  MS: left-side weakness. Wheelchair bound  Derm: Denies rash, itching, dry skin Psych: +depression  Heme: Denies bruising, bleeding, and enlarged lymph nodes.  Physical Exam: Vital signs in last 24 hours: Temp:  [98.5 F (36.9 C)-99.1 F (37.3 C)] 98.5 F (36.9 C) (03/18 0510) Pulse Rate:  [77-87] 77 (03/18 0510) Resp:  [16] 16 (03/18 0510) BP: (168-177)/(90-94) 168/90 (03/18 0510) SpO2:  [94 %-98 %] 94 % (03/18 0510) Last BM Date: 11/03/17 General:   Alert,  well-nourished, pleasant and cooperative in NAD Head:  Normocephalic and atraumatic. Eyes:  Sclera clear, no icterus.    Ears:  Normal auditory acuity. Nose:  No deformity, discharge,  or lesions. Lungs:  Clear throughout to auscultation.   No wheezes, crackles, or rhonchi. No acute distress. Heart:  S1 S2 present without obvious murmur Abdomen:  Soft, mild TTP LLQ, obese but soft. No masses, hepatosplenomegaly or hernias  noted. Normal bowel sounds, without guarding, and without rebound.  Rectal:  Deferred  Msk:  Residual left-sided upper extremity weakness/left lower extremity from prior stroke Extremities:  Without  edema. Neurologic:  Alert and  oriented x4 Psych:  Alert and cooperative. Normal mood and affect.  Intake/Output from previous day: 03/17 0701 - 03/18 0700 In: 3382.5 [P.O.:720; I.V.:1962.5; IV Piggyback:700] Out: -  Intake/Output this shift: No intake/output data recorded.  Lab Results: Recent Labs    11/03/17 0559  WBC 12.3*  HGB 12.6  HCT 39.5  PLT 281   BMET Recent Labs    11/02/17 0605 11/03/17 0559 11/04/17 0531  NA 147* 151* 144  K 2.8* 2.4* 2.7*  CL 119* 121* 117*  CO2 17* 18* 17*  GLUCOSE 193* 163* 96  BUN 14 12 11   CREATININE 0.87 0.93 0.95  CALCIUM 9.0 9.0 8.7*   C-Diff Recent Labs    11/02/17 1834  CDIFFTOX NEGATIVE     Impression: 67 year old female presenting from [facility] with altered mental status, vomiting, and loose stool, found to have acute renal injury and possible UTI. Initially, she improved from a GI standpoint with supportive measures; however, Cdiff was checked due to recurrent diarrhea while admitted, which was negative. No GI pathogen panel. Lomitil started on 3/17. Clinically, patient has improved since addition of Lomotil, noting 5 stools yesterday but reported as soft. N/V resolved. No known stools overnight or thus far this morning. N/V/D likely multifactorial in setting of UTI, likely gastroenteritis, encouraging that Cdiff was negative as she does have several risk factors for this. As she is clinically improving, recommend supportive measures. If recurrent diarrhea, would collect stool culture. At some point, needs routine screening colonoscopy as outpatient once acute illness resolved.    Plan: Monitor for any persistent or recurrent diarrhea Collect GI pathogen panel, repeat Cdiff if persistent loose stools Continue  supportive measures: started on Lomotil yesterday. Monitor for constipation Outpatient routine screening colonoscopy once acute illness resolved  Will continue to follow with you   Gelene Mink, PhD, ANP-BC [facility]     LOS: 0 days    11/04/2017, 9:56 AM

## 2017-11-05 ENCOUNTER — Inpatient Hospital Stay (HOSPITAL_COMMUNITY): Payer: Medicare Other

## 2017-11-05 DIAGNOSIS — D72829 Elevated white blood cell count, unspecified: Secondary | ICD-10-CM | POA: Diagnosis present

## 2017-11-05 DIAGNOSIS — R140 Abdominal distension (gaseous): Secondary | ICD-10-CM

## 2017-11-05 LAB — MAGNESIUM: Magnesium: 2 mg/dL (ref 1.7–2.4)

## 2017-11-05 LAB — BASIC METABOLIC PANEL
ANION GAP: 11 (ref 5–15)
BUN: 13 mg/dL (ref 6–20)
CALCIUM: 8.5 mg/dL — AB (ref 8.9–10.3)
CO2: 15 mmol/L — AB (ref 22–32)
Chloride: 117 mmol/L — ABNORMAL HIGH (ref 101–111)
Creatinine, Ser: 0.98 mg/dL (ref 0.44–1.00)
GFR, EST NON AFRICAN AMERICAN: 58 mL/min — AB (ref >60)
Glucose, Bld: 119 mg/dL — ABNORMAL HIGH (ref 65–99)
Potassium: 3.1 mmol/L — ABNORMAL LOW (ref 3.5–5.1)
Sodium: 143 mmol/L (ref 135–145)

## 2017-11-05 LAB — URINALYSIS, ROUTINE W REFLEX MICROSCOPIC
Bilirubin Urine: NEGATIVE
Glucose, UA: NEGATIVE mg/dL
KETONES UR: NEGATIVE mg/dL
NITRITE: NEGATIVE
PROTEIN: NEGATIVE mg/dL
Specific Gravity, Urine: 1.02 (ref 1.005–1.030)
pH: 6 (ref 5.0–8.0)

## 2017-11-05 LAB — CBC WITH DIFFERENTIAL/PLATELET
BASOS PCT: 0 %
Basophils Absolute: 0.1 10*3/uL (ref 0.0–0.1)
EOS ABS: 0.6 10*3/uL (ref 0.0–0.7)
EOS PCT: 3 %
HCT: 41.5 % (ref 36.0–46.0)
Hemoglobin: 13.3 g/dL (ref 12.0–15.0)
Lymphocytes Relative: 13 %
Lymphs Abs: 2.4 10*3/uL (ref 0.7–4.0)
MCH: 28.5 pg (ref 26.0–34.0)
MCHC: 32 g/dL (ref 30.0–36.0)
MCV: 88.9 fL (ref 78.0–100.0)
MONO ABS: 1.6 10*3/uL (ref 0.1–1.0)
MONOS PCT: 9 %
Neutro Abs: 14 10*3/uL (ref 1.7–7.7)
Neutrophils Relative %: 75 %
Platelets: 307 10*3/uL (ref 150–400)
RBC: 4.67 MIL/uL (ref 3.87–5.11)
RDW: 15.4 % (ref 11.5–15.5)
WBC: 18.7 10*3/uL — ABNORMAL HIGH (ref 4.0–10.5)

## 2017-11-05 LAB — GLUCOSE, CAPILLARY
GLUCOSE-CAPILLARY: 110 mg/dL — AB (ref 65–99)
GLUCOSE-CAPILLARY: 144 mg/dL — AB (ref 65–99)
Glucose-Capillary: 127 mg/dL — ABNORMAL HIGH (ref 65–99)
Glucose-Capillary: 161 mg/dL — ABNORMAL HIGH (ref 65–99)

## 2017-11-05 LAB — LACTIC ACID, PLASMA: LACTIC ACID, VENOUS: 0.9 mmol/L (ref 0.5–1.9)

## 2017-11-05 MED ORDER — POTASSIUM CHLORIDE CRYS ER 20 MEQ PO TBCR
60.0000 meq | EXTENDED_RELEASE_TABLET | Freq: Once | ORAL | Status: AC
  Administered 2017-11-05: 60 meq via ORAL
  Filled 2017-11-05: qty 3

## 2017-11-05 MED ORDER — ALUM & MAG HYDROXIDE-SIMETH 200-200-20 MG/5ML PO SUSP
30.0000 mL | Freq: Four times a day (QID) | ORAL | Status: DC | PRN
  Administered 2017-11-05: 30 mL via ORAL
  Filled 2017-11-05: qty 30

## 2017-11-05 MED ORDER — ONDANSETRON HCL 4 MG/2ML IJ SOLN
4.0000 mg | Freq: Four times a day (QID) | INTRAMUSCULAR | Status: DC | PRN
  Administered 2017-11-05 – 2017-11-11 (×4): 4 mg via INTRAVENOUS
  Filled 2017-11-05 (×4): qty 2

## 2017-11-05 MED ORDER — LORAZEPAM 2 MG/ML IJ SOLN
0.5000 mg | Freq: Once | INTRAMUSCULAR | Status: AC
  Administered 2017-11-05: 0.5 mg via INTRAVENOUS
  Filled 2017-11-05: qty 1

## 2017-11-05 NOTE — Progress Notes (Signed)
Spoke with Lewie LoronAnna Boone, NP. Today who stated that if patient had another episode of diarrhea to test for C- Diff and GI panel, in case a false negative result was present. Verbal order put into system under GI doctor's name. Will notify Hospitalist as well.

## 2017-11-05 NOTE — Progress Notes (Signed)
Pt transferred from bed to chair. Tolerated fair. Will continue to monitor.  

## 2017-11-05 NOTE — Progress Notes (Signed)
CT abd/pelvis without contrast that was ordered today was reviewed. Possible high-grade partial SBO or early SBO with transition point RLQ likely related to adhesive disease. Spoke with Revonda StandardAllison, RN, who is with patient tonight. No N/V. No obstructive signs currently. Lomotil discontinued earlier today. Would not place NGT unless need for decompression or vomiting/nausea. Will back down diet to sips of clears. Zofran prn. Monitor for clinical changes. To be reassessed in the morning. Abdominal pain was improved this afternoon. Continue to follow closely. Spoke with nursing regarding signs/symptoms to monitor.

## 2017-11-05 NOTE — Progress Notes (Signed)
    Subjective: Nausea this morning. No vomiting. Abdominal pain upper abdomen. States pain is 9/10 but does not appear in distress. +flatus yesterday evening but none today. Feels like abdomen is tight. Spoke with nursing, who states stool is soft. None this shift, but she had 3 in the past 24 hours, improved from day prior.   Objective: Vital signs in last 24 hours: Temp:  [97.4 F (36.3 C)-98.9 F (37.2 C)] 97.4 F (36.3 C) (03/19 0505) Pulse Rate:  [76-100] 100 (03/19 0505) Resp:  [20] 20 (03/19 0505) BP: (151-164)/(78-84) 164/81 (03/19 0505) SpO2:  [94 %-96 %] 96 % (03/19 0505) Last BM Date: 11/05/17 General:   Alert and oriented, pleasant but appears uncomfortable. No distress despite reports of pain 9/10 Abdomen:  Obese, more distended, tight upper abdomen, no rebound or guarding. Mild TTP upper abdomen. Bowel sounds hypoactive and tympanic Neurologic:  Alert and  oriented x4  Intake/Output from previous day: 03/18 0701 - 03/19 0700 In: 2486.3 [P.O.:600; I.V.:1886.3] Out: 450 [Urine:450] Intake/Output this shift: No intake/output data recorded.  Lab Results: Recent Labs    11/03/17 0559  WBC 12.3*  HGB 12.6  HCT 39.5  PLT 281   BMET Recent Labs    11/03/17 0559 11/04/17 0531 11/05/17 0415  NA 151* 144 143  K 2.4* 2.7* 3.1*  CL 121* 117* 117*  CO2 18* 17* 15*  GLUCOSE 163* 96 119*  BUN 12 11 13   CREATININE 0.93 0.95 0.98  CALCIUM 9.0 8.7* 8.5*      Assessment: 67 year old [facility] with altered mental status, vomiting, and loose stool, found to have acute renal injury and possible UTI. Initially, she improved from a GI standpoint with supportive measures; however, Cdiff was checked due to recurrent diarrhea while admitted, which was negative. No GI pathogen panel. Lomitil started on 3/17. Clinically, patient had improved with addition of Lomotil, with decreased frequency and improved stool consistency.   Today, she notes 9/10  pain but clinically does not appear in distress. Abdomen appears more distended, hypoactive and tympanitic bowel sounds. Nausea noted but without vomiting. Ordering KUB now. No imaging this admission. Check CBC as well.   If she were to have recurrent diarrhea, would highly recommend repeat Cdiff testing despite initial negative test, as false negatives can occur and she has multiple risk factors for CDI incidence. Encouragingly, stool consistency is improved. Will stop Lomotil for now.   At some point, needs outpatient routine screening colonoscopy.   Plan: CBC, KUB now Discontinue lomotil for now If recurrent diarrhea, check Cdiff and GI pathogen panel Outpatient screening colonoscopy when acute illness resolved   Gelene MinkAnna W. Alva Kuenzel, PhD, ANP-BC [facility]    LOS: 1 day    11/05/2017, 8:02 AM

## 2017-11-05 NOTE — Progress Notes (Addendum)
PROGRESS NOTE    Dawn Ho  OVF:[id number] DOB: 06-22-1951 DOA: 10/31/2017 PCP: Bernerd Limbo, MD    Brief Narrative:  67 year old female with a history of right MCA stroke, hypertension, diabetes, diastolic heart failure, who is a resident of nursing facility, presented to the hospital with altered mental status.  She was having vomiting and some loose stools.  Urinalysis indicated possible UTI.  She was noted to be dehydrated in any acute kidney injury on admission.  She was admitted for IV fluids and IV antibiotics.  Urine culture process.  Overall mental status has improved.  Plan is to return to skilled nursing facility on discharge.  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 Diarrhea. Resolved.  Patient reports that stools have slowed down on lomotil which was discontinued this morning.  stool was tested negative for C. Difficile. GI recommend getting stool panel if further diarrhea occurs.   2. Acute Abdominal distention / Abd pain - I spoke with GI team, abd xray suspicious for distal sBO, CT scan ordered and pending.  Follow results.  She has had 3 soft BMs today so far.   3. Leukocytosis - will check CT abd, checking 2 view CXR.  Check urine for cure.   4. Urinary tract infection.  Treated with 3 doses of Rocephin.  Urine culture positive for E. Coli. Follow up C/S.   5. Metabolic encephalopathy due to dehydration.  Improved with IV fluids.  Mental status appears to be approaching baseline.  Continue to monitor. 6. Hypernatremia - changed IVFs to 0.45 NS K, recheck in AM.  7. Acute kidney injury.  Related to #1 as well as diuretic and ACE inhibitor use.  These are currently on hold.  Creatinine is improved.  Recheck in a.m. 8. History of stroke with left-sided  hemiparesis.  Supportive care.  Continue Plavix and statin. 9. Chronic diastolic congestive heart failure.  Appears compensated at this time.  Continue current management. 10. Diabetes.  Blood sugars have been stable.  Continue on Lantus and sliding scale insulin. 11. Hypokalemia.  Improving.  Replaced orally and in IVFs.  Magnesium replacement ordered as well.  12. Metabolic acidosis.  Likely related to diarrhea and GI losses.  Continue IVF hydration.   DVT prophylaxis: Heparin Code Status: Full code Family Communication: Discussed with son over the phone on 3/15 Disposition Plan: pending further work up for acute abdominal pain and distention  Antimicrobials:   Rocephin 3/14 >3/17  Subjective: Pt having some abdominal bloating and pain this morning.  No diarrhea.   Objective: Vitals:   11/04/17 1321 11/04/17 2127 11/05/17 0505 11/05/17 1333  BP: (!) 163/84 (!) 151/78 (!) 164/81 (!) 175/100  Pulse: 76 92 100 (!) 112  Resp: 20 20 20 20   Temp: 98.9 F (37.2 C) 98.9 F (37.2 C) (!) 97.4 F (36.3 C) (!) 97.5 F (36.4 C)  TempSrc: Axillary Oral Oral Oral  SpO2: 96% 94% 96% 95%  Weight:      Height:        Intake/Output Summary (Last 24 hours) at 11/05/2017 1352 Last data filed at 11/05/2017 0900 Gross per 24 hour  Intake 2606.25 ml  Output 450 ml  Net 2156.25 ml   Filed Weights   10/31/17 1604 11/01/17 0521  Weight:  94.8 kg (208 lb 15.9 oz) 94.8 kg (208 lb 15.9 oz)    Examination:  General exam: Alert, awake, oriented x 3 Respiratory system: Clear to auscultation. Respiratory effort normal. Cardiovascular system: normal s1, s2 sounds.  No murmurs, rubs, gallops. Gastrointestinal system: Abdomen is mildly distended, soft and tender in left lower quadrant. No organomegaly or masses felt. Normal bowel sounds heard. No guarding.  Central nervous system: Alert and oriented. No focal neurological deficits. Extremities: No C/C/E, +pedal pulses Skin: No rashes, lesions or  ulcers Psychiatry: Judgement and insight appear normal. Mood & affect appropriate.   Data Reviewed: I have personally reviewed following labs and imaging studies  CBC: Recent Labs  Lab 10/31/17 0932 11/01/17 0504 11/03/17 0559 11/05/17 0415  WBC 13.3* 10.4 12.3* 18.7*  NEUTROABS 10.2*  --   --  14.0  HGB 12.9 11.8* 12.6 13.3  HCT 40.8 37.9 39.5 41.5  MCV 89.5 90.0 88.2 88.9  PLT 264 246 281 307   Basic Metabolic Panel: Recent Labs  Lab 11/01/17 0504 11/02/17 0605 11/02/17 1652 11/03/17 0559 11/04/17 0531 11/05/17 0415  NA 145 147*  --  151* 144 143  K 3.2* 2.8*  --  2.4* 2.7* 3.1*  CL 118* 119*  --  121* 117* 117*  CO2 17* 17*  --  18* 17* 15*  GLUCOSE 255* 193*  --  163* 96 119*  BUN 24* 14  --  12 11 13   CREATININE 1.11* 0.87  --  0.93 0.95 0.98  CALCIUM 8.5* 9.0  --  9.0 8.7* 8.5*  MG 2.0  --  1.9  --  1.7 2.0   GFR: Estimated Creatinine Clearance: 59.8 mL/min (by C-G formula based on SCr of 0.98 mg/dL). Liver Function Tests: Recent Labs  Lab 10/31/17 0932  AST 15  ALT 13*  ALKPHOS 70  BILITOT 1.0  PROT 7.3  ALBUMIN 3.3*   No results for input(s): LIPASE, AMYLASE in the last 168 hours. No results for input(s): AMMONIA in the last 168 hours. Coagulation Profile: No results for input(s): INR, PROTIME in the last 168 hours. Cardiac Enzymes: Recent Labs  Lab 10/31/17 0932  TROPONINI <0.03   BNP (last 3 results) No results for input(s): PROBNP in the last 8760 hours. HbA1C: No results for input(s): HGBA1C in the last 72 hours. CBG: Recent Labs  Lab 11/04/17 1134 11/04/17 1615 11/04/17 2132 11/05/17 0733 11/05/17 1113  GLUCAP 149* 93 119* 110* 127*   Lipid Profile: No results for input(s): CHOL, HDL, LDLCALC, TRIG, CHOLHDL, LDLDIRECT in the last 72 hours. Thyroid Function Tests: No results for input(s): TSH, T4TOTAL, FREET4, T3FREE, THYROIDAB in the last 72 hours. Anemia Panel: No results for input(s): VITAMINB12, FOLATE, FERRITIN, TIBC,  IRON, RETICCTPCT in the last 72 hours. Sepsis Labs: Recent Labs  Lab 11/05/17 1204  LATICACIDVEN 0.9    Recent Results (from the past 240 hour(s))  Urine Culture     Status: Abnormal   Collection Time: 10/31/17 10:14 AM  Result Value Ref Range Status   Specimen Description   Final    URINE, RANDOM Performed at [facility], [address], [place], [place] [address]    Special Requests   Final    NONE Performed at [facility], [address] [place], [place] [address]    Culture >=100,000 COLONIES/mL ESCHERICHIA COLI (A)  Final   Report Status 11/03/2017 FINAL  Final   Organism ID, Bacteria ESCHERICHIA COLI (A)  Final      Susceptibility  Escherichia coli - MIC*    AMPICILLIN >=32 RESISTANT Resistant     CEFAZOLIN <=4 SENSITIVE Sensitive     CEFTRIAXONE <=1 SENSITIVE Sensitive     CIPROFLOXACIN <=0.25 SENSITIVE Sensitive     GENTAMICIN >=16 RESISTANT Resistant     IMIPENEM <=0.25 SENSITIVE Sensitive     NITROFURANTOIN <=16 SENSITIVE Sensitive     TRIMETH/SULFA <=20 SENSITIVE Sensitive     AMPICILLIN/SULBACTAM 16 INTERMEDIATE Intermediate     PIP/TAZO <=4 SENSITIVE Sensitive     Extended ESBL NEGATIVE Sensitive     * >=100,000 COLONIES/mL ESCHERICHIA COLI  MRSA PCR Screening     Status: Abnormal   Collection Time: 10/31/17  4:22 PM  Result Value Ref Range Status   MRSA by PCR POSITIVE (A) NEGATIVE Final    Comment:        The GeneXpert MRSA Assay (FDA approved for NASAL specimens only), is one component of a comprehensive MRSA colonization surveillance program. It is not intended to diagnose MRSA infection nor to guide or monitor treatment for MRSA infections. RESULT CALLED TO, READ BACK BY AND VERIFIED WITH: JACKSON,N AT 614PM ON [id number] BY ISLEY,B Performed at [facility], [address], [place], [place] [address]   C difficile quick scan w PCR reflex     Status: None   Collection Time: 11/02/17  6:34 PM  Result Value Ref Range Status   C Diff antigen  NEGATIVE NEGATIVE Final   C Diff toxin NEGATIVE NEGATIVE Final   C Diff interpretation No C. difficile detected.  Final    Comment: Performed at [facility], [address] [place], [place] [address]    Radiology Studies: Dg Abd 1 View  Result Date: 11/05/2017 CLINICAL DATA:  Abdominal distension EXAM: ABDOMEN - 1 VIEW COMPARISON:  CT abdomen 08/30/2016 FINDINGS: There is significant diffuse small bowel dilatation. There is gaseous distention of the colon. There is no evidence of pneumoperitoneum, portal venous gas or pneumatosis. There are no pathologic calcifications along the expected course of the ureters. The osseous structures are unremarkable. IMPRESSION: Gaseous distension of the small bowel and to lesser extent the colon, as can be seen with an ileus versus distal bowel obstruction. Electronically Signed   By: Elige Ko   On: 11/05/2017 10:09    Scheduled Meds: . atorvastatin  20 mg Oral q1800  . baclofen  5 mg Oral BID  . clopidogrel  75 mg Oral Daily  . [START ON 11/11/2017] cyanocobalamin  1,000 mcg Intramuscular Q30 days  . heparin  5,000 Units Subcutaneous Q8H  . insulin aspart  0-5 Units Subcutaneous QHS  . insulin aspart  0-9 Units Subcutaneous TID WC  . insulin glargine  65 Units Subcutaneous QHS  . levothyroxine  125 mcg Oral QAC breakfast  . metoprolol succinate  50 mg Oral Daily  . pantoprazole  40 mg Oral Daily   Continuous Infusions: . 0.45 % NaCl with KCl 20 mEq / L 75 mL/hr at 11/05/17 1250     LOS: 1 day    Time spent:  Standley Dakins, MD [facility] Pager [phone]  If 7PM-7AM, please contact night-coverage [email or web address] Password [email or web address] 11/05/2017, 1:52 PM

## 2017-11-05 NOTE — Progress Notes (Signed)
Dr. Robb Matarrtiz paged and made aware of pts Potassium level of 3.1 with morning labs. Waiting for orders/call back

## 2017-11-05 NOTE — Progress Notes (Signed)
Pt started drinking contrast for CT scan at 1338.

## 2017-11-05 NOTE — Progress Notes (Signed)
Pt very agitated and yelling in room stating "where is Sean." Pt being very combative at trying to hit nurse and NT. Pt keeps hollering at staff. Md on call paged to see if RN can get something for anxiety. Waiting for call back/orders.

## 2017-11-05 NOTE — Care Management Important Message (Signed)
Important Message  Patient Details  Name: Dawn Ho MRN: [id number] Date of Birth: November 19, 1950   Medicare Important Message Given:  Yes    Wendolyn Raso, Chrystine OilerSharley Diane, RN 11/05/2017, 12:58 PM

## 2017-11-05 NOTE — Progress Notes (Addendum)
This RN spoke to Baird KayGI-Anna Boone, NP earlier tonight about results of pt CT scan that showed partial SBO. Pt is complaining of some nausea, no vomiting tonight has occurred so far. Zofran x1 has been given this shift. Pt states she is having no abdominal pain even with palpation. Pt continues to be very confused and combative at times refusing to take her medication with being prompted several times. Ativan 0.5mg  IV x1 dose given per MD order d/t agitation/anxiety. Will continue to monitor pt. If GI symptoms worsen or pt starts to have emesis, MD on call will be paged for possible NGT placement if pt allows.   This RN also made pt Baird KayGI-Anna Boone, NP aware of pts confusion and combativeness.

## 2017-11-05 NOTE — Progress Notes (Signed)
KUB reviewed. Significant diffuse small bowel dilation. Gaseous distension of colon. No evidence for pneumoperitoneum, portal venous gas, or pneumatosis.   I would favor an ileus over bowel obstruction in light of clinical assessment today. CBC reviewed, WBC count 18.7. Due to her reports of abdominal pain, proceeding with CT.   She has an allergy to IVP dye. Would need to be pre-medicated prior to IV contrast if this was more of an elective setting. Will proceed with CT abd/pelvis without IV contrast now.

## 2017-11-06 ENCOUNTER — Inpatient Hospital Stay (HOSPITAL_COMMUNITY): Payer: Medicare Other

## 2017-11-06 ENCOUNTER — Encounter (HOSPITAL_COMMUNITY): Payer: Self-pay | Admitting: Family Medicine

## 2017-11-06 DIAGNOSIS — D72829 Elevated white blood cell count, unspecified: Secondary | ICD-10-CM

## 2017-11-06 DIAGNOSIS — K567 Ileus, unspecified: Secondary | ICD-10-CM

## 2017-11-06 DIAGNOSIS — T17908D Unspecified foreign body in respiratory tract, part unspecified causing other injury, subsequent encounter: Secondary | ICD-10-CM

## 2017-11-06 DIAGNOSIS — R0989 Other specified symptoms and signs involving the circulatory and respiratory systems: Secondary | ICD-10-CM | POA: Diagnosis not present

## 2017-11-06 DIAGNOSIS — K56609 Unspecified intestinal obstruction, unspecified as to partial versus complete obstruction: Secondary | ICD-10-CM

## 2017-11-06 DIAGNOSIS — K56600 Partial intestinal obstruction, unspecified as to cause: Secondary | ICD-10-CM | POA: Diagnosis not present

## 2017-11-06 LAB — CBC WITH DIFFERENTIAL/PLATELET
BASOS ABS: 0 10*3/uL (ref 0.0–0.1)
Basophils Relative: 0 %
EOS PCT: 2 %
Eosinophils Absolute: 0.4 10*3/uL (ref 0.0–0.7)
HCT: 43.8 % (ref 36.0–46.0)
Hemoglobin: 13.8 g/dL (ref 12.0–15.0)
LYMPHS ABS: 2 10*3/uL (ref 0.7–4.0)
LYMPHS PCT: 9 %
MCH: 28.3 pg (ref 26.0–34.0)
MCHC: 31.5 g/dL (ref 30.0–36.0)
MCV: 89.8 fL (ref 78.0–100.0)
MONO ABS: 1.6 10*3/uL — AB (ref 0.1–1.0)
MONOS PCT: 8 %
Neutro Abs: 17 10*3/uL — ABNORMAL HIGH (ref 1.7–7.7)
Neutrophils Relative %: 81 %
PLATELETS: 298 10*3/uL (ref 150–400)
RBC: 4.88 MIL/uL (ref 3.87–5.11)
RDW: 15.8 % — AB (ref 11.5–15.5)
WBC: 21 10*3/uL — ABNORMAL HIGH (ref 4.0–10.5)

## 2017-11-06 LAB — COMPREHENSIVE METABOLIC PANEL
ALT: 25 U/L (ref 14–54)
ANION GAP: 13 (ref 5–15)
AST: 35 U/L (ref 15–41)
Albumin: 2.9 g/dL — ABNORMAL LOW (ref 3.5–5.0)
Alkaline Phosphatase: 65 U/L (ref 38–126)
BUN: 18 mg/dL (ref 6–20)
CHLORIDE: 117 mmol/L — AB (ref 101–111)
CO2: 12 mmol/L — ABNORMAL LOW (ref 22–32)
Calcium: 8.5 mg/dL — ABNORMAL LOW (ref 8.9–10.3)
Creatinine, Ser: 1 mg/dL (ref 0.44–1.00)
GFR, EST NON AFRICAN AMERICAN: 57 mL/min — AB (ref >60)
Glucose, Bld: 133 mg/dL — ABNORMAL HIGH (ref 65–99)
POTASSIUM: 3.3 mmol/L — AB (ref 3.5–5.1)
Sodium: 142 mmol/L (ref 135–145)
Total Bilirubin: 1 mg/dL (ref 0.3–1.2)
Total Protein: 6 g/dL — ABNORMAL LOW (ref 6.5–8.1)

## 2017-11-06 LAB — GLUCOSE, CAPILLARY
GLUCOSE-CAPILLARY: 152 mg/dL — AB (ref 65–99)
Glucose-Capillary: 152 mg/dL — ABNORMAL HIGH (ref 65–99)
Glucose-Capillary: 149 mg/dL — ABNORMAL HIGH (ref 65–99)
Glucose-Capillary: 145 mg/dL — ABNORMAL HIGH (ref 65–99)

## 2017-11-06 LAB — GASTROINTESTINAL PANEL BY PCR, STOOL (REPLACES STOOL CULTURE)
ADENOVIRUS F40/41: NOT DETECTED
Astrovirus: NOT DETECTED
CRYPTOSPORIDIUM: NOT DETECTED
CYCLOSPORA CAYETANENSIS: NOT DETECTED
Campylobacter species: NOT DETECTED
ENTEROAGGREGATIVE E COLI (EAEC): NOT DETECTED
ENTEROPATHOGENIC E COLI (EPEC): NOT DETECTED
Entamoeba histolytica: NOT DETECTED
Enterotoxigenic E coli (ETEC): NOT DETECTED
GIARDIA LAMBLIA: NOT DETECTED
Norovirus GI/GII: DETECTED — AB
Plesimonas shigelloides: NOT DETECTED
ROTAVIRUS A: NOT DETECTED
Salmonella species: NOT DETECTED
Sapovirus (I, II, IV, and V): NOT DETECTED
Shiga like toxin producing E coli (STEC): NOT DETECTED
Shigella/Enteroinvasive E coli (EIEC): NOT DETECTED
VIBRIO SPECIES: NOT DETECTED
Vibrio cholerae: NOT DETECTED
YERSINIA ENTEROCOLITICA: NOT DETECTED

## 2017-11-06 LAB — MAGNESIUM: Magnesium: 2.1 mg/dL (ref 1.7–2.4)

## 2017-11-06 LAB — PHOSPHORUS: Phosphorus: 3 mg/dL (ref 2.5–4.6)

## 2017-11-06 LAB — C DIFFICILE QUICK SCREEN W PCR REFLEX
C DIFFICILE (CDIFF) INTERP: NOT DETECTED
C Diff antigen: NEGATIVE
C Diff toxin: NEGATIVE

## 2017-11-06 LAB — LACTIC ACID, PLASMA: Lactic Acid, Venous: 1 mmol/L (ref 0.5–1.9)

## 2017-11-06 MED ORDER — PANTOPRAZOLE SODIUM 40 MG IV SOLR
40.0000 mg | INTRAVENOUS | Status: DC
  Administered 2017-11-06 – 2017-11-14 (×9): 40 mg via INTRAVENOUS
  Filled 2017-11-06 (×9): qty 40

## 2017-11-06 MED ORDER — INSULIN GLARGINE 100 UNIT/ML ~~LOC~~ SOLN
15.0000 [IU] | Freq: Every day | SUBCUTANEOUS | Status: DC
  Administered 2017-11-06 – 2017-11-13 (×8): 15 [IU] via SUBCUTANEOUS
  Filled 2017-11-06 (×9): qty 0.15

## 2017-11-06 MED ORDER — SODIUM CHLORIDE 0.9 % IV SOLN
1.0000 g | INTRAVENOUS | Status: DC
  Administered 2017-11-07 – 2017-11-08 (×2): 1 g via INTRAVENOUS
  Filled 2017-11-06: qty 10
  Filled 2017-11-06 (×2): qty 1

## 2017-11-06 MED ORDER — POTASSIUM CHLORIDE 10 MEQ/100ML IV SOLN
10.0000 meq | INTRAVENOUS | Status: AC
  Administered 2017-11-06 (×4): 10 meq via INTRAVENOUS
  Filled 2017-11-06 (×4): qty 100

## 2017-11-06 MED ORDER — LABETALOL HCL 5 MG/ML IV SOLN
10.0000 mg | INTRAVENOUS | Status: DC | PRN
  Administered 2017-11-06 – 2017-11-11 (×5): 10 mg via INTRAVENOUS
  Filled 2017-11-06 (×6): qty 4

## 2017-11-06 MED ORDER — ORAL CARE MOUTH RINSE
15.0000 mL | Freq: Two times a day (BID) | OROMUCOSAL | Status: DC
  Administered 2017-11-06 – 2017-11-14 (×16): 15 mL via OROMUCOSAL

## 2017-11-06 MED ORDER — SODIUM BICARBONATE 8.4 % IV SOLN
INTRAVENOUS | Status: DC
  Administered 2017-11-06 – 2017-11-08 (×3): via INTRAVENOUS
  Filled 2017-11-06 (×7): qty 150

## 2017-11-06 MED ORDER — METRONIDAZOLE IN NACL 5-0.79 MG/ML-% IV SOLN
500.0000 mg | Freq: Three times a day (TID) | INTRAVENOUS | Status: DC
  Administered 2017-11-06 – 2017-11-10 (×14): 500 mg via INTRAVENOUS
  Filled 2017-11-06 (×14): qty 100

## 2017-11-06 MED ORDER — POTASSIUM CHLORIDE 10 MEQ/100ML IV SOLN: 10.0000 meq | INTRAVENOUS | Status: DC

## 2017-11-06 MED ORDER — INSULIN GLARGINE 100 UNIT/ML ~~LOC~~ SOLN
30.0000 [IU] | Freq: Every day | SUBCUTANEOUS | Status: DC
  Filled 2017-11-06: qty 0.3

## 2017-11-06 MED ORDER — LIDOCAINE HCL 2 % EX GEL
1.0000 "application " | Freq: Once | CUTANEOUS | Status: DC
  Filled 2017-11-06: qty 5

## 2017-11-06 NOTE — Progress Notes (Signed)
RT NTS patient for moderate amount of Ferrie bile on lefty side of nasal passage. RN at bedside and. Patient was on 15L HFNC maintaining SATs 90% and HR 125. Patient tolerated well, no complications noted. RT decreased O2 to 10L for SATs 94%. Patient has orders to be transferred to ICU

## 2017-11-06 NOTE — Clinical Social Work Note (Signed)
Status update provided to Shannon East at [facility].    Matyas Baisley D, LCSW  

## 2017-11-06 NOTE — Progress Notes (Signed)
Subjective:  Patient sitting up in bed. No distress but appears to feel poorly. She has Wildasin emesis on her face and front of her gown. She has wet sound with her breathing. She knows where she is and who she is but not oriented to time. Complains of upper abd pain. Notes regarding last nights confusion and eating stool noted.   Objective: Vital signs in last 24 hours: Temp:  [97.5 F (36.4 C)-99 F (37.2 C)] 98.7 F (37.1 C) (03/20 0509) Pulse Rate:  [100-112] 100 (03/20 0509) Resp:  [20] 20 (03/19 1333) BP: (127-175)/(46-101) 138/77 (03/20 0509) SpO2:  [92 %-95 %] 92 % (03/20 0509) Last BM Date: 11/05/17 General:   Alert,  Appears uncomfortable.  Head:  Normocephalic and atraumatic. Eyes:  Sclera clear, no icterus.  Chest: CTA bilaterally with crackles.    Heart:  Regular rate and rhythm; no murmurs, clicks, rubs,  or gallops. Abdomen:  Soft, full in upper abd. Distended. No bowel sounds. No guarding or rebound.   Extremities:  Without clubbing, deformity or edema. Neurologic:  Alert and  oriented x2;    Psych:  Alert and cooperative. Normal mood and affect.  Intake/Output from previous day: 03/19 0701 - 03/20 0700 In: 2280 [P.O.:480; I.V.:1800] Out: 350 [Urine:300; Emesis/NG output:50] Intake/Output this shift: No intake/output data recorded.  Lab Results: CBC Recent Labs    11/05/17 0415 11/06/17 0528  WBC 18.7* 21.0*  HGB 13.3 13.8  HCT 41.5 43.8  MCV 88.9 89.8  PLT 307 298   BMET Recent Labs    11/04/17 0531 11/05/17 0415 11/06/17 0528  NA 144 143 142  K 2.7* 3.1* 3.3*  CL 117* 117* 117*  CO2 17* 15* 12*  GLUCOSE 96 119* 133*  BUN 11 13 18   CREATININE 0.95 0.98 1.00  CALCIUM 8.7* 8.5* 8.5*   LFTs Recent Labs    11/06/17 0528  BILITOT 1.0  ALKPHOS 65  AST 35  ALT 25  PROT 6.0*  ALBUMIN 2.9*   No results for input(s): LIPASE in the last 72 hours. PT/INR No results for input(s): LABPROT, INR in the last 72 hours.    Imaging Studies: Ct  Abdomen Pelvis Wo Contrast  Result Date: 11/05/2017 CLINICAL DATA:  Abdominal distention and weakness. EXAM: CT ABDOMEN AND PELVIS WITHOUT CONTRAST TECHNIQUE: Multidetector CT imaging of the abdomen and pelvis was performed following the standard protocol without IV contrast. COMPARISON:  08/30/2016 and 03/10/2010 CT, abdomen radiographs from 11/05/2017. FINDINGS: Lower chest: Small bilateral pleural effusions right greater than left with cardiomegaly. Small hiatal hernia is noted. Hepatobiliary: Status post cholecystectomy. Small subcapsular calcification in the right hepatic lobe likely representing is tiny granuloma. A 12 mm water attenuating cyst or hemangioma along the anterior aspect of the left hepatic lobe is redemonstrated and is unchanged. No biliary dilatation. Pancreas: Atrophic pancreas without ductal dilatation, mass or inflammation. Spleen: Normal size spleen without mass. Adrenals/Urinary Tract: Stable appearance of the adrenal glands. Stable nodular thickening of the right adrenal apex unchanged in appearance measuring 15 x 11 mm dating back to 2011 consistent with a benign finding. Punctate hyperdensities within the interpolar aspect of the right kidney likely represent vascular calcifications. No hydroureteronephrosis. The urinary bladder is nondistended and demonstrates no focal mural thickening or calculus. Stomach/Bowel: There is a small stable hiatal hernia. Fluid-filled distention of the stomach is noted without mural thickening. Normal small bowel rotation is identified. Fluid-filled distention of small bowel loops are identified from the third portion of the duodenum through jejunum  to the level of the distal ileum measuring up to 2.8 cm. Streaky bandlike adhesions are noted in the right hemipelvis likely contributing to this obstructive bowel gas pattern. Liquid stool is seen within colon consistent with diarrheal disease. No mural thickening is identified. Normal appearance of the  appendix. Vascular/Lymphatic: Aortoiliac atherosclerosis without aneurysm. No lymphadenopathy. Reproductive: Status post hysterectomy.  No adnexal mass. Other: Small amount of ascites adjacent to the liver. Musculoskeletal: L1-2 marked degenerative disc disease. No acute nor suspicious osseous abnormality. Multilevel degenerative lumbar facet arthropathy from at least L3 through S1. Mild soft tissue anasarca is noted. IMPRESSION: 1. Abnormal fluid-filled distention of small intestine from jejunum to distal ileum with transition point in the right lower quadrant likely related to postoperative adhesions. This appears to be contributing to at least a high-grade partial SBO or early SBO. Fluid-filled large bowel loops are noted without obstruction compatible with diarrheal disease. 2. Status post hysterectomy.  No adnexal mass. 3. Small hiatal hernia. 4. Lumbar degenerative facet arthropathy. Degenerative disc disease L1-2. Electronically Signed   By: Tollie Ethavid  Kwon M.D.   On: 11/05/2017 18:27   Dg Chest 2 View  Result Date: 11/05/2017 CLINICAL DATA:  Leukocytosis.  Patient admitted for gastroenteritis. EXAM: CHEST - 2 VIEW COMPARISON:  10/31/2017 FINDINGS: Low lung volumes with crowding of interstitial lung markings and mild pulmonary vascular congestion. Small posterior pleural effusions. Stable cardiomegaly with aortic atherosclerosis. Loop recording device projects over the left heart border. No acute pulmonary consolidation, effusion or pneumothorax. Remote bilateral rib fractures. No acute osseous abnormality. IMPRESSION: Low lung volumes with stable cardiomegaly and aortic atherosclerosis. Mild pulmonary vascular congestion. Small posterior pleural effusions are noted. Electronically Signed   By: Tollie Ethavid  Kwon M.D.   On: 11/05/2017 18:09   Dg Abd 1 View  Result Date: 11/05/2017 CLINICAL DATA:  Abdominal distension EXAM: ABDOMEN - 1 VIEW COMPARISON:  CT abdomen 08/30/2016 FINDINGS: There is significant diffuse  small bowel dilatation. There is gaseous distention of the colon. There is no evidence of pneumoperitoneum, portal venous gas or pneumatosis. There are no pathologic calcifications along the expected course of the ureters. The osseous structures are unremarkable. IMPRESSION: Gaseous distension of the small bowel and to lesser extent the colon, as can be seen with an ileus versus distal bowel obstruction. Electronically Signed   By: Elige KoHetal  Patel   On: 11/05/2017 10:09   Ct Head Wo Contrast  Result Date: 10/31/2017 CLINICAL DATA:  Altered level of consciousness, slurring words EXAM: CT HEAD WITHOUT CONTRAST TECHNIQUE: Contiguous axial images were obtained from the base of the skull through the vertex without intravenous contrast. COMPARISON:  CT brain scan of 08/30/2016 FINDINGS: Brain: The ventricular system remains prominent as do the cortical sulci, indicative of diffuse atrophy. The septum is midline in position. Significant small vessel ischemic change is noted throughout the periventricular white matter and bilateral old lacunar infarcts are noted. No definite acute interval change is seen. No hemorrhage is noted and there is no evidence of mass effect. Vascular: No vascular abnormality is seen on this unenhanced study. Skull: On bone window images, no calvarial abnormality is seen Sinuses/Orbits: .  The paranasal sinuses appear well pneumatized. Other: None. IMPRESSION: 1. Stable diffuse atrophy and significant small vessel ischemic change throughout the periventricular white matter. 2. Stable old lacunar infarcts bilaterally. No definite acute process. Electronically Signed   By: Dwyane DeePaul  Barry M.D.   On: 10/31/2017 10:10   Mr Brain Wo Contrast  Result Date: 10/31/2017 CLINICAL DATA:  67 year old  female with 2 days of altered mental status. No known injury. EXAM: MRI HEAD WITHOUT CONTRAST TECHNIQUE: Multiplanar, multiecho pulse sequences of the brain and surrounding structures were obtained without  intravenous contrast. COMPARISON:  Head CT without contrast 0955 hours today. Brain MRI 11/17/2014. FINDINGS: Brain: No restricted diffusion or evidence of acute infarction. Chronic infarcts in the left basal ganglia, left thalamus, bilateral corona radiata, and right middle frontal gyrus with encephalomalacia and some hemosiderin are stable since 2016. Wallerian degeneration has developed in the right cerebral peduncle since that time. Additional widespread patchy cerebral white matter T2 and FLAIR hyperintensity has not significantly changed since 2016. Small lacunar infarcts in the right pons are stable to increased since the prior MRI, but chronic. The cerebellum remains normal. No midline shift, mass effect, evidence of mass lesion, ventriculomegaly, extra-axial collection or acute intracranial hemorrhage. Cervicomedullary junction and pituitary are within normal limits. Vascular: Major intracranial vascular flow voids are stable since 2016. Skull and upper cervical spine: Negative visible cervical spine. Normal bone marrow signal. Sinuses/Orbits: Interval postoperative changes to the left globe. Otherwise negative orbits soft tissues. Paranasal sinuses and mastoids are stable and well pneumatized. Other: Scalp and face soft tissues appear negative. IMPRESSION: 1.  No acute intracranial abnormality. 2. Advanced chronic ischemic disease without significant progression since the 2016 MRI. Wallerian degeneration has developed along the right cerebral peduncle. Electronically Signed   By: Odessa Fleming M.D.   On: 10/31/2017 11:20   Dg Chest Portable 1 View  Result Date: 10/31/2017 CLINICAL DATA:  Weakness.  Diabetes. EXAM: PORTABLE CHEST 1 VIEW COMPARISON:  CT 11/19/2014. FINDINGS: Mediastinum hilar structures are normal. Cardiomegaly with normal pulmonary vascularity. Low lung volumes with mild basilar atelectasis. Tiny rounded radiopacity noted projected over lower anterior rib is unchanged and is most likely a  bone island. IMPRESSION: 1.  Cardiomegaly.  No pulmonary venous congestion. 2. Low lung volumes with mild basilar atelectasis. No acute infiltrate. Electronically Signed   By: Maisie Fus  Register   On: 10/31/2017 09:27  [2 weeks]   Assessment:  67 year old female presenting from [facility] with altered mental status, vomiting, and loose stool, found to have acute renal injury and possible UTI. Initially, she improved from a GI standpoint with supportive measures; however, Cdiff was checked due to recurrent diarrhea while admitted, which was negative. No GI pathogen panel. Lomitil started on 3/17. Clinically, patient had improved with addition of Lomotil, with decreased frequency and improved stool consistency but Lomotil has since been stopped.   Yesterday she complained of 9/10 pain but clinically did not appear in distress. Abdomen more distended, hypoactive and tympanitic bowel sounds. Nausea noted but without vomiting. KUB and then CT ordered with concern for high grade partial SBO vs early SBO.    Today she has bump in her WBC further. No bowel sounds on exam. She has had vomiting as noted. Her breathing has declined. She is at risk of aspiration with vomiting. Stool has been resubmitted for testing. Cdiff antigen and toxin negative again. GI panel pending.  Blood cultures pending. cxr yesterday with small pelural effusions and mild pulmonary vascular congestion.     At some point, needs outpatient routine screening colonoscopy.   Plan: 1. Plans for NGT.  2. Agree with surgery consult.  3. Notified Dr. Laural Benes of respiratory concerns (new crackles). 4. F/u pending stool test.    Leanna Battles. Dixon Boos [facility] [phone] 3/20/20199:09 AM     LOS: 2 days

## 2017-11-06 NOTE — Progress Notes (Signed)
Dr. Robb Matarrtiz made aware of pt being nauseated and having given Zofran. Dr. Robb Matarrtiz stated to monitor pt and page if her symptoms get any worse.

## 2017-11-06 NOTE — Progress Notes (Signed)
Gave report to ICU nurse, Tiffany. Transported pt to ICU room 3 due to hypoxia.

## 2017-11-06 NOTE — Progress Notes (Signed)
NG was at 19cm, ann hunt advanced NG tube 3 additional Cm after call from xray.recieved in report.  Efe Fazzino [employer] Chisom Aust, RN

## 2017-11-06 NOTE — Progress Notes (Signed)
Dr. Henreitta LeberBridges said since the pt is nauseous, make her NPO. Dr. B also said she would not place a NG tube at this time since pt is having BMs and it is a partial obstruction.

## 2017-11-06 NOTE — Progress Notes (Signed)
PROGRESS NOTE    ABBIE DYKEMAN  AVW:[id number] DOB: Mar 03, 1951 DOA: 10/31/2017 PCP: Bernerd Limbo, MD    Brief Narrative:  67 year old female with a history of right MCA stroke, hypertension, diabetes, diastolic heart failure, who is a resident of nursing facility, presented to the hospital with altered mental status.  She was having vomiting and some loose stools.  Urinalysis indicated possible UTI.  She was noted to be dehydrated in any acute kidney injury on admission.  She was admitted for IV fluids and IV antibiotics.  Urine culture process.  Pt has developed acute partial SBO likely from abdominal adhesions.    Plan is to return to skilled nursing facility on discharge.  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 Partial SBO - likely from abdominal adhesions, patient appears distended and uncomfortable with bilious vomit, will order for NG placement for decompression, continue clears if tolerated. I suspect she likely aspirated this morning.  Will add additional IV anaerobic coverage.   2. Diarrhea. Resolved.  Stool was tested negative for C. Difficile x 2.    3. Acute Abdominal distention / Abd pain - I spoke with GI team, abd xray suspicious for distal sBO, CT scan confirms diagnosis. Placing NGT and asking for general surgery consult.    4. Leukocytosis - UTI not fully treated and also suspect aspiration, starting ceftriaxone and adding metronidazole.    5. E coli Urinary tract infection.  Treating with ceftriaxone.    6. Metabolic encephalopathy due to dehydration.  Continue gentle IV fluids.  Continue to monitor. 7. Hypernatremia - Resolved.  Changed IVFs to 0.45 NS K, recheck in AM.  8. Acute kidney  injury.  Resolved.   9. History of stroke with left-sided hemiparesis.  Supportive care.  Continue Plavix and statin. 10. Chronic diastolic congestive heart failure.  Appears compensated at this time.  Continue current management. 11. Diabetes.  Blood sugars have been stable.  Continue on Lantus and sliding scale insulin. 12. Hypokalemia.  Improving.  Replaced orally and in IVFs.  Magnesium replacement ordered as well.  13. Metabolic acidosis.  Restart bicarbonate infusion.    DVT prophylaxis: Heparin Code Status: Full code Family Communication: Discussed with son Disposition Plan: pending further work up for acute abdominal pain and distention  Antimicrobials:   Rocephin 3/14 >3/17  Subjective: Pt having some abdominal bloating and pain this morning.  No diarrhea.   Objective: Vitals:   11/05/17 1333 11/05/17 1543 11/05/17 2013 11/06/17 0509  BP: (!) 175/100 (!) 154/101 (!) 127/46 138/77  Pulse: (!) 112  (!) 104 100  Resp: 20     Temp: (!) 97.5 F (36.4 C)  99 F (37.2 C) 98.7 F (37.1 C)  TempSrc: Oral  Oral Oral  SpO2: 95%  95% 92%  Weight:      Height:        Intake/Output Summary (Last 24 hours) at 11/06/2017 1025 Last data filed at 11/06/2017 0557 Gross per 24 hour  Intake 1920 ml  Output 350 ml  Net 1570 ml   Filed Weights   10/31/17 1604 11/01/17 0521  Weight: 94.8 kg (208 lb 15.9 oz) 94.8 kg (208 lb 15.9 oz)    Examination:  General exam: Alert, awake, oriented x 3 Respiratory system: Clear to auscultation. Respiratory effort normal. Cardiovascular system: normal s1, s2 sounds.  No murmurs, rubs, gallops. Gastrointestinal system: Abdomen is mildly distended, soft and tender in left lower quadrant. No organomegaly or masses felt. Normal bowel sounds heard. No guarding.  Central nervous system: Alert and oriented. No focal neurological deficits. Extremities: No C/C/E, +pedal pulses Skin: No rashes, lesions or ulcers Psychiatry: Judgement and insight appear  normal. Mood & affect appropriate.   Data Reviewed: I have personally reviewed following labs and imaging studies  CBC: Recent Labs  Lab 10/31/17 0932 11/01/17 0504 11/03/17 0559 11/05/17 0415 11/06/17 0528  WBC 13.3* 10.4 12.3* 18.7* 21.0*  NEUTROABS 10.2*  --   --  14.0 17.0*  HGB 12.9 11.8* 12.6 13.3 13.8  HCT 40.8 37.9 39.5 41.5 43.8  MCV 89.5 90.0 88.2 88.9 89.8  PLT 264 246 281 307 298   Basic Metabolic Panel: Recent Labs  Lab 11/01/17 0504 11/02/17 0605 11/02/17 1652 11/03/17 0559 11/04/17 0531 11/05/17 0415 11/06/17 0528  NA 145 147*  --  151* 144 143 142  K 3.2* 2.8*  --  2.4* 2.7* 3.1* 3.3*  CL 118* 119*  --  121* 117* 117* 117*  CO2 17* 17*  --  18* 17* 15* 12*  GLUCOSE 255* 193*  --  163* 96 119* 133*  BUN 24* 14  --  12 11 13 18   CREATININE 1.11* 0.87  --  0.93 0.95 0.98 1.00  CALCIUM 8.5* 9.0  --  9.0 8.7* 8.5* 8.5*  MG 2.0  --  1.9  --  1.7 2.0 2.1   GFR: Estimated Creatinine Clearance: 58.6 mL/min (by C-G formula based on SCr of 1 mg/dL). Liver Function Tests: Recent Labs  Lab 10/31/17 0932 11/06/17 0528  AST 15 35  ALT 13* 25  ALKPHOS 70 65  BILITOT 1.0 1.0  PROT 7.3 6.0*  ALBUMIN 3.3* 2.9*   No results for input(s): LIPASE, AMYLASE in the last 168 hours. No results for input(s): AMMONIA in the last 168 hours. Coagulation Profile: No results for input(s): INR, PROTIME in the last 168 hours. Cardiac Enzymes: Recent Labs  Lab 10/31/17 0932  TROPONINI <0.03   BNP (last 3 results) No results for input(s): PROBNP in the last 8760 hours. HbA1C: No results for input(s): HGBA1C in the last 72 hours. CBG: Recent Labs  Lab 11/05/17 0733 11/05/17 1113 11/05/17 1712 11/05/17 2141 11/06/17 0737  GLUCAP 110* 127* 161* 144* 152*   Lipid Profile: No results for input(s): CHOL, HDL, LDLCALC, TRIG, CHOLHDL, LDLDIRECT in the last 72 hours. Thyroid Function Tests: No results for input(s): TSH, T4TOTAL, FREET4, T3FREE, THYROIDAB in the last  72 hours. Anemia Panel: No results for input(s): VITAMINB12, FOLATE, FERRITIN, TIBC, IRON, RETICCTPCT in the last 72 hours. Sepsis Labs: Recent Labs  Lab 11/05/17 1204  LATICACIDVEN 0.9    Recent Results (from the past 240 hour(s))  Urine Culture     Status: Abnormal   Collection Time: 10/31/17 10:14 AM  Result Value Ref Range Status   Specimen Description   Final    URINE, RANDOM Performed at [facility], [address], [place], [place] [address]    Special Requests   Final    NONE Performed at [facility] [address] [place], [place] [address]    Culture >=100,000 COLONIES/mL ESCHERICHIA COLI (A)  Final   Report Status 11/03/2017 FINAL  Final   Organism ID, Bacteria ESCHERICHIA COLI (A)  Final      Susceptibility   Escherichia coli - MIC*    AMPICILLIN >=32 RESISTANT Resistant     CEFAZOLIN <=4 SENSITIVE Sensitive     CEFTRIAXONE <=1 SENSITIVE Sensitive     CIPROFLOXACIN <=0.25 SENSITIVE Sensitive     GENTAMICIN >=16 RESISTANT Resistant     IMIPENEM <=0.25 SENSITIVE Sensitive     NITROFURANTOIN <=16 SENSITIVE Sensitive     TRIMETH/SULFA <=20 SENSITIVE Sensitive     AMPICILLIN/SULBACTAM 16 INTERMEDIATE Intermediate     PIP/TAZO <=4 SENSITIVE Sensitive     Extended ESBL NEGATIVE Sensitive     * >=100,000 COLONIES/mL ESCHERICHIA COLI  MRSA PCR Screening     Status: Abnormal   Collection Time: 10/31/17  4:22 PM  Result Value Ref Range Status   MRSA by PCR POSITIVE (A) NEGATIVE Final    Comment:        The GeneXpert MRSA Assay (FDA approved for NASAL specimens only), is one component of a comprehensive MRSA colonization surveillance program. It is not intended to diagnose MRSA infection nor to guide or monitor treatment for MRSA infections. RESULT CALLED TO, READ BACK BY AND VERIFIED WITH: JACKSON,N AT 614PM ON [phone] BY ISLEY,B Performed at [facility], [address], [place], [place] [address]   C difficile quick scan w PCR reflex     Status: None    Collection Time: 11/02/17  6:34 PM  Result Value Ref Range Status   C Diff antigen NEGATIVE NEGATIVE Final   C Diff toxin NEGATIVE NEGATIVE Final   C Diff interpretation No C. difficile detected.  Final    Comment: Performed at [facility], [address], [place], [place] [address]  C difficile quick scan w PCR reflex     Status: None   Collection Time: 11/06/17  3:45 AM  Result Value Ref Range Status   C Diff antigen NEGATIVE NEGATIVE Final   C Diff toxin NEGATIVE NEGATIVE Final   C Diff interpretation No C. difficile detected.  Final    Comment: Performed at [facility], [address], [place], [place] [address]  Culture, blood (Routine X 2) w Reflex to ID Panel     Status: None (Preliminary result)   Collection Time: 11/06/17  6:49 AM  Result Value Ref Range Status   Specimen Description BLOOD LEFT HAND  Final   Special Requests   Final    BOTTLES DRAWN AEROBIC ONLY Blood Culture adequate volume Performed at [facility], [address], [place], [place] [address]    Culture PENDING  Incomplete   Report Status PENDING  Incomplete  Culture, blood (Routine X 2) w Reflex to ID Panel     Status: None (Preliminary result)   Collection Time: 11/06/17  7:56 AM  Result Value Ref Range Status   Specimen Description BLOOD LEFT ARM  Final   Special Requests   Final    BOTTLES DRAWN AEROBIC AND ANAEROBIC Blood Culture adequate volume Performed at [facility], [address], [place], [place] [address]    Culture PENDING  Incomplete   Report Status PENDING  Incomplete    Radiology Studies: Ct Abdomen Pelvis Wo Contrast  Result Date: 11/05/2017 CLINICAL DATA:  Abdominal distention and weakness. EXAM: CT ABDOMEN AND PELVIS WITHOUT CONTRAST TECHNIQUE: Multidetector CT imaging of the abdomen and pelvis was performed following the standard protocol without IV contrast. COMPARISON:  08/30/2016 and 03/10/2010 CT, abdomen radiographs from 11/05/2017. FINDINGS: Lower chest: Small bilateral pleural  effusions right greater than left with cardiomegaly.  Small hiatal hernia is noted. Hepatobiliary: Status post cholecystectomy. Small subcapsular calcification in the right hepatic lobe likely representing is tiny granuloma. A 12 mm water attenuating cyst or hemangioma along the anterior aspect of the left hepatic lobe is redemonstrated and is unchanged. No biliary dilatation. Pancreas: Atrophic pancreas without ductal dilatation, mass or inflammation. Spleen: Normal size spleen without mass. Adrenals/Urinary Tract: Stable appearance of the adrenal glands. Stable nodular thickening of the right adrenal apex unchanged in appearance measuring 15 x 11 mm dating back to 2011 consistent with a benign finding. Punctate hyperdensities within the interpolar aspect of the right kidney likely represent vascular calcifications. No hydroureteronephrosis. The urinary bladder is nondistended and demonstrates no focal mural thickening or calculus. Stomach/Bowel: There is a small stable hiatal hernia. Fluid-filled distention of the stomach is noted without mural thickening. Normal small bowel rotation is identified. Fluid-filled distention of small bowel loops are identified from the third portion of the duodenum through jejunum to the level of the distal ileum measuring up to 2.8 cm. Streaky bandlike adhesions are noted in the right hemipelvis likely contributing to this obstructive bowel gas pattern. Liquid stool is seen within colon consistent with diarrheal disease. No mural thickening is identified. Normal appearance of the appendix. Vascular/Lymphatic: Aortoiliac atherosclerosis without aneurysm. No lymphadenopathy. Reproductive: Status post hysterectomy.  No adnexal mass. Other: Small amount of ascites adjacent to the liver. Musculoskeletal: L1-2 marked degenerative disc disease. No acute nor suspicious osseous abnormality. Multilevel degenerative lumbar facet arthropathy from at least L3 through S1. Mild soft tissue anasarca  is noted. IMPRESSION: 1. Abnormal fluid-filled distention of small intestine from jejunum to distal ileum with transition point in the right lower quadrant likely related to postoperative adhesions. This appears to be contributing to at least a high-grade partial SBO or early SBO. Fluid-filled large bowel loops are noted without obstruction compatible with diarrheal disease. 2. Status post hysterectomy.  No adnexal mass. 3. Small hiatal hernia. 4. Lumbar degenerative facet arthropathy. Degenerative disc disease L1-2. Electronically Signed   By: Tollie Eth M.D.   On: 11/05/2017 18:27   Dg Chest 2 View  Result Date: 11/05/2017 CLINICAL DATA:  Leukocytosis.  Patient admitted for gastroenteritis. EXAM: CHEST - 2 VIEW COMPARISON:  10/31/2017 FINDINGS: Low lung volumes with crowding of interstitial lung markings and mild pulmonary vascular congestion. Small posterior pleural effusions. Stable cardiomegaly with aortic atherosclerosis. Loop recording device projects over the left heart border. No acute pulmonary consolidation, effusion or pneumothorax. Remote bilateral rib fractures. No acute osseous abnormality. IMPRESSION: Low lung volumes with stable cardiomegaly and aortic atherosclerosis. Mild pulmonary vascular congestion. Small posterior pleural effusions are noted. Electronically Signed   By: Tollie Eth M.D.   On: 11/05/2017 18:09   Dg Abd 1 View  Result Date: 11/05/2017 CLINICAL DATA:  Abdominal distension EXAM: ABDOMEN - 1 VIEW COMPARISON:  CT abdomen 08/30/2016 FINDINGS: There is significant diffuse small bowel dilatation. There is gaseous distention of the colon. There is no evidence of pneumoperitoneum, portal venous gas or pneumatosis. There are no pathologic calcifications along the expected course of the ureters. The osseous structures are unremarkable. IMPRESSION: Gaseous distension of the small bowel and to lesser extent the colon, as can be seen with an ileus versus distal bowel obstruction.  Electronically Signed   By: Elige Ko   On: 11/05/2017 10:09    Scheduled Meds: . atorvastatin  20 mg Oral q1800  . baclofen  5 mg Oral BID  . clopidogrel  75 mg Oral Daily  . [  START ON 11/11/2017] cyanocobalamin  1,000 mcg Intramuscular Q30 days  . heparin  5,000 Units Subcutaneous Q8H  . insulin aspart  0-5 Units Subcutaneous QHS  . insulin aspart  0-9 Units Subcutaneous TID WC  . insulin glargine  30 Units Subcutaneous QHS  . levothyroxine  125 mcg Oral QAC breakfast  . metoprolol succinate  50 mg Oral Daily  . pantoprazole  40 mg Oral Daily   Continuous Infusions: . 0.45 % NaCl with KCl 20 mEq / L 75 mL/hr at 11/06/17 0330  . cefTRIAXone (ROCEPHIN)  IV    . metronidazole       LOS: 2 days    Time spent:  Standley Dakins, MD [facility] Pager [phone]  If 7PM-7AM, please contact night-coverage [email or web address] Password [email or web address] 11/06/2017, 10:25 AM

## 2017-11-06 NOTE — Progress Notes (Addendum)
RN and NT walked in room and pt has BM smeared all over her mouth as well as her hands and bedrails. Pt stated she is "eating poop" when asked what she was doing.  Stool sample taken, pt c/o nausea again. Patient bathed  Dr. Robb Matarrtiz paged and made aware that pt did throw up a small amt of [place] emesis after eating her feces-and he was reminded of CT scan results of SBO. Dr. Robb Matarrtiz stated to continue to monitor pt at this moment since she has only had one episode of emesis.  Zofran given. Will continue to monitor Will pass on to day shift RN.

## 2017-11-06 NOTE — Progress Notes (Signed)
11/06/2017 2:27 PM    Rapid Response Event   Called to see patient for rapid response, patient found by RN to be spitting up copious bilious secretions, She was having more respiratory distress and her vitals showed HR 125, she was hypoxic on HFNC and BP elevated.  NG was placed and patient transferred to stepdown unit.  NG was placed with return of [place] bilious material. Pt will be monitored closely.  NTS ordered.  Stat CXR and abd xray ordered.  Will continue supportive care and I called and update patient's son.    Maryln Manuel. Johnson MD

## 2017-11-06 NOTE — Progress Notes (Signed)
Attempted to place NG tube at bedside.  Patient did not tolerate. Vomited greenish, yellow bile while attempting to swallow NG tube.  Notified Dr. Laural BenesJohnson, will inform Dr. Henreitta LeberBridges as well.

## 2017-11-06 NOTE — Consult Note (Signed)
[facility] Consult  Reason for Consult: SBO  Referring Physician:  Dr. Wynetta Emery   Chief Complaint    Altered Mental Status      Dawn Ho is a 67 y.o. female.  HPI: Dawn Ho is a 67 yo who lives at [facility] and was admitted to the ED 3/14 with altered mental status with vomiting and diarrhea.  She was found to have an E coli UTI that has been treated while in the hospital, but has continued to have some issues with diarrhea and elevated WBC.  She was seen by GI yesterday and they recommended stool studies. She also had a KUB and CT performed yesterday that demonstrated concern for possible partial SBO.  She has continued to have stool and was confused overnight and was reported to be eating her stool by the night RN.   She also was reported to have 1 vomit yesterday per the documentation and was only 50cc, and the RN this AM after attempting to do the NG says that she vomited with that attempt and that the content was bilious.   On my exam and questions the patient was only oriented to self and was spiting up some bile stained spit.  She also had significant rhonchi that was audible with her breathing.  She reported no abdominal pain to me, but has reported some to others at other times.   Past Medical History:  Diagnosis Date  . CVA (cerebral infarction)   . Depression with anxiety   . Diabetes mellitus   . Dyslipidemia   . Fibromyalgia   . Hypertension   . TIA (transient ischemic attack)     Past Surgical History:  Procedure Laterality Date  . ABDOMINAL HYSTERECTOMY     age 25, "removed one ovary- excessive bleeding"  . CESAREAN SECTION    . CHOLECYSTECTOMY    . LOOP RECORDER IMPLANT     Dr. Rayann Heman placed 10/15/2014  . TEE WITHOUT CARDIOVERSION N/A 10/15/2014   Procedure: TRANSESOPHAGEAL ECHOCARDIOGRAM (TEE);  Surgeon: Fay Records, MD;  Location: [facility] ENDOSCOPY;  Service: Cardiovascular;  Laterality: N/A;    Family History  Problem Relation Age of  Onset  . Heart disease Father   . Hypertension Sister   . Colon cancer Neg Hx   . Colon polyps Neg Hx     Social History   Tobacco Use  . Smoking status: Never Smoker  . Smokeless tobacco: Never Used  Substance Use Topics  . Alcohol use: No  . Drug use: No    Medications:  I have reviewed the patient's current medications. Prior to Admission:  Medications Prior to Admission  Medication Sig Dispense Refill Last Dose  . acetaminophen (TYLENOL) 500 MG tablet Take 1,000 mg by mouth 2 (two) times daily as needed for moderate pain.    10/31/2017 at 0600  . Amino Acids-Protein Hydrolys (FEEDING SUPPLEMENT, PRO-STAT SUGAR FREE 64,) LIQD Take 30 mLs by mouth daily.   10/30/2017 at 0900  . aspirin EC 81 MG tablet Take 81 mg by mouth daily.   10/30/2017 at 0900  . atorvastatin (LIPITOR) 20 MG tablet Take 1 tablet (20 mg total) by mouth daily at 6 PM.   10/30/2017 at 2200  . baclofen (LIORESAL) 10 MG tablet Take 1 tablet (10 mg total) by mouth 3 (three) times daily. Start one tablet twice daily x 2 weeks then three times daily (Patient taking differently: Take 10 mg by mouth 2 (two) times daily as needed. ) 90  each 3 10/30/2017 at 1700  . clopidogrel (PLAVIX) 75 MG tablet Take 1 tablet (75 mg total) by mouth daily.   10/30/2017 at 0900  . esomeprazole (NEXIUM) 40 MG packet Take 40 mg by mouth daily before breakfast.   10/31/2017 at 0630  . furosemide (LASIX) 20 MG tablet Take 60 mg by mouth daily.    10/31/2017 at 0600  . gabapentin (NEURONTIN) 300 MG capsule Take 1 capsule (300 mg total) by mouth at bedtime. (Patient taking differently: Take 300 mg by mouth 2 (two) times daily. )   10/30/2017 at 2100  . insulin glargine (LANTUS) 100 UNIT/ML injection Inject 0.65 mLs (65 Units total) into the skin at bedtime. (Patient taking differently: Inject 70 Units into the skin at bedtime. ) 10 mL 11 10/30/2017 at 2100  . insulin NPH Human (HUMULIN N,NOVOLIN N) 100 UNIT/ML injection Inject 12 Units into the skin  daily.   10/30/2017 at 0900  . insulin regular (NOVOLIN R,HUMULIN R) 100 units/mL injection Inject 15 Units into the skin daily. 12 units before breakfast, 15 units before lunch and dinner   10/30/2017 at 1200  . levothyroxine (SYNTHROID, LEVOTHROID) 112 MCG tablet Take 112 mcg by mouth daily.    10/31/2017 at 0630  . LORazepam (ATIVAN) 0.5 MG tablet Take one tablet by mouth twice daily for anxiety 60 tablet 5 10/30/2017 at 2100  . losartan (COZAAR) 100 MG tablet Take 100 mg by mouth daily.     10/30/2017 at 0900  . metoprolol tartrate (LOPRESSOR) 50 MG tablet Take 25-50 mg by mouth 2 (two) times daily. Patient takes 16m in the morning and 272min the evening   10/30/2017 at 2100  . Multiple Vitamins-Minerals (MULTIVITAMIN WITH MINERALS) tablet Take 1 tablet by mouth daily.   10/30/2017 at 0900  . nitroGLYCERIN (NITROSTAT) 0.4 MG SL tablet Place 0.4 mg under the tongue every 5 (five) minutes as needed for chest pain.     . Marland KitchenxyCODONE (OXY IR/ROXICODONE) 5 MG immediate release tablet Take 5 mg by mouth 2 (two) times daily.   10/30/2017 at 2100  . potassium chloride SA (K-DUR,KLOR-CON) 20 MEQ tablet Take 20 mEq by mouth daily.     10/30/2017 at 0900  . sennosides-docusate sodium (SENOKOT-S) 8.6-50 MG tablet Take 1 tablet by mouth daily.   10/30/2017 at 2200  . sertraline (ZOLOFT) 100 MG tablet Take 100 mg by mouth daily.    10/30/2017 at 0900   Scheduled: . atorvastatin  20 mg Oral q1800  . baclofen  5 mg Oral BID  . clopidogrel  75 mg Oral Daily  . [START ON 11/11/2017] cyanocobalamin  1,000 mcg Intramuscular Q30 days  . heparin  5,000 Units Subcutaneous Q8H  . insulin aspart  0-5 Units Subcutaneous QHS  . insulin aspart  0-9 Units Subcutaneous TID WC  . insulin glargine  15 Units Subcutaneous QHS  . levothyroxine  125 mcg Oral QAC breakfast  . metoprolol succinate  50 mg Oral Daily  . pantoprazole  40 mg Oral Daily   Continuous: . cefTRIAXone (ROCEPHIN)  IV    . metronidazole 500 mg (11/06/17 1232)   .  sodium bicarbonate  infusion 1000 mL     PRYPP:[id number]*OR** acetaminophen, alum & mag hydroxide-simeth, hydrALAZINE, ondansetron (ZOFRAN) IV   ROS:  Full review of systems not able to be obtained due to patient being disoriented.  No abdominal pain complaints No shortness of breath complaints  Review of systems not obtained due to patient factors.  Blood pressure  138/77, pulse 100, temperature 98.7 F (37.1 C), temperature source Oral, resp. rate 20, height _0  (1.575 m), weight 208 lb 15.9 oz (94.8 kg), SpO2 92 %. Physical Exam  Constitutional: Vital signs are normal. She does not have a sickly appearance. No distress.  HENT:  Head: Normocephalic.  Eyes: Pupils are equal, round, and reactive to light.  Neck: Normal range of motion.  Cardiovascular: Normal rate.  Pulmonary/Chest: Effort normal. She has rales.  Abdominal: Soft. There is no rebound and no guarding.  Upper abdominal distention; mild tenderness in the epigastric region, soft throughout  Musculoskeletal: She exhibits no edema.  Neurological: She is alert. She is not disoriented.  Skin: Skin is warm and dry.  Psychiatric: Affect normal.  Memory not intact, difficulty answering question; does not know year or president or full history  Vitals reviewed.   Results: Results for orders placed or performed during the hospital encounter of 10/31/17 (from the past 48 hour(s))  Glucose, capillary     Status: None   Collection Time: 11/04/17  4:15 PM  Result Value Ref Range   Glucose-Capillary 93 65 - 99 mg/dL   Comment 1 Notify RN    Comment 2 Document in Chart   Glucose, capillary     Status: Abnormal   Collection Time: 11/04/17  9:32 PM  Result Value Ref Range   Glucose-Capillary 119 (H) 65 - 99 mg/dL   Comment 1 Notify RN    Comment 2 Document in Chart   Basic metabolic panel     Status: Abnormal   Collection Time: 11/05/17  4:15 AM  Result Value Ref Range   Sodium 143 135 - 145 mmol/L   Potassium 3.1  (L) 3.5 - 5.1 mmol/L   Chloride 117 (H) 101 - 111 mmol/L   CO2 15 (L) 22 - 32 mmol/L   Glucose, Bld 119 (H) 65 - 99 mg/dL   BUN 13 6 - 20 mg/dL   Creatinine, Ser 0.98 0.44 - 1.00 mg/dL   Calcium 8.5 (L) 8.9 - 10.3 mg/dL   GFR calc non Af Amer 58 (L) >60 mL/min   GFR calc Af Amer >60 >60 mL/min    Comment: (NOTE) The eGFR has been calculated using the CKD EPI equation. This calculation has not been validated in all clinical situations. eGFR's persistently <60 mL/min signify possible Chronic Kidney Disease.    Anion gap 11 5 - 15    Comment: Performed at [facility], [address] [place], [place] [address]  Magnesium     Status: None   Collection Time: 11/05/17  4:15 AM  Result Value Ref Range   Magnesium 2.0 1.7 - 2.4 mg/dL    Comment: Performed at [facility], [address] [place], [place] [address]  CBC with Differential/Platelet     Status: Abnormal   Collection Time: 11/05/17  4:15 AM  Result Value Ref Range   WBC 18.7 (H) 4.0 - 10.5 K/uL   RBC 4.67 3.87 - 5.11 MIL/uL   Hemoglobin 13.3 12.0 - 15.0 g/dL   HCT 41.5 36.0 - 46.0 %   MCV 88.9 78.0 - 100.0 fL   MCH 28.5 26.0 - 34.0 pg   MCHC 32.0 30.0 - 36.0 g/dL   RDW 15.4 11.5 - 15.5 %   Platelets 307 150 - 400 K/uL   Neutrophils Relative % 75 %   Neutro Abs 14.0 1.7 - 7.7 K/uL   Lymphocytes Relative 13 %   Lymphs Abs 2.4 0.7 - 4.0 K/uL  Monocytes Relative 9 %   Monocytes Absolute 1.6 0.1 - 1.0 K/uL   Eosinophils Relative 3 %   Eosinophils Absolute 0.6 0.0 - 0.7 K/uL   Basophils Relative 0 %   Basophils Absolute 0.1 0.0 - 0.1 K/uL    Comment: Performed at [facility], [address] [place], [place] [address]  Glucose, capillary     Status: Abnormal   Collection Time: 11/05/17  7:33 AM  Result Value Ref Range   Glucose-Capillary 110 (H) 65 - 99 mg/dL   Comment 1 Notify RN    Comment 2 Document in Chart   Glucose, capillary     Status: Abnormal   Collection Time: 11/05/17 11:13 AM  Result Value Ref Range    Glucose-Capillary 127 (H) 65 - 99 mg/dL   Comment 1 Notify RN    Comment 2 Document in Chart   Lactic acid, plasma     Status: None   Collection Time: 11/05/17 12:04 PM  Result Value Ref Range   Lactic Acid, Venous 0.9 0.5 - 1.9 mmol/L    Comment: Performed at [facility], [address], [place], [place] [address]  Glucose, capillary     Status: Abnormal   Collection Time: 11/05/17  5:12 PM  Result Value Ref Range   Glucose-Capillary 161 (H) 65 - 99 mg/dL  Urinalysis, Routine w reflex microscopic     Status: Abnormal   Collection Time: 11/05/17  7:00 PM  Result Value Ref Range   Color, Urine AMBER (A) YELLOW    Comment: BIOCHEMICALS MAY BE AFFECTED BY COLOR   APPearance CLOUDY (A) CLEAR   Specific Gravity, Urine 1.020 1.005 - 1.030   pH 6.0 5.0 - 8.0   Glucose, UA NEGATIVE NEGATIVE mg/dL   Hgb urine dipstick SMALL (A) NEGATIVE   Bilirubin Urine NEGATIVE NEGATIVE   Ketones, ur NEGATIVE NEGATIVE mg/dL   Protein, ur NEGATIVE NEGATIVE mg/dL   Nitrite NEGATIVE NEGATIVE   Leukocytes, UA MODERATE (A) NEGATIVE   RBC / HPF 6-30 0 - 5 RBC/hpf   WBC, UA TOO NUMEROUS TO COUNT 0 - 5 WBC/hpf   Bacteria, UA RARE (A) NONE SEEN   Squamous Epithelial / LPF 6-30 (A) NONE SEEN   Budding Yeast PRESENT     Comment: Performed at [facility], [address] [place], [place] [address]  Glucose, capillary     Status: Abnormal   Collection Time: 11/05/17  9:41 PM  Result Value Ref Range   Glucose-Capillary 144 (H) 65 - 99 mg/dL  C difficile quick scan w PCR reflex     Status: None   Collection Time: 11/06/17  3:45 AM  Result Value Ref Range   C Diff antigen NEGATIVE NEGATIVE   C Diff toxin NEGATIVE NEGATIVE   C Diff interpretation No C. difficile detected.     Comment: Performed at [facility], [address] [place], [place] [address]  CBC with Differential/Platelet     Status: Abnormal   Collection Time: 11/06/17  5:28 AM  Result Value Ref Range   WBC 21.0 (H) 4.0 - 10.5 K/uL   RBC 4.88  3.87 - 5.11 MIL/uL   Hemoglobin 13.8 12.0 - 15.0 g/dL   HCT 43.8 36.0 - 46.0 %   MCV 89.8 78.0 - 100.0 fL   MCH 28.3 26.0 - 34.0 pg   MCHC 31.5 30.0 - 36.0 g/dL   RDW 15.8 (H) 11.5 - 15.5 %   Platelets 298 150 - 400 K/uL   Neutrophils Relative %  81 %   Neutro Abs 17.0 (H) 1.7 - 7.7 K/uL   Lymphocytes Relative 9 %   Lymphs Abs 2.0 0.7 - 4.0 K/uL   Monocytes Relative 8 %   Monocytes Absolute 1.6 (H) 0.1 - 1.0 K/uL   Eosinophils Relative 2 %   Eosinophils Absolute 0.4 0.0 - 0.7 K/uL   Basophils Relative 0 %   Basophils Absolute 0.0 0.0 - 0.1 K/uL    Comment: Performed at [facility], [address], [place], [place] [address]  Comprehensive metabolic panel     Status: Abnormal   Collection Time: 11/06/17  5:28 AM  Result Value Ref Range   Sodium 142 135 - 145 mmol/L   Potassium 3.3 (L) 3.5 - 5.1 mmol/L   Chloride 117 (H) 101 - 111 mmol/L   CO2 12 (L) 22 - 32 mmol/L   Glucose, Bld 133 (H) 65 - 99 mg/dL   BUN 18 6 - 20 mg/dL   Creatinine, Ser 1.00 0.44 - 1.00 mg/dL   Calcium 8.5 (L) 8.9 - 10.3 mg/dL   Total Protein 6.0 (L) 6.5 - 8.1 g/dL   Albumin 2.9 (L) 3.5 - 5.0 g/dL   AST 35 15 - 41 U/L   ALT 25 14 - 54 U/L   Alkaline Phosphatase 65 38 - 126 U/L   Total Bilirubin 1.0 0.3 - 1.2 mg/dL   GFR calc non Af Amer 57 (L) >60 mL/min   GFR calc Af Amer >60 >60 mL/min    Comment: (NOTE) The eGFR has been calculated using the CKD EPI equation. This calculation has not been validated in all clinical situations. eGFR's persistently <60 mL/min signify possible Chronic Kidney Disease.    Anion gap 13 5 - 15    Comment: Performed at [facility], [address], [place], [place] [address]  Magnesium     Status: None   Collection Time: 11/06/17  5:28 AM  Result Value Ref Range   Magnesium 2.1 1.7 - 2.4 mg/dL    Comment: Performed at [facility], [address] [place], [place] [address]  Culture, blood (Routine X 2) w Reflex to ID Panel     Status: None (Preliminary result)    Collection Time: 11/06/17  6:49 AM  Result Value Ref Range   Specimen Description BLOOD LEFT HAND    Special Requests      BOTTLES DRAWN AEROBIC ONLY Blood Culture adequate volume Performed at [facility], [address], [place], [place] [address]    Culture PENDING    Report Status PENDING   Glucose, capillary     Status: Abnormal   Collection Time: 11/06/17  7:37 AM  Result Value Ref Range   Glucose-Capillary 152 (H) 65 - 99 mg/dL   Comment 1 Notify RN    Comment 2 Document in Chart   Culture, blood (Routine X 2) w Reflex to ID Panel     Status: None (Preliminary result)   Collection Time: 11/06/17  7:56 AM  Result Value Ref Range   Specimen Description BLOOD LEFT ARM    Special Requests      BOTTLES DRAWN AEROBIC AND ANAEROBIC Blood Culture adequate volume Performed at [facility], [address], [place], [place] [address]    Culture PENDING    Report Status PENDING   Lactic acid, plasma     Status: None   Collection Time: 11/06/17  9:00 AM  Result Value Ref Range   Lactic Acid, Venous 1.0 0.5 - 1.9 mmol/L  Comment: Performed at [facility], [address] [place], [place] [address]  Glucose, capillary     Status: Abnormal   Collection Time: 11/06/17 11:36 AM  Result Value Ref Range   Glucose-Capillary 152 (H) 65 - 99 mg/dL   Comment 1 Notify RN    Comment 2 Document in Chart    Personally reviewed CT scan- Small bowel with some fluid filled loops mildly dilated, transition in the RLQ with a slight gradual taper over a longer distance (images 67 yo 44), but no discrete transition; colon with fluid and air, cecum with large amount of air; and fluid/ stool in remaining colon   Hazy lower lung fields on CXR   Ct Abdomen Pelvis Wo Contrast  Result Date: 11/05/2017 CLINICAL DATA:  Abdominal distention and weakness. EXAM: CT ABDOMEN AND PELVIS WITHOUT CONTRAST TECHNIQUE: Multidetector CT imaging of the abdomen and pelvis was performed following the standard protocol  without IV contrast. COMPARISON:  08/30/2016 and 03/10/2010 CT, abdomen radiographs from 11/05/2017. FINDINGS: Lower chest: Small bilateral pleural effusions right greater than left with cardiomegaly. Small hiatal hernia is noted. Hepatobiliary: Status post cholecystectomy. Small subcapsular calcification in the right hepatic lobe likely representing is tiny granuloma. A 12 mm water attenuating cyst or hemangioma along the anterior aspect of the left hepatic lobe is redemonstrated and is unchanged. No biliary dilatation. Pancreas: Atrophic pancreas without ductal dilatation, mass or inflammation. Spleen: Normal size spleen without mass. Adrenals/Urinary Tract: Stable appearance of the adrenal glands. Stable nodular thickening of the right adrenal apex unchanged in appearance measuring 15 x 11 mm dating back to 2011 consistent with a benign finding. Punctate hyperdensities within the interpolar aspect of the right kidney likely represent vascular calcifications. No hydroureteronephrosis. The urinary bladder is nondistended and demonstrates no focal mural thickening or calculus. Stomach/Bowel: There is a small stable hiatal hernia. Fluid-filled distention of the stomach is noted without mural thickening. Normal small bowel rotation is identified. Fluid-filled distention of small bowel loops are identified from the third portion of the duodenum through jejunum to the level of the distal ileum measuring up to 2.8 cm. Streaky bandlike adhesions are noted in the right hemipelvis likely contributing to this obstructive bowel gas pattern. Liquid stool is seen within colon consistent with diarrheal disease. No mural thickening is identified. Normal appearance of the appendix. Vascular/Lymphatic: Aortoiliac atherosclerosis without aneurysm. No lymphadenopathy. Reproductive: Status post hysterectomy.  No adnexal mass. Other: Small amount of ascites adjacent to the liver. Musculoskeletal: L1-2 marked degenerative disc disease.  No acute nor suspicious osseous abnormality. Multilevel degenerative lumbar facet arthropathy from at least L3 through S1. Mild soft tissue anasarca is noted. IMPRESSION: 1. Abnormal fluid-filled distention of small intestine from jejunum to distal ileum with transition point in the right lower quadrant likely related to postoperative adhesions. This appears to be contributing to at least a high-grade partial SBO or early SBO. Fluid-filled large bowel loops are noted without obstruction compatible with diarrheal disease. 2. Status post hysterectomy.  No adnexal mass. 3. Small hiatal hernia. 4. Lumbar degenerative facet arthropathy. Degenerative disc disease L1-2. Electronically Signed   By: Ashley Royalty M.D.   On: 11/05/2017 18:27   Dg Chest 2 View  Result Date: 11/05/2017 CLINICAL DATA:  Leukocytosis.  Patient admitted for gastroenteritis. EXAM: CHEST - 2 VIEW COMPARISON:  10/31/2017 FINDINGS: Low lung volumes with crowding of interstitial lung markings and mild pulmonary vascular congestion. Small posterior pleural effusions. Stable cardiomegaly with aortic atherosclerosis. Loop recording device projects over the left heart  border. No acute pulmonary consolidation, effusion or pneumothorax. Remote bilateral rib fractures. No acute osseous abnormality. IMPRESSION: Low lung volumes with stable cardiomegaly and aortic atherosclerosis. Mild pulmonary vascular congestion. Small posterior pleural effusions are noted. Electronically Signed   By: Ashley Royalty M.D.   On: 11/05/2017 18:09   Dg Abd 1 View  Result Date: 11/05/2017 CLINICAL DATA:  Abdominal distension EXAM: ABDOMEN - 1 VIEW COMPARISON:  CT abdomen 08/30/2016 FINDINGS: There is significant diffuse small bowel dilatation. There is gaseous distention of the colon. There is no evidence of pneumoperitoneum, portal venous gas or pneumatosis. There are no pathologic calcifications along the expected course of the ureters. The osseous structures are  unremarkable. IMPRESSION: Gaseous distension of the small bowel and to lesser extent the colon, as can be seen with an ileus versus distal bowel obstruction. Electronically Signed   By: Kathreen Devoid   On: 11/05/2017 10:09   Dg Chest Port 1 View  Result Date: 11/06/2017 CLINICAL DATA:  Aspiration, rattling in the throat and chest. EXAM: PORTABLE CHEST 1 VIEW COMPARISON:  Chest x-ray of November 05, 2017 FINDINGS: The images are limited due to the patient's body habitus. The lungs are mildly hypoinflated which is accentuated by the lordotic positioning. There is no focal infiltrate. The interstitial markings are coarse however in the right infrahilar region. There is no definite pleural effusion. The cardiac silhouette is enlarged. The pulmonary vascularity is not engorged. IMPRESSION: The study is limited due to technical factors related to scatter effects. There may be atelectasis or infiltrate in the right lower lobe. Cardiomegaly without pulmonary vascular congestion. Electronically Signed   By: David  Martinique M.D.   On: 11/06/2017 11:26     Assessment & Plan:  ELFIE COSTANZA is a 67 y.o. female with diarrhea clinically and some degree of nausea and 1 episode of vomiting.  Her CT scan demonstrates at most a pSBO but given the amount of air and liquid in the colon, I would favor that she is more than likely just suffering from and ileus related to the other underlying issues.  Her WBC has been going up, but I cannot identify an intraabdominal source for that on the CT or by her exam.  Attempts at NG placement were unsuccessful due to the patient's inability to corporate and gagging.   -Would make NPO and correct electrolytes, K to 4, Phos to 3,  Mg to 2  -If vomits then would consider retrying NG but without her actively vomiting and also having Bms, unsure of the utility at this time -Unsure if Rhonchi was present prior to NG attempts or not, needs pulmonary toilet, CXR yesterday with some haziness at bases   - C dif is negative, would continue to look for other etiologies for her WBC but no real shift at this time   Discussed NG with RN.   Virl Cagey 11/06/2017, 12:49 PM

## 2017-11-07 DIAGNOSIS — K56600 Partial intestinal obstruction, unspecified as to cause: Secondary | ICD-10-CM

## 2017-11-07 DIAGNOSIS — T17908A Unspecified foreign body in respiratory tract, part unspecified causing other injury, initial encounter: Secondary | ICD-10-CM | POA: Diagnosis present

## 2017-11-07 DIAGNOSIS — R0603 Acute respiratory distress: Secondary | ICD-10-CM | POA: Diagnosis not present

## 2017-11-07 DIAGNOSIS — A09 Infectious gastroenteritis and colitis, unspecified: Secondary | ICD-10-CM

## 2017-11-07 LAB — CBC WITH DIFFERENTIAL/PLATELET
Basophils Absolute: 0 10*3/uL (ref 0.0–0.1)
Basophils Relative: 0 %
EOS ABS: 0.5 10*3/uL (ref 0.0–0.7)
EOS PCT: 3 %
HCT: 44.2 % (ref 36.0–46.0)
Hemoglobin: 14.1 g/dL (ref 12.0–15.0)
LYMPHS ABS: 1.9 10*3/uL (ref 0.7–4.0)
Lymphocytes Relative: 10 %
MCH: 29 pg (ref 26.0–34.0)
MCHC: 31.9 g/dL (ref 30.0–36.0)
MCV: 90.9 fL (ref 78.0–100.0)
MONOS PCT: 8 %
Monocytes Absolute: 1.5 10*3/uL — ABNORMAL HIGH (ref 0.1–1.0)
Neutro Abs: 15.6 10*3/uL — ABNORMAL HIGH (ref 1.7–7.7)
Neutrophils Relative %: 79 %
PLATELETS: 247 10*3/uL (ref 150–400)
RBC: 4.86 MIL/uL (ref 3.87–5.11)
RDW: 16 % — ABNORMAL HIGH (ref 11.5–15.5)
WBC: 19.5 10*3/uL — AB (ref 4.0–10.5)

## 2017-11-07 LAB — COMPREHENSIVE METABOLIC PANEL
ALK PHOS: 60 U/L (ref 38–126)
ALT: 26 U/L (ref 14–54)
AST: 33 U/L (ref 15–41)
Albumin: 2.9 g/dL — ABNORMAL LOW (ref 3.5–5.0)
Anion gap: 13 (ref 5–15)
BUN: 15 mg/dL (ref 6–20)
CHLORIDE: 114 mmol/L — AB (ref 101–111)
CO2: 16 mmol/L — AB (ref 22–32)
Calcium: 8.6 mg/dL — ABNORMAL LOW (ref 8.9–10.3)
Creatinine, Ser: 0.99 mg/dL (ref 0.44–1.00)
GFR calc Af Amer: >60 mL/min (ref >60)
GFR, EST NON AFRICAN AMERICAN: 58 mL/min — AB (ref >60)
Glucose, Bld: 124 mg/dL — ABNORMAL HIGH (ref 65–99)
Potassium: 3 mmol/L — ABNORMAL LOW (ref 3.5–5.1)
Sodium: 143 mmol/L (ref 135–145)
Total Bilirubin: 0.9 mg/dL (ref 0.3–1.2)
Total Protein: 6.2 g/dL — ABNORMAL LOW (ref 6.5–8.1)

## 2017-11-07 LAB — GLUCOSE, CAPILLARY
Glucose-Capillary: 127 mg/dL — ABNORMAL HIGH (ref 65–99)
Glucose-Capillary: 110 mg/dL — ABNORMAL HIGH (ref 65–99)
Glucose-Capillary: 128 mg/dL — ABNORMAL HIGH (ref 65–99)
Glucose-Capillary: 112 mg/dL — ABNORMAL HIGH (ref 65–99)

## 2017-11-07 LAB — MAGNESIUM: MAGNESIUM: 2 mg/dL (ref 1.7–2.4)

## 2017-11-07 LAB — PHOSPHORUS: PHOSPHORUS: 3 mg/dL (ref 2.5–4.6)

## 2017-11-07 MED ORDER — POTASSIUM CHLORIDE 10 MEQ/100ML IV SOLN
10.0000 meq | INTRAVENOUS | Status: AC
  Administered 2017-11-07 (×5): 10 meq via INTRAVENOUS
  Filled 2017-11-07 (×5): qty 100

## 2017-11-07 NOTE — Progress Notes (Signed)
PT Cancellation Note  Patient Details Name: Dawn Ho MRN: [id number] DOB: July 03, 1951   Cancelled Treatment:    Reason Eval/Treat Not Completed: Medical issues which prohibited therapy.  Patient transferred to a higher level of care and will need new PT consult when patient medically stable.  Thank you.    7:55 AM, 11/07/17 Ocie BobJames Devanta Daniel, MPT Physical Therapist with [facility] 336 [phone] office [phone] mobile phone

## 2017-11-07 NOTE — Progress Notes (Signed)
[facility] Progress Note     Subjective: More oriented today. Knows date, president, hospital, and self. Says having some abdominal pain. NG with minimal output. Had BM documented and reports having one currently.   Objective: Vital signs in last 24 hours: Temp:  [98.1 F (36.7 C)-99.5 F (37.5 C)] 98.8 F (37.1 C) (03/21 1110) Pulse Rate:  [92-127] 93 (03/21 0730) Resp:  [18-28] 19 (03/21 1110) BP: (133-175)/(73-105) 133/77 (03/21 1100) SpO2:  [85 %-96 %] 93 % (03/21 0730) Weight:  [218 lb 14.7 oz (99.3 kg)-220 lb 3.8 oz (99.9 kg)] 220 lb 3.8 oz (99.9 kg) (03/21 0600) Last BM Date: 11/05/17  Intake/Output from previous day: 03/20 0701 - 03/21 0700 In: 550 [I.V.:150; IV Piggyback:400] Out: 600 [Emesis/NG output:600] Intake/Output this shift: Total I/O In: 100 [IV Piggyback:100] Out: 150 [Emesis/NG output:150]  General appearance: alert, cooperative and no distress Resp: normal work of breathing GI: soft, tender in the epigastric region, some distention, no rebound or guarding  Lab Results:  Recent Labs    11/06/17 0528 11/07/17 0838  WBC 21.0* 19.5*  HGB 13.8 14.1  HCT 43.8 44.2  PLT 298 247   BMET Recent Labs    11/06/17 0528 11/07/17 0838  NA 142 143  K 3.3* 3.0*  CL 117* 114*  CO2 12* 16*  GLUCOSE 133* 124*  BUN 18 15  CREATININE 1.00 0.99  CALCIUM 8.5* 8.6*    Studies/Results: Ct Abdomen Pelvis Wo Contrast  Result Date: 11/05/2017 CLINICAL DATA:  Abdominal distention and weakness. EXAM: CT ABDOMEN AND PELVIS WITHOUT CONTRAST TECHNIQUE: Multidetector CT imaging of the abdomen and pelvis was performed following the standard protocol without IV contrast. COMPARISON:  08/30/2016 and 03/10/2010 CT, abdomen radiographs from 11/05/2017. FINDINGS: Lower chest: Small bilateral pleural effusions right greater than left with cardiomegaly. Small hiatal hernia is noted. Hepatobiliary: Status post cholecystectomy. Small subcapsular calcification  in the right hepatic lobe likely representing is tiny granuloma. A 12 mm water attenuating cyst or hemangioma along the anterior aspect of the left hepatic lobe is redemonstrated and is unchanged. No biliary dilatation. Pancreas: Atrophic pancreas without ductal dilatation, mass or inflammation. Spleen: Normal size spleen without mass. Adrenals/Urinary Tract: Stable appearance of the adrenal glands. Stable nodular thickening of the right adrenal apex unchanged in appearance measuring 15 x 11 mm dating back to 2011 consistent with a benign finding. Punctate hyperdensities within the interpolar aspect of the right kidney likely represent vascular calcifications. No hydroureteronephrosis. The urinary bladder is nondistended and demonstrates no focal mural thickening or calculus. Stomach/Bowel: There is a small stable hiatal hernia. Fluid-filled distention of the stomach is noted without mural thickening. Normal small bowel rotation is identified. Fluid-filled distention of small bowel loops are identified from the third portion of the duodenum through jejunum to the level of the distal ileum measuring up to 2.8 cm. Streaky bandlike adhesions are noted in the right hemipelvis likely contributing to this obstructive bowel gas pattern. Liquid stool is seen within colon consistent with diarrheal disease. No mural thickening is identified. Normal appearance of the appendix. Vascular/Lymphatic: Aortoiliac atherosclerosis without aneurysm. No lymphadenopathy. Reproductive: Status post hysterectomy.  No adnexal mass. Other: Small amount of ascites adjacent to the liver. Musculoskeletal: L1-2 marked degenerative disc disease. No acute nor suspicious osseous abnormality. Multilevel degenerative lumbar facet arthropathy from at least L3 through S1. Mild soft tissue anasarca is noted. IMPRESSION: 1. Abnormal fluid-filled distention of small intestine from jejunum to distal ileum with transition point in the right lower quadrant  likely related to postoperative adhesions. This appears to be contributing to at least a high-grade partial SBO or early SBO. Fluid-filled large bowel loops are noted without obstruction compatible with diarrheal disease. 2. Status post hysterectomy.  No adnexal mass. 3. Small hiatal hernia. 4. Lumbar degenerative facet arthropathy. Degenerative disc disease L1-2. Electronically Signed   By: Tollie Eth M.D.   On: 11/05/2017 18:27   Dg Chest 2 View  Result Date: 11/05/2017 CLINICAL DATA:  Leukocytosis.  Patient admitted for gastroenteritis. EXAM: CHEST - 2 VIEW COMPARISON:  10/31/2017 FINDINGS: Low lung volumes with crowding of interstitial lung markings and mild pulmonary vascular congestion. Small posterior pleural effusions. Stable cardiomegaly with aortic atherosclerosis. Loop recording device projects over the left heart border. No acute pulmonary consolidation, effusion or pneumothorax. Remote bilateral rib fractures. No acute osseous abnormality. IMPRESSION: Low lung volumes with stable cardiomegaly and aortic atherosclerosis. Mild pulmonary vascular congestion. Small posterior pleural effusions are noted. Electronically Signed   By: Tollie Eth M.D.   On: 11/05/2017 18:09   Dg Chest Port 1 View  Result Date: 11/06/2017 CLINICAL DATA:  Aspiration, rattling in the throat and chest. EXAM: PORTABLE CHEST 1 VIEW COMPARISON:  Chest x-ray of November 05, 2017 FINDINGS: The images are limited due to the patient's body habitus. The lungs are mildly hypoinflated which is accentuated by the lordotic positioning. There is no focal infiltrate. The interstitial markings are coarse however in the right infrahilar region. There is no definite pleural effusion. The cardiac silhouette is enlarged. The pulmonary vascularity is not engorged. IMPRESSION: The study is limited due to technical factors related to scatter effects. There may be atelectasis or infiltrate in the right lower lobe. Cardiomegaly without pulmonary  vascular congestion. Electronically Signed   By: David  [place] M.D.   On: 11/06/2017 11:26   Dg Chest Port 1v Same Day  Result Date: 11/06/2017 CLINICAL DATA:  Nasogastric tube placement EXAM: PORTABLE CHEST 1 VIEW COMPARISON:  Portable exam 1444 hours compared to 1107 hours FINDINGS: Nasogastric tube extends into abdomen. Enlargement of cardiac silhouette with pulmonary vascular congestion. Loop recorder projects over lower chest. Minimal RIGHT basilar atelectasis. Lungs otherwise grossly clear. Bones demineralized. IMPRESSION: Nasogastric tube extends into abdomen. Enlargement of cardiac silhouette with pulmonary vascular congestion and minimal RIGHT basilar atelectasis. Electronically Signed   By: Ulyses Southward M.D.   On: 11/06/2017 15:02   Dg Abd Portable 1v  Result Date: 11/06/2017 CLINICAL DATA:  Nasogastric tube placement EXAM: PORTABLE ABDOMEN - 1 VIEW COMPARISON:  Portable exam 1443 hours compared to CT abdomen and pelvis 11/05/2017 FINDINGS: Abdomen rotated to the RIGHT. Nasogastric tube extends through stomach with tip roughly projecting over pyloric region versus duodenal bulb. Air-filled loops of large and small bowel in upper abdomen. Heart appears enlarged with loop recorder noted. Lung bases clear. IMPRESSION: Tip of nasogastric tube projects roughly over pylorus or duodenal bulb on a rotated exam. Electronically Signed   By: Ulyses Southward M.D.   On: 11/06/2017 15:01    Anti-infectives: Anti-infectives (From admission, onward)   Start     Dose/Rate Route Frequency Ordered Stop   11/06/17 1000  metroNIDAZOLE (FLAGYL) IVPB 500 mg     500 mg 100 mL/hr over 60 Minutes Intravenous Every 8 hours 11/06/17 0953     11/06/17 0645  cefTRIAXone (ROCEPHIN) 1 g in sodium chloride 0.9 % 100 mL IVPB     1 g 200 mL/hr over 30 Minutes Intravenous Every 24 hours 11/06/17 0631     10/31/17  1330  metroNIDAZOLE (FLAGYL) IVPB 500 mg  Status:  Discontinued     500 mg 100 mL/hr over 60 Minutes Intravenous  Every 8 hours 10/31/17 1317 11/04/17 0733   10/31/17 1315  cefTRIAXone (ROCEPHIN) 1 g in sodium chloride 0.9 % 100 mL IVPB     1 g 200 mL/hr over 30 Minutes Intravenous Every 24 hours 10/31/17 1300 11/02/17 1554   10/31/17 1015  ciprofloxacin (CIPRO) IVPB 400 mg     400 mg 200 mL/hr over 60 Minutes Intravenous  Once 10/31/17 1013 10/31/17 1224      Assessment/Plan: Dawn Ho is a 67 yo with some diarrhea, nausea/vomiting, NG placed yesterday after some respiratory distress from likely some aspiration. Minimal output, and having BMs. Still with some abdominal pain. Favor ileus type picture overall. WBC remains elevated, unsure of etiology at this time. -Correct lytes, K to 4, Phos to 3, Mg to 2, would leave NPO for now and can consider putting NG to gravity but given the patients disorientation yesterday, would keep to suction for rest of the day until we know she is better able to protect her airway     LOS: 3 days    Dawn Ho 11/07/2017

## 2017-11-07 NOTE — Progress Notes (Signed)
PROGRESS NOTE    Dawn Ho  EXB:[id number] DOB: 1951-08-03 DOA: 10/31/2017 PCP: Bernerd Limbo, MD    Brief Narrative:  67 year old female with a history of right MCA stroke, hypertension, diabetes, diastolic heart failure, who is a resident of nursing facility, presented to the hospital with altered mental status.  She was having vomiting and some loose stools.  Urinalysis indicated possible UTI.  She was noted to be dehydrated in any acute kidney injury on admission.  She was admitted for IV fluids and IV antibiotics.  Urine culture process.  Pt has developed acute partial SBO likely from abdominal adhesions.    Plan is to return to skilled nursing facility on discharge.  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 Ileus - patient much less uncomfortable after NG placement for decompression, very low output from NG, continue NPO for now. I suspect she likely yesterday.  She is having BMs.  2. Diarrhea. Resolved.  Pt tested positive for Norovirus.  Stool was tested negative for C. Difficile x 2.    3. Leukocytosis - suspect this is from UTI and also suspect aspiration, starting ceftriaxone and added metronidazole.    4. E coli Urinary tract infection.  Treating with ceftriaxone, repeat UA was dirty likely from ongoing diarrhea.  5. Metabolic encephalopathy due to dehydration.  Continue gentle IV fluids.  Continue to monitor. 6. Hypernatremia - Resolved.  Changed IVFs to 0.45 NS K, recheck in AM.  7. Acute kidney injury.  Resolved.   8. History of stroke with left-sided hemiparesis.  Supportive care.  Continue Plavix and statin. 9. Chronic diastolic congestive heart  failure.  Appears compensated at this time.  Continue current management. 10. Diabetes.  Blood sugars have been stable.  Continue on Lantus and sliding scale insulin. 11. Hypokalemia.  Improving.  Replaced orally and in IVFs.  Magnesium replacement ordered as well.  12. Metabolic acidosis.  Restart bicarbonate infusion.    DVT prophylaxis: Heparin Code Status: Full code Family Communication: Discussed with son by telephone 3/20 Disposition Plan: pending further work up for acute abdominal pain and distention  Antimicrobials:   Rocephin 3/14 >3/17  Subjective: Pt a little less confused, says her abdomen feels better this morning.   Objective: Vitals:   11/07/17 1110 11/07/17 1200 11/07/17 1300 11/07/17 1400  BP:  (!) 159/83 (!) 145/82 (!) 162/84  Pulse:      Resp: 19 (!) 21 (!) 21 20  Temp: 98.8 F (37.1 C)     TempSrc: Axillary     SpO2:      Weight:      Height:        Intake/Output Summary (Last 24 hours) at 11/07/2017 1517 Last data filed at 11/07/2017 1400 Gross per 24 hour  Intake 1850 ml  Output 750 ml  Net 1100 ml   Filed Weights   11/01/17 0521 11/06/17 1544 11/07/17 0600  Weight: 94.8 kg (208 lb 15.9 oz) 99.3 kg (218 lb 14.7 oz) 99.9 kg (220 lb 3.8 oz)    Examination:  General exam: Alert, awake, oriented x 3 Respiratory system: Clear to auscultation. Respiratory effort normal. Cardiovascular system: normal  s1, s2 sounds.  No murmurs, rubs, gallops. Gastrointestinal system: Abdomen is mildly distended, but improved from prior,  soft and tender in left lower quadrant. No organomegaly or masses felt. Normal bowel sounds heard. No guarding.  Central nervous system: Alert and oriented. No focal neurological deficits. Extremities: No C/C/E, +pedal pulses Skin: No rashes, lesions or ulcers Psychiatry: Judgement and insight appear normal. Mood & affect appropriate.   Data Reviewed: I have personally reviewed following labs and imaging studies  CBC: Recent Labs   Lab 11/01/17 0504 11/03/17 0559 11/05/17 0415 11/06/17 0528 11/07/17 0838  WBC 10.4 12.3* 18.7* 21.0* 19.5*  NEUTROABS  --   --  14.0 17.0* 15.6*  HGB 11.8* 12.6 13.3 13.8 14.1  HCT 37.9 39.5 41.5 43.8 44.2  MCV 90.0 88.2 88.9 89.8 90.9  PLT 246 281 307 298 247   Basic Metabolic Panel: Recent Labs  Lab 11/02/17 1652 11/03/17 0559 11/04/17 0531 11/05/17 0415 11/06/17 0528 11/07/17 0838  NA  --  151* 144 143 142 143  K  --  2.4* 2.7* 3.1* 3.3* 3.0*  CL  --  121* 117* 117* 117* 114*  CO2  --  18* 17* 15* 12* 16*  GLUCOSE  --  163* 96 119* 133* 124*  BUN  --  12 11 13 18 15   CREATININE  --  0.93 0.95 0.98 1.00 0.99  CALCIUM  --  9.0 8.7* 8.5* 8.5* 8.6*  MG 1.9  --  1.7 2.0 2.1 2.0  PHOS  --   --   --   --  3.0 3.0   GFR: Estimated Creatinine Clearance: 58.6 mL/min (by C-G formula based on SCr of 0.99 mg/dL). Liver Function Tests: Recent Labs  Lab 11/06/17 0528 11/07/17 0838  AST 35 33  ALT 25 26  ALKPHOS 65 60  BILITOT 1.0 0.9  PROT 6.0* 6.2*  ALBUMIN 2.9* 2.9*   No results for input(s): LIPASE, AMYLASE in the last 168 hours. No results for input(s): AMMONIA in the last 168 hours. Coagulation Profile: No results for input(s): INR, PROTIME in the last 168 hours. Cardiac Enzymes: No results for input(s): CKTOTAL, CKMB, CKMBINDEX, TROPONINI in the last 168 hours. BNP (last 3 results) No results for input(s): PROBNP in the last 8760 hours. HbA1C: No results for input(s): HGBA1C in the last 72 hours. CBG: Recent Labs  Lab 11/06/17 1136 11/06/17 1609 11/06/17 2059 11/07/17 0724 11/07/17 1109  GLUCAP 152* 149* 145* 127* 110*   Lipid Profile: No results for input(s): CHOL, HDL, LDLCALC, TRIG, CHOLHDL, LDLDIRECT in the last 72 hours. Thyroid Function Tests: No results for input(s): TSH, T4TOTAL, FREET4, T3FREE, THYROIDAB in the last 72 hours. Anemia Panel: No results for input(s): VITAMINB12, FOLATE, FERRITIN, TIBC, IRON, RETICCTPCT in the last 72  hours. Sepsis Labs: Recent Labs  Lab 11/05/17 1204 11/06/17 0900  LATICACIDVEN 0.9 1.0    Recent Results (from the past 240 hour(s))  Urine Culture     Status: Abnormal   Collection Time: 10/31/17 10:14 AM  Result Value Ref Range Status   Specimen Description   Final    URINE, RANDOM Performed at [facility], [address], [place], [place] [address]    Special Requests   Final    NONE Performed at [facility], [address], [place], [place] [address]    Culture >=100,000 COLONIES/mL ESCHERICHIA COLI (A)  Final   Report Status 11/03/2017 FINAL  Final   Organism ID, Bacteria ESCHERICHIA COLI (A)  Final  Susceptibility   Escherichia coli - MIC*    AMPICILLIN >=32 RESISTANT Resistant     CEFAZOLIN <=4 SENSITIVE Sensitive     CEFTRIAXONE <=1 SENSITIVE Sensitive     CIPROFLOXACIN <=0.25 SENSITIVE Sensitive     GENTAMICIN >=16 RESISTANT Resistant     IMIPENEM <=0.25 SENSITIVE Sensitive     NITROFURANTOIN <=16 SENSITIVE Sensitive     TRIMETH/SULFA <=20 SENSITIVE Sensitive     AMPICILLIN/SULBACTAM 16 INTERMEDIATE Intermediate     PIP/TAZO <=4 SENSITIVE Sensitive     Extended ESBL NEGATIVE Sensitive     * >=100,000 COLONIES/mL ESCHERICHIA COLI  MRSA PCR Screening     Status: Abnormal   Collection Time: 10/31/17  4:22 PM  Result Value Ref Range Status   MRSA by PCR POSITIVE (A) NEGATIVE Final    Comment:        The GeneXpert MRSA Assay (FDA approved for NASAL specimens only), is one component of a comprehensive MRSA colonization surveillance program. It is not intended to diagnose MRSA infection nor to guide or monitor treatment for MRSA infections. RESULT CALLED TO, READ BACK BY AND VERIFIED WITH: JACKSON,N AT 614PM ON [id number] BY ISLEY,B Performed at [facility], [address], [place], [place] [address]   C difficile quick scan w PCR reflex     Status: None   Collection Time: 11/02/17  6:34 PM  Result Value Ref Range Status   C Diff antigen NEGATIVE  NEGATIVE Final   C Diff toxin NEGATIVE NEGATIVE Final   C Diff interpretation No C. difficile detected.  Final    Comment: Performed at [facility], [address] [place], [place] [address]  C difficile quick scan w PCR reflex     Status: None   Collection Time: 11/06/17  3:45 AM  Result Value Ref Range Status   C Diff antigen NEGATIVE NEGATIVE Final   C Diff toxin NEGATIVE NEGATIVE Final   C Diff interpretation No C. difficile detected.  Final    Comment: Performed at [facility], [address] [place], [place] [address]  Gastrointestinal Panel by PCR , Stool     Status: Abnormal   Collection Time: 11/06/17  3:45 AM  Result Value Ref Range Status   Campylobacter species NOT DETECTED NOT DETECTED Final   Plesimonas shigelloides NOT DETECTED NOT DETECTED Final   Salmonella species NOT DETECTED NOT DETECTED Final   Yersinia enterocolitica NOT DETECTED NOT DETECTED Final   Vibrio species NOT DETECTED NOT DETECTED Final   Vibrio cholerae NOT DETECTED NOT DETECTED Final   Enteroaggregative E coli (EAEC) NOT DETECTED NOT DETECTED Final   Enteropathogenic E coli (EPEC) NOT DETECTED NOT DETECTED Final   Enterotoxigenic E coli (ETEC) NOT DETECTED NOT DETECTED Final   Shiga like toxin producing E coli (STEC) NOT DETECTED NOT DETECTED Final   Shigella/Enteroinvasive E coli (EIEC) NOT DETECTED NOT DETECTED Final   Cryptosporidium NOT DETECTED NOT DETECTED Final   Cyclospora cayetanensis NOT DETECTED NOT DETECTED Final   Entamoeba histolytica NOT DETECTED NOT DETECTED Final   Giardia lamblia NOT DETECTED NOT DETECTED Final   Adenovirus F40/41 NOT DETECTED NOT DETECTED Final   Astrovirus NOT DETECTED NOT DETECTED Final   Norovirus GI/GII DETECTED (A) NOT DETECTED Final    Comment: RESULT CALLED TO, READ BACK BY AND VERIFIED WITH: TIFFANY ROBERTS AT 1827 ON 11/14/2017 JJB    Rotavirus A NOT DETECTED NOT DETECTED Final   Sapovirus (I, II, IV, and V) NOT DETECTED NOT DETECTED Final    Comment:  Performed at [facility], [address] [place], [place] [address]  Culture, blood (Routine X 2) w Reflex to ID Panel     Status: None (Preliminary result)   Collection Time: 11/06/17  6:49 AM  Result Value Ref Range Status   Specimen Description BLOOD LEFT HAND  Final   Special Requests   Final    BOTTLES DRAWN AEROBIC ONLY Blood Culture adequate volume   Culture   Final    NO GROWTH 1 DAY Performed at [facility], [address] [place], [place] [address]    Report Status PENDING  Incomplete  Culture, blood (Routine X 2) w Reflex to ID Panel     Status: None (Preliminary result)   Collection Time: 11/06/17  7:56 AM  Result Value Ref Range Status   Specimen Description BLOOD LEFT ARM  Final   Special Requests   Final    BOTTLES DRAWN AEROBIC AND ANAEROBIC Blood Culture adequate volume   Culture   Final    NO GROWTH 1 DAY Performed at [facility], [address], [place], [place] [address]    Report Status PENDING  Incomplete    Radiology Studies: Ct Abdomen Pelvis Wo Contrast  Result Date: 11/05/2017 CLINICAL DATA:  Abdominal distention and weakness. EXAM: CT ABDOMEN AND PELVIS WITHOUT CONTRAST TECHNIQUE: Multidetector CT imaging of the abdomen and pelvis was performed following the standard protocol without IV contrast. COMPARISON:  08/30/2016 and 03/10/2010 CT, abdomen radiographs from 11/05/2017. FINDINGS: Lower chest: Small bilateral pleural effusions right greater than left with cardiomegaly. Small hiatal hernia is noted. Hepatobiliary: Status post cholecystectomy. Small subcapsular calcification in the right hepatic lobe likely representing is tiny granuloma. A 12 mm water attenuating cyst or hemangioma along the anterior aspect of the left hepatic lobe is redemonstrated and is unchanged. No biliary dilatation. Pancreas: Atrophic pancreas without ductal dilatation, mass or inflammation. Spleen: Normal size spleen without mass. Adrenals/Urinary Tract: Stable appearance  of the adrenal glands. Stable nodular thickening of the right adrenal apex unchanged in appearance measuring 15 x 11 mm dating back to 2011 consistent with a benign finding. Punctate hyperdensities within the interpolar aspect of the right kidney likely represent vascular calcifications. No hydroureteronephrosis. The urinary bladder is nondistended and demonstrates no focal mural thickening or calculus. Stomach/Bowel: There is a small stable hiatal hernia. Fluid-filled distention of the stomach is noted without mural thickening. Normal small bowel rotation is identified. Fluid-filled distention of small bowel loops are identified from the third portion of the duodenum through jejunum to the level of the distal ileum measuring up to 2.8 cm. Streaky bandlike adhesions are noted in the right hemipelvis likely contributing to this obstructive bowel gas pattern. Liquid stool is seen within colon consistent with diarrheal disease. No mural thickening is identified. Normal appearance of the appendix. Vascular/Lymphatic: Aortoiliac atherosclerosis without aneurysm. No lymphadenopathy. Reproductive: Status post hysterectomy.  No adnexal mass. Other: Small amount of ascites adjacent to the liver. Musculoskeletal: L1-2 marked degenerative disc disease. No acute nor suspicious osseous abnormality. Multilevel degenerative lumbar facet arthropathy from at least L3 through S1. Mild soft tissue anasarca is noted. IMPRESSION: 1. Abnormal fluid-filled distention of small intestine from jejunum to distal ileum with transition point in the right lower quadrant likely related to postoperative adhesions. This appears to be contributing to at least a high-grade partial SBO or early SBO. Fluid-filled large bowel loops are noted without obstruction compatible with diarrheal disease. 2. Status post hysterectomy.  No adnexal mass. 3. Small hiatal hernia. 4.  Lumbar degenerative facet arthropathy. Degenerative disc disease L1-2. Electronically  Signed   By: Tollie Eth M.D.   On: 11/05/2017 18:27   Dg Chest 2 View  Result Date: 11/05/2017 CLINICAL DATA:  Leukocytosis.  Patient admitted for gastroenteritis. EXAM: CHEST - 2 VIEW COMPARISON:  10/31/2017 FINDINGS: Low lung volumes with crowding of interstitial lung markings and mild pulmonary vascular congestion. Small posterior pleural effusions. Stable cardiomegaly with aortic atherosclerosis. Loop recording device projects over the left heart border. No acute pulmonary consolidation, effusion or pneumothorax. Remote bilateral rib fractures. No acute osseous abnormality. IMPRESSION: Low lung volumes with stable cardiomegaly and aortic atherosclerosis. Mild pulmonary vascular congestion. Small posterior pleural effusions are noted. Electronically Signed   By: Tollie Eth M.D.   On: 11/05/2017 18:09   Dg Chest Port 1 View  Result Date: 11/06/2017 CLINICAL DATA:  Aspiration, rattling in the throat and chest. EXAM: PORTABLE CHEST 1 VIEW COMPARISON:  Chest x-ray of November 05, 2017 FINDINGS: The images are limited due to the patient's body habitus. The lungs are mildly hypoinflated which is accentuated by the lordotic positioning. There is no focal infiltrate. The interstitial markings are coarse however in the right infrahilar region. There is no definite pleural effusion. The cardiac silhouette is enlarged. The pulmonary vascularity is not engorged. IMPRESSION: The study is limited due to technical factors related to scatter effects. There may be atelectasis or infiltrate in the right lower lobe. Cardiomegaly without pulmonary vascular congestion. Electronically Signed   By: David  [place] M.D.   On: 11/06/2017 11:26   Dg Chest Port 1v Same Day  Result Date: 11/06/2017 CLINICAL DATA:  Nasogastric tube placement EXAM: PORTABLE CHEST 1 VIEW COMPARISON:  Portable exam 1444 hours compared to 1107 hours FINDINGS: Nasogastric tube extends into abdomen. Enlargement of cardiac silhouette with pulmonary  vascular congestion. Loop recorder projects over lower chest. Minimal RIGHT basilar atelectasis. Lungs otherwise grossly clear. Bones demineralized. IMPRESSION: Nasogastric tube extends into abdomen. Enlargement of cardiac silhouette with pulmonary vascular congestion and minimal RIGHT basilar atelectasis. Electronically Signed   By: Ulyses Southward M.D.   On: 11/06/2017 15:02   Dg Abd Portable 1v  Result Date: 11/06/2017 CLINICAL DATA:  Nasogastric tube placement EXAM: PORTABLE ABDOMEN - 1 VIEW COMPARISON:  Portable exam 1443 hours compared to CT abdomen and pelvis 11/05/2017 FINDINGS: Abdomen rotated to the RIGHT. Nasogastric tube extends through stomach with tip roughly projecting over pyloric region versus duodenal bulb. Air-filled loops of large and small bowel in upper abdomen. Heart appears enlarged with loop recorder noted. Lung bases clear. IMPRESSION: Tip of nasogastric tube projects roughly over pylorus or duodenal bulb on a rotated exam. Electronically Signed   By: Ulyses Southward M.D.   On: 11/06/2017 15:01    Scheduled Meds: . atorvastatin  20 mg Oral q1800  . baclofen  5 mg Oral BID  . clopidogrel  75 mg Oral Daily  . [START ON 11/11/2017] cyanocobalamin  1,000 mcg Intramuscular Q30 days  . heparin  5,000 Units Subcutaneous Q8H  . insulin aspart  0-5 Units Subcutaneous QHS  . insulin aspart  0-9 Units Subcutaneous TID WC  . insulin glargine  15 Units Subcutaneous QHS  . levothyroxine  125 mcg Oral QAC breakfast  . lidocaine  1 application Topical Once  . mouth rinse  15 mL Mouth Rinse BID  . metoprolol succinate  50 mg Oral Daily  . pantoprazole (PROTONIX) IV  40 mg Intravenous Q24H   Continuous Infusions: . cefTRIAXone (ROCEPHIN)  IV Stopped (  11/07/17 0731)  . metronidazole Stopped (11/07/17 1041)  . potassium chloride    .  sodium bicarbonate  infusion 1000 mL 50 mL/hr at 11/07/17 0940     LOS: 3 days    Critical Care Time spent: 32 mins  Standley Dakins, MD [facility] Pager [phone]  If 7PM-7AM, please contact night-coverage [email or web address] Password [facility] 11/07/2017, 3:17 PM

## 2017-11-07 NOTE — Care Management Note (Signed)
Case Management Note  Patient Details  Name: Dawn Ho MRN: [id number] Date of Birth: Jan 11, 1951  If discussed at Long Length of Stay Meetings, dates discussed:  11/07/2017  Additional Comments:   Merlie Noga, Chrystine OilerSharley Diane, RN 11/07/2017, 12:39 PM

## 2017-11-07 NOTE — Progress Notes (Signed)
Subjective:  Patient with NGT. Appears more comfortable today. [place] thick secretions noted on her hand mit. Per nursing, she will cough up thick Bess[place] mucous when they perform her oral care.   Objective: Vital signs in last 24 hours: Temp:  [98.1 F (36.7 C)-99.5 F (37.5 C)] 98.4 F (36.9 C) (03/21 0727) Pulse Rate:  [92-127] 93 (03/21 0730) Resp:  [18-28] 20 (03/21 0730) BP: (138-175)/(73-105) 138/74 (03/21 0730) SpO2:  [85 %-96 %] 93 % (03/21 0730) Weight:  [218 lb 14.7 oz (99.3 kg)-220 lb 3.8 oz (99.9 kg)] 220 lb 3.8 oz (99.9 kg) (03/21 0600) Last BM Date: 11/05/17 General:   Alert,  Well-developed, well-nourished, pleasant and cooperative in NAD. NGT with bilious drainage.  Head:  Normocephalic and atraumatic. Eyes:  Sclera clear, no icterus.  Abdomen:  Soft, slightly distended and more full upper abd. nontender. Hypoactive bowel sounds, without guarding, and without rebound.   Extremities:  Without clubbing, deformity or edema. Neurologic:  Alert and  oriented to person, place. Much more clear today.  Skin:  Intact without significant lesions or rashes. Psych:  Alert and cooperative. Normal mood and affect.  Intake/Output from previous day: 03/20 0701 - 03/21 0700 In: 550 [I.V.:150; IV Piggyback:400] Out: 600 [Emesis/NG output:600] Intake/Output this shift: Total I/O In: 100 [IV Piggyback:100] Out: 150 [Emesis/NG output:150]  Lab Results: CBC Recent Labs    11/05/17 0415 11/06/17 0528 11/07/17 0838  WBC 18.7* 21.0* 19.5*  HGB 13.3 13.8 14.1  HCT 41.5 43.8 44.2  MCV 88.9 89.8 90.9  PLT 307 298 247   BMET Recent Labs    11/05/17 0415 11/06/17 0528 11/07/17 0838  NA 143 142 143  K 3.1* 3.3* 3.0*  CL 117* 117* 114*  CO2 15* 12* 16*  GLUCOSE 119* 133* 124*  BUN 13 18 15   CREATININE 0.98 1.00 0.99  CALCIUM 8.5* 8.5* 8.6*   LFTs Recent Labs    11/06/17 0528 11/07/17 0838  BILITOT 1.0 0.9  ALKPHOS 65 60  AST 35 33  ALT 25 26  PROT 6.0* 6.2*   ALBUMIN 2.9* 2.9*   No results for input(s): LIPASE in the last 72 hours. PT/INR No results for input(s): LABPROT, INR in the last 72 hours.    Imaging Studies: Ct Abdomen Pelvis Wo Contrast  Result Date: 11/05/2017 CLINICAL DATA:  Abdominal distention and weakness. EXAM: CT ABDOMEN AND PELVIS WITHOUT CONTRAST TECHNIQUE: Multidetector CT imaging of the abdomen and pelvis was performed following the standard protocol without IV contrast. COMPARISON:  08/30/2016 and 03/10/2010 CT, abdomen radiographs from 11/05/2017. FINDINGS: Lower chest: Small bilateral pleural effusions right greater than left with cardiomegaly. Small hiatal hernia is noted. Hepatobiliary: Status post cholecystectomy. Small subcapsular calcification in the right hepatic lobe likely representing is tiny granuloma. A 12 mm water attenuating cyst or hemangioma along the anterior aspect of the left hepatic lobe is redemonstrated and is unchanged. No biliary dilatation. Pancreas: Atrophic pancreas without ductal dilatation, mass or inflammation. Spleen: Normal size spleen without mass. Adrenals/Urinary Tract: Stable appearance of the adrenal glands. Stable nodular thickening of the right adrenal apex unchanged in appearance measuring 15 x 11 mm dating back to 2011 consistent with a benign finding. Punctate hyperdensities within the interpolar aspect of the right kidney likely represent vascular calcifications. No hydroureteronephrosis. The urinary bladder is nondistended and demonstrates no focal mural thickening or calculus. Stomach/Bowel: There is a small stable hiatal hernia. Fluid-filled distention of the stomach is noted without mural thickening. Normal small bowel rotation is identified.  Fluid-filled distention of small bowel loops are identified from the third portion of the duodenum through jejunum to the level of the distal ileum measuring up to 2.8 cm. Streaky bandlike adhesions are noted in the right hemipelvis likely contributing  to this obstructive bowel gas pattern. Liquid stool is seen within colon consistent with diarrheal disease. No mural thickening is identified. Normal appearance of the appendix. Vascular/Lymphatic: Aortoiliac atherosclerosis without aneurysm. No lymphadenopathy. Reproductive: Status post hysterectomy.  No adnexal mass. Other: Small amount of ascites adjacent to the liver. Musculoskeletal: L1-2 marked degenerative disc disease. No acute nor suspicious osseous abnormality. Multilevel degenerative lumbar facet arthropathy from at least L3 through S1. Mild soft tissue anasarca is noted. IMPRESSION: 1. Abnormal fluid-filled distention of small intestine from jejunum to distal ileum with transition point in the right lower quadrant likely related to postoperative adhesions. This appears to be contributing to at least a high-grade partial SBO or early SBO. Fluid-filled large bowel loops are noted without obstruction compatible with diarrheal disease. 2. Status post hysterectomy.  No adnexal mass. 3. Small hiatal hernia. 4. Lumbar degenerative facet arthropathy. Degenerative disc disease L1-2. Electronically Signed   By: Tollie Eth M.D.   On: 11/05/2017 18:27   Dg Chest 2 View  Result Date: 11/05/2017 CLINICAL DATA:  Leukocytosis.  Patient admitted for gastroenteritis. EXAM: CHEST - 2 VIEW COMPARISON:  10/31/2017 FINDINGS: Low lung volumes with crowding of interstitial lung markings and mild pulmonary vascular congestion. Small posterior pleural effusions. Stable cardiomegaly with aortic atherosclerosis. Loop recording device projects over the left heart border. No acute pulmonary consolidation, effusion or pneumothorax. Remote bilateral rib fractures. No acute osseous abnormality. IMPRESSION: Low lung volumes with stable cardiomegaly and aortic atherosclerosis. Mild pulmonary vascular congestion. Small posterior pleural effusions are noted. Electronically Signed   By: Tollie Eth M.D.   On: 11/05/2017 18:09   Dg Abd  1 View  Result Date: 11/05/2017 CLINICAL DATA:  Abdominal distension EXAM: ABDOMEN - 1 VIEW COMPARISON:  CT abdomen 08/30/2016 FINDINGS: There is significant diffuse small bowel dilatation. There is gaseous distention of the colon. There is no evidence of pneumoperitoneum, portal venous gas or pneumatosis. There are no pathologic calcifications along the expected course of the ureters. The osseous structures are unremarkable. IMPRESSION: Gaseous distension of the small bowel and to lesser extent the colon, as can be seen with an ileus versus distal bowel obstruction. Electronically Signed   By: Elige Ko   On: 11/05/2017 10:09   Ct Head Wo Contrast  Result Date: 10/31/2017 CLINICAL DATA:  Altered level of consciousness, slurring words EXAM: CT HEAD WITHOUT CONTRAST TECHNIQUE: Contiguous axial images were obtained from the base of the skull through the vertex without intravenous contrast. COMPARISON:  CT brain scan of 08/30/2016 FINDINGS: Brain: The ventricular system remains prominent as do the cortical sulci, indicative of diffuse atrophy. The septum is midline in position. Significant small vessel ischemic change is noted throughout the periventricular white matter and bilateral old lacunar infarcts are noted. No definite acute interval change is seen. No hemorrhage is noted and there is no evidence of mass effect. Vascular: No vascular abnormality is seen on this unenhanced study. Skull: On bone window images, no calvarial abnormality is seen Sinuses/Orbits: .  The paranasal sinuses appear well pneumatized. Other: None. IMPRESSION: 1. Stable diffuse atrophy and significant small vessel ischemic change throughout the periventricular white matter. 2. Stable old lacunar infarcts bilaterally. No definite acute process. Electronically Signed   By: Lucienne Minks.D.  On: 10/31/2017 10:10   Mr Brain Wo Contrast  Result Date: 10/31/2017 CLINICAL DATA:  67 year old female with 2 days of altered mental status.  No known injury. EXAM: MRI HEAD WITHOUT CONTRAST TECHNIQUE: Multiplanar, multiecho pulse sequences of the brain and surrounding structures were obtained without intravenous contrast. COMPARISON:  Head CT without contrast 0955 hours today. Brain MRI 11/17/2014. FINDINGS: Brain: No restricted diffusion or evidence of acute infarction. Chronic infarcts in the left basal ganglia, left thalamus, bilateral corona radiata, and right middle frontal gyrus with encephalomalacia and some hemosiderin are stable since 2016. Wallerian degeneration has developed in the right cerebral peduncle since that time. Additional widespread patchy cerebral white matter T2 and FLAIR hyperintensity has not significantly changed since 2016. Small lacunar infarcts in the right pons are stable to increased since the prior MRI, but chronic. The cerebellum remains normal. No midline shift, mass effect, evidence of mass lesion, ventriculomegaly, extra-axial collection or acute intracranial hemorrhage. Cervicomedullary junction and pituitary are within normal limits. Vascular: Major intracranial vascular flow voids are stable since 2016. Skull and upper cervical spine: Negative visible cervical spine. Normal bone marrow signal. Sinuses/Orbits: Interval postoperative changes to the left globe. Otherwise negative orbits soft tissues. Paranasal sinuses and mastoids are stable and well pneumatized. Other: Scalp and face soft tissues appear negative. IMPRESSION: 1.  No acute intracranial abnormality. 2. Advanced chronic ischemic disease without significant progression since the 2016 MRI. Wallerian degeneration has developed along the right cerebral peduncle. Electronically Signed   By: Odessa Fleming M.D.   On: 10/31/2017 11:20   Dg Chest Port 1 View  Result Date: 11/06/2017 CLINICAL DATA:  Aspiration, rattling in the throat and chest. EXAM: PORTABLE CHEST 1 VIEW COMPARISON:  Chest x-ray of November 05, 2017 FINDINGS: The images are limited due to the  patient's body habitus. The lungs are mildly hypoinflated which is accentuated by the lordotic positioning. There is no focal infiltrate. The interstitial markings are coarse however in the right infrahilar region. There is no definite pleural effusion. The cardiac silhouette is enlarged. The pulmonary vascularity is not engorged. IMPRESSION: The study is limited due to technical factors related to scatter effects. There may be atelectasis or infiltrate in the right lower lobe. Cardiomegaly without pulmonary vascular congestion. Electronically Signed   By: David  [place]azil[place] M.D.   On: 11/06/2017 11:26   Dg Chest Portable 1 View  Result Date: 10/31/2017 CLINICAL DATA:  Weakness.  Diabetes. EXAM: PORTABLE CHEST 1 VIEW COMPARISON:  CT 11/19/2014. FINDINGS: Mediastinum hilar structures are normal. Cardiomegaly with normal pulmonary vascularity. Low lung volumes with mild basilar atelectasis. Tiny rounded radiopacity noted projected over lower anterior rib is unchanged and is most likely a bone island. IMPRESSION: 1.  Cardiomegaly.  No pulmonary venous congestion. 2. Low lung volumes with mild basilar atelectasis. No acute infiltrate. Electronically Signed   By: Maisie Fus  Register   On: 10/31/2017 09:27   Dg Chest Port 1v Same Day  Result Date: 11/06/2017 CLINICAL DATA:  Nasogastric tube placement EXAM: PORTABLE CHEST 1 VIEW COMPARISON:  Portable exam 1444 hours compared to 1107 hours FINDINGS: Nasogastric tube extends into abdomen. Enlargement of cardiac silhouette with pulmonary vascular congestion. Loop recorder projects over lower chest. Minimal RIGHT basilar atelectasis. Lungs otherwise grossly clear. Bones demineralized. IMPRESSION: Nasogastric tube extends into abdomen. Enlargement of cardiac silhouette with pulmonary vascular congestion and minimal RIGHT basilar atelectasis. Electronically Signed   By: Ulyses Southward M.D.   On: 11/06/2017 15:02   Dg Abd Portable 1v  Result Date:  11/06/2017 CLINICAL DATA:   Nasogastric tube placement EXAM: PORTABLE ABDOMEN - 1 VIEW COMPARISON:  Portable exam 1443 hours compared to CT abdomen and pelvis 11/05/2017 FINDINGS: Abdomen rotated to the RIGHT. Nasogastric tube extends through stomach with tip roughly projecting over pyloric region versus duodenal bulb. Air-filled loops of large and small bowel in upper abdomen. Heart appears enlarged with loop recorder noted. Lung bases clear. IMPRESSION: Tip of nasogastric tube projects roughly over pylorus or duodenal bulb on a rotated exam. Electronically Signed   By: Ulyses Southward M.D.   On: 11/06/2017 15:01  [2 weeks]   Assessment: 67 year old female presenting from [facility] with altered mental status, vomiting, and loose stool, found to have acute renal injury and possible UTI. Initially, she improved from a GI standpoint with supportive measures; however, Cdiff was checked due to recurrent diarrhea while admitted, which was negative. She was positive for Norovirus on GI pathogen panel. She has had couple of small loose stools overnight.   Two days ago with increased abd distention and abd pain, vomiting. KUB and then CT ordered with concern for high grade partial SBO vs early SBO.  surgery on board. She had NGT placed yesterday after she started having copious emesis and respiratory distress, possible aspiration.   WBC slightly improved today. Now with hypoactive bowel sounds, less tympany. Her breathing has improved. Blood cultures pending. CXRs noted from yesterday with minimal right basilar atelectasis.      At some point, needs outpatient routine screening colonoscopy.  Plan: 1. Management of partial SBO per surgery.  2. Continue supportive measures.   Leanna Battles. Dixon Boos [facility] [phone] 3/21/20199:41 AM     LOS: 3 days

## 2017-11-08 DIAGNOSIS — N3000 Acute cystitis without hematuria: Secondary | ICD-10-CM

## 2017-11-08 LAB — BLOOD CULTURE ID PANEL (REFLEXED)

## 2017-11-08 LAB — GLUCOSE, CAPILLARY
GLUCOSE-CAPILLARY: 108 mg/dL — AB (ref 65–99)
GLUCOSE-CAPILLARY: 119 mg/dL — AB (ref 65–99)
GLUCOSE-CAPILLARY: 138 mg/dL — AB (ref 65–99)
Glucose-Capillary: 129 mg/dL — ABNORMAL HIGH (ref 65–99)

## 2017-11-08 MED ORDER — SODIUM BICARBONATE 8.4 % IV SOLN
INTRAVENOUS | Status: DC
  Administered 2017-11-08: 11:00:00 via INTRAVENOUS
  Filled 2017-11-08 (×4): qty 1000

## 2017-11-08 MED ORDER — SODIUM CHLORIDE 0.9 % IV SOLN
2.0000 g | INTRAVENOUS | Status: DC
  Administered 2017-11-09 – 2017-11-10 (×2): 2 g via INTRAVENOUS
  Filled 2017-11-08: qty 2
  Filled 2017-11-08: qty 20
  Filled 2017-11-08: qty 2

## 2017-11-08 NOTE — Progress Notes (Signed)
Subjective: Today she states she feels better.  Is oriented to person, place, time.  Denies ongoing abdominal pain.  Denies nausea and vomiting.  No other GI complaints.  She seems to be intermittently pleasantly confused asking if she would have to move to another room.  Per nursing staff she occasionally thinks she is in her sister's trailer "but you call at the hospital." No other GI complaints.  Objective: Vital signs in last 24 hours: Temp:  [98.1 F (36.7 C)-98.9 F (37.2 C)] 98.7 F (37.1 C) (03/22 0400) Pulse Rate:  [94-101] 94 (03/22 0600) Resp:  [18-23] 19 (03/22 0600) BP: (133-172)/(77-90) 172/84 (03/22 0600) SpO2:  [86 %-94 %] 94 % (03/22 0600) Weight:  [221 lb 9 oz (100.5 kg)] 221 lb 9 oz (100.5 kg) (03/22 0500) Last BM Date: 11/05/17 General:   Alert and oriented (person, place, president), pleasant Head:  Normocephalic and atraumatic. Eyes:  No icterus, sclera clear. Conjuctiva pink.  Heart:  S1, S2 present, no murmurs noted.  Lungs: Clear to auscultation bilaterally, without wheezing, rales, or rhonchi.  Abdomen:  Bowel sounds hypoactive, somewhat distended but nontender. No HSM or hernias noted. No rebound or guarding. No masses appreciated  Pulses:  Normal bilateral DP pulses noted. Extremities:  Without clubbing or edema. Neurologic:  Alert and  oriented x4;  grossly normal neurologically. Skin:  Warm and dry, intact without significant lesions.  Psych:  Alert and cooperative. Normal mood and affect.  Intake/Output from previous day: 03/21 0701 - 03/22 0700 In: 2700 [I.V.:1800; IV Piggyback:900] Out: 1050 [Urine:300; Emesis/NG output:750] Intake/Output this shift: No intake/output data recorded.  Lab Results: Recent Labs    11/06/17 0528 11/07/17 0838  WBC 21.0* 19.5*  HGB 13.8 14.1  HCT 43.8 44.2  PLT 298 247   BMET Recent Labs    11/06/17 0528 11/07/17 0838  NA 142 143  K 3.3* 3.0*  CL 117* 114*  CO2 12* 16*  GLUCOSE 133* 124*  BUN 18  15  CREATININE 1.00 0.99  CALCIUM 8.5* 8.6*   LFT Recent Labs    11/06/17 0528 11/07/17 0838  PROT 6.0* 6.2*  ALBUMIN 2.9* 2.9*  AST 35 33  ALT 25 26  ALKPHOS 65 60  BILITOT 1.0 0.9   PT/INR No results for input(s): LABPROT, INR in the last 72 hours. Hepatitis Panel No results for input(s): HEPBSAG, HCVAB, HEPAIGM, HEPBIGM in the last 72 hours.   Studies/Results: Dg Chest Port 1 View  Result Date: 11/06/2017 CLINICAL DATA:  Aspiration, rattling in the throat and chest. EXAM: PORTABLE CHEST 1 VIEW COMPARISON:  Chest x-ray of November 05, 2017 FINDINGS: The images are limited due to the patient's body habitus. The lungs are mildly hypoinflated which is accentuated by the lordotic positioning. There is no focal infiltrate. The interstitial markings are coarse however in the right infrahilar region. There is no definite pleural effusion. The cardiac silhouette is enlarged. The pulmonary vascularity is not engorged. IMPRESSION: The study is limited due to technical factors related to scatter effects. There may be atelectasis or infiltrate in the right lower lobe. Cardiomegaly without pulmonary vascular congestion. Electronically Signed   By: David  [place] M.D.   On: 11/06/2017 11:26   Dg Chest Port 1v Same Day  Result Date: 11/06/2017 CLINICAL DATA:  Nasogastric tube placement EXAM: PORTABLE CHEST 1 VIEW COMPARISON:  Portable exam 1444 hours compared to 1107 hours FINDINGS: Nasogastric tube extends into abdomen. Enlargement of cardiac silhouette with pulmonary vascular congestion. Loop recorder projects  over lower chest. Minimal RIGHT basilar atelectasis. Lungs otherwise grossly clear. Bones demineralized. IMPRESSION: Nasogastric tube extends into abdomen. Enlargement of cardiac silhouette with pulmonary vascular congestion and minimal RIGHT basilar atelectasis. Electronically Signed   By: Ulyses SouthwardMark  Boles M.D.   On: 11/06/2017 15:02   Dg Abd Portable 1v  Result Date: 11/06/2017 CLINICAL DATA:   Nasogastric tube placement EXAM: PORTABLE ABDOMEN - 1 VIEW COMPARISON:  Portable exam 1443 hours compared to CT abdomen and pelvis 11/05/2017 FINDINGS: Abdomen rotated to the RIGHT. Nasogastric tube extends through stomach with tip roughly projecting over pyloric region versus duodenal bulb. Air-filled loops of large and small bowel in upper abdomen. Heart appears enlarged with loop recorder noted. Lung bases clear. IMPRESSION: Tip of nasogastric tube projects roughly over pylorus or duodenal bulb on a rotated exam. Electronically Signed   By: Ulyses SouthwardMark  Boles M.D.   On: 11/06/2017 15:01    Assessment: 67 [facility], vomiting, and loose stool, found to have acute renal injury and possible UTI. Initially, she improved from a GI standpoint with supportive measures; however, Cdiff was checked due to recurrent diarrhea while admitted, which was negative. She was positive for Norovirus on GI pathogen panel. She has had couple of small loose stools overnight.   Three days ago with increased abd distention and abd pain, vomiting. KUB and then CT ordered with concern for high grade partial SBO vs early SBO.surgery on board. She had NGT placed yesterday after she started having copious emesis and respiratory distress, possible aspiration. She was transferred to ICU/stepdown.  WBC slightly improved yesterday and with hypoactive bowel sounds, less tympany. Her breathing improved. CXRs noted from two days ago with minimal right basilar atelectasis.    At some point, needs outpatient routine screening colonoscopy.  Blood cultures with no growth x 1 day. CBC yesterday with slight improvement in WBC to 19.5, H/H normal. CMP with improved electrolytes, Cr normal.  Today she seems clinically improved.  Nursing states the night nurse told her that minimal output was in the canister from her NG tube.  However, her suction was only at 25-50.  The day nurse  increase this to 125 intermittent suction and she is now put out 700 cc of bilious looking fluid.  She is on 6 L oxygen satting 90-93%.  She states overall that she feels better.  She is mostly oriented.  Nursing states she has bouts of confusion.   Plan: 1. Keep NGT in place for now (pending surgical rounds) 2. Supportive measures 3. We will follow peripherally   Thank you for allowing us to participate in the care of Dawn Ho  Eric Gill, DNP, AGNP-C Adult & Gerontological Nurse Practitioner [facility]    LOS: 4 days    11/08/2017, 8:29 AM

## 2017-11-08 NOTE — Progress Notes (Signed)
PROGRESS NOTE    RADIANCE DEVOTO  VHQ:[id number] DOB: 04/17/1951 DOA: 10/31/2017 PCP: Bernerd Limbo, MD    Brief Narrative:  67 year old female with a history of previous right MCA stroke, hypertension, diabetes and diastolic heart failure, who is a resident of a nursing facility, admitted to the hospital with altered mental status as well as GI issues including vomiting and diarrhea.  Urinalysis indicated E. coli UTI and she was treated with intravenous antibiotics.  She also had acute kidney injury that was treated with IV fluids.  She developed persistent vomiting throughout hospital stay and imaging showed partial small bowel obstruction versus ileus.  NG tube was placed for decompression and general surgery following.   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 Ileus versus partial small bowel obstruction.  Patient was having persistent vomiting of bilious material.  NG tube placed for decompression with symptomatic improvement.  General surgery following.  NG tube has been put to gravity at this time.  She has any recurrence of symptoms, which took a back up to suction. 2. Diarrhea.  Related to Arna Medici virus.  Treating supportively.  Clinically appears to be improving. 3. Leukocytosis.  Suspect this is related to dehydration/UTI/aspiration.  Recheck in a.m. 4. E. coli urinary tract infection.  Treated with ceftriaxone. 5. Metabolic encephalopathy due to dehydration.  Appears to be improving.  Continue current treatments. 6. Acute kidney injury.  Related to dehydration.  Resolved. 7. Positive blood cultures.  Patient has 1 out of 2 positive blood cultures for gram-positive  cocci.  Suspect this is contamination.  Continue to follow-up sensitivities. 8. Aspiration.  Patient had frequent vomiting and has been having evidence of aspiration.  Currently on IV antibiotics.  Continue to monitor. 9. History of stroke with left-sided hemiparesis.  Continue supportive care, Plavix and statin. 10. Chronic diastolic congestive heart failure.  Appears compensated at this time.  Continue to manage. 11. Metabolic acidosis.  Possibly related to GI losses.  Currently on bicarbonate infusion. 12. Diabetes.  Blood sugars have been stable.  On Lantus and sliding scale insulin.  Continue current treatments. 13. Hypokalemia.  Related to diminished oral intake.  Replace them.  Magnesium stable.  Continue to monitor.   DVT prophylaxis: Heparin Code Status: Full code Family Communication: No family present Disposition Plan: Return to nursing facility on discharge   Consultants:   Gastroenterology  General surgery  Procedures:     Antimicrobials:   Rocephin 3/14 > 3/17, 3/20 >   Subjective: Feels better after NG tube is in place.  Feels generally weak.  No vomiting.  Reports having bowel movements.  Objective: Vitals:   11/08/17 1300 11/08/17 1400 11/08/17 1500 11/08/17 1600  BP: (!) 170/77 (!) 154/73 (!) 169/78   Pulse: 92 92 94   Resp: 16 20 (!) 21   Temp:    98.6 F (37 C)  TempSrc:    Oral  SpO2: 93% 97% 95%   Weight:      Height:        Intake/Output Summary (Last 24 hours) at 11/08/2017 1816 Last data filed at 11/08/2017 1733 Gross per 24 hour  Intake 1719.17  ml  Output 2100 ml  Net -380.83 ml   Filed Weights   11/06/17 1544 11/07/17 0600 11/08/17 0500  Weight: 99.3 kg (218 lb 14.7 oz) 99.9 kg (220 lb 3.8 oz) 100.5 kg (221 lb 9 oz)    Examination:  General exam: Appears calm and comfortable, NG tube in place Respiratory system: Clear to auscultation. Respiratory effort normal. Cardiovascular system: S1 & S2 heard, RRR. No JVD, murmurs, rubs,  gallops or clicks. No pedal edema. Gastrointestinal system: Abdomen is distended, soft and nontender. No organomegaly or masses felt.  Bowel sounds are sluggish. Central nervous system: No focal neurological deficits. Extremities: Symmetric 5 x 5 power. Skin: No rashes, lesions or ulcers Psychiatry: Confused, pleasant    Data Reviewed: I have personally reviewed following labs and imaging studies  CBC: Recent Labs  Lab 11/03/17 0559 11/05/17 0415 11/06/17 0528 11/07/17 0838  WBC 12.3* 18.7* 21.0* 19.5*  NEUTROABS  --  14.0 17.0* 15.6*  HGB 12.6 13.3 13.8 14.1  HCT 39.5 41.5 43.8 44.2  MCV 88.2 88.9 89.8 90.9  PLT 281 307 298 247   Basic Metabolic Panel: Recent Labs  Lab 11/02/17 1652 11/03/17 0559 11/04/17 0531 11/05/17 0415 11/06/17 0528 11/07/17 0838  NA  --  151* 144 143 142 143  K  --  2.4* 2.7* 3.1* 3.3* 3.0*  CL  --  121* 117* 117* 117* 114*  CO2  --  18* 17* 15* 12* 16*  GLUCOSE  --  163* 96 119* 133* 124*  BUN  --  12 11 13 18 15   CREATININE  --  0.93 0.95 0.98 1.00 0.99  CALCIUM  --  9.0 8.7* 8.5* 8.5* 8.6*  MG 1.9  --  1.7 2.0 2.1 2.0  PHOS  --   --   --   --  3.0 3.0   GFR: Estimated Creatinine Clearance: 58.8 mL/min (by C-G formula based on SCr of 0.99 mg/dL). Liver Function Tests: Recent Labs  Lab 11/06/17 0528 11/07/17 0838  AST 35 33  ALT 25 26  ALKPHOS 65 60  BILITOT 1.0 0.9  PROT 6.0* 6.2*  ALBUMIN 2.9* 2.9*   No results for input(s): LIPASE, AMYLASE in the last 168 hours. No results for input(s): AMMONIA in the last 168 hours. Coagulation Profile: No results for input(s): INR, PROTIME in the last 168 hours. Cardiac Enzymes: No results for input(s): CKTOTAL, CKMB, CKMBINDEX, TROPONINI in the last 168 hours. BNP (last 3 results) No results for input(s): PROBNP in the last 8760 hours. HbA1C: No results for input(s): HGBA1C in the last 72 hours. CBG: Recent Labs  Lab 11/07/17 1606 11/07/17 2107 11/08/17 0830 11/08/17 1238  11/08/17 1616  GLUCAP 128* 112* 108* 119* 129*   Lipid Profile: No results for input(s): CHOL, HDL, LDLCALC, TRIG, CHOLHDL, LDLDIRECT in the last 72 hours. Thyroid Function Tests: No results for input(s): TSH, T4TOTAL, FREET4, T3FREE, THYROIDAB in the last 72 hours. Anemia Panel: No results for input(s): VITAMINB12, FOLATE, FERRITIN, TIBC, IRON, RETICCTPCT in the last 72 hours. Sepsis Labs: Recent Labs  Lab 11/05/17 1204 11/06/17 0900  LATICACIDVEN 0.9 1.0    Recent Results (from the past 240 hour(s))  Urine Culture     Status: Abnormal   Collection Time: 10/31/17 10:14 AM  Result Value Ref Range Status   Specimen Description   Final    URINE, RANDOM Performed at [facility], [address] [place], [place] [address]    Special Requests   Final    NONE  Performed at [facility], [address], [place], [place] [address]    Culture >=100,000 COLONIES/mL ESCHERICHIA COLI (A)  Final   Report Status 11/03/2017 FINAL  Final   Organism ID, Bacteria ESCHERICHIA COLI (A)  Final      Susceptibility   Escherichia coli - MIC*    AMPICILLIN >=32 RESISTANT Resistant     CEFAZOLIN <=4 SENSITIVE Sensitive     CEFTRIAXONE <=1 SENSITIVE Sensitive     CIPROFLOXACIN <=0.25 SENSITIVE Sensitive     GENTAMICIN >=16 RESISTANT Resistant     IMIPENEM <=0.25 SENSITIVE Sensitive     NITROFURANTOIN <=16 SENSITIVE Sensitive     TRIMETH/SULFA <=20 SENSITIVE Sensitive     AMPICILLIN/SULBACTAM 16 INTERMEDIATE Intermediate     PIP/TAZO <=4 SENSITIVE Sensitive     Extended ESBL NEGATIVE Sensitive     * >=100,000 COLONIES/mL ESCHERICHIA COLI  MRSA PCR Screening     Status: Abnormal   Collection Time: 10/31/17  4:22 PM  Result Value Ref Range Status   MRSA by PCR POSITIVE (A) NEGATIVE Final    Comment:        The GeneXpert MRSA Assay (FDA approved for NASAL specimens only), is one component of a comprehensive MRSA colonization surveillance program. It is not intended to diagnose MRSA infection  nor to guide or monitor treatment for MRSA infections. RESULT CALLED TO, READ BACK BY AND VERIFIED WITH: JACKSON,N AT 614PM ON [phone] BY ISLEY,B Performed at [facility], [address] [place], [place] [address]   C difficile quick scan w PCR reflex     Status: None   Collection Time: 11/02/17  6:34 PM  Result Value Ref Range Status   C Diff antigen NEGATIVE NEGATIVE Final   C Diff toxin NEGATIVE NEGATIVE Final   C Diff interpretation No C. difficile detected.  Final    Comment: Performed at [facility], [address] [place], [place] [address]  C difficile quick scan w PCR reflex     Status: None   Collection Time: 11/06/17  3:45 AM  Result Value Ref Range Status   C Diff antigen NEGATIVE NEGATIVE Final   C Diff toxin NEGATIVE NEGATIVE Final   C Diff interpretation No C. difficile detected.  Final    Comment: Performed at [facility], [address] [place], [place] [address]  Gastrointestinal Panel by PCR , Stool     Status: Abnormal   Collection Time: 11/06/17  3:45 AM  Result Value Ref Range Status   Campylobacter species NOT DETECTED NOT DETECTED Final   Plesimonas shigelloides NOT DETECTED NOT DETECTED Final   Salmonella species NOT DETECTED NOT DETECTED Final   Yersinia enterocolitica NOT DETECTED NOT DETECTED Final   Vibrio species NOT DETECTED NOT DETECTED Final   Vibrio cholerae NOT DETECTED NOT DETECTED Final   Enteroaggregative E coli (EAEC) NOT DETECTED NOT DETECTED Final   Enteropathogenic E coli (EPEC) NOT DETECTED NOT DETECTED Final   Enterotoxigenic E coli (ETEC) NOT DETECTED NOT DETECTED Final   Shiga like toxin producing E coli (STEC) NOT DETECTED NOT DETECTED Final   Shigella/Enteroinvasive E coli (EIEC) NOT DETECTED NOT DETECTED Final   Cryptosporidium NOT DETECTED NOT DETECTED Final   Cyclospora cayetanensis NOT DETECTED NOT DETECTED Final   Entamoeba histolytica NOT DETECTED NOT DETECTED Final   Giardia lamblia NOT DETECTED NOT DETECTED Final    Adenovirus F40/41 NOT DETECTED NOT DETECTED Final   Astrovirus NOT DETECTED NOT DETECTED Final   Norovirus GI/GII DETECTED (A) NOT DETECTED Final  Comment: RESULT CALLED TO, READ BACK BY AND VERIFIED WITH: TIFFANY ROBERTS AT 1827 ON 11/14/2017 JJB    Rotavirus A NOT DETECTED NOT DETECTED Final   Sapovirus (I, II, IV, and V) NOT DETECTED NOT DETECTED Final    Comment: Performed at [facility], [address] [place], [place] [address]  Culture, blood (Routine X 2) w Reflex to ID Panel     Status: None (Preliminary result)   Collection Time: 11/06/17  6:49 AM  Result Value Ref Range Status   Specimen Description   Final    BLOOD LEFT HAND Performed at [facility], [address], [place], [place] [address]    Special Requests   Final    BOTTLES DRAWN AEROBIC ONLY Blood Culture adequate volume Performed at [facility], [address], [place], [place] [address]    Culture  Setup Time   Final    GRAM POSITIVE COCCI Gram Stain Report Called to,Read Back By and Verified With: MCDANIEL,M. AT 1720 ON 11/07/2017 BY EVA AEROBIC BOTTLE ONLY Performed at [facility] CRITICAL RESULT CALLED TO, READ BACK BY AND VERIFIED WITH: B. ACORD,RN 0056 11/08/2017 T. TYSOR    Culture   Final    GRAM POSITIVE COCCI CULTURE REINCUBATED FOR BETTER GROWTH Performed at [facility], [address] [place], [place] [address]    Report Status PENDING  Incomplete  Blood Culture ID Panel (Reflexed)     Status: None   Collection Time: 11/06/17  6:49 AM  Result Value Ref Range Status   Enterococcus species NOT DETECTED NOT DETECTED Final   Listeria monocytogenes NOT DETECTED NOT DETECTED Final   Staphylococcus species NOT DETECTED NOT DETECTED Final   Staphylococcus aureus NOT DETECTED NOT DETECTED Final   Streptococcus species NOT DETECTED NOT DETECTED Final   Streptococcus agalactiae NOT DETECTED NOT DETECTED Final   Streptococcus pneumoniae NOT DETECTED NOT DETECTED Final    Streptococcus pyogenes NOT DETECTED NOT DETECTED Final   Acinetobacter baumannii NOT DETECTED NOT DETECTED Final   Enterobacteriaceae species NOT DETECTED NOT DETECTED Final   Enterobacter cloacae complex NOT DETECTED NOT DETECTED Final   Escherichia coli NOT DETECTED NOT DETECTED Final   Klebsiella oxytoca NOT DETECTED NOT DETECTED Final   Klebsiella pneumoniae NOT DETECTED NOT DETECTED Final   Proteus species NOT DETECTED NOT DETECTED Final   Serratia marcescens NOT DETECTED NOT DETECTED Final   Haemophilus influenzae NOT DETECTED NOT DETECTED Final   Neisseria meningitidis NOT DETECTED NOT DETECTED Final   Pseudomonas aeruginosa NOT DETECTED NOT DETECTED Final   Candida albicans NOT DETECTED NOT DETECTED Final   Candida glabrata NOT DETECTED NOT DETECTED Final   Candida krusei NOT DETECTED NOT DETECTED Final   Candida parapsilosis NOT DETECTED NOT DETECTED Final   Candida tropicalis NOT DETECTED NOT DETECTED Final  Culture, blood (Routine X 2) w Reflex to ID Panel     Status: None (Preliminary result)   Collection Time: 11/06/17  7:56 AM  Result Value Ref Range Status   Specimen Description BLOOD LEFT ARM  Final   Special Requests   Final    BOTTLES DRAWN AEROBIC AND ANAEROBIC Blood Culture adequate volume   Culture   Final    NO GROWTH 2 DAYS Performed at [facility], [address], [place], [place] [address]    Report Status PENDING  Incomplete         Radiology Studies: No results found.      Scheduled Meds: . atorvastatin  20  mg Oral q1800  . baclofen  5 mg Oral BID  . clopidogrel  75 mg Oral Daily  . [START ON 11/11/2017] cyanocobalamin  1,000 mcg Intramuscular Q30 days  . heparin  5,000 Units Subcutaneous Q8H  . insulin aspart  0-5 Units Subcutaneous QHS  . insulin aspart  0-9 Units Subcutaneous TID WC  . insulin glargine  15 Units Subcutaneous QHS  . levothyroxine  125 mcg Oral QAC breakfast  . lidocaine  1 application Topical Once  . mouth rinse  15 mL  Mouth Rinse BID  . metoprolol succinate  50 mg Oral Daily  . pantoprazole (PROTONIX) IV  40 mg Intravenous Q24H   Continuous Infusions: . [START ON 11/09/2017] cefTRIAXone (ROCEPHIN)  IV    . dextrose 5 % 1,000 mL with potassium chloride 40 mEq, sodium bicarbonate 150 mEq infusion 50 mL/hr at 11/08/17 1055  . metronidazole 500 mg (11/08/17 1733)     LOS: 4 days    Time spent:    Erick Blinks, MD [facility] Pager [phone]  If 7PM-7AM, please contact night-coverage [email or web address] Password [facility] 11/08/2017, 6:16 PM

## 2017-11-08 NOTE — Progress Notes (Addendum)
[facility]  Doing fair. More awake. No complaints. No abdominal pain. NG with 600 output this shift. Looks bilious. Having some BMs.   BP (!) 169/78   Pulse 94   Temp 98.6 F (37 C) (Oral)   Resp (!) 21   Ht 5' (1.524 m)   Wt 221 lb 9 oz (100.5 kg)   SpO2 95%   BMI 43.27 kg/m  Abdominal soft, distended, nontender  Normal work breathing  No labs today.  NG to gravity overnight. If gets nauseated or vomits, can place back to suction.   Algis GreenhouseLindsay Bridges, MD [facility] [address] [place], [place] [phone] (office)

## 2017-11-09 ENCOUNTER — Inpatient Hospital Stay (HOSPITAL_COMMUNITY): Payer: Medicare Other

## 2017-11-09 LAB — GLUCOSE, CAPILLARY
GLUCOSE-CAPILLARY: 132 mg/dL — AB (ref 65–99)
Glucose-Capillary: 142 mg/dL — ABNORMAL HIGH (ref 65–99)
Glucose-Capillary: 158 mg/dL — ABNORMAL HIGH (ref 65–99)
Glucose-Capillary: 149 mg/dL — ABNORMAL HIGH (ref 65–99)

## 2017-11-09 LAB — BASIC METABOLIC PANEL
Anion gap: 12 (ref 5–15)
BUN: 9 mg/dL (ref 6–20)
CALCIUM: 8.1 mg/dL — AB (ref 8.9–10.3)
CO2: 25 mmol/L (ref 22–32)
CREATININE: 0.83 mg/dL (ref 0.44–1.00)
Chloride: 115 mmol/L — ABNORMAL HIGH (ref 101–111)
GFR calc Af Amer: >60 mL/min (ref >60)
Glucose, Bld: 141 mg/dL — ABNORMAL HIGH (ref 65–99)
POTASSIUM: 1.9 mmol/L — AB (ref 3.5–5.1)
SODIUM: 152 mmol/L — AB (ref 135–145)

## 2017-11-09 LAB — CBC
HCT: 35.8 % — ABNORMAL LOW (ref 36.0–46.0)
Hemoglobin: 11.2 g/dL — ABNORMAL LOW (ref 12.0–15.0)
MCH: 28.3 pg (ref 26.0–34.0)
MCHC: 31.3 g/dL (ref 30.0–36.0)
MCV: 90.4 fL (ref 78.0–100.0)
Platelets: 243 10*3/uL (ref 150–400)
RBC: 3.96 MIL/uL (ref 3.87–5.11)
RDW: 15.8 % — AB (ref 11.5–15.5)
WBC: 13.2 10*3/uL — ABNORMAL HIGH (ref 4.0–10.5)

## 2017-11-09 LAB — POTASSIUM: Potassium: 3 mmol/L — ABNORMAL LOW (ref 3.5–5.1)

## 2017-11-09 MED ORDER — KCL IN DEXTROSE-NACL 40-5-0.45 MEQ/L-%-% IV SOLN
INTRAVENOUS | Status: DC
  Administered 2017-11-09 – 2017-11-10 (×2): via INTRAVENOUS
  Filled 2017-11-09 (×3): qty 1000

## 2017-11-09 MED ORDER — POTASSIUM CHLORIDE 10 MEQ/100ML IV SOLN
10.0000 meq | INTRAVENOUS | Status: AC
  Administered 2017-11-09 (×6): 10 meq via INTRAVENOUS
  Filled 2017-11-09 (×6): qty 100

## 2017-11-09 MED ORDER — POTASSIUM CHLORIDE 2 MEQ/ML IV SOLN
INTRAVENOUS | Status: DC
  Administered 2017-11-09: 10:00:00 via INTRAVENOUS
  Filled 2017-11-09 (×6): qty 1000

## 2017-11-09 MED ORDER — POTASSIUM CHLORIDE 10 MEQ/100ML IV SOLN: 10.0000 meq | INTRAVENOUS | Status: DC

## 2017-11-09 MED ORDER — POTASSIUM CHLORIDE 10 MEQ/100ML IV SOLN
10.0000 meq | INTRAVENOUS | Status: DC
  Administered 2017-11-09: 10 meq via INTRAVENOUS
  Filled 2017-11-09: qty 100

## 2017-11-09 NOTE — Progress Notes (Signed)
Per verbal order and care order instruction, NG tube removed. Less than <100 cc output in over an hour. No complications noted

## 2017-11-09 NOTE — Progress Notes (Signed)
PROGRESS NOTE    Dawn Ho  YQM:[id number] DOB: 01-02-1951 DOA: 10/31/2017 PCP: Bernerd Limbo, MD    Brief Narrative:  67 year old female with a history of previous right MCA stroke, hypertension, diabetes and diastolic heart failure, who is a resident of a nursing facility, admitted to the hospital with altered mental status as well as GI issues including vomiting and diarrhea.  Urinalysis indicated E. coli UTI and she was treated with intravenous antibiotics.  She also had acute kidney injury that was treated with IV fluids.  She developed persistent vomiting throughout hospital stay and imaging showed partial small bowel obstruction versus ileus.  NG tube was placed for decompression and general surgery following.   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 Ileus versus partial small bowel obstruction.  Patient was having persistent vomiting of bilious material.  NG tube placed for decompression with symptomatic improvement.  General surgery following.  NG tube has been clamped.  She is not had any recurrent vomiting, but does have some nausea.  Abdomen is still distended. 2. Diarrhea.  Related to Noro virus.  Treating supportively.  Clinically appears to be improving. 3. Leukocytosis.  Suspect this is related to dehydration/UTI/aspiration.  Trending down. 4. E. coli urinary tract infection.  Treated with ceftriaxone. 5. Metabolic encephalopathy due to dehydration.  Appears to be improving.  Continue current treatments. 6. Acute kidney injury.  Related to dehydration.  Resolved. 7. Positive blood cultures.  Patient has 1 out of 2 positive blood cultures for  gram-positive cocci.  Suspect this is contamination.  Continue to follow-up sensitivities. 8. Aspiration.  Patient had frequent vomiting and subsequently developed shortness of breath and bilateral rhonchi.  Currently on IV antibiotics for aspiration pneumonia.  Continue to monitor. 9. History of stroke with left-sided hemiparesis.  Continue supportive care, Plavix and statin. 10. Chronic diastolic congestive heart failure.  Appears compensated at this time.  Will hold off on further IV fluids as not to precipitate decompensation. 11. Metabolic acidosis.  Possibly related to GI losses.  Improved with bicarbonate infusion.  We will discontinue bicarbonate. 12. Diabetes.  Blood sugars have been stable.  On Lantus and sliding scale insulin.  Continue current treatments. 13. Hypokalemia.  Related to diminished oral intake as well as bicarbonate infusion.  Continue with aggressive IV replacement.  Recheck level this afternoon.  Magnesium stable.  Continue to monitor.   DVT prophylaxis: Heparin Code Status: Full code Family Communication: No family present Disposition Plan: Return to nursing facility on discharge   Consultants:   Gastroenterology  General surgery  Procedures:     Antimicrobials:   Rocephin 3/14 > 3/17, 3/20 >   Subjective: She reports having bowel movements.  Reports that "stomach is rolling".  NG tube was clamped yesterday.  Still having nausea.  Objective: Vitals:   11/09/17 0500 11/09/17 0600 11/09/17 0700 11/09/17 0750  BP: (!) 169/82 (!) 160/79 (!) 168/83   Pulse: 83 79 80 82  Resp: 20 18 18 20   Temp: 97.9 F (36.6 C)     TempSrc: Oral     SpO2: 91% 93% 91% 92%  Weight: 100 kg (220 lb  7.4 oz)     Height:        Intake/Output Summary (Last 24 hours) at 11/09/2017 0853 Last data filed at 11/09/2017 0500 Gross per 24 hour  Intake 904.17 ml  Output 1000 ml  Net -95.83 ml   Filed Weights   11/07/17 0600 11/08/17 0500 11/09/17 0500  Weight: 99.9 kg (220  lb 3.8 oz) 100.5 kg (221 lb 9 oz) 100 kg (220 lb 7.4 oz)    Examination:  General exam: Alert, awake, oriented x 3 Respiratory system: Scattered bilateral rhonchi. Respiratory effort normal. Cardiovascular system:RRR. No murmurs, rubs, gallops. Gastrointestinal system: Abdomen is distended, soft and nontender. No organomegaly or masses felt.  Bowel sounds are sluggish Central nervous system: Alert and oriented. No focal neurological deficits. Extremities: 1+ edema bilaterally Skin: No rashes, lesions or ulcers Psychiatry: Confused, pleasant  Data Reviewed: I have personally reviewed following labs and imaging studies  CBC: Recent Labs  Lab 11/03/17 0559 11/05/17 0415 11/06/17 0528 11/07/17 0838 11/09/17 0421  WBC 12.3* 18.7* 21.0* 19.5* 13.2*  NEUTROABS  --  14.0 17.0* 15.6*  --   HGB 12.6 13.3 13.8 14.1 11.2*  HCT 39.5 41.5 43.8 44.2 35.8*  MCV 88.2 88.9 89.8 90.9 90.4  PLT 281 307 298 247 243   Basic Metabolic Panel: Recent Labs  Lab 11/02/17 1652  11/04/17 0531 11/05/17 0415 11/06/17 0528 11/07/17 0838 11/09/17 0421  NA  --    < > 144 143 142 143 152*  K  --    < > 2.7* 3.1* 3.3* 3.0* 1.9*  CL  --    < > 117* 117* 117* 114* 115*  CO2  --    < > 17* 15* 12* 16* 25  GLUCOSE  --    < > 96 119* 133* 124* 141*  BUN  --    < > 11 13 18 15 9   CREATININE  --    < > 0.95 0.98 1.00 0.99 0.83  CALCIUM  --    < > 8.7* 8.5* 8.5* 8.6* 8.1*  MG 1.9  --  1.7 2.0 2.1 2.0  --   PHOS  --   --   --   --  3.0 3.0  --    < > = values in this interval not displayed.   GFR: Estimated Creatinine Clearance: 69.9 mL/min (by C-G formula based on SCr of 0.83 mg/dL). Liver Function Tests: Recent Labs  Lab 11/06/17 0528 11/07/17 0838  AST 35 33  ALT 25 26  ALKPHOS 65 60  BILITOT 1.0 0.9  PROT 6.0* 6.2*  ALBUMIN 2.9* 2.9*   No results for input(s): LIPASE, AMYLASE in the last 168 hours. No results for input(s): AMMONIA in the last 168 hours. Coagulation Profile: No results for  input(s): INR, PROTIME in the last 168 hours. Cardiac Enzymes: No results for input(s): CKTOTAL, CKMB, CKMBINDEX, TROPONINI in the last 168 hours. BNP (last 3 results) No results for input(s): PROBNP in the last 8760 hours. HbA1C: No results for input(s): HGBA1C in the last 72 hours. CBG: Recent Labs  Lab 11/08/17 0830 11/08/17 1238 11/08/17 1616 11/08/17 2153 11/09/17 0814  GLUCAP 108* 119* 129* 138* 132*   Lipid Profile: No results for input(s): CHOL, HDL, LDLCALC, TRIG, CHOLHDL, LDLDIRECT in the last 72 hours. Thyroid Function Tests: No results for input(s): TSH, T4TOTAL, FREET4, T3FREE, THYROIDAB in the last 72 hours. Anemia Panel: No results for input(s): VITAMINB12, FOLATE, FERRITIN, TIBC, IRON, RETICCTPCT in the last 72 hours. Sepsis  Labs: Recent Labs  Lab 11/05/17 1204 11/06/17 0900  LATICACIDVEN 0.9 1.0    Recent Results (from the past 240 hour(s))  Urine Culture     Status: Abnormal   Collection Time: 10/31/17 10:14 AM  Result Value Ref Range Status   Specimen Description   Final    URINE, RANDOM Performed at [facility], [address], [place], [place] [address]    Special Requests   Final    NONE Performed at [facility], [address] [place], [place] [address]    Culture >=100,000 COLONIES/mL ESCHERICHIA COLI (A)  Final   Report Status 11/03/2017 FINAL  Final   Organism ID, Bacteria ESCHERICHIA COLI (A)  Final      Susceptibility   Escherichia coli - MIC*    AMPICILLIN >=32 RESISTANT Resistant     CEFAZOLIN <=4 SENSITIVE Sensitive     CEFTRIAXONE <=1 SENSITIVE Sensitive     CIPROFLOXACIN <=0.25 SENSITIVE Sensitive     GENTAMICIN >=16 RESISTANT Resistant     IMIPENEM <=0.25 SENSITIVE Sensitive     NITROFURANTOIN <=16 SENSITIVE Sensitive     TRIMETH/SULFA <=20 SENSITIVE Sensitive     AMPICILLIN/SULBACTAM 16 INTERMEDIATE Intermediate     PIP/TAZO <=4 SENSITIVE Sensitive     Extended ESBL NEGATIVE Sensitive     * >=100,000 COLONIES/mL  ESCHERICHIA COLI  MRSA PCR Screening     Status: Abnormal   Collection Time: 10/31/17  4:22 PM  Result Value Ref Range Status   MRSA by PCR POSITIVE (A) NEGATIVE Final    Comment:        The GeneXpert MRSA Assay (FDA approved for NASAL specimens only), is one component of a comprehensive MRSA colonization surveillance program. It is not intended to diagnose MRSA infection nor to guide or monitor treatment for MRSA infections. RESULT CALLED TO, READ BACK BY AND VERIFIED WITH: JACKSON,N AT 614PM ON [phone] BY ISLEY,B Performed at [facility], [address] [place], [place] [address]   C difficile quick scan w PCR reflex     Status: None   Collection Time: 11/02/17  6:34 PM  Result Value Ref Range Status   C Diff antigen NEGATIVE NEGATIVE Final   C Diff toxin NEGATIVE NEGATIVE Final   C Diff interpretation No C. difficile detected.  Final    Comment: Performed at [facility], [address] [place], [place] [address]  C difficile quick scan w PCR reflex     Status: None   Collection Time: 11/06/17  3:45 AM  Result Value Ref Range Status   C Diff antigen NEGATIVE NEGATIVE Final   C Diff toxin NEGATIVE NEGATIVE Final   C Diff interpretation No C. difficile detected.  Final    Comment: Performed at [facility], [address], [place], [place] [address]  Gastrointestinal Panel by PCR , Stool     Status: Abnormal   Collection Time: 11/06/17  3:45 AM  Result Value Ref Range Status   Campylobacter species NOT DETECTED NOT DETECTED Final   Plesimonas shigelloides NOT DETECTED NOT DETECTED Final   Salmonella species NOT DETECTED NOT DETECTED Final   Yersinia enterocolitica NOT DETECTED NOT DETECTED Final   Vibrio species NOT DETECTED NOT DETECTED Final   Vibrio cholerae NOT DETECTED NOT DETECTED Final   Enteroaggregative E coli (EAEC) NOT DETECTED NOT DETECTED Final   Enteropathogenic E coli (EPEC) NOT DETECTED NOT DETECTED Final   Enterotoxigenic E coli (ETEC) NOT DETECTED NOT  DETECTED Final   Shiga like toxin producing E  coli (STEC) NOT DETECTED NOT DETECTED Final   Shigella/Enteroinvasive E coli (EIEC) NOT DETECTED NOT DETECTED Final   Cryptosporidium NOT DETECTED NOT DETECTED Final   Cyclospora cayetanensis NOT DETECTED NOT DETECTED Final   Entamoeba histolytica NOT DETECTED NOT DETECTED Final   Giardia lamblia NOT DETECTED NOT DETECTED Final   Adenovirus F40/41 NOT DETECTED NOT DETECTED Final   Astrovirus NOT DETECTED NOT DETECTED Final   Norovirus GI/GII DETECTED (A) NOT DETECTED Final    Comment: RESULT CALLED TO, READ BACK BY AND VERIFIED WITH: TIFFANY ROBERTS AT 1827 ON 11/14/2017 JJB    Rotavirus A NOT DETECTED NOT DETECTED Final   Sapovirus (I, II, IV, and V) NOT DETECTED NOT DETECTED Final    Comment: Performed at [facility], [address] [place], [place] [address]  Culture, blood (Routine X 2) w Reflex to ID Panel     Status: None (Preliminary result)   Collection Time: 11/06/17  6:49 AM  Result Value Ref Range Status   Specimen Description   Final    BLOOD LEFT HAND Performed at [facility], [address], [place], [place] [address]    Special Requests   Final    BOTTLES DRAWN AEROBIC ONLY Blood Culture adequate volume Performed at [facility], [address] [place], [place] [address]    Culture  Setup Time   Final    GRAM POSITIVE COCCI Gram Stain Report Called to,Read Back By and Verified With: MCDANIEL,M. AT 1720 ON 11/07/2017 BY EVA AEROBIC BOTTLE ONLY Performed at [facility] CRITICAL RESULT CALLED TO, READ BACK BY AND VERIFIED WITH: BWilhemina Bonito [phone] 11/08/2017 T. TYSOR    Culture GRAM POSITIVE COCCI  Final   Report Status PENDING  Incomplete  Blood Culture ID Panel (Reflexed)     Status: None   Collection Time: 11/06/17  6:49 AM  Result Value Ref Range Status   Enterococcus species NOT DETECTED NOT DETECTED Final   Listeria monocytogenes NOT DETECTED NOT DETECTED Final   Staphylococcus species NOT DETECTED  NOT DETECTED Final   Staphylococcus aureus NOT DETECTED NOT DETECTED Final   Streptococcus species NOT DETECTED NOT DETECTED Final   Streptococcus agalactiae NOT DETECTED NOT DETECTED Final   Streptococcus pneumoniae NOT DETECTED NOT DETECTED Final   Streptococcus pyogenes NOT DETECTED NOT DETECTED Final   Acinetobacter baumannii NOT DETECTED NOT DETECTED Final   Enterobacteriaceae species NOT DETECTED NOT DETECTED Final   Enterobacter cloacae complex NOT DETECTED NOT DETECTED Final   Escherichia coli NOT DETECTED NOT DETECTED Final   Klebsiella oxytoca NOT DETECTED NOT DETECTED Final   Klebsiella pneumoniae NOT DETECTED NOT DETECTED Final   Proteus species NOT DETECTED NOT DETECTED Final   Serratia marcescens NOT DETECTED NOT DETECTED Final   Haemophilus influenzae NOT DETECTED NOT DETECTED Final   Neisseria meningitidis NOT DETECTED NOT DETECTED Final   Pseudomonas aeruginosa NOT DETECTED NOT DETECTED Final   Candida albicans NOT DETECTED NOT DETECTED Final   Candida glabrata NOT DETECTED NOT DETECTED Final   Candida krusei NOT DETECTED NOT DETECTED Final   Candida parapsilosis NOT DETECTED NOT DETECTED Final   Candida tropicalis NOT DETECTED NOT DETECTED Final  Culture, blood (Routine X 2) w Reflex to ID Panel     Status: None (Preliminary result)   Collection Time: 11/06/17  7:56 AM  Result Value Ref Range Status   Specimen Description BLOOD LEFT ARM  Final   Special Requests   Final    BOTTLES DRAWN AEROBIC AND ANAEROBIC Blood  Culture adequate volume   Culture   Final    NO GROWTH 3 DAYS Performed at [facility], [address] [place], [place] [address]    Report Status PENDING  Incomplete         Radiology Studies: No results found.      Scheduled Meds: . atorvastatin  20 mg Oral q1800  . baclofen  5 mg Oral BID  . clopidogrel  75 mg Oral Daily  . [START ON 11/11/2017] cyanocobalamin  1,000 mcg Intramuscular Q30 days  . heparin  5,000 Units Subcutaneous Q8H    . insulin aspart  0-5 Units Subcutaneous QHS  . insulin aspart  0-9 Units Subcutaneous TID WC  . insulin glargine  15 Units Subcutaneous QHS  . levothyroxine  125 mcg Oral QAC breakfast  . lidocaine  1 application Topical Once  . mouth rinse  15 mL Mouth Rinse BID  . metoprolol succinate  50 mg Oral Daily  . pantoprazole (PROTONIX) IV  40 mg Intravenous Q24H   Continuous Infusions: . cefTRIAXone (ROCEPHIN)  IV 2 g (11/09/17 6045)  . dextrose 5 % 1,000 mL with potassium chloride 40 mEq infusion    . metronidazole 500 mg (11/09/17 0441)  . potassium chloride       LOS: 5 days    Time spent:    Erick Blinks, MD [facility] Pager [phone]  If 7PM-7AM, please contact night-coverage [email or web address] [employer] 11/09/2017, 8:53 AM

## 2017-11-09 NOTE — Progress Notes (Signed)
[facility] Progress Note     Subjective: No issues reported by patient or family. NG to gravity and 400 in bag, some nausea but no vomiting. When placed to suction, none out.   Objective: Vital signs in last 24 hours: Temp:  [97.3 F (36.3 C)-99 F (37.2 C)] 99 F (37.2 C) (03/23 0800) Pulse Rate:  [73-95] 73 (03/23 1100) Resp:  [15-22] 18 (03/23 1100) BP: (144-173)/(67-87) 147/86 (03/23 1100) SpO2:  [89 %-97 %] 96 % (03/23 1100) Weight:  [220 lb 7.4 oz (100 kg)] 220 lb 7.4 oz (100 kg) (03/23 0500) Last BM Date: 11/07/17  Intake/Output from previous day: 03/22 0701 - 03/23 0700 In: 904.2 [I.V.:704.2; IV Piggyback:200] Out: 1200 [Urine:200; Emesis/NG output:1000] Intake/Output this shift: No intake/output data recorded.  General appearance: alert, cooperative and no distress GI: soft, tender epigastric, upper distention  Lab Results:  Recent Labs    11/07/17 0838 11/09/17 0421  WBC 19.5* 13.2*  HGB 14.1 11.2*  HCT 44.2 35.8*  PLT 247 243   BMET Recent Labs    11/07/17 0838 11/09/17 0421  NA 143 152*  K 3.0* 1.9*  CL 114* 115*  CO2 16* 25  GLUCOSE 124* 141*  BUN 15 9  CREATININE 0.99 0.83  CALCIUM 8.6* 8.1*    Anti-infectives: Anti-infectives (From admission, onward)   Start     Dose/Rate Route Frequency Ordered Stop   11/09/17 0645  cefTRIAXone (ROCEPHIN) 2 g in sodium chloride 0.9 % 100 mL IVPB     2 g 200 mL/hr over 30 Minutes Intravenous Every 24 hours 11/08/17 0744     11/06/17 1000  metroNIDAZOLE (FLAGYL) IVPB 500 mg     500 mg 100 mL/hr over 60 Minutes Intravenous Every 8 hours 11/06/17 0953     11/06/17 0645  cefTRIAXone (ROCEPHIN) 1 g in sodium chloride 0.9 % 100 mL IVPB  Status:  Discontinued     1 g 200 mL/hr over 30 Minutes Intravenous Every 24 hours 11/06/17 0631 11/08/17 0744   10/31/17 1330  metroNIDAZOLE (FLAGYL) IVPB 500 mg  Status:  Discontinued     500 mg 100 mL/hr over 60 Minutes Intravenous Every 8 hours  10/31/17 1317 11/04/17 0733   10/31/17 1315  cefTRIAXone (ROCEPHIN) 1 g in sodium chloride 0.9 % 100 mL IVPB     1 g 200 mL/hr over 30 Minutes Intravenous Every 24 hours 10/31/17 1300 11/02/17 1554   10/31/17 1015  ciprofloxacin (CIPRO) IVPB 400 mg     400 mg 200 mL/hr over 60 Minutes Intravenous  Once 10/31/17 1013 10/31/17 1224      Assessment/Plan: Dawn Ho is a 67 yo with ileus versus pSBO. Having Bms and NG t gravity yesterday and minimal output. -NG to suction for 1 hr, if < 200cc d/c, RN aware -NPO for today but can have sips/ chips   LOS: 5 days    Dawn Ho 11/09/2017

## 2017-11-09 NOTE — Progress Notes (Signed)
Bedside swallow study performed by RN. Able to drink water through a straw without difficulty. Able to swallow small amount of jello without difficulty. Evening meds given crushed with jello without difficulty. Complained of sore throat. Explained that it could be due to the persistent vomiting Dawn Ho was having as well as the NG tube.

## 2017-11-10 LAB — BASIC METABOLIC PANEL
Anion gap: 9 (ref 5–15)
BUN: 6 mg/dL (ref 6–20)
CHLORIDE: 112 mmol/L — AB (ref 101–111)
CO2: 23 mmol/L (ref 22–32)
Calcium: 7.7 mg/dL — ABNORMAL LOW (ref 8.9–10.3)
Creatinine, Ser: 0.75 mg/dL (ref 0.44–1.00)
GFR calc Af Amer: >60 mL/min (ref >60)
GFR calc non Af Amer: >60 mL/min (ref >60)
GLUCOSE: 264 mg/dL — AB (ref 65–99)
POTASSIUM: 3.2 mmol/L — AB (ref 3.5–5.1)
Sodium: 144 mmol/L (ref 135–145)

## 2017-11-10 LAB — CBC
HEMATOCRIT: 36.2 % (ref 36.0–46.0)
Hemoglobin: 11.5 g/dL — ABNORMAL LOW (ref 12.0–15.0)
MCH: 28.8 pg (ref 26.0–34.0)
MCHC: 31.8 g/dL (ref 30.0–36.0)
MCV: 90.7 fL (ref 78.0–100.0)
Platelets: 215 10*3/uL (ref 150–400)
RBC: 3.99 MIL/uL (ref 3.87–5.11)
RDW: 15.6 % — AB (ref 11.5–15.5)
WBC: 11.6 10*3/uL — ABNORMAL HIGH (ref 4.0–10.5)

## 2017-11-10 LAB — GLUCOSE, CAPILLARY
GLUCOSE-CAPILLARY: 174 mg/dL — AB (ref 65–99)
GLUCOSE-CAPILLARY: 182 mg/dL — AB (ref 65–99)
Glucose-Capillary: 164 mg/dL — ABNORMAL HIGH (ref 65–99)
Glucose-Capillary: 187 mg/dL — ABNORMAL HIGH (ref 65–99)
Glucose-Capillary: 156 mg/dL — ABNORMAL HIGH (ref 65–99)

## 2017-11-10 NOTE — Progress Notes (Addendum)
PROGRESS NOTE    Dawn Ho  WUJ:[id number] DOB: 29-Sep-1950 DOA: 10/31/2017 PCP: Bernerd Limbo, MD    Brief Narrative:  67 year old female with a history of previous right MCA stroke, hypertension, diabetes and diastolic heart failure, who is a resident of a nursing facility, admitted to the hospital with altered mental status as well as GI issues including vomiting and diarrhea.  Urinalysis indicated E. coli UTI and she was treated with intravenous antibiotics.  Stool studies positive for norovirus and she was treated supportively.  She also had acute kidney injury related to dehydration that was treated with IV fluids.  Renal function has since normalized.  Hospital course complicated by development of persistent vomiting.  Abdominal imaging showed partial small bowel obstruction versus ileus.  NG tube was placed for decompression and general surgery following.  She is slowly improving and NG tube as has been discontinued.  Diet is being advanced.   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 Ileus versus partial small bowel obstruction.  Patient developed persistent vomiting of bilious material.  NG tube placed for decompression with symptomatic improvement.  General surgery following.  Patient is now having bowel movement and NG tube is been discontinued.  She was started on sips of clears. 2. Diarrhea.  Related to Noro virus.  Treating supportively.  Clinically appears to be improving. 3. Leukocytosis.  Suspect this is related to dehydration/UTI/aspiration.  Trending down. 4. E. coli urinary tract infection.  Treated with ceftriaxone. 5. Metabolic encephalopathy  superimposed on baseline dementia.  Due to dehydration.  Appears to be improving.  Continue current treatments. 6. Acute kidney injury.  Related to dehydration.  Resolved. 7. Positive blood cultures.  Patient has 1 out of 2 positive blood cultures for coagulase-negative staph.  Likely contamination.. 8. Aspiration.  Patient had frequent vomiting and subsequently developed shortness of breath and bilateral rhonchi.  She has been on IV of antibiotics for suspected aspiration pneumonia.  She has not had any fever and respiratory status is stable.  We will discontinue further antibiotics. 9. History of stroke with left-sided hemiparesis.  Continue supportive care, Plavix and statin. 10. Chronic diastolic congestive heart failure.  Appears compensated at this time.  Will hold off on further IV fluids as not to precipitate decompensation. 11. Metabolic acidosis.  Possibly related to GI losses.  Improved with bicarbonate infusion.  This is been discontinued. 12. Diabetes.  Blood sugar stable on Lantus and sliding scale insulin.  Continue current treatments. 13. Hypokalemia.  Replaced, magnesium stable.   DVT prophylaxis: Heparin Code Status: Full code Family Communication: No family present Disposition Plan: Return to nursing facility on discharge   Consultants:   Gastroenterology  General surgery  Procedures:     Antimicrobials:   Rocephin 3/14 > 3/17, 3/20 >3/24   Subjective: Denies any nausea or vomiting.  She reports she is having bowel movements.  Objective: Vitals:   11/10/17 1300 11/10/17 1400 11/10/17 1500 11/10/17 1600  BP: 127/61 132/75 134/72 (!) 163/76  Pulse: (!) 53 (!) 56 (!) 57 64  Resp: 15 17 17 20   Temp:      TempSrc:  SpO2: 96% 96% 96% 93%  Weight:      Height:        Intake/Output Summary (Last 24 hours) at 11/10/2017 1655 Last data filed at 11/10/2017 0800 Gross per 24 hour  Intake 300 ml  Output 1400 ml  Net -1100 ml   Filed Weights   11/08/17  0500 11/09/17 0500 11/10/17 0500  Weight: 100.5 kg (221 lb 9 oz) 100 kg (220 lb 7.4 oz) 99 kg (218 lb 4.1 oz)    Examination:  General exam: No distress, lethargic Respiratory system: Clear bilaterally. Respiratory effort normal. Cardiovascular system:RRR. No murmurs, rubs, gallops. Gastrointestinal system: Abdomen appears less distended, soft and nontender. No organomegaly or masses felt.  Bowel sounds are sluggish Central nervous system: No focal neurological deficits. Extremities: 1+ edema bilaterally Skin: No rashes, lesions or ulcers Psychiatry: Somnolent, but wakes up to voice.  Data Reviewed: I have personally reviewed following labs and imaging studies  CBC: Recent Labs  Lab 11/05/17 0415 11/06/17 0528 11/07/17 0838 11/09/17 0421 11/10/17 0358  WBC 18.7* 21.0* 19.5* 13.2* 11.6*  NEUTROABS 14.0 17.0* 15.6*  --   --   HGB 13.3 13.8 14.1 11.2* 11.5*  HCT 41.5 43.8 44.2 35.8* 36.2  MCV 88.9 89.8 90.9 90.4 90.7  PLT 307 298 247 243 215   Basic Metabolic Panel: Recent Labs  Lab 11/04/17 0531 11/05/17 0415 11/06/17 0528 11/07/17 0838 11/09/17 0421 11/09/17 1548 11/10/17 0358  NA 144 143 142 143 152*  --  144  K 2.7* 3.1* 3.3* 3.0* 1.9* 3.0* 3.2*  CL 117* 117* 117* 114* 115*  --  112*  CO2 17* 15* 12* 16* 25  --  23  GLUCOSE 96 119* 133* 124* 141*  --  264*  BUN 11 13 18 15 9   --  6  CREATININE 0.95 0.98 1.00 0.99 0.83  --  0.75  CALCIUM 8.7* 8.5* 8.5* 8.6* 8.1*  --  7.7*  MG 1.7 2.0 2.1 2.0  --   --   --   PHOS  --   --  3.0 3.0  --   --   --    GFR: Estimated Creatinine Clearance: 72.1 mL/min (by C-G formula based on SCr of 0.75 mg/dL). Liver Function Tests: Recent Labs  Lab 11/06/17 0528 11/07/17 0838  AST 35 33  ALT 25 26  ALKPHOS 65 60  BILITOT 1.0 0.9  PROT 6.0* 6.2*  ALBUMIN 2.9* 2.9*   No results for input(s): LIPASE, AMYLASE in the last 168 hours. No results for input(s): AMMONIA in the last 168 hours. Coagulation Profile: No results for  input(s): INR, PROTIME in the last 168 hours. Cardiac Enzymes: No results for input(s): CKTOTAL, CKMB, CKMBINDEX, TROPONINI in the last 168 hours. BNP (last 3 results) No results for input(s): PROBNP in the last 8760 hours. HbA1C: No results for input(s): HGBA1C in the last 72 hours. CBG: Recent Labs  Lab 11/09/17 1630 11/09/17 2202 11/10/17 0742 11/10/17 1141 11/10/17 1627  GLUCAP 158* 149* 182*  174* 164* 187*   Lipid Profile: No results for input(s): CHOL, HDL, LDLCALC, TRIG, CHOLHDL, LDLDIRECT in the last 72 hours. Thyroid Function Tests: No results for input(s): TSH, T4TOTAL, FREET4, T3FREE, THYROIDAB in the last 72 hours. Anemia Panel: No results for input(s): VITAMINB12, FOLATE, FERRITIN, TIBC, IRON, RETICCTPCT in the last 72 hours. Sepsis Labs: Recent Labs  Lab 11/05/17 1204 11/06/17 0900  LATICACIDVEN 0.9 1.0    Recent Results (from the past 240 hour(s))  C difficile quick  scan w PCR reflex     Status: None   Collection Time: 11/02/17  6:34 PM  Result Value Ref Range Status   C Diff antigen NEGATIVE NEGATIVE Final   C Diff toxin NEGATIVE NEGATIVE Final   C Diff interpretation No C. difficile detected.  Final    Comment: Performed at [facility], [address] [place], [place] [address]  C difficile quick scan w PCR reflex     Status: None   Collection Time: 11/06/17  3:45 AM  Result Value Ref Range Status   C Diff antigen NEGATIVE NEGATIVE Final   C Diff toxin NEGATIVE NEGATIVE Final   C Diff interpretation No C. difficile detected.  Final    Comment: Performed at [facility], [address], [place], [place] [address]  Gastrointestinal Panel by PCR , Stool     Status: Abnormal   Collection Time: 11/06/17  3:45 AM  Result Value Ref Range Status   Campylobacter species NOT DETECTED NOT DETECTED Final   Plesimonas shigelloides NOT DETECTED NOT DETECTED Final   Salmonella species NOT DETECTED NOT DETECTED Final   Yersinia enterocolitica NOT DETECTED NOT  DETECTED Final   Vibrio species NOT DETECTED NOT DETECTED Final   Vibrio cholerae NOT DETECTED NOT DETECTED Final   Enteroaggregative E coli (EAEC) NOT DETECTED NOT DETECTED Final   Enteropathogenic E coli (EPEC) NOT DETECTED NOT DETECTED Final   Enterotoxigenic E coli (ETEC) NOT DETECTED NOT DETECTED Final   Shiga like toxin producing E coli (STEC) NOT DETECTED NOT DETECTED Final   Shigella/Enteroinvasive E coli (EIEC) NOT DETECTED NOT DETECTED Final   Cryptosporidium NOT DETECTED NOT DETECTED Final   Cyclospora cayetanensis NOT DETECTED NOT DETECTED Final   Entamoeba histolytica NOT DETECTED NOT DETECTED Final   Giardia lamblia NOT DETECTED NOT DETECTED Final   Adenovirus F40/41 NOT DETECTED NOT DETECTED Final   Astrovirus NOT DETECTED NOT DETECTED Final   Norovirus GI/GII DETECTED (A) NOT DETECTED Final    Comment: RESULT CALLED TO, READ BACK BY AND VERIFIED WITH: TIFFANY ROBERTS AT 1827 ON 11/14/2017 JJB    Rotavirus A NOT DETECTED NOT DETECTED Final   Sapovirus (I, II, IV, and V) NOT DETECTED NOT DETECTED Final    Comment: Performed at [facility], [address] [place], [place] [address]  Culture, blood (Routine X 2) w Reflex to ID Panel     Status: Abnormal   Collection Time: 11/06/17  6:49 AM  Result Value Ref Range Status   Specimen Description   Final    BLOOD LEFT HAND Performed at [facility], [address], [place], [place] [address]    Special Requests   Final    BOTTLES DRAWN AEROBIC ONLY Blood Culture adequate volume Performed at [facility], [address], [place], [place] [address]    Culture  Setup Time   Final    GRAM POSITIVE COCCI Gram Stain Report Called to,Read Back By and Verified With: MCDANIEL,M. AT 1720 ON 11/07/2017 BY EVA AEROBIC BOTTLE ONLY Performed at [facility] CRITICAL RESULT CALLED TO, READ BACK BY AND VERIFIED WITH: B. ACORD,RN 0056 11/08/2017 T. TYSOR    Culture (A)  Final    STAPHYLOCOCCUS SPECIES (COAGULASE  NEGATIVE) THE SIGNIFICANCE OF ISOLATING THIS ORGANISM FROM A SINGLE SET OF BLOOD CULTURES WHEN MULTIPLE SETS ARE DRAWN IS UNCERTAIN. PLEASE NOTIFY THE MICROBIOLOGY DEPARTMENT WITHIN ONE WEEK IF SPECIATION AND SENSITIVITIES ARE REQUIRED. Performed at [facility], [address] [place], [place] [address]  Report Status 11/09/2017 FINAL  Final  Blood Culture ID Panel (Reflexed)     Status: None   Collection Time: 11/06/17  6:49 AM  Result Value Ref Range Status   Enterococcus species NOT DETECTED NOT DETECTED Final   Listeria monocytogenes NOT DETECTED NOT DETECTED Final   Staphylococcus species NOT DETECTED NOT DETECTED Final   Staphylococcus aureus NOT DETECTED NOT DETECTED Final   Streptococcus species NOT DETECTED NOT DETECTED Final   Streptococcus agalactiae NOT DETECTED NOT DETECTED Final   Streptococcus pneumoniae NOT DETECTED NOT DETECTED Final   Streptococcus pyogenes NOT DETECTED NOT DETECTED Final   Acinetobacter baumannii NOT DETECTED NOT DETECTED Final   Enterobacteriaceae species NOT DETECTED NOT DETECTED Final   Enterobacter cloacae complex NOT DETECTED NOT DETECTED Final   Escherichia coli NOT DETECTED NOT DETECTED Final   Klebsiella oxytoca NOT DETECTED NOT DETECTED Final   Klebsiella pneumoniae NOT DETECTED NOT DETECTED Final   Proteus species NOT DETECTED NOT DETECTED Final   Serratia marcescens NOT DETECTED NOT DETECTED Final   Haemophilus influenzae NOT DETECTED NOT DETECTED Final   Neisseria meningitidis NOT DETECTED NOT DETECTED Final   Pseudomonas aeruginosa NOT DETECTED NOT DETECTED Final   Candida albicans NOT DETECTED NOT DETECTED Final   Candida glabrata NOT DETECTED NOT DETECTED Final   Candida krusei NOT DETECTED NOT DETECTED Final   Candida parapsilosis NOT DETECTED NOT DETECTED Final   Candida tropicalis NOT DETECTED NOT DETECTED Final  Culture, blood (Routine X 2) w Reflex to ID Panel     Status: None (Preliminary result)   Collection Time:  11/06/17  7:56 AM  Result Value Ref Range Status   Specimen Description BLOOD LEFT ARM  Final   Special Requests   Final    BOTTLES DRAWN AEROBIC AND ANAEROBIC Blood Culture adequate volume   Culture  Setup Time   Final    GRAM POSITIVE COCCI Gram Stain Report Called to,Read Back By and Verified With: DANIELS,J. AT 2229 ON 11/09/2017 BY EVA AEROBIC BOTTLE ONLY Performed at [facility] GRAM STAIN REVIEWED-AGREE WITH RESULT T. TYSOR CRITICAL RESULT CALLED TO, READ BACK BY AND VERIFIED WITH: Rexene Alberts 1610 11/10/2017 Girtha Hake    Culture   Final    NO GROWTH 4 DAYS Performed at [facility], [address] [place], [place] [address]    Report Status PENDING  Incomplete         Radiology Studies: Dg Abd 1 View  Result Date: 11/09/2017 CLINICAL DATA:  Small-bowel obstruction EXAM: ABDOMEN - 1 VIEW COMPARISON:  Portable exam 1103 hours compared to 11/06/2017 FINDINGS: Tip of nasogastric tube projects over proximal stomach though the proximal side-port may be at the distal esophagus. Rotated to the RIGHT; due to rotation, portions of the RIGHT abdomen and inferior pelvis are not visualized. Slight gaseous distention of gastric antrum. Gaseous distension of cecum. Numerous air-filled distended loops of small bowel throughout the upper and mid abdomen. No definite bowel wall thickening. Bones demineralized. IMPRESSION: Persistent small bowel dilatation with mild associated gaseous distention of the cecum. Electronically Signed   By: Ulyses Southward M.D.   On: 11/09/2017 13:07        Scheduled Meds: . atorvastatin  20 mg Oral q1800  . baclofen  5 mg Oral BID  . clopidogrel  75 mg Oral Daily  . [START ON 11/11/2017] cyanocobalamin  1,000 mcg Intramuscular Q30 days  . heparin  5,000 Units Subcutaneous Q8H  . insulin aspart  0-5 Units Subcutaneous QHS  .  insulin aspart  0-9 Units Subcutaneous TID WC  . insulin glargine  15 Units Subcutaneous QHS  . levothyroxine  125 mcg Oral QAC  breakfast  . lidocaine  1 application Topical Once  . mouth rinse  15 mL Mouth Rinse BID  . metoprolol succinate  50 mg Oral Daily  . pantoprazole (PROTONIX) IV  40 mg Intravenous Q24H   Continuous Infusions: . cefTRIAXone (ROCEPHIN)  IV 2 g (11/10/17 0544)  . dextrose 5 % and 0.45 % NaCl with KCl 40 mEq/L 100 mL/hr at 11/10/17 1209  . metronidazole Stopped (11/10/17 1102)     LOS: 6 days    Time spent:    Erick Blinks, MD [facility] Pager [phone]  If 7PM-7AM, please contact night-coverage [email or web address] Password [facility] 11/10/2017, 4:55 PM

## 2017-11-10 NOTE — Progress Notes (Signed)
[facility] Progress Note     Subjective: More sleepy this AM. Answering questions, says no pain. BMs recorded.   Objective: Vital signs in last 24 hours: Temp:  [97.9 F (36.6 C)-100.2 F (37.9 C)] 98.8 F (37.1 C) (03/24 0800) Pulse Rate:  [64-81] 64 (03/24 0400) Resp:  [16-20] 18 (03/24 0400) BP: (121-176)/(65-103) 153/77 (03/24 0400) SpO2:  [91 %-96 %] 91 % (03/24 0400) Weight:  [218 lb 4.1 oz (99 kg)] 218 lb 4.1 oz (99 kg) (03/24 0500) Last BM Date: 11/07/17  Intake/Output from previous day: 03/23 0701 - 03/24 0700 In: 1518.3 [I.V.:818.3; IV Piggyback:700] Out: 800 [Emesis/NG output:800] Intake/Output this shift: No intake/output data recorded.  General appearance: no distress and sleeping but can be awoken GI: soft, nontender, epigastric area full but no specific distention  Lab Results:  Recent Labs    11/09/17 0421 11/10/17 0358  WBC 13.2* 11.6*  HGB 11.2* 11.5*  HCT 35.8* 36.2  PLT 243 215   BMET Recent Labs    11/09/17 0421 11/09/17 1548 11/10/17 0358  NA 152*  --  144  K 1.9* 3.0* 3.2*  CL 115*  --  112*  CO2 25  --  23  GLUCOSE 141*  --  264*  BUN 9  --  6  CREATININE 0.83  --  0.75  CALCIUM 8.1*  --  7.7*    Personally reviewed- air in cecum, but small bowel remains distended  Studies/Results: Dg Abd 1 View  Result Date: 11/09/2017 CLINICAL DATA:  Small-bowel obstruction EXAM: ABDOMEN - 1 VIEW COMPARISON:  Portable exam 1103 hours compared to 11/06/2017 FINDINGS: Tip of nasogastric tube projects over proximal stomach though the proximal side-port may be at the distal esophagus. Rotated to the RIGHT; due to rotation, portions of the RIGHT abdomen and inferior pelvis are not visualized. Slight gaseous distention of gastric antrum. Gaseous distension of cecum. Numerous air-filled distended loops of small bowel throughout the upper and mid abdomen. No definite bowel wall thickening. Bones demineralized. IMPRESSION: Persistent  small bowel dilatation with mild associated gaseous distention of the cecum. Electronically Signed   By: Ulyses SouthwardMark  Boles M.D.   On: 11/09/2017 13:07    Anti-infectives: Anti-infectives (From admission, onward)   Start     Dose/Rate Route Frequency Ordered Stop   11/09/17 0645  cefTRIAXone (ROCEPHIN) 2 g in sodium chloride 0.9 % 100 mL IVPB     2 g 200 mL/hr over 30 Minutes Intravenous Every 24 hours 11/08/17 0744     11/06/17 1000  metroNIDAZOLE (FLAGYL) IVPB 500 mg     500 mg 100 mL/hr over 60 Minutes Intravenous Every 8 hours 11/06/17 0953     11/06/17 0645  cefTRIAXone (ROCEPHIN) 1 g in sodium chloride 0.9 % 100 mL IVPB  Status:  Discontinued     1 g 200 mL/hr over 30 Minutes Intravenous Every 24 hours 11/06/17 0631 11/08/17 0744   10/31/17 1330  metroNIDAZOLE (FLAGYL) IVPB 500 mg  Status:  Discontinued     500 mg 100 mL/hr over 60 Minutes Intravenous Every 8 hours 10/31/17 1317 11/04/17 0733   10/31/17 1315  cefTRIAXone (ROCEPHIN) 1 g in sodium chloride 0.9 % 100 mL IVPB     1 g 200 mL/hr over 30 Minutes Intravenous Every 24 hours 10/31/17 1300 11/02/17 1554   10/31/17 1015  ciprofloxacin (CIPRO) IVPB 400 mg     400 mg 200 mL/hr over 60 Minutes Intravenous  Once 10/31/17 1013 10/31/17 1224  Assessment/Plan: Dawn Ho is a 67 yo with ileus versus pSBO. Having Bms and NG out. More sleepy today. Xray with continued distention but air in the cecum.   -Would go slowing and just give ice/ sips today as tolerated with mental status     LOS: 6 days    Lucretia Roers 11/10/2017
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
## 2017-11-11 ENCOUNTER — Inpatient Hospital Stay (HOSPITAL_COMMUNITY): Payer: Medicare Other

## 2017-11-11 LAB — CBC
HEMATOCRIT: 36.3 % (ref 36.0–46.0)
HEMOGLOBIN: 11.5 g/dL — AB (ref 12.0–15.0)
MCH: 28.5 pg (ref 26.0–34.0)
MCHC: 31.7 g/dL (ref 30.0–36.0)
MCV: 90.1 fL (ref 78.0–100.0)
Platelets: 228 10*3/uL (ref 150–400)
RBC: 4.03 MIL/uL (ref 3.87–5.11)
RDW: 15.6 % — AB (ref 11.5–15.5)
WBC: 12 10*3/uL — ABNORMAL HIGH (ref 4.0–10.5)

## 2017-11-11 LAB — BASIC METABOLIC PANEL
Anion gap: 9 (ref 5–15)
BUN: 5 mg/dL — AB (ref 6–20)
CHLORIDE: 112 mmol/L — AB (ref 101–111)
CO2: 23 mmol/L (ref 22–32)
Calcium: 8.2 mg/dL — ABNORMAL LOW (ref 8.9–10.3)
Creatinine, Ser: 0.86 mg/dL (ref 0.44–1.00)
GFR calc Af Amer: >60 mL/min (ref >60)
GFR calc non Af Amer: >60 mL/min (ref >60)
Glucose, Bld: 160 mg/dL — ABNORMAL HIGH (ref 65–99)
POTASSIUM: 2.2 mmol/L — AB (ref 3.5–5.1)
Sodium: 144 mmol/L (ref 135–145)

## 2017-11-11 LAB — CULTURE, BLOOD (ROUTINE X 2): Special Requests: ADEQUATE

## 2017-11-11 LAB — GLUCOSE, CAPILLARY
GLUCOSE-CAPILLARY: 142 mg/dL — AB (ref 65–99)
GLUCOSE-CAPILLARY: 125 mg/dL — AB (ref 65–99)
GLUCOSE-CAPILLARY: 118 mg/dL — AB (ref 65–99)
GLUCOSE-CAPILLARY: 98 mg/dL (ref 65–99)

## 2017-11-11 LAB — MAGNESIUM: Magnesium: 1.7 mg/dL (ref 1.7–2.4)

## 2017-11-11 LAB — PHOSPHORUS: Phosphorus: 2.9 mg/dL (ref 2.5–4.6)

## 2017-11-11 MED ORDER — POTASSIUM CHLORIDE IN NACL 40-0.9 MEQ/L-% IV SOLN
INTRAVENOUS | Status: AC
  Administered 2017-11-11 (×2): 88 mL/h via INTRAVENOUS

## 2017-11-11 MED ORDER — POTASSIUM CHLORIDE 10 MEQ/100ML IV SOLN
10.0000 meq | INTRAVENOUS | Status: AC
  Administered 2017-11-11 (×4): 10 meq via INTRAVENOUS
  Filled 2017-11-11 (×4): qty 100

## 2017-11-11 MED ORDER — POTASSIUM CHLORIDE 10 MEQ/100ML IV SOLN: 10.0000 meq | INTRAVENOUS | Status: DC

## 2017-11-11 MED ORDER — POTASSIUM CHLORIDE 20 MEQ/15ML (10%) PO SOLN
40.0000 meq | Freq: Once | ORAL | Status: AC
  Administered 2017-11-11: 40 meq via ORAL
  Filled 2017-11-11: qty 30

## 2017-11-11 NOTE — Progress Notes (Addendum)
Progress Note    Dawn Ho  MVH:[id number] DOB: [birth year]  DOA: 10/31/2017 PCP: Dawn Limbo, MD    Brief Narrative:   Chief complaint: F/U UTI and ileus.  Medical records reviewed and are as summarized below:  Dawn Ho is an 67 y.o. female with a PMH of right MCA stroke, hypertension, diabetes, and chronic diastolic CHF who was admitted 8/41/32 for evaluation of AMS, vomiting and diarrhea. She was found to have an Escherichia coli UTI which was treated with IV antibiotics. Stool studies were also positive for norovirus.Her renal function improved with IV fluids. Her hospital coursehas been complicated by the development of persistent vomiting secondary to an ileus requiring nasogastric tube decompression.  Assessment/Plan:   Principal Problems:   UTI (urinary tract infection) Treated with ceftriaxone.    Norovirus/diarrhea/abdominal distention Treated supportively with IV fluids. Continue contact precautions.    Ileus versus partial small bowel obstruction Required nasogastric decompression with symptomatic improvement. Surgery following but no surgical indication at present. NG tube has now been discontinued and diet has been advanced to sips of clear liquids. Check KUB.   Active Problems:   Diabetes mellitus (HCC) Currently being managed with insulin sensitive SSI and 15 units of Lantus daily. CBGs 142-187.    Hyperlipidemia Continue Lipitor.    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 Has residual left hemiparesis.    Hypokalemia Potassium runs 8 ordered. Magnesium okay.    Aspiration into airway/Acute respiratory distress Respiratory status remains stable.  Body mass index is 43.23 kg/m.   Family Communication/Anticipated D/C date and plan/Code Status   DVT prophylaxis: Heparin ordered. Code Status: Full Code.  Family Communication: No family currently at the  bedside. Disposition Plan: SNF when diet can be successfully advanced.   Medical Consultants:    Gastroenterology  General surgery   Anti-Infectives:     Rocephin 3/14 > 3/17, 3/20 >3/24  Subjective:   The patient reports that she doesn't feel well and that she has ongoing nausea but no frank vomiting.Says that her bowels have been moving.  Objective:    Vitals:   11/11/17 1300 11/11/17 1400 11/11/17 1500 11/11/17 1600  BP: (!) 164/78 (!) 169/78  (!) 170/85  Pulse: 68 65    Resp: 17 17 17 16   Temp:      TempSrc:      SpO2: 96% 94%    Weight:      Height:        Intake/Output Summary (Last 24 hours) at 11/11/2017 1603 Last data filed at 11/11/2017 1430 Gross per 24 hour  Intake 1068.8 ml  Output 600 ml  Net 468.8 ml   Filed Weights   11/09/17 0500 11/10/17 0500 11/11/17 0500  Weight: 100 kg (220 lb 7.4 oz) 99 kg (218 lb 4.1 oz) 100.4 kg (221 lb 5.5 oz)    Exam: General: No acute distress. Frail appearing. Cardiovascular: Heart sounds show a regular rate, and rhythm. No gallops or rubs. No murmurs. No JVD. Lungs: Clear to auscultation bilaterally with good air movement. No rales, rhonchi or wheezes. Abdomen: Firm with diminished bowel sounds. No masses. No hepatosplenomegaly. Neurological: Alert and oriented 3. Moves all extremities 4 with equal strength. Cranial nerves II through XII grossly intact. Skin: Warm and dry. No rashes or lesions. Extremities: No clubbing or cyanosis. No edema. Pedal pulses 2+. Psychiatric: Mood and affect are normal. Insight and judgment are fair.   Data  Reviewed:   I have personally reviewed following labs and imaging studies:  Labs: Labs show the following:   Basic Metabolic Panel: Recent Labs  Lab 11/05/17 0415 11/06/17 0528 11/07/17 0838 11/09/17 0421  11/10/17 0358 11/11/17 0415  NA 143 142 143 152*  --  144 144  K 3.1* 3.3* 3.0* 1.9*   < > 3.2* 2.2*  CL 117* 117* 114* 115*  --  112* 112*  CO2 15* 12* 16*  25  --  23 23  GLUCOSE 119* 133* 124* 141*  --  264* 160*  BUN 13 18 15 9   --  6 5*  CREATININE 0.98 1.00 0.99 0.83  --  0.75 0.86  CALCIUM 8.5* 8.5* 8.6* 8.1*  --  7.7* 8.2*  MG 2.0 2.1 2.0  --   --   --  1.7  PHOS  --  3.0 3.0  --   --   --  2.9   < > = values in this interval not displayed.   GFR Estimated Creatinine Clearance: 67.6 mL/min (by C-G formula based on SCr of 0.86 mg/dL). Liver Function Tests: Recent Labs  Lab 11/06/17 0528 11/07/17 0838  AST 35 33  ALT 25 26  ALKPHOS 65 60  BILITOT 1.0 0.9  PROT 6.0* 6.2*  ALBUMIN 2.9* 2.9*   CBC: Recent Labs  Lab 11/05/17 0415 11/06/17 0528 11/07/17 0838 11/09/17 0421 11/10/17 0358 11/11/17 0415  WBC 18.7* 21.0* 19.5* 13.2* 11.6* 12.0*  NEUTROABS 14.0 17.0* 15.6*  --   --   --   HGB 13.3 13.8 14.1 11.2* 11.5* 11.5*  HCT 41.5 43.8 44.2 35.8* 36.2 36.3  MCV 88.9 89.8 90.9 90.4 90.7 90.1  PLT 307 298 247 243 215 228   CBG: Recent Labs  Lab 11/10/17 1141 11/10/17 1627 11/10/17 2222 11/11/17 0812 11/11/17 1055  GLUCAP 164* 187* 156* 142* 125*   Sepsis Labs: Recent Labs  Lab 11/05/17 1204  11/06/17 0900 11/07/17 0838 11/09/17 0421 11/10/17 0358 11/11/17 0415  WBC  --    < >  --  19.5* 13.2* 11.6* 12.0*  LATICACIDVEN 0.9  --  1.0  --   --   --   --    < > = values in this interval not displayed.    Microbiology Recent Results (from the past 240 hour(s))  C difficile quick scan w PCR reflex     Status: None   Collection Time: 11/02/17  6:34 PM  Result Value Ref Range Status   C Diff antigen NEGATIVE NEGATIVE Final   C Diff toxin NEGATIVE NEGATIVE Final   C Diff interpretation No C. difficile detected.  Final    Comment: Performed at [facility], [address], [place], [place] [address]  C difficile quick scan w PCR reflex     Status: None   Collection Time: 11/06/17  3:45 AM  Result Value Ref Range Status   C Diff antigen NEGATIVE NEGATIVE Final   C Diff toxin NEGATIVE NEGATIVE Final   C Diff  interpretation No C. difficile detected.  Final    Comment: Performed at [facility], [address] [place], [place] [address]  Gastrointestinal Panel by PCR , Stool     Status: Abnormal   Collection Time: 11/06/17  3:45 AM  Result Value Ref Range Status   Campylobacter species NOT DETECTED NOT DETECTED Final   Plesimonas shigelloides NOT DETECTED NOT DETECTED Final   Salmonella species NOT DETECTED NOT DETECTED Final   Yersinia enterocolitica NOT  DETECTED NOT DETECTED Final   Vibrio species NOT DETECTED NOT DETECTED Final   Vibrio cholerae NOT DETECTED NOT DETECTED Final   Enteroaggregative E coli (EAEC) NOT DETECTED NOT DETECTED Final   Enteropathogenic E coli (EPEC) NOT DETECTED NOT DETECTED Final   Enterotoxigenic E coli (ETEC) NOT DETECTED NOT DETECTED Final   Shiga like toxin producing E coli (STEC) NOT DETECTED NOT DETECTED Final   Shigella/Enteroinvasive E coli (EIEC) NOT DETECTED NOT DETECTED Final   Cryptosporidium NOT DETECTED NOT DETECTED Final   Cyclospora cayetanensis NOT DETECTED NOT DETECTED Final   Entamoeba histolytica NOT DETECTED NOT DETECTED Final   Giardia lamblia NOT DETECTED NOT DETECTED Final   Adenovirus F40/41 NOT DETECTED NOT DETECTED Final   Astrovirus NOT DETECTED NOT DETECTED Final   Norovirus GI/GII DETECTED (A) NOT DETECTED Final    Comment: RESULT CALLED TO, READ BACK BY AND VERIFIED WITH: TIFFANY ROBERTS AT 1827 ON 11/14/2017 JJB    Rotavirus A NOT DETECTED NOT DETECTED Final   Sapovirus (I, II, IV, and V) NOT DETECTED NOT DETECTED Final    Comment: Performed at [facility], [address] [place], [place] [address]  Culture, blood (Routine X 2) w Reflex to ID Panel     Status: Abnormal   Collection Time: 11/06/17  6:49 AM  Result Value Ref Range Status   Specimen Description   Final    BLOOD LEFT HAND Performed at [facility], [address], [place], [place] [address]    Special Requests   Final    BOTTLES DRAWN AEROBIC ONLY Blood  Culture adequate volume Performed at [facility], [address], [place], [place] [address]    Culture  Setup Time   Final    GRAM POSITIVE COCCI Gram Stain Report Called to,Read Back By and Verified With: MCDANIEL,M. AT 1720 ON 11/07/2017 BY EVA AEROBIC BOTTLE ONLY Performed at [facility] CRITICAL RESULT CALLED TO, READ BACK BY AND VERIFIED WITH: B. ACORD,RN 0056 11/08/2017 T. TYSOR    Culture (A)  Final    STAPHYLOCOCCUS SPECIES (COAGULASE NEGATIVE) SUSCEPTIBILITIES PERFORMED ON PREVIOUS CULTURE WITHIN THE LAST 5 DAYS. Performed at [facility], [address] [place], [place] [address]    Report Status 11/11/2017 FINAL  Final  Blood Culture ID Panel (Reflexed)     Status: None   Collection Time: 11/06/17  6:49 AM  Result Value Ref Range Status   Enterococcus species NOT DETECTED NOT DETECTED Final   Listeria monocytogenes NOT DETECTED NOT DETECTED Final   Staphylococcus species NOT DETECTED NOT DETECTED Final   Staphylococcus aureus NOT DETECTED NOT DETECTED Final   Streptococcus species NOT DETECTED NOT DETECTED Final   Streptococcus agalactiae NOT DETECTED NOT DETECTED Final   Streptococcus pneumoniae NOT DETECTED NOT DETECTED Final   Streptococcus pyogenes NOT DETECTED NOT DETECTED Final   Acinetobacter baumannii NOT DETECTED NOT DETECTED Final   Enterobacteriaceae species NOT DETECTED NOT DETECTED Final   Enterobacter cloacae complex NOT DETECTED NOT DETECTED Final   Escherichia coli NOT DETECTED NOT DETECTED Final   Klebsiella oxytoca NOT DETECTED NOT DETECTED Final   Klebsiella pneumoniae NOT DETECTED NOT DETECTED Final   Proteus species NOT DETECTED NOT DETECTED Final   Serratia marcescens NOT DETECTED NOT DETECTED Final   Haemophilus influenzae NOT DETECTED NOT DETECTED Final   Neisseria meningitidis NOT DETECTED NOT DETECTED Final   Pseudomonas aeruginosa NOT DETECTED NOT DETECTED Final   Candida albicans NOT DETECTED NOT DETECTED Final   Candida  glabrata NOT DETECTED NOT DETECTED Final   Candida krusei NOT DETECTED NOT DETECTED Final   Candida parapsilosis NOT DETECTED NOT DETECTED Final   Candida tropicalis NOT DETECTED NOT DETECTED Final  Culture, blood (Routine X 2) w Reflex to ID Panel     Status: Abnormal (Preliminary result)   Collection Time: 11/06/17  7:56 AM  Result Value Ref Range Status   Specimen Description   Final    BLOOD LEFT ARM Performed at [facility], [address], [place], [place] [address]    Special Requests   Final    BOTTLES DRAWN AEROBIC AND ANAEROBIC Blood Culture adequate volume Performed at [facility], [address], [place], [place] [address]    Culture  Setup Time   Final    GRAM POSITIVE COCCI Gram Stain Report Called to,Read Back By and Verified With: DANIELS,J. AT 2229 ON 11/09/2017 BY EVA AEROBIC BOTTLE ONLY Performed at [facility] GRAM STAIN REVIEWED-AGREE WITH RESULT T. TYSOR CRITICAL RESULT CALLED TO, READ BACK BY AND VERIFIED WITH: J. Dahlia Client 1027 11/10/2017 T. TYSOR    Culture (A)  Final    STAPHYLOCOCCUS SPECIES (COAGULASE NEGATIVE) SUSCEPTIBILITIES TO FOLLOW Performed at [facility], [address] [place], [place] [address]    Report Status PENDING  Incomplete    Procedures and diagnostic studies:  No results found.  Medications:   . atorvastatin  20 mg Oral q1800  . baclofen  5 mg Oral BID  . clopidogrel  75 mg Oral Daily  . cyanocobalamin  1,000 mcg Intramuscular Q30 days  . heparin  5,000 Units Subcutaneous Q8H  . insulin aspart  0-5 Units Subcutaneous QHS  . insulin aspart  0-9 Units Subcutaneous TID WC  . insulin glargine  15 Units Subcutaneous QHS  . levothyroxine  125 mcg Oral QAC breakfast  . lidocaine  1 application Topical Once  . mouth rinse  15 mL Mouth Rinse BID  . metoprolol succinate  50 mg Oral Daily  . pantoprazole (PROTONIX) IV  40 mg Intravenous Q24H   Continuous Infusions: . 0.9 % NaCl with KCl 40 mEq / L 88 mL/hr (11/11/17  0654)     LOS: 7 days   Hillery Aldo  [facility] Pager [phone]. If unable to reach me by pager, please call my cell phone at [phone].  *Please refer to [email or web address], password [email or web address] to get updated schedule on who will round on this patient, as hospitalists switch teams weekly. If 7PM-7AM, please contact night-coverage at [email or web address], password [email or web address] for any overnight needs.  11/11/2017, 4:03 PM

## 2017-11-11 NOTE — Progress Notes (Signed)
CRITICAL VALUE ALERT  Critical Value:  K+2.2  Date & Time Notied:  11/11/2017 0555  Provider Notified:Ortiz

## 2017-11-12 LAB — MAGNESIUM: Magnesium: 1.6 mg/dL — ABNORMAL LOW (ref 1.7–2.4)

## 2017-11-12 LAB — CULTURE, BLOOD (ROUTINE X 2): SPECIAL REQUESTS: ADEQUATE

## 2017-11-12 LAB — GLUCOSE, CAPILLARY
GLUCOSE-CAPILLARY: 110 mg/dL — AB (ref 65–99)
Glucose-Capillary: 89 mg/dL (ref 65–99)
Glucose-Capillary: 108 mg/dL — ABNORMAL HIGH (ref 65–99)
Glucose-Capillary: 118 mg/dL — ABNORMAL HIGH (ref 65–99)

## 2017-11-12 LAB — BASIC METABOLIC PANEL
Anion gap: 10 (ref 5–15)
CALCIUM: 8.1 mg/dL — AB (ref 8.9–10.3)
CO2: 19 mmol/L — AB (ref 22–32)
CREATININE: 0.69 mg/dL (ref 0.44–1.00)
Chloride: 116 mmol/L — ABNORMAL HIGH (ref 101–111)
GFR calc Af Amer: >60 mL/min (ref >60)
GFR calc non Af Amer: >60 mL/min (ref >60)
GLUCOSE: 110 mg/dL — AB (ref 65–99)
Potassium: 3 mmol/L — ABNORMAL LOW (ref 3.5–5.1)
Sodium: 145 mmol/L (ref 135–145)

## 2017-11-12 MED ORDER — POTASSIUM CHLORIDE IN NACL 40-0.9 MEQ/L-% IV SOLN
INTRAVENOUS | Status: DC
  Administered 2017-11-12 – 2017-11-13 (×2): 100 mL/h via INTRAVENOUS

## 2017-11-12 MED ORDER — MAGNESIUM SULFATE 2 GM/50ML IV SOLN
2.0000 g | Freq: Once | INTRAVENOUS | Status: AC
  Administered 2017-11-12: 2 g via INTRAVENOUS
  Filled 2017-11-12: qty 50

## 2017-11-12 MED ORDER — POTASSIUM CHLORIDE 10 MEQ/100ML IV SOLN
10.0000 meq | INTRAVENOUS | Status: AC
  Administered 2017-11-12 (×5): 10 meq via INTRAVENOUS
  Filled 2017-11-12 (×5): qty 100

## 2017-11-12 NOTE — Progress Notes (Signed)
Spoke with Dr. Darnelle Catalanama about pt having frequent mucus, Ewings diarrhea BM causing MASD. Given verbal consent to insert [employer]

## 2017-11-12 NOTE — Progress Notes (Signed)
[facility] Progress Note     Subjective: No major issues. No nausea/vomiting. Has liquid diet ordered. Tolerated some jello per RN. Continued diarrhea.   Objective: Vital signs in last 24 hours: Temp:  [97.8 F (36.6 C)-98.1 F (36.7 C)] 98.1 F (36.7 C) (03/26 1124) Pulse Rate:  [55-71] 59 (03/26 1200) Resp:  [11-20] 13 (03/26 1000) BP: (116-174)/(51-89) 155/74 (03/26 1100) SpO2:  [87 %-100 %] 90 % (03/26 1200) Last BM Date: 11/12/17  Intake/Output from previous day: 03/25 0701 - 03/26 0700 In: 1268.8 [P.O.:200; I.V.:668.8; IV Piggyback:400] Out: 300 [Urine:300] Intake/Output this shift: No intake/output data recorded.  General appearance: alert, cooperative and no distress GI: soft, obese, epigastric somewhat tender, non distended  Lab Results:  Recent Labs    11/10/17 0358 11/11/17 0415  WBC 11.6* 12.0*  HGB 11.5* 11.5*  HCT 36.2 36.3  PLT 215 228   BMET Recent Labs    11/11/17 0415 11/12/17 1125  NA 144 145  K 2.2* 3.0*  CL 112* 116*  CO2 23 19*  GLUCOSE 160* 110*  BUN 5* <5*  CREATININE 0.86 0.69  CALCIUM 8.2* 8.1*   PT/INR No results for input(s): LABPROT, INR in the last 72 hours.  Studies/Results: Dg Abd 1 View  Addendum Date: 11/11/2017   ADDENDUM REPORT: 11/11/2017 16:38 ADDENDUM: The patient no longer has an OGT in place and the radiopaque line marked on the image is best attributed to an aortic atherosclerotic calcification. Electronically Signed   By: Marnee Spring M.D.   On: 11/11/2017 16:38   Result Date: 11/11/2017 CLINICAL DATA:  Ileus EXAM: ABDOMEN - 1 VIEW COMPARISON:  11/09/2017 FINDINGS: Dilated small and large bowel. Distension is borderline improved compared to prior, as permitted by partial coverage of the bowel. Cardiomegaly. Patient's nasogastric tube tip projects over the lower mediastinum. Deformity of the posterior right ribs, likely old fracture. These results will be called to the ordering clinician or  representative by the Radiologist Assistant, and communication documented in the PACS or zVision Dashboard. IMPRESSION: 1. History of ileus with stable to borderline improved bowel distention. 2. Interval shortening of nasogastric tube, tip over the lower mediastinum/esophagus. Electronically Signed: By: Marnee Spring M.D. On: 11/11/2017 16:28    Anti-infectives: Anti-infectives (From admission, onward)   Start     Dose/Rate Route Frequency Ordered Stop   11/09/17 0645  cefTRIAXone (ROCEPHIN) 2 g in sodium chloride 0.9 % 100 mL IVPB  Status:  Discontinued     2 g 200 mL/hr over 30 Minutes Intravenous Every 24 hours 11/08/17 0744 11/10/17 1703   11/06/17 1000  metroNIDAZOLE (FLAGYL) IVPB 500 mg  Status:  Discontinued     500 mg 100 mL/hr over 60 Minutes Intravenous Every 8 hours 11/06/17 0953 11/10/17 1705   11/06/17 0645  cefTRIAXone (ROCEPHIN) 1 g in sodium chloride 0.9 % 100 mL IVPB  Status:  Discontinued     1 g 200 mL/hr over 30 Minutes Intravenous Every 24 hours 11/06/17 0631 11/08/17 0744   10/31/17 1330  metroNIDAZOLE (FLAGYL) IVPB 500 mg  Status:  Discontinued     500 mg 100 mL/hr over 60 Minutes Intravenous Every 8 hours 10/31/17 1317 11/04/17 0733   10/31/17 1315  cefTRIAXone (ROCEPHIN) 1 g in sodium chloride 0.9 % 100 mL IVPB     1 g 200 mL/hr over 30 Minutes Intravenous Every 24 hours 10/31/17 1300 11/02/17 1554   10/31/17 1015  ciprofloxacin (CIPRO) IVPB 400 mg     400 mg 200  mL/hr over 60 Minutes Intravenous  Once 10/31/17 1013 10/31/17 1224      Assessment/Plan: Dawn Ho is a 67 yo with resolved ileus versus pSBO. Doing fair. Diet as tolerated. Surgery signing off.     LOS: 8 days    Dawn Ho 11/12/2017

## 2017-11-12 NOTE — Progress Notes (Signed)
Progress Note    Dawn Ho  ZOX:[id number] DOB: 1951-07-13  DOA: 10/31/2017 PCP: Bernerd Limbo, MD    Brief Narrative:   Chief complaint: F/U UTI and ileus.  Medical records reviewed and are as summarized below:  Dawn Ho is an 67 y.o. female with a PMH of right MCA stroke, hypertension, diabetes, and chronic diastolic CHF who was admitted 03/30/90 for evaluation of AMS, vomiting and diarrhea. She was found to have an Escherichia coli UTI which was treated with IV antibiotics. Stool studies were also positive for norovirus.Her renal function improved with IV fluids. Her hospital coursehas been complicated by the development of persistent vomiting secondary to an ileus requiring nasogastric tube decompression.  Assessment/Plan:   Principal Problems:   UTI (urinary tract infection) Treated with ceftriaxone.    Norovirus/diarrhea/abdominal distention Treated supportively with IV fluids and replacement of electrolytes. Continue contact precautions. Continues to have severe diarrhea and is at high risk for dehydration/electrolyte imbalances requiring ongoing hospitalization. Flexiseal ordered.C. Difficile negative on admission.    Ileus versus partial small bowel obstruction Required nasogastric decompression with symptomatic improvement. Surgery following but no surgical indication at present.  KUB personally reviewed and shows persistent dilated loops of bowel, but overall improved.  NG tube has been discontinued.  Tolerating clears, advance diet to FL. Repeat abdominal films in the morning.  Active Problems:   Diabetes mellitus (HCC) Currently being managed with insulin sensitive SSI and 15 units of Lantus daily. CBGs 142-187.    Hyperlipidemia Continue Lipitor.    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 Has residual left hemiparesis.    Hypokalemia/hypomagnesemia Potassium runs 8  ordered 11/11/17. Magnesium okay. Potassium still low at 3, repeat potassium runs x 4 and add potassium to IV fluids. We'll also give 2 g of magnesium sulfate.    Aspiration into airway/Acute respiratory distress Respiratory status remains stable.  Body mass index is 43.23 kg/m.   Family Communication/Anticipated D/C date and plan/Code Status   DVT prophylaxis: Heparin ordered. Code Status: Full Code.  Family Communication: No family currently at the bedside. Disposition Plan: SNF when diet can be successfully advanced.   Medical Consultants:    Gastroenterology  General surgery   Anti-Infectives:     Rocephin 3/14 > 3/17, 3/20 >3/24  Subjective:   The patient is more somnolent today and relatively nonverbal despite my efforts to engage her in a discussion about her current symptoms.  Objective:    Vitals:   11/12/17 1001 11/12/17 1100 11/12/17 1124 11/12/17 1200  BP: 139/68 (!) 155/74    Pulse: 68 68 66 (!) 59  Resp:      Temp:   98.1 F (36.7 C)   TempSrc:   Oral   SpO2:  94% (!) 87% 90%  Weight:      Height:        Intake/Output Summary (Last 24 hours) at 11/12/2017 1327 Last data filed at 11/11/2017 2200 Gross per 24 hour  Intake 1268.8 ml  Output 300 ml  Net 968.8 ml   Filed Weights   11/09/17 0500 11/10/17 0500 11/11/17 0500  Weight: 100 kg (220 lb 7.4 oz) 99 kg (218 lb 4.1 oz) 100.4 kg (221 lb 5.5 oz)    Exam: General: No acute distress. Lethargic. Cardiovascular: Heart sounds show a regular rate, and rhythm. No gallops or rubs. No murmurs. No JVD. Lungs: Diminished breath sounds. No rales, rhonchi or wheezes. Abdomen:  Distended. No masses. No hepatosplenomegaly. Neurological: Lethargic. Skin: Warm and dry. No rashes or lesions. Extremities: No clubbing or cyanosis. No edema. Pedal pulses 2+. Psychiatric: Mood and affect are flat. Insight and judgment are poor.  Data Reviewed:   I have personally reviewed following labs and imaging  studies:  Labs: Labs show the following:   Basic Metabolic Panel: Recent Labs  Lab 11/06/17 0528 11/07/17 0838 11/09/17 0421  11/10/17 0358 11/11/17 0415 11/12/17 1125  NA 142 143 152*  --  144 144 145  K 3.3* 3.0* 1.9*   < > 3.2* 2.2* 3.0*  CL 117* 114* 115*  --  112* 112* 116*  CO2 12* 16* 25  --  23 23 19*  GLUCOSE 133* 124* 141*  --  264* 160* 110*  BUN 18 15 9   --  6 5* <5*  CREATININE 1.00 0.99 0.83  --  0.75 0.86 0.69  CALCIUM 8.5* 8.6* 8.1*  --  7.7* 8.2* 8.1*  MG 2.1 2.0  --   --   --  1.7 1.6*  PHOS 3.0 3.0  --   --   --  2.9  --    < > = values in this interval not displayed.   GFR Estimated Creatinine Clearance: 72.7 mL/min (by C-G formula based on SCr of 0.69 mg/dL). Liver Function Tests: Recent Labs  Lab 11/06/17 0528 11/07/17 0838  AST 35 33  ALT 25 26  ALKPHOS 65 60  BILITOT 1.0 0.9  PROT 6.0* 6.2*  ALBUMIN 2.9* 2.9*   CBC: Recent Labs  Lab 11/06/17 0528 11/07/17 0838 11/09/17 0421 11/10/17 0358 11/11/17 0415  WBC 21.0* 19.5* 13.2* 11.6* 12.0*  NEUTROABS 17.0* 15.6*  --   --   --   HGB 13.8 14.1 11.2* 11.5* 11.5*  HCT 43.8 44.2 35.8* 36.2 36.3  MCV 89.8 90.9 90.4 90.7 90.1  PLT 298 247 243 215 228   CBG: Recent Labs  Lab 11/11/17 1055 11/11/17 1608 11/11/17 2109 11/12/17 0735 11/12/17 1120  GLUCAP 125* 118* 98 89 108*   Sepsis Labs: Recent Labs  Lab 11/06/17 0900 11/07/17 0838 11/09/17 0421 11/10/17 0358 11/11/17 0415  WBC  --  19.5* 13.2* 11.6* 12.0*  LATICACIDVEN 1.0  --   --   --   --     Microbiology Recent Results (from the past 240 hour(s))  C difficile quick scan w PCR reflex     Status: None   Collection Time: 11/02/17  6:34 PM  Result Value Ref Range Status   C Diff antigen NEGATIVE NEGATIVE Final   C Diff toxin NEGATIVE NEGATIVE Final   C Diff interpretation No C. difficile detected.  Final    Comment: Performed at [facility], [address], [place], [place] [address]  C difficile quick scan w PCR reflex      Status: None   Collection Time: 11/06/17  3:45 AM  Result Value Ref Range Status   C Diff antigen NEGATIVE NEGATIVE Final   C Diff toxin NEGATIVE NEGATIVE Final   C Diff interpretation No C. difficile detected.  Final    Comment: Performed at [facility], [address] [place], [place] [address]  Gastrointestinal Panel by PCR , Stool     Status: Abnormal   Collection Time: 11/06/17  3:45 AM  Result Value Ref Range Status   Campylobacter species NOT DETECTED NOT DETECTED Final   Plesimonas shigelloides NOT DETECTED NOT DETECTED Final   Salmonella species NOT DETECTED NOT DETECTED Final  Yersinia enterocolitica NOT DETECTED NOT DETECTED Final   Vibrio species NOT DETECTED NOT DETECTED Final   Vibrio cholerae NOT DETECTED NOT DETECTED Final   Enteroaggregative E coli (EAEC) NOT DETECTED NOT DETECTED Final   Enteropathogenic E coli (EPEC) NOT DETECTED NOT DETECTED Final   Enterotoxigenic E coli (ETEC) NOT DETECTED NOT DETECTED Final   Shiga like toxin producing E coli (STEC) NOT DETECTED NOT DETECTED Final   Shigella/Enteroinvasive E coli (EIEC) NOT DETECTED NOT DETECTED Final   Cryptosporidium NOT DETECTED NOT DETECTED Final   Cyclospora cayetanensis NOT DETECTED NOT DETECTED Final   Entamoeba histolytica NOT DETECTED NOT DETECTED Final   Giardia lamblia NOT DETECTED NOT DETECTED Final   Adenovirus F40/41 NOT DETECTED NOT DETECTED Final   Astrovirus NOT DETECTED NOT DETECTED Final   Norovirus GI/GII DETECTED (A) NOT DETECTED Final    Comment: RESULT CALLED TO, READ BACK BY AND VERIFIED WITH: TIFFANY ROBERTS AT 1827 ON 11/14/2017 JJB    Rotavirus A NOT DETECTED NOT DETECTED Final   Sapovirus (I, II, IV, and V) NOT DETECTED NOT DETECTED Final    Comment: Performed at [facility], [address] [place], [place] [address]  Culture, blood (Routine X 2) w Reflex to ID Panel     Status: Abnormal   Collection Time: 11/06/17  6:49 AM  Result Value Ref Range Status    Specimen Description   Final    BLOOD LEFT HAND Performed at [facility], [address] [place], [place] [address]    Special Requests   Final    BOTTLES DRAWN AEROBIC ONLY Blood Culture adequate volume Performed at [facility], [address], [place], [place] [address]    Culture  Setup Time   Final    GRAM POSITIVE COCCI Gram Stain Report Called to,Read Back By and Verified With: MCDANIEL,M. AT 1720 ON 11/07/2017 BY EVA AEROBIC BOTTLE ONLY Performed at [facility] CRITICAL RESULT CALLED TO, READ BACK BY AND VERIFIED WITH: B. ACORD,RN 0056 11/08/2017 T. TYSOR    Culture (A)  Final    STAPHYLOCOCCUS SPECIES (COAGULASE NEGATIVE) SUSCEPTIBILITIES PERFORMED ON PREVIOUS CULTURE WITHIN THE LAST 5 DAYS. Performed at [facility], [address] [place], [place] [address]    Report Status 11/11/2017 FINAL  Final  Blood Culture ID Panel (Reflexed)     Status: None   Collection Time: 11/06/17  6:49 AM  Result Value Ref Range Status   Enterococcus species NOT DETECTED NOT DETECTED Final   Listeria monocytogenes NOT DETECTED NOT DETECTED Final   Staphylococcus species NOT DETECTED NOT DETECTED Final   Staphylococcus aureus NOT DETECTED NOT DETECTED Final   Streptococcus species NOT DETECTED NOT DETECTED Final   Streptococcus agalactiae NOT DETECTED NOT DETECTED Final   Streptococcus pneumoniae NOT DETECTED NOT DETECTED Final   Streptococcus pyogenes NOT DETECTED NOT DETECTED Final   Acinetobacter baumannii NOT DETECTED NOT DETECTED Final   Enterobacteriaceae species NOT DETECTED NOT DETECTED Final   Enterobacter cloacae complex NOT DETECTED NOT DETECTED Final   Escherichia coli NOT DETECTED NOT DETECTED Final   Klebsiella oxytoca NOT DETECTED NOT DETECTED Final   Klebsiella pneumoniae NOT DETECTED NOT DETECTED Final   Proteus species NOT DETECTED NOT DETECTED Final   Serratia marcescens NOT DETECTED NOT DETECTED Final   Haemophilus influenzae NOT DETECTED NOT DETECTED  Final   Neisseria meningitidis NOT DETECTED NOT DETECTED Final   Pseudomonas aeruginosa NOT DETECTED NOT DETECTED Final   Candida albicans NOT DETECTED NOT DETECTED Final  Candida glabrata NOT DETECTED NOT DETECTED Final   Candida krusei NOT DETECTED NOT DETECTED Final   Candida parapsilosis NOT DETECTED NOT DETECTED Final   Candida tropicalis NOT DETECTED NOT DETECTED Final  Culture, blood (Routine X 2) w Reflex to ID Panel     Status: Abnormal   Collection Time: 11/06/17  7:56 AM  Result Value Ref Range Status   Specimen Description   Final    BLOOD LEFT ARM Performed at [facility], [address], [place], [place] [address]    Special Requests   Final    BOTTLES DRAWN AEROBIC AND ANAEROBIC Blood Culture adequate volume Performed at [facility], [address], [place], [place] [address]    Culture  Setup Time   Final    GRAM POSITIVE COCCI Gram Stain Report Called to,Read Back By and Verified With: DANIELS,J. AT 2229 ON 11/09/2017 BY EVA AEROBIC BOTTLE ONLY Performed at [facility] CRITICAL RESULT CALLED TO, READ BACK BY AND VERIFIED WITH: Rexene Alberts 3244 11/10/2017 Girtha Hake Performed at [facility], [address] [place], [place] [address]    Culture STAPHYLOCOCCUS SPECIES (COAGULASE NEGATIVE) (A)  Final   Report Status 11/12/2017 FINAL  Final   Organism ID, Bacteria STAPHYLOCOCCUS SPECIES (COAGULASE NEGATIVE)  Final      Susceptibility   Staphylococcus species (coagulase negative) - MIC*    CIPROFLOXACIN >=8 RESISTANT Resistant     ERYTHROMYCIN 0.5 SENSITIVE Sensitive     GENTAMICIN <=0.5 SENSITIVE Sensitive     OXACILLIN 0.5 RESISTANT Resistant     TETRACYCLINE <=1 SENSITIVE Sensitive     VANCOMYCIN 1 SENSITIVE Sensitive     TRIMETH/SULFA 20 SENSITIVE Sensitive     CLINDAMYCIN <=0.25 SENSITIVE Sensitive     RIFAMPIN <=0.5 SENSITIVE Sensitive     Inducible Clindamycin NEGATIVE Sensitive     * STAPHYLOCOCCUS SPECIES (COAGULASE NEGATIVE)    Procedures  and diagnostic studies:  Dg Abd 1 View  Addendum Date: 11/11/2017   ADDENDUM REPORT: 11/11/2017 16:38 ADDENDUM: The patient no longer has an OGT in place and the radiopaque line marked on the image is best attributed to an aortic atherosclerotic calcification. Electronically Signed   By: Marnee Spring M.D.   On: 11/11/2017 16:38   Result Date: 11/11/2017 CLINICAL DATA:  Ileus EXAM: ABDOMEN - 1 VIEW COMPARISON:  11/09/2017 FINDINGS: Dilated small and large bowel. Distension is borderline improved compared to prior, as permitted by partial coverage of the bowel. Cardiomegaly. Patient's nasogastric tube tip projects over the lower mediastinum. Deformity of the posterior right ribs, likely old fracture. These results will be called to the ordering clinician or representative by the Radiologist Assistant, and communication documented in the PACS or zVision Dashboard. IMPRESSION: 1. History of ileus with stable to borderline improved bowel distention. 2. Interval shortening of nasogastric tube, tip over the lower mediastinum/esophagus. Electronically Signed: By: Marnee Spring M.D. On: 11/11/2017 16:28    Medications:   . atorvastatin  20 mg Oral q1800  . baclofen  5 mg Oral BID  . clopidogrel  75 mg Oral Daily  . cyanocobalamin  1,000 mcg Intramuscular Q30 days  . heparin  5,000 Units Subcutaneous Q8H  . insulin aspart  0-5 Units Subcutaneous QHS  . insulin aspart  0-9 Units Subcutaneous TID WC  . insulin glargine  15 Units Subcutaneous QHS  . levothyroxine  125 mcg Oral QAC breakfast  . lidocaine  1 application Topical Once  . mouth rinse  15 mL Mouth Rinse BID  .  metoprolol succinate  50 mg Oral Daily  . pantoprazole (PROTONIX) IV  40 mg Intravenous Q24H   Continuous Infusions: . 0.9 % NaCl with KCl 40 mEq / L 100 mL/hr (11/12/17 0845)  . potassium chloride 10 mEq (11/12/17 1250)     LOS: 8 days   Hillery Aldo  [facility] Pager [phone]. If unable to reach me by  pager, please call my cell phone at [phone].  *Please refer to [email or web address], password [email or web address] to get updated schedule on who will round on this patient, as hospitalists switch teams weekly. If 7PM-7AM, please contact night-coverage at [email or web address], password [email or web address] for any overnight needs.  11/12/2017, 1:27 PM

## 2017-11-13 ENCOUNTER — Inpatient Hospital Stay (HOSPITAL_COMMUNITY): Payer: Medicare Other

## 2017-11-13 LAB — GLUCOSE, CAPILLARY
GLUCOSE-CAPILLARY: 144 mg/dL — AB (ref 65–99)
GLUCOSE-CAPILLARY: 142 mg/dL — AB (ref 65–99)
Glucose-Capillary: 78 mg/dL (ref 65–99)
Glucose-Capillary: 160 mg/dL — ABNORMAL HIGH (ref 65–99)

## 2017-11-13 LAB — BASIC METABOLIC PANEL
Anion gap: 10 (ref 5–15)
CHLORIDE: 121 mmol/L — AB (ref 101–111)
CO2: 17 mmol/L — AB (ref 22–32)
Calcium: 8 mg/dL — ABNORMAL LOW (ref 8.9–10.3)
Creatinine, Ser: 0.76 mg/dL (ref 0.44–1.00)
GFR calc Af Amer: >60 mL/min (ref >60)
GLUCOSE: 88 mg/dL (ref 65–99)
POTASSIUM: 3.7 mmol/L (ref 3.5–5.1)
Sodium: 148 mmol/L — ABNORMAL HIGH (ref 135–145)

## 2017-11-13 LAB — MAGNESIUM: Magnesium: 1.9 mg/dL (ref 1.7–2.4)

## 2017-11-13 MED ORDER — LOPERAMIDE HCL 2 MG PO CAPS
2.0000 mg | ORAL_CAPSULE | Freq: Once | ORAL | Status: AC
  Administered 2017-11-13: 2 mg via ORAL
  Filled 2017-11-13: qty 1

## 2017-11-13 MED ORDER — POTASSIUM CHLORIDE IN NACL 40-0.9 MEQ/L-% IV SOLN
INTRAVENOUS | Status: AC
  Administered 2017-11-13: 100 mL/h via INTRAVENOUS

## 2017-11-13 MED ORDER — GLUCERNA SHAKE PO LIQD
237.0000 mL | Freq: Three times a day (TID) | ORAL | Status: DC
  Administered 2017-11-13 (×2): 237 mL via ORAL

## 2017-11-13 NOTE — Progress Notes (Signed)
Pt transferred to room 212 for med surg status. [facility]

## 2017-11-13 NOTE — Care Management Important Message (Signed)
Important Message  Patient Details  Name: Dawn Ho MRN: [id number] Date of Birth: 01-20-1951   Medicare Important Message Given:  Yes    Imanuel Pruiett, Chrystine OilerSharley Diane, RN 11/13/2017, 2:50 PM

## 2017-11-13 NOTE — Clinical Social Work Note (Deleted)
Clinical Social Work Assessment  Patient Details  Name: Dawn Ho MRN: [id number] Date of Birth: [birth year]  Date of referral:  11/13/17               Reason for consult:  Facility Placement, Discharge Planning                Permission sought to share information with:  Facility Medical sales representativeContact Representative, Family Supports Permission granted to share information::  Yes, Verbal Permission Granted(Son gave verbal permission. pt not oriented x4 )  Name::     Katharine LookBilly Thomas and Yvonne KendallWanda Stevens  Agency::  yes  Relationship::  son and daughter  Contact Information:  yes  Housing/Transportation Living arrangements for the past 2 months:  Skilled Nursing Facility([facility]) Source of Information:  Adult Children Patient Interpreter Needed:  None Criminal Activity/Legal Involvement Pertinent to Current Situation/Hospitalization:  No - Comment as needed Significant Relationships:  Adult Children Lives with:  Facility Resident Do you feel safe going back to the place where you live?  Yes Need for family participation in patient care:  Yes (Comment)  Care giving concerns:  [facility]   Social Worker assessment / plan: Pt is a 67 year old female admitted from [facility]. [facility] Plan is for return there again when stable.   Employment status:  Disabled (Comment on whether or not currently receiving Disability)(receiving disability payments) Database administratornsurance information:  Managed Medicare PT Recommendations:  Skilled Nursing Facility, Not assessed at this time Information / Referral to community resources:  (NA)  Patient/Family's Response to care: pt and family accepting of care  Patient/Family's Understanding of and Emotional Response to Diagnosis, Current Treatment, and Prognosis:  Pt's family appear to have a good understanding of pt's condition and treatment needs. No emotional distress  identified.  Emotional Assessment Appearance:  Appears older than stated age Attitude/Demeanor/Rapport:  (Appropriate) Affect (typically observed):  Appropriate Orientation:  Oriented to Self Alcohol / Substance use:  Not Applicable Psych involvement (Current and /or in the community):  No (Comment)  Discharge Needs  Concerns to be addressed:  No discharge needs identified Readmission within the last 30 days:  Yes Current discharge risk:  None Barriers to Discharge:  Continued Medical Work up   MedtronicKathleen Jenella Craigie, LCSW 11/13/2017, 11:05 AM

## 2017-11-13 NOTE — Progress Notes (Signed)
Initial Nutrition Assessment  DOCUMENTATION CODES:   Morbid obesity  INTERVENTION:   - Glucerna Shake po TID, each supplement provides 220 kcal and 10 grams of protein  - Encourage PO intake  NUTRITION DIAGNOSIS:   Inadequate oral intake related to poor appetite, other (see comment)(pt on full liquid diet) as evidenced by per patient/family report.  GOAL:   Patient will meet greater than or equal to 90% of their needs  MONITOR:   PO intake, Supplement acceptance, I & O's, Labs, Skin  REASON FOR ASSESSMENT:   Low Braden    ASSESSMENT:   67 year old female with PMH significant for right MCA stroke, HLD, HTN, diabetes mellitus, and CHF. Pt admitted to hospital from SNF for AMS, vomiting, and diarrhea. Pt found to have UTI that was treated with IV antibiotics. Stool samples positive for norovirus. Abdominal imagine showed partial SBO versus ileus. NGT placed for decompression but has been d/c.   11/12/17 - diet advanced from NPO to full liquid, flexi-seal placed  Spoke with pt at bedside who reports currently having a poor appetite. Pt states that PTA, she had a "great appetite" and ate 3 meals daily. RD noted unfinished lunch meal tray of full liquids in pt's room at time of visit. Pt had completed 100% of vanilla pudding, 25% of soup, 25% of jello, and 100% of unsweetened tea. Pt states her appetite is "coming back."  Pt is unsure if she has lost weight. Pt states her UBW is 160 lbs. RD unsure if this is an accurate report as pt's weight history since 2012 indicates pt has weighed over 200 lbs. Noted pt is +8 lbs since admission and suspect it is due to fluid shifts as pt is +11.9 L since admission.  Spoke with RN who states pt did well at breakfast. Both pt and RN agreeable to pt receiving Glucerna TID.  Medications reviewed and include: 1000 mcg vitamin B-12 every 30 days, sliding scale Novolog, 15 units Lantus daily, 125 mcg levothyroxine daily, 40 mg Protonix daily, IV  KCl  Labs reviewed: sodium 148, BUN <5, K/Mg/P now all WNL CBG's: 160, 78, 118, 110 x 24 hours  NUTRITION - FOCUSED PHYSICAL EXAM:    Most Recent Value  Orbital Region  No depletion  Upper Arm Region  No depletion  Thoracic and Lumbar Region  No depletion  Buccal Region  No depletion  Temple Region  No depletion  Clavicle Bone Region  No depletion  Clavicle and Acromion Bone Region  No depletion  Scapular Bone Region  Unable to assess  Dorsal Hand  No depletion  Patellar Region  No depletion  Anterior Thigh Region  No depletion  Posterior Calf Region  No depletion  Edema (RD Assessment)  Mild  Hair  Reviewed  Eyes  Reviewed  Mouth  Reviewed  Skin  Reviewed  Nails  Reviewed       Diet Order:  Diet full liquid Room service appropriate? Yes; Fluid consistency: Thin  EDUCATION NEEDS:   No education needs have been identified at this time  Skin:  Skin Assessment: Reviewed RN Assessment(MASD to buttocks, groin, perineum)  Last BM:  11/13/17 large type 7  Height:   Ht Readings from Last 1 Encounters:  11/06/17 5' (1.524 m)    Weight:   Wt Readings from Last 1 Encounters:  11/13/17 216 lb 4.3 oz (98.1 kg)    Ideal Body Weight:  45.5 kg  BMI:  Body mass index is 42.24 kg/m.  Estimated Nutritional Needs:  Kcal:  1700-1900 kcal/day  Protein:  90-105 grams/day  Fluid:  1.7-1.9 L/day    Earma Reading, MS, RD, LDN Pager: [phone] Weekend/After Hours: [phone]

## 2017-11-13 NOTE — Progress Notes (Signed)
[facility] PROGRESS NOTE  Dawn Ho ZOX:[id number] DOB: 1951-03-11 DOA: 10/31/2017 PCP: Bernerd Limbo, MD  Brief summary   67 y.o. female with a PMH of right MCA stroke, hypertension, diabetes, and chronic diastolic CHF who was admitted 03/30/90 for evaluation of AMS, vomiting and diarrhea. She was found to have an Escherichia coli UTI which was treated with IV antibiotics. Stool studies were also positive for norovirus.Her renal function improved with IV fluids. Her hospital coursehas been complicated by the development of persistent vomiting secondary to an ileus requiring nasogastric tube decompression.    Assessment/Plan:  UTI (urinary tract infection). Treated with ceftriaxone.  Norovirus/diarrhea/abdominal distention. Treated supportively with IV fluids and replacement of electrolytes. Continue contact precautions. Continues to have severe diarrhea and is at high risk for dehydration/electrolyte imbalances requiring ongoing hospitalization. Flexiseal in place. C. Difficile negative on admission. still with high output diarrhea, Will try small dose of imodium. May ned codeine if persistent   Ileus versus partial small bowel obstruction. Required nasogastric decompression with symptomatic improvement. Surgery following but no surgical indication at present.  KUB personally reviewed and shows persistent dilated loops of bowel, but overall improved.  NG tube has been discontinued.  Tolerating clears, advance diet   Diabetes mellitus (HCC) Currently being managed with insulin sensitive SSI and 15 units of Lantus daily. CBGs 142-187.  Hyperlipidemia Continue Lipitor.  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 Continue Plavix, statin and risk factor modification. Has residual left hemiparesis.  Hypokalemia/hypomagnesemia Potassium runs 8 ordered 11/11/17. Magnesium okay. Potassium still low at 3, repeat potassium runs x 4 and add potassium to IV  fluids. We'll also give 2 g of magnesium sulfate.  Aspiration into airway/Acute respiratory distress Respiratory status remains stable.  Body mass index is 43.23 kg/m.    Code Status: full Family Communication: d/w patient, RN (indicate person spoken with, relationship, and if by phone, the number) Disposition Plan: pend clinical improvement. Diarrhea    Consultants:  Surgery   Procedures:  NGT  Antibiotics: Anti-infectives (From admission, onward)   Start     Dose/Rate Route Frequency Ordered Stop   11/09/17 0645  cefTRIAXone (ROCEPHIN) 2 g in sodium chloride 0.9 % 100 mL IVPB  Status:  Discontinued     2 g 200 mL/hr over 30 Minutes Intravenous Every 24 hours 11/08/17 0744 11/10/17 1703   11/06/17 1000  metroNIDAZOLE (FLAGYL) IVPB 500 mg  Status:  Discontinued     500 mg 100 mL/hr over 60 Minutes Intravenous Every 8 hours 11/06/17 0953 11/10/17 1705   11/06/17 0645  cefTRIAXone (ROCEPHIN) 1 g in sodium chloride 0.9 % 100 mL IVPB  Status:  Discontinued     1 g 200 mL/hr over 30 Minutes Intravenous Every 24 hours 11/06/17 0631 11/08/17 0744   10/31/17 1330  metroNIDAZOLE (FLAGYL) IVPB 500 mg  Status:  Discontinued     500 mg 100 mL/hr over 60 Minutes Intravenous Every 8 hours 10/31/17 1317 11/04/17 0733   10/31/17 1315  cefTRIAXone (ROCEPHIN) 1 g in sodium chloride 0.9 % 100 mL IVPB     1 g 200 mL/hr over 30 Minutes Intravenous Every 24 hours 10/31/17 1300 11/02/17 1554   10/31/17 1015  ciprofloxacin (CIPRO) IVPB 400 mg     400 mg 200 mL/hr over 60 Minutes Intravenous  Once 10/31/17 1013 10/31/17 1224        (indicate start date, and stop date if known)  HPI/Subjective: Alert. Reports feeling better. Still diarrhea. Has rectal tube.  No acute dyspnea. No vomiting. NGT removed    Objective: Vitals:   11/13/17 0700 11/13/17 0800  BP: (!) 180/82 (!) 146/78  Pulse: 73 (!) 56  Resp: 16 18  Temp:  97.9 F (36.6 C)  SpO2: 95% 94%    Intake/Output Summary (Last  24 hours) at 11/13/2017 0902 Last data filed at 11/13/2017 0630 Gross per 24 hour  Intake 1450 ml  Output 1600 ml  Net -150 ml   Filed Weights   11/10/17 0500 11/11/17 0500 11/13/17 0600  Weight: 99 kg (218 lb 4.1 oz) 100.4 kg (221 lb 5.5 oz) 98.1 kg (216 lb 4.3 oz)    Exam:   General:  No distress   Cardiovascular: s1,s2 rrr  Respiratory: CTA BL  Abdomen: soft, nt, nd   Musculoskeletal: no leg edema    Data Reviewed: Basic Metabolic Panel: Recent Labs  Lab 11/07/17 0838 11/09/17 0421 11/09/17 1548 11/10/17 0358 11/11/17 0415 11/12/17 1125 11/13/17 0416  NA 143 152*  --  144 144 145 148*  K 3.0* 1.9* 3.0* 3.2* 2.2* 3.0* 3.7  CL 114* 115*  --  112* 112* 116* 121*  CO2 16* 25  --  23 23 19* 17*  GLUCOSE 124* 141*  --  264* 160* 110* 88  BUN 15 9  --  6 5* <5* <5*  CREATININE 0.99 0.83  --  0.75 0.86 0.69 0.76  CALCIUM 8.6* 8.1*  --  7.7* 8.2* 8.1* 8.0*  MG 2.0  --   --   --  1.7 1.6* 1.9  PHOS 3.0  --   --   --  2.9  --   --    Liver Function Tests: Recent Labs  Lab 11/07/17 0838  AST 33  ALT 26  ALKPHOS 60  BILITOT 0.9  PROT 6.2*  ALBUMIN 2.9*   No results for input(s): LIPASE, AMYLASE in the last 168 hours. No results for input(s): AMMONIA in the last 168 hours. CBC: Recent Labs  Lab 11/07/17 0838 11/09/17 0421 11/10/17 0358 11/11/17 0415  WBC 19.5* 13.2* 11.6* 12.0*  NEUTROABS 15.6*  --   --   --   HGB 14.1 11.2* 11.5* 11.5*  HCT 44.2 35.8* 36.2 36.3  MCV 90.9 90.4 90.7 90.1  PLT 247 243 215 228   Cardiac Enzymes: No results for input(s): CKTOTAL, CKMB, CKMBINDEX, TROPONINI in the last 168 hours. BNP (last 3 results) No results for input(s): BNP in the last 8760 hours.  ProBNP (last 3 results) No results for input(s): PROBNP in the last 8760 hours.  CBG: Recent Labs  Lab 11/12/17 0735 11/12/17 1120 11/12/17 1656 11/12/17 2055 11/13/17 0802  GLUCAP 89 108* 110* 118* 78    Recent Results (from the past 240 hour(s))  C  difficile quick scan w PCR reflex     Status: None   Collection Time: 11/06/17  3:45 AM  Result Value Ref Range Status   C Diff antigen NEGATIVE NEGATIVE Final   C Diff toxin NEGATIVE NEGATIVE Final   C Diff interpretation No C. difficile detected.  Final    Comment: Performed at [facility], [address] [place], [place] [address]  Gastrointestinal Panel by PCR , Stool     Status: Abnormal   Collection Time: 11/06/17  3:45 AM  Result Value Ref Range Status   Campylobacter species NOT DETECTED NOT DETECTED Final   Plesimonas shigelloides NOT DETECTED NOT DETECTED Final   Salmonella species NOT DETECTED NOT DETECTED Final   Yersinia  enterocolitica NOT DETECTED NOT DETECTED Final   Vibrio species NOT DETECTED NOT DETECTED Final   Vibrio cholerae NOT DETECTED NOT DETECTED Final   Enteroaggregative E coli (EAEC) NOT DETECTED NOT DETECTED Final   Enteropathogenic E coli (EPEC) NOT DETECTED NOT DETECTED Final   Enterotoxigenic E coli (ETEC) NOT DETECTED NOT DETECTED Final   Shiga like toxin producing E coli (STEC) NOT DETECTED NOT DETECTED Final   Shigella/Enteroinvasive E coli (EIEC) NOT DETECTED NOT DETECTED Final   Cryptosporidium NOT DETECTED NOT DETECTED Final   Cyclospora cayetanensis NOT DETECTED NOT DETECTED Final   Entamoeba histolytica NOT DETECTED NOT DETECTED Final   Giardia lamblia NOT DETECTED NOT DETECTED Final   Adenovirus F40/41 NOT DETECTED NOT DETECTED Final   Astrovirus NOT DETECTED NOT DETECTED Final   Norovirus GI/GII DETECTED (A) NOT DETECTED Final    Comment: RESULT CALLED TO, READ BACK BY AND VERIFIED WITH: TIFFANY ROBERTS AT 1827 ON 11/14/2017 JJB    Rotavirus A NOT DETECTED NOT DETECTED Final   Sapovirus (I, II, IV, and V) NOT DETECTED NOT DETECTED Final    Comment: Performed at [facility], [address] [place], [place] [address]  Culture, blood (Routine X 2) w Reflex to ID Panel     Status: Abnormal   Collection Time: 11/06/17  6:49 AM   Result Value Ref Range Status   Specimen Description   Final    BLOOD LEFT HAND Performed at [facility], [address] [place], [place] [address]    Special Requests   Final    BOTTLES DRAWN AEROBIC ONLY Blood Culture adequate volume Performed at [facility], [address], [place], [place] [address]    Culture  Setup Time   Final    GRAM POSITIVE COCCI Gram Stain Report Called to,Read Back By and Verified With: MCDANIEL,M. AT 1720 ON 11/07/2017 BY EVA AEROBIC BOTTLE ONLY Performed at [facility] CRITICAL RESULT CALLED TO, READ BACK BY AND VERIFIED WITH: B. ACORD,RN 0056 11/08/2017 T. TYSOR    Culture (A)  Final    STAPHYLOCOCCUS SPECIES (COAGULASE NEGATIVE) SUSCEPTIBILITIES PERFORMED ON PREVIOUS CULTURE WITHIN THE LAST 5 DAYS. Performed at [facility], [address], [place], [place] [address]    Report Status 11/11/2017 FINAL  Final  Blood Culture ID Panel (Reflexed)     Status: None   Collection Time: 11/06/17  6:49 AM  Result Value Ref Range Status   Enterococcus species NOT DETECTED NOT DETECTED Final   Listeria monocytogenes NOT DETECTED NOT DETECTED Final   Staphylococcus species NOT DETECTED NOT DETECTED Final   Staphylococcus aureus NOT DETECTED NOT DETECTED Final   Streptococcus species NOT DETECTED NOT DETECTED Final   Streptococcus agalactiae NOT DETECTED NOT DETECTED Final   Streptococcus pneumoniae NOT DETECTED NOT DETECTED Final   Streptococcus pyogenes NOT DETECTED NOT DETECTED Final   Acinetobacter baumannii NOT DETECTED NOT DETECTED Final   Enterobacteriaceae species NOT DETECTED NOT DETECTED Final   Enterobacter cloacae complex NOT DETECTED NOT DETECTED Final   Escherichia coli NOT DETECTED NOT DETECTED Final   Klebsiella oxytoca NOT DETECTED NOT DETECTED Final   Klebsiella pneumoniae NOT DETECTED NOT DETECTED Final   Proteus species NOT DETECTED NOT DETECTED Final   Serratia marcescens NOT DETECTED NOT DETECTED Final   Haemophilus  influenzae NOT DETECTED NOT DETECTED Final   Neisseria meningitidis NOT DETECTED NOT DETECTED Final   Pseudomonas aeruginosa NOT DETECTED NOT DETECTED Final   Candida albicans NOT DETECTED NOT DETECTED Final  Candida glabrata NOT DETECTED NOT DETECTED Final   Candida krusei NOT DETECTED NOT DETECTED Final   Candida parapsilosis NOT DETECTED NOT DETECTED Final   Candida tropicalis NOT DETECTED NOT DETECTED Final  Culture, blood (Routine X 2) w Reflex to ID Panel     Status: Abnormal   Collection Time: 11/06/17  7:56 AM  Result Value Ref Range Status   Specimen Description   Final    BLOOD LEFT ARM Performed at [facility], [address], [place], [place] [address]    Special Requests   Final    BOTTLES DRAWN AEROBIC AND ANAEROBIC Blood Culture adequate volume Performed at [facility], [address], [place], [place] [address]    Culture  Setup Time   Final    GRAM POSITIVE COCCI Gram Stain Report Called to,Read Back By and Verified With: DANIELS,J. AT 2229 ON 11/09/2017 BY EVA AEROBIC BOTTLE ONLY Performed at [facility] CRITICAL RESULT CALLED TO, READ BACK BY AND VERIFIED WITH: Rexene Alberts 3235 11/10/2017 Girtha Hake Performed at [facility], [address] [place], [place] [address]    Culture STAPHYLOCOCCUS SPECIES (COAGULASE NEGATIVE) (A)  Final   Report Status 11/12/2017 FINAL  Final   Organism ID, Bacteria STAPHYLOCOCCUS SPECIES (COAGULASE NEGATIVE)  Final      Susceptibility   Staphylococcus species (coagulase negative) - MIC*    CIPROFLOXACIN >=8 RESISTANT Resistant     ERYTHROMYCIN 0.5 SENSITIVE Sensitive     GENTAMICIN <=0.5 SENSITIVE Sensitive     OXACILLIN 0.5 RESISTANT Resistant     TETRACYCLINE <=1 SENSITIVE Sensitive     VANCOMYCIN 1 SENSITIVE Sensitive     TRIMETH/SULFA 20 SENSITIVE Sensitive     CLINDAMYCIN <=0.25 SENSITIVE Sensitive     RIFAMPIN <=0.5 SENSITIVE Sensitive     Inducible Clindamycin NEGATIVE Sensitive     * STAPHYLOCOCCUS SPECIES  (COAGULASE NEGATIVE)     Studies: Dg Abd 1 View  Addendum Date: 11/11/2017   ADDENDUM REPORT: 11/11/2017 16:38 ADDENDUM: The patient no longer has an OGT in place and the radiopaque line marked on the image is best attributed to an aortic atherosclerotic calcification. Electronically Signed   By: Marnee Spring M.D.   On: 11/11/2017 16:38   Result Date: 11/11/2017 CLINICAL DATA:  Ileus EXAM: ABDOMEN - 1 VIEW COMPARISON:  11/09/2017 FINDINGS: Dilated small and large bowel. Distension is borderline improved compared to prior, as permitted by partial coverage of the bowel. Cardiomegaly. Patient's nasogastric tube tip projects over the lower mediastinum. Deformity of the posterior right ribs, likely old fracture. These results will be called to the ordering clinician or representative by the Radiologist Assistant, and communication documented in the PACS or zVision Dashboard. IMPRESSION: 1. History of ileus with stable to borderline improved bowel distention. 2. Interval shortening of nasogastric tube, tip over the lower mediastinum/esophagus. Electronically Signed: By: Marnee Spring M.D. On: 11/11/2017 16:28    Scheduled Meds: . atorvastatin  20 mg Oral q1800  . baclofen  5 mg Oral BID  . clopidogrel  75 mg Oral Daily  . cyanocobalamin  1,000 mcg Intramuscular Q30 days  . heparin  5,000 Units Subcutaneous Q8H  . insulin aspart  0-5 Units Subcutaneous QHS  . insulin aspart  0-9 Units Subcutaneous TID WC  . insulin glargine  15 Units Subcutaneous QHS  . levothyroxine  125 mcg Oral QAC breakfast  . lidocaine  1 application Topical Once  . mouth rinse  15 mL Mouth Rinse BID  . metoprolol succinate  50 mg Oral Daily  . pantoprazole (PROTONIX) IV  40 mg Intravenous Q24H   Continuous Infusions: . 0.9 % NaCl with KCl 40 mEq / L 100 mL/hr (11/13/17 0040)    Principal Problem:   UTI (urinary tract infection) Active Problems:   Diabetes mellitus (HCC)   Hyperlipidemia   Cerebral infarction  due to embolism of cerebral artery (HCC)   Hypokalemia   Diarrhea   Abdominal distension   Leukocytosis   Lung crackles   Ileus (HCC)   Aspiration into airway   Acute respiratory distress   Hypomagnesemia    Time spent: >35 minutes     Esperanza Sheets  [facility] Pager [phone]. If 7PM-7AM, please contact night-coverage at [email or web address], password [facility] 11/13/2017, 9:02 AM  LOS: 9 days

## 2017-11-13 NOTE — Discharge Instructions (Signed)

## 2017-11-13 NOTE — Progress Notes (Signed)
Updated nurse, Gear[facility]ine BienenstockBrandy at [facility]  [employer] [place] Severs, RN

## 2017-11-14 DIAGNOSIS — R0603 Acute respiratory distress: Secondary | ICD-10-CM

## 2017-11-14 DIAGNOSIS — E876 Hypokalemia: Secondary | ICD-10-CM

## 2017-11-14 DIAGNOSIS — N179 Acute kidney failure, unspecified: Secondary | ICD-10-CM

## 2017-11-14 DIAGNOSIS — I639 Cerebral infarction, unspecified: Secondary | ICD-10-CM

## 2017-11-14 LAB — GLUCOSE, CAPILLARY
Glucose-Capillary: 133 mg/dL — ABNORMAL HIGH (ref 65–99)
Glucose-Capillary: 167 mg/dL — ABNORMAL HIGH (ref 65–99)

## 2017-11-14 MED ORDER — BACLOFEN 10 MG PO TABS
10.0000 mg | ORAL_TABLET | Freq: Two times a day (BID) | ORAL | 0 refills | Status: DC | PRN
Start: 2017-11-14 — End: 2019-02-21

## 2017-11-14 MED ORDER — CYANOCOBALAMIN 1000 MCG/ML IJ SOLN: 1000.0000 ug | INTRAMUSCULAR | 0 refills | Status: DC

## 2017-11-14 MED ORDER — INSULIN GLARGINE 100 UNIT/ML ~~LOC~~ SOLN: 15.0000 [IU] | Freq: Every day | SUBCUTANEOUS | 11 refills | Status: DC

## 2017-11-14 MED ORDER — GLUCERNA SHAKE PO LIQD: 237.0000 mL | Freq: Three times a day (TID) | ORAL | 0 refills | Status: DC

## 2017-11-14 MED ORDER — INSULIN REGULAR HUMAN 100 UNIT/ML IJ SOLN
5.0000 [IU] | Freq: Three times a day (TID) | INTRAMUSCULAR | 11 refills | Status: DC
Start: 2017-11-14 — End: 2020-09-12

## 2017-11-14 MED ORDER — OXYCODONE HCL 5 MG PO TABS: 5.0000 mg | ORAL_TABLET | Freq: Two times a day (BID) | ORAL | 0 refills | Status: DC

## 2017-11-14 MED ORDER — LORAZEPAM 0.5 MG PO TABS: ORAL_TABLET | ORAL | 0 refills | Status: DC

## 2017-11-14 MED ORDER — INSULIN REGULAR HUMAN 100 UNIT/ML IJ SOLN: 8.0000 [IU] | Freq: Three times a day (TID) | INTRAMUSCULAR | 11 refills | Status: DC

## 2017-11-14 NOTE — Clinical Social Work Note (Signed)
Facility notified and discharge clinicals sent. Son, MathewsBilly, notified. Transport via [facility] arranged.    LCSW signing off.     Alixander Rallis, Juleen ChinaHeather D, LCSW

## 2017-11-14 NOTE — Progress Notes (Signed)
Patient informed of the discharge instructions but unable to sign.  [facility] and report given. EMS picked the patient up to return back to facility. Patient in no distress during the transition.

## 2017-11-14 NOTE — Discharge Summary (Signed)
Physician Discharge Summary  Dawn MARCIEL Ho:[id number] DOB: 1950-10-03 DOA: 10/31/2017  PCP: Bernerd Limbo, MD  Admit date: 10/31/2017 Discharge date: 11/14/2017  Admitted From: Skilled nursing facility Disposition: Skilled nursing facility  Recommendations for Outpatient Follow-up:  1. Follow up with me at SNF in 1 week.  Advance diet to regular if tolerating full liquid diet for another day. 2. Please check BMET within 1 week.  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  She was found to have E. coli UTI which was treated with IV antibiotics.  Stool studies were positive for neurovirus.  She also had acute kidney injury which improved with IV fluids.  Hospital course prolonged due to development of persistent vomiting due to an ileus requiring nonsurgical management.  Hospital course  Principal problem E. coli UTI Treated with Rocephin.  Active problems Ileus/partial small bowel obstruction. Required nasogastric decompression with  nonsurgical management.  Appreciate surgery consult.  Now resolved and tolerating full liquid diet.  Advance to regular in 1-2 days.  Diabetes mellitus, insulin-dependent, uncontrolled On high-dose Lantus and aspart as outpatient.  Currently on low-dose of insulin since diet has not been advanced fully.  Will discharge her on Lantus 12 units along with 5 units 3 times daily aspart and titrate as diet advanced.  Norovirus diarrhea Improved with IV fluids.  Severe hypokalemia/hypomagnesemia Replenished aggressively.  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  Has residual left hemiparesis.  Aspiration into airway Respiratory status stable.  Received Rocephin for E. coli UTI.  No further antibiotics given.   Acute kidney injury (HCC) Prerenal secondary to dehydration and UTI.  ARB held and improved with IV fluids.  Chronic diastolic CHF Euvolemic.  Continue beta-blocker, aspirin, statin and ARB  Acute toxic metabolic encephalopathy Secondary to infection and dehydration.  Now resolved.  Patient is on chronic narcotic, Ativan and baclofen which is resumed.  CT head and MRI brain on admission without new findings.  Morbid obesity   Family communication: None at bedside Consults: General surgery  Procedure: CT abdomen and pelvis CT head MRI brain    Discharge Instructions   Allergies as of 11/14/2017      Reactions   Ivp Dye [iodinated Diagnostic Agents]    Per pt: swelling with first contrast xray in 1969. Second contrast xray in the 90s without symptoms:  Patient has been pre-medicated for CT scans in 2016. Needs to have pre-medications before any scans with IV contrast. / TSF 08/30/16   Penicillins Swelling, Rash      Medication List    STOP taking these medications   insulin NPH Human 100 UNIT/ML injection  Commonly known as:  HUMULIN N,NOVOLIN N     TAKE these medications   acetaminophen 500 MG tablet Commonly known  as:  TYLENOL Take 1,000 mg by mouth 2 (two) times daily as needed for moderate pain.   aspirin EC 81 MG tablet Take 81 mg by mouth daily.   atorvastatin 20 MG tablet Commonly known as:  LIPITOR Take 1 tablet (20 mg total) by mouth daily at 6 PM.   baclofen 10 MG tablet Commonly known as:  LIORESAL Take 1 tablet (10 mg total) by mouth 2 (two) times daily as needed.   clopidogrel 75 MG tablet Commonly known as:  PLAVIX Take 1 tablet (75 mg total) by mouth daily.   cyanocobalamin 1000 MCG/ML injection Commonly known as:  (VITAMIN B-12) Inject 1 mL (1,000 mcg total) into the muscle every 30 (thirty) days. Start taking on:  12/11/2017   esomeprazole 40 MG packet Commonly known as:  NEXIUM Take 40 mg by mouth daily before breakfast.   feeding supplement (GLUCERNA SHAKE) Liqd Take 237 mLs by mouth 3 (three) times daily between meals.   feeding supplement (PRO-STAT SUGAR FREE 64) Liqd Take 30 mLs by mouth daily.   furosemide 20 MG tablet Commonly known as:  LASIX Take 60 mg by mouth daily.   gabapentin 300 MG capsule Commonly known as:  NEURONTIN Take 1 capsule (300 mg total) by mouth at bedtime. What changed:  when to take this   insulin glargine 100 UNIT/ML injection Commonly known as:  LANTUS Inject 0.15 mLs (15 Units total) into the skin at bedtime. What changed:  how much to take   insulin regular 100 units/mL injection Commonly known as:  NOVOLIN R,HUMULIN R Inject 0.05 mLs (5 Units total) into the skin 3 (three) times daily before meals. What changed:    how much to take  when to take this  additional instructions   levothyroxine 112 MCG tablet Commonly known as:  SYNTHROID, LEVOTHROID Take 112 mcg by mouth daily.   LORazepam 0.5 MG tablet Commonly known as:  ATIVAN Take one tablet by mouth twice daily for anxiety   losartan 100 MG tablet Commonly known as:  COZAAR Take 100 mg by mouth daily.   metoprolol tartrate 50 MG tablet Commonly known as:   LOPRESSOR Take 25-50 mg by mouth 2 (two) times daily. Patient takes 50mg  in the morning and 25mg  in the evening   multivitamin with minerals tablet Take 1 tablet by mouth daily.   nitroGLYCERIN 0.4 MG SL tablet Commonly known as:  NITROSTAT Place 0.4 mg under the tongue every 5 (five) minutes as needed for chest pain.   oxyCODONE 5 MG immediate release tablet Commonly known as:  Oxy IR/ROXICODONE Take 1 tablet (5 mg total) by mouth 2 (two) times daily.   potassium chloride SA 20 MEQ tablet Commonly known as:  K-DUR,KLOR-CON Take 20 mEq by mouth daily.   sennosides-docusate sodium 8.6-50 MG tablet Commonly known as:  SENOKOT-S Take 1 tablet by mouth daily.   sertraline 100 MG tablet Commonly known as:  ZOLOFT Take 100 mg by mouth daily.      Follow-up Information    MD at SNF in 1week Follow up in 1 week(s).          Allergies  Allergen Reactions  . Ivp Dye [Iodinated Diagnostic Agents]     Per pt: swelling with first contrast xray in 1969. Second contrast xray in the 90s without symptoms:  Patient has been pre-medicated for CT scans  in 2016. Needs to have pre-medications before any scans with IV contrast. / TSF 08/30/16  . Penicillins Swelling and Rash        Procedures/Studies: Ct Abdomen Pelvis Wo Contrast  Result Date: 11/05/2017 CLINICAL DATA:  Abdominal distention and weakness. EXAM: CT ABDOMEN AND PELVIS WITHOUT CONTRAST TECHNIQUE: Multidetector CT imaging of the abdomen and pelvis was performed following the standard protocol without IV contrast. COMPARISON:  08/30/2016 and 03/10/2010 CT, abdomen radiographs from 11/05/2017. FINDINGS: Lower chest: Small bilateral pleural effusions right greater than left with cardiomegaly. Small hiatal hernia is noted. Hepatobiliary: Status post cholecystectomy. Small subcapsular calcification in the right hepatic lobe likely representing is tiny granuloma. A 12 mm water attenuating cyst or hemangioma along the anterior aspect  of the left hepatic lobe is redemonstrated and is unchanged. No biliary dilatation. Pancreas: Atrophic pancreas without ductal dilatation, mass or inflammation. Spleen: Normal size spleen without mass. Adrenals/Urinary Tract: Stable appearance of the adrenal glands. Stable nodular thickening of the right adrenal apex unchanged in appearance measuring 15 x 11 mm dating back to 2011 consistent with a benign finding. Punctate hyperdensities within the interpolar aspect of the right kidney likely represent vascular calcifications. No hydroureteronephrosis. The urinary bladder is nondistended and demonstrates no focal mural thickening or calculus. Stomach/Bowel: There is a small stable hiatal hernia. Fluid-filled distention of the stomach is noted without mural thickening. Normal small bowel rotation is identified. Fluid-filled distention of small bowel loops are identified from the third portion of the duodenum through jejunum to the level of the distal ileum measuring up to 2.8 cm. Streaky bandlike adhesions are noted in the right hemipelvis likely contributing to this obstructive bowel gas pattern. Liquid stool is seen within colon consistent with diarrheal disease. No mural thickening is identified. Normal appearance of the appendix. Vascular/Lymphatic: Aortoiliac atherosclerosis without aneurysm. No lymphadenopathy. Reproductive: Status post hysterectomy.  No adnexal mass. Other: Small amount of ascites adjacent to the liver. Musculoskeletal: L1-2 marked degenerative disc disease. No acute nor suspicious osseous abnormality. Multilevel degenerative lumbar facet arthropathy from at least L3 through S1. Mild soft tissue anasarca is noted. IMPRESSION: 1. Abnormal fluid-filled distention of small intestine from jejunum to distal ileum with transition point in the right lower quadrant likely related to postoperative adhesions. This appears to be contributing to at least a high-grade partial SBO or early SBO. Fluid-filled  large bowel loops are noted without obstruction compatible with diarrheal disease. 2. Status post hysterectomy.  No adnexal mass. 3. Small hiatal hernia. 4. Lumbar degenerative facet arthropathy. Degenerative disc disease L1-2. Electronically Signed   By: Tollie Eth M.D.   On: 11/05/2017 18:27   Dg Chest 2 View  Result Date: 11/05/2017 CLINICAL DATA:  Leukocytosis.  Patient admitted for gastroenteritis. EXAM: CHEST - 2 VIEW COMPARISON:  10/31/2017 FINDINGS: Low lung volumes with crowding of interstitial lung markings and mild pulmonary vascular congestion. Small posterior pleural effusions. Stable cardiomegaly with aortic atherosclerosis. Loop recording device projects over the left heart border. No acute pulmonary consolidation, effusion or pneumothorax. Remote bilateral rib fractures. No acute osseous abnormality. IMPRESSION: Low lung volumes with stable cardiomegaly and aortic atherosclerosis. Mild pulmonary vascular congestion. Small posterior pleural effusions are noted. Electronically Signed   By: Tollie Eth M.D.   On: 11/05/2017 18:09   Dg Abd 1 View  Addendum Date: 11/11/2017   ADDENDUM REPORT: 11/11/2017 16:38 ADDENDUM: The patient no longer has an OGT in place and the radiopaque line marked on the image is best attributed to an aortic atherosclerotic  calcification. Electronically Signed   By: Marnee Spring M.D.   On: 11/11/2017 16:38   Result Date: 11/11/2017 CLINICAL DATA:  Ileus EXAM: ABDOMEN - 1 VIEW COMPARISON:  11/09/2017 FINDINGS: Dilated small and large bowel. Distension is borderline improved compared to prior, as permitted by partial coverage of the bowel. Cardiomegaly. Patient's nasogastric tube tip projects over the lower mediastinum. Deformity of the posterior right ribs, likely old fracture. These results will be called to the ordering clinician or representative by the Radiologist Assistant, and communication documented in the PACS or zVision Dashboard. IMPRESSION: 1. History of  ileus with stable to borderline improved bowel distention. 2. Interval shortening of nasogastric tube, tip over the lower mediastinum/esophagus. Electronically Signed: By: Marnee Spring M.D. On: 11/11/2017 16:28   Dg Abd 1 View  Result Date: 11/09/2017 CLINICAL DATA:  Small-bowel obstruction EXAM: ABDOMEN - 1 VIEW COMPARISON:  Portable exam 1103 hours compared to 11/06/2017 FINDINGS: Tip of nasogastric tube projects over proximal stomach though the proximal side-port may be at the distal esophagus. Rotated to the RIGHT; due to rotation, portions of the RIGHT abdomen and inferior pelvis are not visualized. Slight gaseous distention of gastric antrum. Gaseous distension of cecum. Numerous air-filled distended loops of small bowel throughout the upper and mid abdomen. No definite bowel wall thickening. Bones demineralized. IMPRESSION: Persistent small bowel dilatation with mild associated gaseous distention of the cecum. Electronically Signed   By: Ulyses Southward M.D.   On: 11/09/2017 13:07   Dg Abd 1 View  Result Date: 11/05/2017 CLINICAL DATA:  Abdominal distension EXAM: ABDOMEN - 1 VIEW COMPARISON:  CT abdomen 08/30/2016 FINDINGS: There is significant diffuse small bowel dilatation. There is gaseous distention of the colon. There is no evidence of pneumoperitoneum, portal venous gas or pneumatosis. There are no pathologic calcifications along the expected course of the ureters. The osseous structures are unremarkable. IMPRESSION: Gaseous distension of the small bowel and to lesser extent the colon, as can be seen with an ileus versus distal bowel obstruction. Electronically Signed   By: Elige Ko   On: 11/05/2017 10:09   Ct Head Wo Contrast  Result Date: 10/31/2017 CLINICAL DATA:  Altered level of consciousness, slurring words EXAM: CT HEAD WITHOUT CONTRAST TECHNIQUE: Contiguous axial images were obtained from the base of the skull through the vertex without intravenous contrast. COMPARISON:  CT brain  scan of 08/30/2016 FINDINGS: Brain: The ventricular system remains prominent as do the cortical sulci, indicative of diffuse atrophy. The septum is midline in position. Significant small vessel ischemic change is noted throughout the periventricular white matter and bilateral old lacunar infarcts are noted. No definite acute interval change is seen. No hemorrhage is noted and there is no evidence of mass effect. Vascular: No vascular abnormality is seen on this unenhanced study. Skull: On bone window images, no calvarial abnormality is seen Sinuses/Orbits: .  The paranasal sinuses appear well pneumatized. Other: None. IMPRESSION: 1. Stable diffuse atrophy and significant small vessel ischemic change throughout the periventricular white matter. 2. Stable old lacunar infarcts bilaterally. No definite acute process. Electronically Signed   By: Dwyane Dee M.D.   On: 10/31/2017 10:10   Mr Brain Wo Contrast  Result Date: 10/31/2017 CLINICAL DATA:  67 year old female with 2 days of altered mental status. No known injury. EXAM: MRI HEAD WITHOUT CONTRAST TECHNIQUE: Multiplanar, multiecho pulse sequences of the brain and surrounding structures were obtained without intravenous contrast. COMPARISON:  Head CT without contrast 0955 hours today. Brain MRI 11/17/2014. FINDINGS: Brain: No  restricted diffusion or evidence of acute infarction. Chronic infarcts in the left basal ganglia, left thalamus, bilateral corona radiata, and right middle frontal gyrus with encephalomalacia and some hemosiderin are stable since 2016. Wallerian degeneration has developed in the right cerebral peduncle since that time. Additional widespread patchy cerebral white matter T2 and FLAIR hyperintensity has not significantly changed since 2016. Small lacunar infarcts in the right pons are stable to increased since the prior MRI, but chronic. The cerebellum remains normal. No midline shift, mass effect, evidence of mass lesion, ventriculomegaly,  extra-axial collection or acute intracranial hemorrhage. Cervicomedullary junction and pituitary are within normal limits. Vascular: Major intracranial vascular flow voids are stable since 2016. Skull and upper cervical spine: Negative visible cervical spine. Normal bone marrow signal. Sinuses/Orbits: Interval postoperative changes to the left globe. Otherwise negative orbits soft tissues. Paranasal sinuses and mastoids are stable and well pneumatized. Other: Scalp and face soft tissues appear negative. IMPRESSION: 1.  No acute intracranial abnormality. 2. Advanced chronic ischemic disease without significant progression since the 2016 MRI. Wallerian degeneration has developed along the right cerebral peduncle. Electronically Signed   By: Odessa Fleming M.D.   On: 10/31/2017 11:20   Dg Chest Port 1 View  Result Date: 11/06/2017 CLINICAL DATA:  Aspiration, rattling in the throat and chest. EXAM: PORTABLE CHEST 1 VIEW COMPARISON:  Chest x-ray of November 05, 2017 FINDINGS: The images are limited due to the patient's body habitus. The lungs are mildly hypoinflated which is accentuated by the lordotic positioning. There is no focal infiltrate. The interstitial markings are coarse however in the right infrahilar region. There is no definite pleural effusion. The cardiac silhouette is enlarged. The pulmonary vascularity is not engorged. IMPRESSION: The study is limited due to technical factors related to scatter effects. There may be atelectasis or infiltrate in the right lower lobe. Cardiomegaly without pulmonary vascular congestion. Electronically Signed   By: David  [place] M.D.   On: 11/06/2017 11:26   Dg Chest Portable 1 View  Result Date: 10/31/2017 CLINICAL DATA:  Weakness.  Diabetes. EXAM: PORTABLE CHEST 1 VIEW COMPARISON:  CT 11/19/2014. FINDINGS: Mediastinum hilar structures are normal. Cardiomegaly with normal pulmonary vascularity. Low lung volumes with mild basilar atelectasis. Tiny rounded radiopacity noted  projected over lower anterior rib is unchanged and is most likely a bone island. IMPRESSION: 1.  Cardiomegaly.  No pulmonary venous congestion. 2. Low lung volumes with mild basilar atelectasis. No acute infiltrate. Electronically Signed   By: Maisie Fus  [place]   On: 10/31/2017 09:27   Dg Chest Port 1v Same Day  Result Date: 11/06/2017 CLINICAL DATA:  Nasogastric tube placement EXAM: PORTABLE CHEST 1 VIEW COMPARISON:  Portable exam 1444 hours compared to 1107 hours FINDINGS: Nasogastric tube extends into abdomen. Enlargement of cardiac silhouette with pulmonary vascular congestion. Loop recorder projects over lower chest. Minimal RIGHT basilar atelectasis. Lungs otherwise grossly clear. Bones demineralized. IMPRESSION: Nasogastric tube extends into abdomen. Enlargement of cardiac silhouette with pulmonary vascular congestion and minimal RIGHT basilar atelectasis. Electronically Signed   By: Ulyses Southward M.D.   On: 11/06/2017 15:02   Dg Abd 2 Views  Result Date: 11/13/2017 CLINICAL DATA:  Ileus, diabetes mellitus, hypertension EXAM: ABDOMEN - 2 VIEW COMPARISON:  Portable exam 0812 hours compared to 11/11/2017; correlation CT abdomen and pelvis 03/10/2010 FINDINGS: Enlargement of cardiac silhouette with loop recorder projecting over heart. Lung bases grossly clear. Air-filled loops of large and small bowel on abdomen, with small bowel loops appearing slightly less distended than on previous exam.  No definite bowel wall thickening. Bones appear demineralized with sclerotic focus question large bone island in LEFT ilium unchanged since 2011. IMPRESSION: Decreased small bowel distention versus previous exam. Electronically Signed   By: Ulyses Southward M.D.   On: 11/13/2017 10:16   Dg Abd Portable 1v  Result Date: 11/06/2017 CLINICAL DATA:  Nasogastric tube placement EXAM: PORTABLE ABDOMEN - 1 VIEW COMPARISON:  Portable exam 1443 hours compared to CT abdomen and pelvis 11/05/2017 FINDINGS: Abdomen rotated to the  RIGHT. Nasogastric tube extends through stomach with tip roughly projecting over pyloric region versus duodenal bulb. Air-filled loops of large and small bowel in upper abdomen. Heart appears enlarged with loop recorder noted. Lung bases clear. IMPRESSION: Tip of nasogastric tube projects roughly over pylorus or duodenal bulb on a rotated exam. Electronically Signed   By: Ulyses Southward M.D.   On: 11/06/2017 15:01      Subjective: Patient seen and examined today.  Denies any abdominal pain, nausea, vomiting.  Has had bowel movement.  Tolerating full liquid diet.  Discharge Exam: Vitals:   11/13/17 1700 11/13/17 2000  BP: (!) 158/81   Pulse: 67   Resp: 15   Temp:  99.2 F (37.3 C)  SpO2: 96%    Vitals:   11/13/17 1500 11/13/17 1600 11/13/17 1700 11/13/17 2000  BP: (!) 163/74 (!) 182/77 (!) 158/81   Pulse: 67 66 67   Resp: 15 16 15    Temp:    99.2 F (37.3 C)  TempSrc:    Oral  SpO2: 96% 97% 96%   Weight:      Height:        General: Elderly morbidly obese female lying in bed appears fatigued HEENT: No pallor, moist mucosa, supple neck Chest: Clear bilaterally CVS: Normal S1 and S2, no murmurs rubs or gallop GI: Soft, nondistended, nontender, bowel sounds present Musculoskeletal: Warm, no edema CNS: Alert and oriented, chronic left hemiparesis    The results of significant diagnostics from this hospitalization (including imaging, microbiology, ancillary and laboratory) are listed below for reference.     Microbiology: Recent Results (from the past 240 hour(s))  C difficile quick scan w PCR reflex     Status: None   Collection Time: 11/06/17  3:45 AM  Result Value Ref Range Status   C Diff antigen NEGATIVE NEGATIVE Final   C Diff toxin NEGATIVE NEGATIVE Final   C Diff interpretation No C. difficile detected.  Final    Comment: Performed at [facility], [address], [place], [place] [address]  Gastrointestinal Panel by PCR , Stool     Status: Abnormal   Collection  Time: 11/06/17  3:45 AM  Result Value Ref Range Status   Campylobacter species NOT DETECTED NOT DETECTED Final   Plesimonas shigelloides NOT DETECTED NOT DETECTED Final   Salmonella species NOT DETECTED NOT DETECTED Final   Yersinia enterocolitica NOT DETECTED NOT DETECTED Final   Vibrio species NOT DETECTED NOT DETECTED Final   Vibrio cholerae NOT DETECTED NOT DETECTED Final   Enteroaggregative E coli (EAEC) NOT DETECTED NOT DETECTED Final   Enteropathogenic E coli (EPEC) NOT DETECTED NOT DETECTED Final   Enterotoxigenic E coli (ETEC) NOT DETECTED NOT DETECTED Final   Shiga like toxin producing E coli (STEC) NOT DETECTED NOT DETECTED Final   Shigella/Enteroinvasive E coli (EIEC) NOT DETECTED NOT DETECTED Final   Cryptosporidium NOT DETECTED NOT DETECTED Final   Cyclospora cayetanensis NOT DETECTED NOT DETECTED Final   Entamoeba histolytica NOT DETECTED NOT DETECTED Final  Giardia lamblia NOT DETECTED NOT DETECTED Final   Adenovirus F40/41 NOT DETECTED NOT DETECTED Final   Astrovirus NOT DETECTED NOT DETECTED Final   Norovirus GI/GII DETECTED (A) NOT DETECTED Final    Comment: RESULT CALLED TO, READ BACK BY AND VERIFIED WITH: TIFFANY ROBERTS AT 1827 ON 11/14/2017 JJB    Rotavirus A NOT DETECTED NOT DETECTED Final   Sapovirus (I, II, IV, and V) NOT DETECTED NOT DETECTED Final    Comment: Performed at [facility], [address] [place], [place] [address]  Culture, blood (Routine X 2) w Reflex to ID Panel     Status: Abnormal   Collection Time: 11/06/17  6:49 AM  Result Value Ref Range Status   Specimen Description   Final    BLOOD LEFT HAND Performed at [facility], [address] [place], [place] [address]    Special Requests   Final    BOTTLES DRAWN AEROBIC ONLY Blood Culture adequate volume Performed at [facility], [address] [place], [place] [address]    Culture  Setup Time   Final    GRAM POSITIVE COCCI Gram Stain Report Called to,Read Back By and Verified  With: MCDANIEL,M. AT 1720 ON 11/07/2017 BY EVA AEROBIC BOTTLE ONLY Performed at [facility] CRITICAL RESULT CALLED TO, READ BACK BY AND VERIFIED WITH: B. ACORD,RN [id number] 11/08/2017 T. TYSOR    Culture (A)  Final    STAPHYLOCOCCUS SPECIES (COAGULASE NEGATIVE) SUSCEPTIBILITIES PERFORMED ON PREVIOUS CULTURE WITHIN THE LAST 5 DAYS. Performed at [facility], [address] [place], [place] [address]    Report Status 11/11/2017 FINAL  Final  Blood Culture ID Panel (Reflexed)     Status: None   Collection Time: 11/06/17  6:49 AM  Result Value Ref Range Status   Enterococcus species NOT DETECTED NOT DETECTED Final   Listeria monocytogenes NOT DETECTED NOT DETECTED Final   Staphylococcus species NOT DETECTED NOT DETECTED Final   Staphylococcus aureus NOT DETECTED NOT DETECTED Final   Streptococcus species NOT DETECTED NOT DETECTED Final   Streptococcus agalactiae NOT DETECTED NOT DETECTED Final   Streptococcus pneumoniae NOT DETECTED NOT DETECTED Final   Streptococcus pyogenes NOT DETECTED NOT DETECTED Final   Acinetobacter baumannii NOT DETECTED NOT DETECTED Final   Enterobacteriaceae species NOT DETECTED NOT DETECTED Final   Enterobacter cloacae complex NOT DETECTED NOT DETECTED Final   Escherichia coli NOT DETECTED NOT DETECTED Final   Klebsiella oxytoca NOT DETECTED NOT DETECTED Final   Klebsiella pneumoniae NOT DETECTED NOT DETECTED Final   Proteus species NOT DETECTED NOT DETECTED Final   Serratia marcescens NOT DETECTED NOT DETECTED Final   Haemophilus influenzae NOT DETECTED NOT DETECTED Final   Neisseria meningitidis NOT DETECTED NOT DETECTED Final   Pseudomonas aeruginosa NOT DETECTED NOT DETECTED Final   Candida albicans NOT DETECTED NOT DETECTED Final   Candida glabrata NOT DETECTED NOT DETECTED Final   Candida krusei NOT DETECTED NOT DETECTED Final   Candida parapsilosis NOT DETECTED NOT DETECTED Final   Candida tropicalis NOT DETECTED NOT DETECTED Final   Culture, blood (Routine X 2) w Reflex to ID Panel     Status: Abnormal   Collection Time: 11/06/17  7:56 AM  Result Value Ref Range Status   Specimen Description   Final    BLOOD LEFT ARM Performed at [facility], [address], [place], [place] [address]    Special Requests   Final    BOTTLES DRAWN AEROBIC AND ANAEROBIC Blood Culture adequate volume  Performed at [facility], [address], [place], [place] [address]    Culture  Setup Time   Final    GRAM POSITIVE COCCI Gram Stain Report Called to,Read Back By and Verified With: DANIELS,J. AT 2229 ON 11/09/2017 BY EVA AEROBIC BOTTLE ONLY Performed at [facility] CRITICAL RESULT CALLED TO, READ BACK BY AND VERIFIED WITH: Rexene Alberts [id number] 11/10/2017 Girtha Hake Performed at [facility], [address], [place], [place] [address]    Culture STAPHYLOCOCCUS SPECIES (COAGULASE NEGATIVE) (A)  Final   Report Status 11/12/2017 FINAL  Final   Organism ID, Bacteria STAPHYLOCOCCUS SPECIES (COAGULASE NEGATIVE)  Final      Susceptibility   Staphylococcus species (coagulase negative) - MIC*    CIPROFLOXACIN >=8 RESISTANT Resistant     ERYTHROMYCIN 0.5 SENSITIVE Sensitive     GENTAMICIN <=0.5 SENSITIVE Sensitive     OXACILLIN 0.5 RESISTANT Resistant     TETRACYCLINE <=1 SENSITIVE Sensitive     VANCOMYCIN 1 SENSITIVE Sensitive     TRIMETH/SULFA 20 SENSITIVE Sensitive     CLINDAMYCIN <=0.25 SENSITIVE Sensitive     RIFAMPIN <=0.5 SENSITIVE Sensitive     Inducible Clindamycin NEGATIVE Sensitive     * STAPHYLOCOCCUS SPECIES (COAGULASE NEGATIVE)     Labs: BNP (last 3 results) No results for input(s): BNP in the last 8760 hours. Basic Metabolic Panel: Recent Labs  Lab 11/09/17 0421 11/09/17 1548 11/10/17 0358 11/11/17 0415 11/12/17 1125 11/13/17 0416  NA 152*  --  144 144 145 148*  K 1.9* 3.0* 3.2* 2.2* 3.0* 3.7  CL 115*  --  112* 112* 116* 121*  CO2 25  --  23 23 19* 17*  GLUCOSE 141*  --  264* 160* 110* 88  BUN 9  --  6  5* <5* <5*  CREATININE 0.83  --  0.75 0.86 0.69 0.76  CALCIUM 8.1*  --  7.7* 8.2* 8.1* 8.0*  MG  --   --   --  1.7 1.6* 1.9  PHOS  --   --   --  2.9  --   --    Liver Function Tests: No results for input(s): AST, ALT, ALKPHOS, BILITOT, PROT, ALBUMIN in the last 168 hours. No results for input(s): LIPASE, AMYLASE in the last 168 hours. No results for input(s): AMMONIA in the last 168 hours. CBC: Recent Labs  Lab 11/09/17 0421 11/10/17 0358 11/11/17 0415  WBC 13.2* 11.6* 12.0*  HGB 11.2* 11.5* 11.5*  HCT 35.8* 36.2 36.3  MCV 90.4 90.7 90.1  PLT 243 215 228   Cardiac Enzymes: No results for input(s): CKTOTAL, CKMB, CKMBINDEX, TROPONINI in the last 168 hours. BNP: Invalid input(s): POCBNP CBG: Recent Labs  Lab 11/13/17 1230 11/13/17 1728 11/13/17 2208 11/14/17 0738 11/14/17 1141  GLUCAP 160* 144* 142* 133* 167*   D-Dimer No results for input(s): DDIMER in the last 72 hours. Hgb A1c No results for input(s): HGBA1C in the last 72 hours. Lipid Profile No results for input(s): CHOL, HDL, LDLCALC, TRIG, CHOLHDL, LDLDIRECT in the last 72 hours. Thyroid function studies No results for input(s): TSH, T4TOTAL, T3FREE, THYROIDAB in the last 72 hours.  Invalid input(s): FREET3 Anemia work up No results for input(s): VITAMINB12, FOLATE, FERRITIN, TIBC, IRON, RETICCTPCT in the last 72 hours. Urinalysis    Component Value Date/Time   COLORURINE AMBER (A) 11/05/2017 1900   APPEARANCEUR CLOUDY (A) 11/05/2017 1900   LABSPEC 1.020 11/05/2017 1900   PHURINE 6.0 11/05/2017 1900   GLUCOSEU NEGATIVE 11/05/2017  1900   HGBUR SMALL (A) 11/05/2017 1900   BILIRUBINUR NEGATIVE 11/05/2017 1900   KETONESUR NEGATIVE 11/05/2017 1900   PROTEINUR NEGATIVE 11/05/2017 1900   UROBILINOGEN 0.2 11/17/2014 1100   NITRITE NEGATIVE 11/05/2017 1900   LEUKOCYTESUR MODERATE (A) 11/05/2017 1900   Sepsis Labs Invalid input(s): PROCALCITONIN,  WBC,  LACTICIDVEN Microbiology Recent Results (from the  past 240 hour(s))  C difficile quick scan w PCR reflex     Status: None   Collection Time: 11/06/17  3:45 AM  Result Value Ref Range Status   C Diff antigen NEGATIVE NEGATIVE Final   C Diff toxin NEGATIVE NEGATIVE Final   C Diff interpretation No C. difficile detected.  Final    Comment: Performed at [facility], [address], [place], [place] [address]  Gastrointestinal Panel by PCR , Stool     Status: Abnormal   Collection Time: 11/06/17  3:45 AM  Result Value Ref Range Status   Campylobacter species NOT DETECTED NOT DETECTED Final   Plesimonas shigelloides NOT DETECTED NOT DETECTED Final   Salmonella species NOT DETECTED NOT DETECTED Final   Yersinia enterocolitica NOT DETECTED NOT DETECTED Final   Vibrio species NOT DETECTED NOT DETECTED Final   Vibrio cholerae NOT DETECTED NOT DETECTED Final   Enteroaggregative E coli (EAEC) NOT DETECTED NOT DETECTED Final   Enteropathogenic E coli (EPEC) NOT DETECTED NOT DETECTED Final   Enterotoxigenic E coli (ETEC) NOT DETECTED NOT DETECTED Final   Shiga like toxin producing E coli (STEC) NOT DETECTED NOT DETECTED Final   Shigella/Enteroinvasive E coli (EIEC) NOT DETECTED NOT DETECTED Final   Cryptosporidium NOT DETECTED NOT DETECTED Final   Cyclospora cayetanensis NOT DETECTED NOT DETECTED Final   Entamoeba histolytica NOT DETECTED NOT DETECTED Final   Giardia lamblia NOT DETECTED NOT DETECTED Final   Adenovirus F40/41 NOT DETECTED NOT DETECTED Final   Astrovirus NOT DETECTED NOT DETECTED Final   Norovirus GI/GII DETECTED (A) NOT DETECTED Final    Comment: RESULT CALLED TO, READ BACK BY AND VERIFIED WITH: TIFFANY ROBERTS AT 1827 ON 11/14/2017 JJB    Rotavirus A NOT DETECTED NOT DETECTED Final   Sapovirus (I, II, IV, and V) NOT DETECTED NOT DETECTED Final    Comment: Performed at [facility], [address] [place], [place] [address]  Culture, blood (Routine X 2) w Reflex to ID Panel     Status: Abnormal   Collection Time:  11/06/17  6:49 AM  Result Value Ref Range Status   Specimen Description   Final    BLOOD LEFT HAND Performed at [facility], [address] [place], [place] [address]    Special Requests   Final    BOTTLES DRAWN AEROBIC ONLY Blood Culture adequate volume Performed at [facility], [address], [place], [place] [address]    Culture  Setup Time   Final    GRAM POSITIVE COCCI Gram Stain Report Called to,Read Back By and Verified With: MCDANIEL,M. AT 1720 ON 11/07/2017 BY EVA AEROBIC BOTTLE ONLY Performed at [facility] CRITICAL RESULT CALLED TO, READ BACK BY AND VERIFIED WITH: B. ACORD,RN 0056 11/08/2017 T. TYSOR    Culture (A)  Final    STAPHYLOCOCCUS SPECIES (COAGULASE NEGATIVE) SUSCEPTIBILITIES PERFORMED ON PREVIOUS CULTURE WITHIN THE LAST 5 DAYS. Performed at [facility], [address] [place], [place] [address]    Report Status 11/11/2017 FINAL  Final  Blood Culture ID Panel (Reflexed)     Status: None   Collection Time: 11/06/17  6:49 AM  Result Value Ref Range Status   Enterococcus species NOT DETECTED NOT DETECTED Final   Listeria monocytogenes NOT DETECTED NOT DETECTED Final   Staphylococcus species NOT DETECTED NOT DETECTED Final   Staphylococcus aureus NOT DETECTED NOT DETECTED Final   Streptococcus species NOT DETECTED NOT DETECTED Final   Streptococcus agalactiae NOT DETECTED NOT DETECTED Final   Streptococcus pneumoniae NOT DETECTED NOT DETECTED Final   Streptococcus pyogenes NOT DETECTED NOT DETECTED Final   Acinetobacter baumannii NOT DETECTED NOT DETECTED Final   Enterobacteriaceae species NOT DETECTED NOT DETECTED Final   Enterobacter cloacae complex NOT DETECTED NOT DETECTED Final   Escherichia coli NOT DETECTED NOT DETECTED Final   Klebsiella oxytoca NOT DETECTED NOT DETECTED Final   Klebsiella pneumoniae NOT DETECTED NOT DETECTED Final   Proteus species NOT DETECTED NOT DETECTED Final   Serratia marcescens NOT DETECTED NOT DETECTED Final    Haemophilus influenzae NOT DETECTED NOT DETECTED Final   Neisseria meningitidis NOT DETECTED NOT DETECTED Final   Pseudomonas aeruginosa NOT DETECTED NOT DETECTED Final   Candida albicans NOT DETECTED NOT DETECTED Final   Candida glabrata NOT DETECTED NOT DETECTED Final   Candida krusei NOT DETECTED NOT DETECTED Final   Candida parapsilosis NOT DETECTED NOT DETECTED Final   Candida tropicalis NOT DETECTED NOT DETECTED Final  Culture, blood (Routine X 2) w Reflex to ID Panel     Status: Abnormal   Collection Time: 11/06/17  7:56 AM  Result Value Ref Range Status   Specimen Description   Final    BLOOD LEFT ARM Performed at [facility], [address] [place], [place] [address]    Special Requests   Final    BOTTLES DRAWN AEROBIC AND ANAEROBIC Blood Culture adequate volume Performed at [facility], [address] [place], [place] [address]    Culture  Setup Time   Final    GRAM POSITIVE COCCI Gram Stain Report Called to,Read Back By and Verified With: DANIELS,J. AT 2229 ON 11/09/2017 BY EVA AEROBIC BOTTLE ONLY Performed at [facility] CRITICAL RESULT CALLED TO, READ BACK BY AND VERIFIED WITH: Rexene Alberts 0981 11/10/2017 Girtha Hake Performed at [facility], [address], [place], [place] [address]    Culture STAPHYLOCOCCUS SPECIES (COAGULASE NEGATIVE) (A)  Final   Report Status 11/12/2017 FINAL  Final   Organism ID, Bacteria STAPHYLOCOCCUS SPECIES (COAGULASE NEGATIVE)  Final      Susceptibility   Staphylococcus species (coagulase negative) - MIC*    CIPROFLOXACIN >=8 RESISTANT Resistant     ERYTHROMYCIN 0.5 SENSITIVE Sensitive     GENTAMICIN <=0.5 SENSITIVE Sensitive     OXACILLIN 0.5 RESISTANT Resistant     TETRACYCLINE <=1 SENSITIVE Sensitive     VANCOMYCIN 1 SENSITIVE Sensitive     TRIMETH/SULFA 20 SENSITIVE Sensitive     CLINDAMYCIN <=0.25 SENSITIVE Sensitive     RIFAMPIN <=0.5 SENSITIVE Sensitive     Inducible Clindamycin NEGATIVE Sensitive     *  STAPHYLOCOCCUS SPECIES (COAGULASE NEGATIVE)     Time coordinating discharge: Over 30 minutes  SIGNED:   Eddie North, MD  [facility] 11/14/2017, 12:30 PM Pager   If 7PM-7AM, please contact night-coverage [email or web address] Password [email or web address]

## 2019-02-21 ENCOUNTER — Emergency Department (HOSPITAL_COMMUNITY): Payer: Medicare Other

## 2019-02-21 ENCOUNTER — Emergency Department (HOSPITAL_COMMUNITY)
Admission: EM | Admit: 2019-02-21 | Discharge: 2019-02-21 | Disposition: A | Discharge status: Home / Self Care | Payer: Medicare Other | Attending: Emergency Medicine | Admitting: Emergency Medicine

## 2019-02-21 ENCOUNTER — Encounter (HOSPITAL_COMMUNITY): Payer: Self-pay | Admitting: Emergency Medicine

## 2019-02-21 DIAGNOSIS — E785 Hyperlipidemia, unspecified: Secondary | ICD-10-CM | POA: Diagnosis not present

## 2019-02-21 DIAGNOSIS — E119 Type 2 diabetes mellitus without complications: Secondary | ICD-10-CM | POA: Diagnosis not present

## 2019-02-21 DIAGNOSIS — Z880 Allergy status to penicillin: Secondary | ICD-10-CM | POA: Insufficient documentation

## 2019-02-21 DIAGNOSIS — S42202A Unspecified fracture of upper end of left humerus, initial encounter for closed fracture: Secondary | ICD-10-CM | POA: Insufficient documentation

## 2019-02-21 DIAGNOSIS — W19XXXA Unspecified fall, initial encounter: Secondary | ICD-10-CM

## 2019-02-21 DIAGNOSIS — Y9289 Other specified places as the place of occurrence of the external cause: Secondary | ICD-10-CM | POA: Insufficient documentation

## 2019-02-21 DIAGNOSIS — Z8249 Family history of ischemic heart disease and other diseases of the circulatory system: Secondary | ICD-10-CM | POA: Diagnosis not present

## 2019-02-21 DIAGNOSIS — Y9389 Activity, other specified: Secondary | ICD-10-CM | POA: Diagnosis not present

## 2019-02-21 DIAGNOSIS — I10 Essential (primary) hypertension: Secondary | ICD-10-CM | POA: Diagnosis not present

## 2019-02-21 DIAGNOSIS — I6782 Cerebral ischemia: Secondary | ICD-10-CM | POA: Insufficient documentation

## 2019-02-21 DIAGNOSIS — Z8673 Personal history of transient ischemic attack (TIA), and cerebral infarction without residual deficits: Secondary | ICD-10-CM | POA: Diagnosis not present

## 2019-02-21 DIAGNOSIS — R52 Pain, unspecified: Secondary | ICD-10-CM

## 2019-02-21 DIAGNOSIS — Z7982 Long term (current) use of aspirin: Secondary | ICD-10-CM | POA: Diagnosis not present

## 2019-02-21 DIAGNOSIS — Z993 Dependence on wheelchair: Secondary | ICD-10-CM | POA: Diagnosis not present

## 2019-02-21 DIAGNOSIS — Z794 Long term (current) use of insulin: Secondary | ICD-10-CM | POA: Diagnosis not present

## 2019-02-21 DIAGNOSIS — Y998 Other external cause status: Secondary | ICD-10-CM | POA: Insufficient documentation

## 2019-02-21 DIAGNOSIS — Z79899 Other long term (current) drug therapy: Secondary | ICD-10-CM | POA: Insufficient documentation

## 2019-02-21 DIAGNOSIS — W050XXA Fall from non-moving wheelchair, initial encounter: Secondary | ICD-10-CM | POA: Insufficient documentation

## 2019-02-21 DIAGNOSIS — S4992XA Unspecified injury of left shoulder and upper arm, initial encounter: Secondary | ICD-10-CM | POA: Diagnosis present

## 2019-02-21 LAB — CBC WITH DIFFERENTIAL/PLATELET
Abs Immature Granulocytes: 0.03 10*3/uL (ref 0.00–0.07)
Basophils Absolute: 0.1 10*3/uL (ref 0.0–0.1)
Basophils Relative: 1 %
Eosinophils Absolute: 0.1 10*3/uL (ref 0.0–0.5)
Eosinophils Relative: 1 %
HCT: 39.6 % (ref 36.0–46.0)
Hemoglobin: 12.5 g/dL (ref 12.0–15.0)
Immature Granulocytes: 0 %
Lymphocytes Relative: 16 %
Lymphs Abs: 2.1 10*3/uL (ref 0.7–4.0)
MCH: 28.1 pg (ref 26.0–34.0)
MCHC: 31.6 g/dL (ref 30.0–36.0)
MCV: 89 fL (ref 80.0–100.0)
Monocytes Absolute: 0.7 10*3/uL (ref 0.1–1.0)
Monocytes Relative: 5 %
Neutro Abs: 10.2 10*3/uL — ABNORMAL HIGH (ref 1.7–7.7)
Neutrophils Relative %: 77 %
Platelets: 205 10*3/uL (ref 150–400)
RBC: 4.45 MIL/uL (ref 3.87–5.11)
RDW: 13.4 % (ref 11.5–15.5)
WBC: 13.2 10*3/uL — ABNORMAL HIGH (ref 4.0–10.5)
nRBC: 0 % (ref 0.0–0.2)

## 2019-02-21 LAB — BASIC METABOLIC PANEL
Anion gap: 13 (ref 5–15)
BUN: 22 mg/dL (ref 8–23)
CO2: 25 mmol/L (ref 22–32)
Calcium: 9.1 mg/dL (ref 8.9–10.3)
Chloride: 106 mmol/L (ref 98–111)
Creatinine, Ser: 1.22 mg/dL — ABNORMAL HIGH (ref 0.44–1.00)
GFR calc Af Amer: 52 mL/min — ABNORMAL LOW (ref >60)
GFR calc non Af Amer: 45 mL/min — ABNORMAL LOW (ref >60)
Glucose, Bld: 155 mg/dL — ABNORMAL HIGH (ref 70–99)
Potassium: 3.3 mmol/L — ABNORMAL LOW (ref 3.5–5.1)
Sodium: 144 mmol/L (ref 135–145)

## 2019-02-21 MED ORDER — MORPHINE SULFATE (PF) 4 MG/ML IV SOLN
4.0000 mg | Freq: Once | INTRAVENOUS | Status: AC
  Administered 2019-02-21: 4 mg via INTRAVENOUS
  Filled 2019-02-21: qty 1

## 2019-02-21 MED ORDER — HYDROCODONE-ACETAMINOPHEN 5-325 MG PO TABS: 1.0000 | ORAL_TABLET | ORAL | 0 refills | Status: DC | PRN

## 2019-02-21 MED ORDER — ONDANSETRON HCL 4 MG/2ML IJ SOLN
4.0000 mg | Freq: Once | INTRAMUSCULAR | Status: AC
  Administered 2019-02-21: 4 mg via INTRAVENOUS
  Filled 2019-02-21: qty 2

## 2019-02-21 NOTE — ED Triage Notes (Signed)
Hx of multiple falls   From [facility] and fell   Now w R shoulder pain   IV by EMS

## 2019-02-21 NOTE — ED Notes (Signed)
PA to assess pt   Pt is returned from [facility]

## 2019-02-21 NOTE — Discharge Instructions (Addendum)
Follow-up with orthopedic surgery on Monday.  Keep your sling on at all times unless you are showering.  Take the pain medicine as prescribed.  Return to the ED for any new or worsening symptoms.

## 2019-02-21 NOTE — ED Notes (Signed)
Pt reports she was in a "slick" wheelchair and "slid" out onto the floor  Tried to catch herself and now with pain to her L shoulder   Sent here for [facility] for evaluation

## 2019-02-21 NOTE — ED Notes (Signed)
Pt unable to sign due to injury and confusion   Report to [facility]   As well as [id number]

## 2019-02-21 NOTE — ED Provider Notes (Signed)
[facility] EMERGENCY DEPARTMENT Provider Note   CSN: [id number] Arrival date & time: 02/21/19  1902  History   Chief Complaint Chief Complaint  Patient presents with  . Fall  . Shoulder Pain   HPI Dawn Ho is a 68 y.o. female with past medical history significant for CVA with chronic left-sided weakness, wheelchair-bound, diabetes, dyslipidemia, hypertension who presents for evaluation after fall. Patient states she went to get up out of her wheelchair and landed on her left upper extremity. Patient with left shoulder pain since the incident. Patient denies hitting her head however states she was tossed forwards and backwards in the back of her head did hit the wheelchair. She denies any lower extremity tenderness or deformity. No midline cervical or spinal tenderness.  Denies headache, vision changes, slurred speech, chest pain, shortness of breath, abdominal pain, diarrhea or dysuria.  Patient denies bowel or bladder incontinence, saddle paresthesia, history IV drug use or chronic steroids.  Not take anything for pain.  She rates her current pain a 7/10.  Denies additional aggravating or alleviating factors.  Denies pain to her elbow, forearms and wrist.  History obtained from patient and past medical records.  No interpreter is used.  Per facility, [facility], patient at baseline mentation.     HPI  Past Medical History:  Diagnosis Date  . CVA (cerebral infarction)   . Depression with anxiety   . Diabetes mellitus   . Dyslipidemia   . Fibromyalgia   . Hypertension   . TIA (transient ischemic attack)     Patient Active Problem List   Diagnosis Date Noted  . Hypomagnesemia 11/12/2017  . Aspiration into airway   . Acute respiratory distress   . Lung crackles   . Ileus (HCC)   . Abdominal distension   . Leukocytosis   . Diarrhea   . UTI (urinary tract infection) 10/31/2017  . Hypokalemia 10/31/2017  . 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 Spastic hemiparesis (HCC)  01/12/2015  . HLD (hyperlipidemia)   . History of recent stroke   . Accelerated hypertension   . Acute cystitis without hematuria   . Tachycardia   . Stroke (HCC) 11/17/2014  . Left-sided weakness 11/17/2014  . 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 CVA (cerebral vascular accident) (HCC) 10/14/2014  . CVA (cerebral infarction)   . TIA (transient ischemic attack) 10/13/2014  . Chest pain 08/07/2011  . Fibromyalgia 08/07/2011  . Costochondritis 08/07/2011  . Diabetes mellitus (HCC) 08/07/2011  . Depression with anxiety 08/07/2011  . Obesity 08/07/2011  . History of TIAs 08/07/2011  . Hyperlipidemia 08/07/2011    Past Surgical History:  Procedure Laterality Date  . ABDOMINAL HYSTERECTOMY     age 58, "removed one ovary- excessive bleeding"  . CESAREAN SECTION    . CHOLECYSTECTOMY    . LOOP RECORDER IMPLANT     Dr. Johney Frame placed 10/15/2014  . TEE WITHOUT CARDIOVERSION N/A 10/15/2014   Procedure: TRANSESOPHAGEAL ECHOCARDIOGRAM (TEE);  Surgeon: Pricilla Riffle, MD;  Location: [facility] ENDOSCOPY;  Service: Cardiovascular;  Laterality: N/A;     OB History   No obstetric history on file.      Home Medications    Prior to Admission medications   Medication Sig Start Date End Date Taking? Authorizing Provider  acetaminophen (TYLENOL) 500 MG tablet Take 1,000 mg by mouth 2 (two) times daily as needed for moderate pain.    Yes [provider]  aspirin EC 81 MG tablet Take  81 mg by mouth daily.   Yes [provider]  atorvastatin (LIPITOR) 20 MG tablet Take 1 tablet (20 mg total) by mouth daily at 6 PM. 10/18/14  Yes Sharl Ma, Sarina Ill, MD  clopidogrel (PLAVIX) 75 MG tablet Take 1 tablet (75 mg total) by mouth daily. 10/18/14  Yes Meredeth Ide, MD  cyanocobalamin (,VITAMIN B-12,) 1000 MCG/ML injection Inject 1 mL (1,000 mcg total) into the muscle every 30 (thirty) days. 12/11/17  Yes Dhungel, Nishant, MD  esomeprazole (NEXIUM) 40 MG packet Take 40 mg by  mouth daily before breakfast.   Yes [provider]  feeding supplement, GLUCERNA SHAKE, (GLUCERNA SHAKE) LIQD Take 237 mLs by mouth 3 (three) times daily between meals. 11/14/17  Yes Dhungel, Nishant, MD  furosemide (LASIX) 20 MG tablet Take 60 mg by mouth daily.    Yes [provider]  gabapentin (NEURONTIN) 300 MG capsule Take 1 capsule (300 mg total) by mouth at bedtime. 10/18/14  Yes Meredeth Ide, MD  insulin glargine (LANTUS) 100 UNIT/ML injection Inject 0.15 mLs (15 Units total) into the skin at bedtime. Patient taking differently: Inject 22 Units into the skin at bedtime.  11/14/17  Yes Dhungel, Nishant, MD  insulin regular (NOVOLIN R,HUMULIN R) 100 units/mL injection Inject 0.05 mLs (5 Units total) into the skin 3 (three) times daily before meals. 11/14/17  Yes Dhungel, Nishant, MD  levothyroxine (SYNTHROID, LEVOTHROID) 112 MCG tablet Take 112 mcg by mouth daily.    Yes [provider]  LORazepam (ATIVAN) 0.5 MG tablet Take one tablet by mouth twice daily for anxiety Patient taking differently: Take 0.5 mg by mouth every morning.  11/14/17  Yes Dhungel, Nishant, MD  losartan (COZAAR) 100 MG tablet Take 100 mg by mouth daily.     Yes [provider]  metoprolol tartrate (LOPRESSOR) 50 MG tablet Take 25-50 mg by mouth See admin instructions. Patient takes  in the morning and  in the evening    Yes [provider]  Multiple Vitamins-Minerals (MULTIVITAMIN WITH MINERALS) tablet Take 1 tablet by mouth daily.   Yes [provider]  nitroGLYCERIN (NITROSTAT) 0.4 MG SL tablet Place 0.4 mg under the tongue every 5 (five) minutes as needed for chest pain.   Yes [provider]  oxyCODONE (OXY IR/ROXICODONE) 5 MG immediate release tablet Take 1 tablet (5 mg total) by mouth 2 (two) times daily. 11/14/17  Yes Dhungel, Nishant, MD  potassium chloride SA (K-DUR,KLOR-CON) 20 MEQ tablet Take 20 mEq by mouth daily.     Yes [provider]  sennosides-docusate sodium (SENOKOT-S) 8.6-50 MG tablet Take 1 tablet by mouth at bedtime.    Yes [provider]  sertraline (ZOLOFT) 100 MG tablet Take 100 mg by mouth daily.    Yes [provider]  Amino Acids-Protein Hydrolys (FEEDING SUPPLEMENT, PRO-STAT SUGAR FREE 64,) LIQD Take 30 mLs by mouth daily.    [provider]  HYDROcodone-acetaminophen (NORCO/VICODIN) 5-325 MG tablet Take 1 tablet by mouth every 4 (four) hours as needed. 02/21/19   Eleora Sutherland A, PA-C    Family History Family History  Problem Relation Age of Onset  . Heart disease Father   . Hypertension Sister   . Colon cancer Neg Hx   . Colon polyps Neg Hx     Social History Social History   Tobacco Use  . Smoking status: Never Smoker  . Smokeless tobacco: Never Used  Substance Use Topics  . Alcohol use: No  . Drug  use: No     Allergies   Ivp dye [iodinated diagnostic agents] and Penicillins   Review of Systems Review of Systems  Constitutional: Negative.   HENT: Negative.   Eyes: Negative.   Respiratory: Negative.   Cardiovascular: Negative.   Gastrointestinal: Negative.   Genitourinary: Negative.   Musculoskeletal: Negative for arthralgias, back pain, neck pain and neck stiffness. Gait problem: Wheelchair bound.       Left shoulder pain  Skin: Negative.   Neurological: Negative.   All other systems reviewed and are negative.    Physical Exam Updated Vital Signs BP 140/77   Pulse 74   Temp 98.2 F (36.8 C) (Oral)   Resp 14   Ht 5' (1.524 m)   Wt 90.7 kg   SpO2 98%   BMI 39.06 kg/m   Physical Exam Vitals signs and nursing note reviewed.  Constitutional:      General: She is not in acute distress.    Appearance: She is well-developed. She is not ill-appearing, toxic-appearing or diaphoretic.  HENT:     Head: Normocephalic and atraumatic.     Nose: Nose normal.     Mouth/Throat:     Mouth: Mucous membranes are moist.     Pharynx: Oropharynx is  clear.  Eyes:     Pupils: Pupils are equal, round, and reactive to light.     Comments: Irregular left pupil.  Neck:     Musculoskeletal: Full passive range of motion without pain, normal range of motion and neck supple.     Comments: No neck stiffness or neck rigidity.  Phonation normal. Cardiovascular:     Rate and Rhythm: Normal rate.     Pulses: Normal pulses.     Heart sounds: Normal heart sounds.  Pulmonary:     Effort: Pulmonary effort is normal. No respiratory distress.     Breath sounds: Normal breath sounds and air entry.     Comments: Clear to auscultation bilateral without wheeze, rhonchi or rales.  No accessory muscle usage.  Speaks in full sentences without difficulty. Abdominal:     General: There is no distension.     Palpations: Abdomen is soft.     Tenderness: There is no abdominal tenderness.     Comments: Soft, nontender without rebound or guarding.  Normoactive bowel sounds.  No abdominal wall skin changes  Musculoskeletal: Normal range of motion.     Comments: Moves bilateral lower extremities without difficulty.  Weakness to left lower extremity which is chronic in nature.  Flexes and extends at bilateral elbows and wrists.  Unable to overhead raise to left shoulder secondary to pain.  Tenderness to proximal humerus.  2+ radial pulses bilaterally.  Skin:    General: Skin is warm and dry.     Comments: No overlying skin changes, no rashes or lesions.  No hematomas, ecchymosis or lacerations.  Neurological:     General: No focal deficit present.     Mental Status: She is alert.     Comments: Cranial nerves II through XII grossly intact.  No facial droop.  Phonation normal.  Oriented to person and place however not time.    ED Treatments / Results  Labs (all labs ordered are listed, but only abnormal results are displayed) Labs Reviewed  CBC WITH DIFFERENTIAL/PLATELET - Abnormal; Notable for the following components:      Result Value   WBC 13.2 (*)    Neutro  Abs 10.2 (*)    All other components within normal limits  BASIC METABOLIC PANEL - Abnormal; Notable for the following components:   Potassium 3.3 (*)    Glucose, Bld 155 (*)    Creatinine, Ser 1.22 (*)    GFR calc non Af Amer 45 (*)    GFR calc Af Amer 52 (*)    All other components within normal limits    EKG EKG Interpretation  Date/Time:  Saturday February 21 2019 21:15:55 EDT Ventricular Rate:  74 PR Interval:    QRS Duration: 124 QT Interval:  414 QTC Calculation: 460 R Axis:   -52 Text Interpretation:  Sinus rhythm Ventricular premature complex RBBB and LAFB LVH with secondary repolarization abnormality Confirmed by Bethann BerkshireZammit, Joseph [phone]) on 02/21/2019 10:26:32 PM   Radiology Dg Elbow Complete Left  Result Date: 02/21/2019 CLINICAL DATA:  History of multiple falls EXAM: LEFT ELBOW - COMPLETE 3+ VIEW COMPARISON:  11/17/2010 FINDINGS: Bones appear osteopenic. This limits evaluation for fracture. Nonstandard lateral view. No dislocation. No obvious fracture or joint effusion. IMPRESSION: Study limited by osteopenia and positioning. No gross fracture or malalignment is seen Electronically Signed   By: Jasmine PangKim  Fujinaga M.D.   On: 02/21/2019 20:56   Ct Head Wo Contrast  Result Date: 02/21/2019 CLINICAL DATA:  History of multiple fall EXAM: CT HEAD WITHOUT CONTRAST CT CERVICAL SPINE WITHOUT CONTRAST TECHNIQUE: Multidetector CT imaging of the head and cervical spine was performed following the standard protocol without intravenous contrast. Multiplanar CT image reconstructions of the cervical spine were also generated. COMPARISON:  MRI 10/31/2017, CT brain 10/31/2017 FINDINGS: CT HEAD FINDINGS Brain: No acute territorial infarction, hemorrhage, or intracranial mass. Atrophy and small vessel ischemic changes of the white matter. Chronic infarcts involving the left basal ganglia, and bilateral white matter. Stable ventricle size with mild ex vacuo dilatation of left lateral ventricle. Vascular: No  hyperdense vessels. Vertebral and carotid vascular calcification Skull: Normal. Negative for fracture or focal lesion. Sinuses/Orbits: No acute finding. Other: None CT CERVICAL SPINE FINDINGS Alignment: Normal. Skull base and vertebrae: No acute fracture. No primary bone lesion or focal pathologic process. Soft tissues and spinal canal: No prevertebral fluid or swelling. No visible canal hematoma. Disc levels:  Mild degenerative changes at C4-C5 and C5-C6. Upper chest: Negative. Other: None IMPRESSION: 1. No CT evidence for acute intracranial abnormality. Atrophy, chronic small vessel ischemic changes of the white matter and chronic multifocal infarcts. 2. No acute osseous abnormality of the cervical spine Electronically Signed   By: Jasmine PangKim  Fujinaga M.D.   On: 02/21/2019 20:53   Ct Cervical Spine Wo Contrast  Result Date: 02/21/2019 CLINICAL DATA:  History of multiple fall EXAM: CT HEAD WITHOUT CONTRAST CT CERVICAL SPINE WITHOUT CONTRAST TECHNIQUE: Multidetector CT imaging of the head and cervical spine was performed following the standard protocol without intravenous contrast. Multiplanar CT image reconstructions of the cervical spine were also generated. COMPARISON:  MRI 10/31/2017, CT brain 10/31/2017 FINDINGS: CT HEAD FINDINGS Brain: No acute territorial infarction, hemorrhage, or intracranial mass. Atrophy and small vessel ischemic changes of the white matter. Chronic infarcts involving the left basal ganglia, and bilateral white matter. Stable ventricle size with mild ex vacuo dilatation of left lateral ventricle. Vascular: No hyperdense vessels. Vertebral and carotid vascular calcification Skull: Normal. Negative for fracture or focal lesion. Sinuses/Orbits: No acute finding. Other: None CT CERVICAL SPINE FINDINGS Alignment: Normal. Skull base and vertebrae: No acute fracture. No primary bone lesion or focal pathologic process. Soft tissues and spinal canal: No prevertebral fluid or swelling. No visible  canal hematoma. Disc levels:  Mild degenerative changes at C4-C5 and C5-C6. Upper chest: Negative. Other: None IMPRESSION: 1. No CT evidence for acute intracranial abnormality. Atrophy, chronic small vessel ischemic changes of the white matter and chronic multifocal infarcts. 2. No acute osseous abnormality of the cervical spine Electronically Signed   By: Donavan Foil M.D.   On: 02/21/2019 20:53   Dg Shoulder Left  Result Date: 02/21/2019 CLINICAL DATA:  Status post fall with left shoulder pain. EXAM: LEFT SHOULDER - 2+ VIEW COMPARISON:  None. FINDINGS: There is displaced fracture of the proximal left humerus probably at the neck. The visualized left ribs are. IMPRESSION: Fracture of proximal left humerus. Electronically Signed   By: Abelardo Diesel M.D.   On: 02/21/2019 19:51   Procedures Procedures (including critical care time)  Medications Ordered in ED Medications  morphine 4 MG/ML injection 4 mg (4 mg Intravenous Given 02/21/19 2103)  ondansetron (ZOFRAN) injection 4 mg (4 mg Intravenous Given 02/21/19 2100)   Initial Impression / Assessment and Plan / ED Course  I have reviewed the triage vital signs and the nursing notes.  Pertinent labs & imaging results that were available during my care of the patient were reviewed by me and considered in my medical decision making (see chart for details).  68 year old female appears otherwise well presents for evaluation after mechanical fall.  Possible hitting posterior head.  She has residual left-sided weakness secondary to CVA.  She is chronically wheelchair-bound.  Patient with tenderness to proximal humerus, left shoulder.  Unable to raise overhead secondary to pain.  Otherwise she moves 4 extremities at difficulty.  Neurovascularly intact.  2+ radial pulses bilaterally.  No overlying skin changes.  She does have irregular left pupil.  No history of cataract surgery.  I do not see in her previous notes any notes of irregular pupil.  Given possible  head injury obtain CT scan.  Will also obtain baseline labs, EKG and xray to assess for fracture or dislocation.  Imaging and personally reviewed. CBC with mild leukocytosis at 13.2 BMP with mild hypokalemia at 3.3, glucose at 155, creatinine 1.22, hypokalemia likely related to Lasix, she is on PO potassium replacement.  Discussed continue taking this and follow-up with PCP.  Also discussed proper oral hydration given mildly elevated creatinine. CT head without acute findings CT cervical spine without any acute findings Plain film left elbow without acute fracture or dislocation or effusion. Plain film left shoulder with proximal humerus fracture.  No associated displacement.  No evidence of neurovascular compromise.  Will place patient in sling and have her follow-up with orthopedics.  Pain controlled in the ED.  Reevaluation pain controlled in ED.  She is at baseline mentation, per facility.  Labs at baseline.  EKG without ST/T changes.  No STEMI.  Patient provided sling and pain management.  Will need to follow-up with orthopedics.  Discussed with her facility discharge instructions.  No evidence of compartment syndrome or neurovascular compromise.  The patient has been appropriately medically screened and/or stabilized in the ED. I have low suspicion for any other emergent medical condition which would require further screening, evaluation or treatment in the ED or require inpatient management.  Patient is hemodynamically stable and in no acute distress.  Patient able to ambulate in department prior to ED.  Evaluation does not show acute pathology that would require ongoing or additional emergent interventions while in the emergency department or further inpatient treatment.  I have discussed the diagnosis with the patient and answered all questions.  Pain is been managed while in the emergency department and patient has no further complaints prior to discharge.  Patient is comfortable with plan  discussed in room and is stable for discharge at this time.  I have discussed strict return precautions for returning to the emergency department.  Patient was encouraged to follow-up with PCP/specialist refer to at discharge.      Patient has been seen and evaluated by attending physician, Dr. Malissa HippoZeman who agrees with the treatment, plan and disposition. Final Clinical Impressions(s) / ED Diagnoses   Final diagnoses:  Fall, initial encounter  Closed fracture of proximal end of left humerus, unspecified fracture morphology, initial encounter    ED Discharge Orders         Ordered    HYDROcodone-acetaminophen (NORCO/VICODIN) 5-325 MG tablet  Every 4 hours PRN     02/21/19 2215           Charnese Federici A, PA-C 02/21/19 2310    Bethann BerkshireZammit, Joseph, MD 02/22/19 1000

## 2019-02-21 NOTE — ED Notes (Signed)
Call to [facility]   Unable to speak with caregiver for update

## 2019-02-21 NOTE — ED Notes (Signed)
Report to Dawn Ho with prescription and follow up   She will relay rx and referral to staff as the followup update was unable to be given

## 2019-02-21 NOTE — ED Notes (Signed)
Pt to Rad 

## 2019-02-21 NOTE — ED Notes (Signed)
Report to Gwendolyn Lima [facility]

## 2019-02-25 ENCOUNTER — Encounter: Payer: Self-pay | Admitting: Orthopedic Surgery

## 2019-02-25 ENCOUNTER — Ambulatory Visit (INDEPENDENT_AMBULATORY_CARE_PROVIDER_SITE_OTHER): Payer: Medicare Other | Admitting: Orthopedic Surgery

## 2019-02-25 ENCOUNTER — Other Ambulatory Visit: Payer: Self-pay

## 2019-02-25 DIAGNOSIS — S42292A Other displaced fracture of upper end of left humerus, initial encounter for closed fracture: Secondary | ICD-10-CM

## 2019-02-26 ENCOUNTER — Encounter: Payer: Self-pay | Admitting: Orthopedic Surgery

## 2019-02-26 NOTE — Progress Notes (Signed)
Office Visit Note   Patient: Dawn Ho           Date of Birth: [birth year]           MRN: [id number] Visit Date: 02/25/2019 Requested by: Bernerd Limbo, MD [address] [place],  [place] [address] PCP: Bernerd Limbo, MD  Subjective: No chief complaint on file.   HPI: Pt is a 68 y.o. Female who presents to the clinic complaining of L shoulder pain.  She was injured on 02/21/2019 after she fell while trying to stand up from a sitting position.  She was seen in the ED and had x-rays which revealed a L proximal humerus fracture.  She was placed in a sling and referred to this office. She has pain in the L shoulder with any manipulation that she tries.  Denies pain elsewhere.  History is limited due to patient's condition.  She has difficulty responding to questions.  Pt has a history of a CVA in the past that has left her with L-sided weakness as well.                ROS: All systems reviewed are negative as they relate to the chief complaint within the history of present illness.  Patient denies  fevers or chills.   Assessment & Plan: Visit Diagnoses: No diagnosis found.  Plan: Pt is complaining of L shoulder pain following a minimally displaced proximal humerus fracture.  I feel it is appropriate to treat this fracture with nonoperative management in a sling. She will return to the office in 3 weeks so that I may examine her shoulder further, and make sure that the fracture continues to heal.  No evidence of any elbow, forearm, wrist, hand pain on her LUE.    Follow-Up Instructions: No follow-ups on file.   Orders:  No orders of the defined types were placed in this encounter.  No orders of the defined types were placed in this encounter.     Procedures: No procedures performed   Clinical Data: No additional findings.  Objective: Vital Signs: There were no vitals taken for this visit.  Physical Exam:   Constitutional: Patient appears well-developed  HEENT:  Head: Normocephalic Eyes:EOM are normal Neck: Normal range of motion Cardiovascular: Normal rate Pulmonary/chest: Effort normal Neurologic: Patient is inattentive but alert Skin: Skin is warm Psychiatric: Patient has normal mood and affect    Ortho Exam:  LUE exam L arm is in a sling L shoulder is painful to palpation diffusely.   Unable to ROM the L shoulder at all without severe pain Mild pain in the L shoulder when extending/flexing the L elbow No pain to palpation in the forearm, wrist, hand, fingers No evidence of blisters or skin breakdown of the L shoulder  Specialty Comments:  No specialty comments available.  Imaging: No results found.   PMFS History: Patient Active Problem List   Diagnosis Date Noted  . Hypomagnesemia 11/12/2017  . Aspiration into airway   . Acute respiratory distress   . Lung crackles   . Ileus (HCC)   . Abdominal distension   . Leukocytosis   . Diarrhea   . UTI (urinary tract infection) 10/31/2017  . Hypokalemia 10/31/2017  . 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 Spastic hemiparesis (HCC) 01/12/2015  . HLD (hyperlipidemia)   . History of recent stroke   . Accelerated hypertension   . Acute cystitis without hematuria   . Tachycardia   . Stroke (HCC) 11/17/2014  .  Left-sided weakness 11/17/2014  . 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 CVA (cerebral vascular accident) (HCC) 10/14/2014  . CVA (cerebral infarction)   . TIA (transient ischemic attack) 10/13/2014  . Chest pain 08/07/2011  . Fibromyalgia 08/07/2011  . Costochondritis 08/07/2011  . Diabetes mellitus (HCC) 08/07/2011  . Depression with anxiety 08/07/2011  . Obesity 08/07/2011  . History of TIAs 08/07/2011  . Hyperlipidemia 08/07/2011   Past Medical History:  Diagnosis Date  . CVA (cerebral infarction)   . Depression with anxiety   . Diabetes mellitus   . Dyslipidemia   . Fibromyalgia   . Hypertension   . TIA (transient  ischemic attack)     Family History  Problem Relation Age of Onset  . Heart disease Father   . Hypertension Sister   . Colon cancer Neg Hx   . Colon polyps Neg Hx     Past Surgical History:  Procedure Laterality Date  . ABDOMINAL HYSTERECTOMY     age 39, "removed one ovary- excessive bleeding"  . CESAREAN SECTION    . CHOLECYSTECTOMY    . LOOP RECORDER IMPLANT     Dr. Johney Frame placed 10/15/2014  . TEE WITHOUT CARDIOVERSION N/A 10/15/2014   Procedure: TRANSESOPHAGEAL ECHOCARDIOGRAM (TEE);  Surgeon: Pricilla Riffle, MD;  Location: [facility] ENDOSCOPY;  Service: Cardiovascular;  Laterality: N/A;   Social History   Occupational History  . Occupation: retired    Comment: Agricultural engineer   Tobacco Use  . Smoking status: Never Smoker  . Smokeless tobacco: Never Used  Substance and Sexual Activity  . Alcohol use: No  . Drug use: No  . Sexual activity: Not Currently

## 2019-02-27 ENCOUNTER — Telehealth: Payer: Self-pay

## 2019-02-27 NOTE — Telephone Encounter (Signed)
nwb

## 2019-02-27 NOTE — Telephone Encounter (Signed)
Megan with [facility] would like to know patient's WB status for her left humerus? Would like this information faxed to [phone], attn:Leah?  Cb# is [phone].  Please advise.  Thank you.

## 2019-02-27 NOTE — Telephone Encounter (Signed)
Please advise. Thanks.  

## 2019-03-02 NOTE — Telephone Encounter (Signed)
IC Jinny Blossom was not available. LM with other nurse advising per Dr Marlou Sa

## 2019-03-16 ENCOUNTER — Telehealth: Payer: Self-pay | Admitting: Orthopedic Surgery

## 2019-03-16 NOTE — Telephone Encounter (Signed)
[facility] called to see if pt and do virtual visit since she had xrays last week. The are trying not to bring patient out if not necessary.   Patient came last week because she fell.  [phone]

## 2019-03-16 NOTE — Telephone Encounter (Signed)
Tried calling, no asnwer. LMVM advising Dr Marlou Sa does do virtual visit.

## 2019-03-18 ENCOUNTER — Other Ambulatory Visit: Payer: Self-pay

## 2019-03-18 ENCOUNTER — Encounter: Payer: Self-pay | Admitting: Orthopedic Surgery

## 2019-03-18 ENCOUNTER — Ambulatory Visit (INDEPENDENT_AMBULATORY_CARE_PROVIDER_SITE_OTHER): Payer: Medicare Other

## 2019-03-18 ENCOUNTER — Ambulatory Visit (INDEPENDENT_AMBULATORY_CARE_PROVIDER_SITE_OTHER): Payer: Medicare Other | Admitting: Orthopedic Surgery

## 2019-03-18 DIAGNOSIS — S42292A Other displaced fracture of upper end of left humerus, initial encounter for closed fracture: Secondary | ICD-10-CM

## 2019-03-18 NOTE — Progress Notes (Signed)
Post-Op Visit Note   Patient: Dawn Ho           Date of Birth: Mar 17, 1951           MRN: [id number] Visit Date: 03/18/2019 PCP: Bernerd Limbo, MD   Assessment & Plan:  Chief Complaint:  Chief Complaint  Patient presents with  . Follow-up   Visit Diagnoses:  1. Other closed displaced fracture of proximal end of left humerus, initial encounter     Plan: Patient is a 68 year old female returning to the office 3 weeks after her last office visit.  She suffered a left proximal humerus fracture on 02/21/2019.  Her pain has improved greatly since the last appointment.  She still has some soreness with range of motion of the arm, but the fracture site appears to move as a unit on exam today.  2+ radial pulse and axillary sensation is intact.  She is able to extend her wrist on exam.  She has been compliant with the sling according to the caretaker from her assisted living facility.  Her x-rays reveal no evidence of significant displacement.  Recommended that patient now work on active elbow range of motion as well as have therapy work with her at her assisted living facility on passive range of motion of the left shoulder.  Patient is doing relatively well and she will follow-up in 3 weeks for the last x-ray check before release.  Follow-Up Instructions: No follow-ups on file.   Orders:  Orders Placed This Encounter  Procedures  . XR Shoulder Left   No orders of the defined types were placed in this encounter.   Imaging: No results found.  PMFS History: Patient Active Problem List   Diagnosis Date Noted  . Hypomagnesemia 11/12/2017  . Aspiration into airway   . Acute respiratory distress   . Lung crackles   . Ileus (HCC)   . Abdominal distension   . Leukocytosis   . Diarrhea   . UTI (urinary tract infection) 10/31/2017  . Hypokalemia 10/31/2017  . 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 Spastic hemiparesis (HCC) 01/12/2015  . HLD (hyperlipidemia)   . History  of recent stroke   . Accelerated hypertension   . Acute cystitis without hematuria   . Tachycardia   . Stroke (HCC) 11/17/2014  . Left-sided weakness 11/17/2014  . 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 CVA (cerebral vascular accident) (HCC) 10/14/2014  . CVA (cerebral infarction)   . TIA (transient ischemic attack) 10/13/2014  . Chest pain 08/07/2011  . Fibromyalgia 08/07/2011  . Costochondritis 08/07/2011  . Diabetes mellitus (HCC) 08/07/2011  . Depression with anxiety 08/07/2011  . Obesity 08/07/2011  . History of TIAs 08/07/2011  . Hyperlipidemia 08/07/2011   Past Medical History:  Diagnosis Date  . CVA (cerebral infarction)   . Depression with anxiety   . Diabetes mellitus   . Dyslipidemia   . Fibromyalgia   . Hypertension   . TIA (transient ischemic attack)     Family History  Problem Relation Age of Onset  . Heart disease Father   . Hypertension Sister   . Colon cancer Neg Hx   . Colon polyps Neg Hx     Past Surgical History:  Procedure Laterality Date  . ABDOMINAL HYSTERECTOMY     age 82, "removed one ovary- excessive bleeding"  . CESAREAN SECTION    . CHOLECYSTECTOMY    . LOOP RECORDER IMPLANT     Dr. Johney Frame placed  10/15/2014  . TEE WITHOUT CARDIOVERSION N/A 10/15/2014   Procedure: TRANSESOPHAGEAL ECHOCARDIOGRAM (TEE);  Surgeon: Pricilla Riffle, MD;  Location: [facility] ENDOSCOPY;  Service: Cardiovascular;  Laterality: N/A;   Social History   Occupational History  . Occupation: retired    Comment: Agricultural engineer   Tobacco Use  . Smoking status: Never Smoker  . Smokeless tobacco: Never Used  Substance and Sexual Activity  . Alcohol use: No  . Drug use: No  . Sexual activity: Not Currently

## 2019-04-08 ENCOUNTER — Ambulatory Visit: Payer: Medicare Other | Admitting: Orthopedic Surgery

## 2019-04-24 ENCOUNTER — Telehealth: Payer: Self-pay

## 2019-04-24 NOTE — Telephone Encounter (Signed)
[facility] called stating patient has been crying out in pain with her arm, and has more bruising all of a sudden. They did an xray and found a new fracture They are asking if this is something you want to see in the office? They are emailing me the report and images and I will lay them on your desk.

## 2019-04-24 NOTE — Telephone Encounter (Signed)
Yes we can see her in the office on Monday

## 2019-04-28 ENCOUNTER — Telehealth: Payer: Self-pay

## 2019-04-28 NOTE — Telephone Encounter (Signed)
Dawn Ho with [facility] was calling to see if patient could be seen by Dr. Marlou Sa.  Per Dr. Randel Pigg last message, he did want patient to come in to be seen.  Lonn Georgia stated that there facility does have COVID, but patient is Negative and testing is done weekly.  Please advise.  Cb# is [phone].  Thank you.

## 2019-04-28 NOTE — Telephone Encounter (Signed)
See other note

## 2019-04-28 NOTE — Telephone Encounter (Signed)
I'm giving this to you, so you can add her in

## 2019-04-28 NOTE — Telephone Encounter (Signed)
We can see her but if her temp is over 99 when she gets here then we will not be able to see her.  We will need to be extra careful with her.

## 2019-04-28 NOTE — Telephone Encounter (Signed)
Please advise. Thanks.  

## 2019-04-28 NOTE — Telephone Encounter (Signed)
appt scheduled

## 2019-04-29 ENCOUNTER — Ambulatory Visit (INDEPENDENT_AMBULATORY_CARE_PROVIDER_SITE_OTHER): Payer: Medicare Other | Admitting: Orthopedic Surgery

## 2019-04-29 ENCOUNTER — Encounter: Payer: Self-pay | Admitting: Orthopedic Surgery

## 2019-04-29 ENCOUNTER — Ambulatory Visit (INDEPENDENT_AMBULATORY_CARE_PROVIDER_SITE_OTHER): Payer: Medicare Other

## 2019-04-29 DIAGNOSIS — S42292A Other displaced fracture of upper end of left humerus, initial encounter for closed fracture: Secondary | ICD-10-CM

## 2019-04-29 NOTE — Progress Notes (Signed)
Post-Op Visit Note   Patient: Dawn Ho           Date of Birth: [birth year]           MRN: [id number] Visit Date: 04/29/2019 PCP: Bernerd Limbo, MD   Assessment & Plan:  Chief Complaint:  Chief Complaint  Patient presents with  . Left Shoulder - Pain   Visit Diagnoses:  1. Other closed displaced fracture of proximal end of left humerus, initial encounter     Plan: [name] is now several months out left proximal humerus fracture.  On exam still has some pain with passive range of motion but the fracture is moving as a unit.  I want to have her start physical therapy for shoulder motion.  Radiographs look good today.  No pain with left wrist range of motion.  She has had a stroke on that side.  Follow-up with me as needed.  Follow-Up Instructions: Return if symptoms worsen or fail to improve.   Orders:  Orders Placed This Encounter  Procedures  . XR Shoulder Left   No orders of the defined types were placed in this encounter.   Imaging: Xr Shoulder Left  Result Date: 04/29/2019 Multiple views left shoulder reviewed.  Again noted is proximal left humerus fracture unchanged in appearance with increased callus formation.  No dislocation is present.  No other abnormalities present.   PMFS History: Patient Active Problem List   Diagnosis Date Noted  . Hypomagnesemia 11/12/2017  . Aspiration into airway   . Acute respiratory distress   . Lung crackles   . Ileus (HCC)   . Abdominal distension   . Leukocytosis   . Diarrhea   . UTI (urinary tract infection) 10/31/2017  . Hypokalemia 10/31/2017  . 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 Spastic hemiparesis (HCC) 01/12/2015  . HLD (hyperlipidemia)   . History of recent stroke   . Accelerated hypertension   . Acute cystitis without hematuria   . Tachycardia   . Stroke (HCC) 11/17/2014  . Left-sided weakness 11/17/2014  . 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 CVA (cerebral vascular  accident) (HCC) 10/14/2014  . CVA (cerebral infarction)   . TIA (transient ischemic attack) 10/13/2014  . Chest pain 08/07/2011  . Fibromyalgia 08/07/2011  . Costochondritis 08/07/2011  . Diabetes mellitus (HCC) 08/07/2011  . Depression with anxiety 08/07/2011  . Obesity 08/07/2011  . History of TIAs 08/07/2011  . Hyperlipidemia 08/07/2011   Past Medical History:  Diagnosis Date  . CVA (cerebral infarction)   . Depression with anxiety   . Diabetes mellitus   . Dyslipidemia   . Fibromyalgia   . Hypertension   . TIA (transient ischemic attack)     Family History  Problem Relation Age of Onset  . Heart disease Father   . Hypertension Sister   . Colon cancer Neg Hx   . Colon polyps Neg Hx     Past Surgical History:  Procedure Laterality Date  . ABDOMINAL HYSTERECTOMY     age 34, "removed one ovary- excessive bleeding"  . CESAREAN SECTION    . CHOLECYSTECTOMY    . LOOP RECORDER IMPLANT     Dr. Johney Frame placed 10/15/2014  . TEE WITHOUT CARDIOVERSION N/A 10/15/2014   Procedure: TRANSESOPHAGEAL ECHOCARDIOGRAM (TEE);  Surgeon: Pricilla Riffle, MD;  Location: [facility] ENDOSCOPY;  Service: Cardiovascular;  Laterality: N/A;   Social History   Occupational History  . Occupation: retired    Comment: Agricultural engineer  Tobacco Use  . Smoking status: Never Smoker  . Smokeless tobacco: Never Used  Substance and Sexual Activity  . Alcohol use: No  . Drug use: No  . Sexual activity: Not Currently

## 2020-08-27 ENCOUNTER — Other Ambulatory Visit: Payer: Self-pay

## 2020-08-27 ENCOUNTER — Emergency Department (HOSPITAL_COMMUNITY)
Admission: EM | Admit: 2020-08-27 | Discharge: 2020-08-28 | Disposition: A | Discharge status: Other Acute Inpatient Hospital | Payer: Medicare Other | Attending: Emergency Medicine | Admitting: Emergency Medicine

## 2020-08-27 ENCOUNTER — Encounter (HOSPITAL_COMMUNITY): Payer: Self-pay | Admitting: Emergency Medicine

## 2020-08-27 DIAGNOSIS — Z7982 Long term (current) use of aspirin: Secondary | ICD-10-CM | POA: Insufficient documentation

## 2020-08-27 DIAGNOSIS — E11319 Type 2 diabetes mellitus with unspecified diabetic retinopathy without macular edema: Secondary | ICD-10-CM | POA: Insufficient documentation

## 2020-08-27 DIAGNOSIS — R531 Weakness: Secondary | ICD-10-CM | POA: Diagnosis not present

## 2020-08-27 DIAGNOSIS — Z794 Long term (current) use of insulin: Secondary | ICD-10-CM | POA: Diagnosis not present

## 2020-08-27 DIAGNOSIS — I10 Essential (primary) hypertension: Secondary | ICD-10-CM | POA: Diagnosis not present

## 2020-08-27 DIAGNOSIS — H5713 Ocular pain, bilateral: Secondary | ICD-10-CM | POA: Diagnosis present

## 2020-08-27 DIAGNOSIS — H5702 Anisocoria: Secondary | ICD-10-CM | POA: Diagnosis not present

## 2020-08-27 DIAGNOSIS — Z8673 Personal history of transient ischemic attack (TIA), and cerebral infarction without residual deficits: Secondary | ICD-10-CM | POA: Diagnosis not present

## 2020-08-27 DIAGNOSIS — Z79899 Other long term (current) drug therapy: Secondary | ICD-10-CM | POA: Insufficient documentation

## 2020-08-27 HISTORY — DX: Cerebral infarction, unspecified: I63.9

## 2020-08-27 HISTORY — DX: Unspecified background retinopathy: H35.00

## 2020-08-27 HISTORY — DX: Unspecified cataract: H26.9

## 2020-08-27 NOTE — ED Notes (Signed)
Pt mentions that she "feels a jumping in her face". States it is all over face and that it has been ongoing x 1 week. Pt states her MD is aware.

## 2020-08-27 NOTE — ED Triage Notes (Signed)
Pt sent from [facility] after staff noticed pt's L pupil was irregular in shape (oblong) and non-reactive. Pt with hx of acute CVA with L sided hemiparesis. Pt alert and oriented at this time without any other complaints.

## 2020-08-28 NOTE — Discharge Instructions (Addendum)
Patient has a documented irregular left pupil and her ED record from February 21, 2019.  She has no other acute symptoms.  She has a documented cataract in her left eye.  She can have her eye rechecked by an ophthalmologist.

## 2020-08-28 NOTE — ED Provider Notes (Signed)
[facility] EMERGENCY DEPARTMENT Provider Note   CSN: [id number] Arrival date & time: 08/27/20  2321   Time seen 12:14 AM  History Chief Complaint  Patient presents with  . L pupil Irregularity    Dawn Ho is a 70 y.o. female.  HPI   Patient presents from her nursing facility because they noticed tonight that her left pupil is irregularly shaped and nonreactive.  Patient states that her eyes have been jumping that started about 2 days ago.  She states it can be either eye.  She denies any change in her eyesight, headache, new numbness or weakness.  She states she has chronic left-sided weakness from prior strokes.  She states she feels fine.  PCP Bernerd Limbo, MD   Past Medical History:  Diagnosis Date  . Cataract, left eye   . CVA (cerebral infarction)   . Depression with anxiety   . Diabetes mellitus   . Dyslipidemia   . Fibromyalgia   . Hypertension   . Retinopathy   . Stroke (HCC)    L. hemiparesis  . TIA (transient ischemic attack)     Patient Active Problem List   Diagnosis Date Noted  . Hypomagnesemia 11/12/2017  . Aspiration into airway   . Acute respiratory distress   . Lung crackles   . Ileus (HCC)   . Abdominal distension   . Leukocytosis   . Diarrhea   . UTI (urinary tract infection) 10/31/2017  . Hypokalemia 10/31/2017  . 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 Spastic hemiparesis (HCC) 01/12/2015  . HLD (hyperlipidemia)   . History of recent stroke   . Accelerated hypertension   . Acute cystitis without hematuria   . Tachycardia   . Stroke (HCC) 11/17/2014  . Left-sided weakness 11/17/2014  . 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 CVA (cerebral vascular accident) (HCC) 10/14/2014  . CVA (cerebral infarction)   . TIA (transient ischemic attack) 10/13/2014  . Chest pain 08/07/2011  . Fibromyalgia 08/07/2011  . Costochondritis 08/07/2011  . Diabetes mellitus (HCC) 08/07/2011  . Depression with anxiety  08/07/2011  . Obesity 08/07/2011  . History of TIAs 08/07/2011  . Hyperlipidemia 08/07/2011    Past Surgical History:  Procedure Laterality Date  . ABDOMINAL HYSTERECTOMY     age 16, "removed one ovary- excessive bleeding"  . CESAREAN SECTION    . CHOLECYSTECTOMY    . LOOP RECORDER IMPLANT     Dr. Johney Frame placed 10/15/2014  . TEE WITHOUT CARDIOVERSION N/A 10/15/2014   Procedure: TRANSESOPHAGEAL ECHOCARDIOGRAM (TEE);  Surgeon: Pricilla Riffle, MD;  Location: [facility] ENDOSCOPY;  Service: Cardiovascular;  Laterality: N/A;     OB History   No obstetric history on file.     Family History  Problem Relation Age of Onset  . Heart disease Father   . Hypertension Sister   . Colon cancer Neg Hx   . Colon polyps Neg Hx     Social History   Tobacco Use  . Smoking status: Never Smoker  . Smokeless tobacco: Never Used  Substance Use Topics  . Alcohol use: No  . Drug use: No    Home Medications Prior to Admission medications   Medication Sig Start Date End Date Taking? Authorizing Provider  acetaminophen (TYLENOL) 500 MG tablet Take 1,000 mg by mouth 2 (two) times daily as needed for moderate pain.     [provider]  Amino Acids-Protein Hydrolys (FEEDING SUPPLEMENT, PRO-STAT SUGAR FREE 64,) LIQD Take 30  mLs by mouth daily.    [provider]  aspirin EC 81 MG tablet Take 81 mg by mouth daily.    [provider]  atorvastatin (LIPITOR) 20 MG tablet Take 1 tablet (20 mg total) by mouth daily at 6 PM. 10/18/14   Meredeth IdeLama, Gagan S, MD  clopidogrel (PLAVIX) 75 MG tablet Take 1 tablet (75 mg total) by mouth daily. 10/18/14   Meredeth IdeLama, Gagan S, MD  cyanocobalamin (,VITAMIN B-12,) 1000 MCG/ML injection Inject 1 mL (1,000 mcg total) into the muscle every 30 (thirty) days. 12/11/17   Dhungel, Theda BelfastNishant, MD  esomeprazole (NEXIUM) 40 MG packet Take 40 mg by mouth daily before breakfast.    [provider]  feeding supplement, GLUCERNA SHAKE, (GLUCERNA SHAKE) LIQD Take 237 mLs by  mouth 3 (three) times daily between meals. 11/14/17   Dhungel, Nishant, MD  furosemide (LASIX) 20 MG tablet Take 60 mg by mouth daily.     [provider]  gabapentin (NEURONTIN) 300 MG capsule Take 1 capsule (300 mg total) by mouth at bedtime. 10/18/14   Meredeth IdeLama, Gagan S, MD  HYDROcodone-acetaminophen (NORCO/VICODIN) 5-325 MG tablet Take 1 tablet by mouth every 4 (four) hours as needed. 02/21/19   Henderly, Britni A, PA-C  insulin glargine (LANTUS) 100 UNIT/ML injection Inject 0.15 mLs (15 Units total) into the skin at bedtime. Patient taking differently: Inject 22 Units into the skin at bedtime.  11/14/17   Dhungel, Nishant, MD  insulin regular (NOVOLIN R,HUMULIN R) 100 units/mL injection Inject 0.05 mLs (5 Units total) into the skin 3 (three) times daily before meals. 11/14/17   Dhungel, Nishant, MD  levothyroxine (SYNTHROID, LEVOTHROID) 112 MCG tablet Take 112 mcg by mouth daily.     [provider]  LORazepam (ATIVAN) 0.5 MG tablet Take one tablet by mouth twice daily for anxiety Patient taking differently: Take 0.5 mg by mouth every morning.  11/14/17   Dhungel, Nishant, MD  losartan (COZAAR) 100 MG tablet Take 100 mg by mouth daily.      [provider]  metoprolol tartrate (LOPRESSOR) 50 MG tablet Take 25-50 mg by mouth See admin instructions. Patient takes 50mg  in the morning and 25mg  in the evening     [provider]  Multiple Vitamins-Minerals (MULTIVITAMIN WITH MINERALS) tablet Take 1 tablet by mouth daily.    [provider]  nitroGLYCERIN (NITROSTAT) 0.4 MG SL tablet Place 0.4 mg under the tongue every 5 (five) minutes as needed for chest pain.    [provider]  oxyCODONE (OXY IR/ROXICODONE) 5 MG immediate release tablet Take 1 tablet (5 mg total) by mouth 2 (two) times daily. 11/14/17   Dhungel, Nishant, MD  potassium chloride SA (K-DUR,KLOR-CON) 20 MEQ tablet Take 20 mEq by mouth daily.      [provider]  sennosides-docusate  sodium (SENOKOT-S) 8.6-50 MG tablet Take 1 tablet by mouth at bedtime.     [provider]  sertraline (ZOLOFT) 100 MG tablet Take 100 mg by mouth daily.     [provider]    Allergies    Ivp dye [iodinated diagnostic agents] and Penicillins  Review of Systems   Review of Systems  All other systems reviewed and are negative.   Physical Exam Updated Vital Signs BP (!) 158/81 (BP Location: Right Wrist)   Pulse 69   Temp 98.2 F (36.8 C) (Oral)   Resp 18   Ht 5' (1.524 m)   Wt 73.5 kg   SpO2 95%   BMI  31.64 kg/m   Physical Exam Vitals and nursing note reviewed.  Constitutional:      General: She is not in acute distress.    Appearance: Normal appearance. She is not ill-appearing or toxic-appearing.  HENT:     Head: Normocephalic and atraumatic.     Right Ear: External ear normal.     Left Ear: External ear normal.     Nose: Nose normal.  Eyes:     Extraocular Movements: Extraocular movements intact.     Conjunctiva/sclera: Conjunctivae normal.     Comments: Patient's left pupil is elongated and nonreactive.  Cardiovascular:     Rate and Rhythm: Normal rate and regular rhythm.     Pulses: Normal pulses.     Heart sounds: Normal heart sounds. No murmur heard.   Pulmonary:     Effort: Pulmonary effort is normal.     Breath sounds: Normal breath sounds.  Musculoskeletal:        General: No swelling.     Cervical back: Normal range of motion.  Skin:    General: Skin is warm and dry.  Neurological:     Mental Status: She is alert.     Comments: Patient has her left hand kept in a fist position however she can squeeze my finger if I place it in her fingers.  She can lift her left arm up off the stretcher about 60 degrees.  She could not lift her left leg up off the stretcher but not as high as her right leg.  She has no weakness in her right upper extremity.  Psychiatric:        Mood and Affect: Mood normal.        Behavior: Behavior normal.         Thought Content: Thought content normal.       ED Results / Procedures / Treatments   Labs (all labs ordered are listed, but only abnormal results are displayed) Labs Reviewed - No data to display  EKG EKG Interpretation  Date/Time:  Saturday August 27 2020 23:37:34 EST Ventricular Rate:  65 PR Interval:    QRS Duration: 138 QT Interval:  460 QTC Calculation: 479 R Axis:   -57 Text Interpretation: Sinus rhythm RBBB and LAFB Probable left ventricular hypertrophy No significant change since last tracing 21 Feb 2019 Confirmed by Devoria Albe ([id number]) on 08/28/2020 12:23:39 AM   Radiology No results found.  Procedures Procedures (including critical care time)  Medications Ordered in ED Medications - No data to display  ED Course  I have reviewed the triage vital signs and the nursing notes.  Pertinent labs & imaging results that were available during my care of the patient were reviewed by me and considered in my medical decision making (see chart for details).    MDM Rules/Calculators/A&P                          Review of patient's chart shows she has a history of cataract.  Patient denies having cataract surgery but her pupil would be consistent with having had cataract surgery.  Also reviewing her physical exams I found a ED chart from February 21, 2019 where they describe an irregular left pupil.  At this point no further evaluation was felt indicated in the ED.    Final Clinical Impression(s) / ED Diagnoses Final diagnoses:  Anisocoria    Rx / DC Orders ED Discharge Orders    None  Plan discharge  Devoria Albe, MD, Concha Pyo, MD 08/28/20 [phone]

## 2020-08-28 NOTE — ED Notes (Signed)
Visual Acuity. Right eye 20/30 & left eye is 20/200. MD aware.

## 2020-09-09 ENCOUNTER — Emergency Department (HOSPITAL_COMMUNITY): Payer: Medicare Other

## 2020-09-09 ENCOUNTER — Encounter (HOSPITAL_COMMUNITY): Payer: Self-pay | Admitting: Emergency Medicine

## 2020-09-09 ENCOUNTER — Inpatient Hospital Stay (HOSPITAL_COMMUNITY)
Admission: EM | Admit: 2020-09-09 | Discharge: 2020-09-12 | DRG: 481 | Disposition: A | Discharge status: Skilled Nursing Facility | Payer: Medicare Other | Attending: Internal Medicine | Admitting: Internal Medicine

## 2020-09-09 ENCOUNTER — Other Ambulatory Visit: Payer: Self-pay

## 2020-09-09 DIAGNOSIS — Z7902 Long term (current) use of antithrombotics/antiplatelets: Secondary | ICD-10-CM | POA: Diagnosis not present

## 2020-09-09 DIAGNOSIS — Z794 Long term (current) use of insulin: Secondary | ICD-10-CM

## 2020-09-09 DIAGNOSIS — Z90710 Acquired absence of both cervix and uterus: Secondary | ICD-10-CM

## 2020-09-09 DIAGNOSIS — E119 Type 2 diabetes mellitus without complications: Secondary | ICD-10-CM

## 2020-09-09 DIAGNOSIS — Z79890 Hormone replacement therapy (postmenopausal): Secondary | ICD-10-CM

## 2020-09-09 DIAGNOSIS — Z7982 Long term (current) use of aspirin: Secondary | ICD-10-CM

## 2020-09-09 DIAGNOSIS — W050XXA Fall from non-moving wheelchair, initial encounter: Secondary | ICD-10-CM | POA: Diagnosis present

## 2020-09-09 DIAGNOSIS — I110 Hypertensive heart disease with heart failure: Secondary | ICD-10-CM | POA: Diagnosis present

## 2020-09-09 DIAGNOSIS — E785 Hyperlipidemia, unspecified: Secondary | ICD-10-CM | POA: Diagnosis not present

## 2020-09-09 DIAGNOSIS — Z9049 Acquired absence of other specified parts of digestive tract: Secondary | ICD-10-CM | POA: Diagnosis not present

## 2020-09-09 DIAGNOSIS — Z91041 Radiographic dye allergy status: Secondary | ICD-10-CM

## 2020-09-09 DIAGNOSIS — E11319 Type 2 diabetes mellitus with unspecified diabetic retinopathy without macular edema: Secondary | ICD-10-CM | POA: Diagnosis present

## 2020-09-09 DIAGNOSIS — I5032 Chronic diastolic (congestive) heart failure: Secondary | ICD-10-CM | POA: Diagnosis not present

## 2020-09-09 DIAGNOSIS — I959 Hypotension, unspecified: Secondary | ICD-10-CM | POA: Diagnosis not present

## 2020-09-09 DIAGNOSIS — S72001A Fracture of unspecified part of neck of right femur, initial encounter for closed fracture: Secondary | ICD-10-CM | POA: Diagnosis present

## 2020-09-09 DIAGNOSIS — S72009A Fracture of unspecified part of neck of unspecified femur, initial encounter for closed fracture: Secondary | ICD-10-CM

## 2020-09-09 DIAGNOSIS — M797 Fibromyalgia: Secondary | ICD-10-CM | POA: Diagnosis not present

## 2020-09-09 DIAGNOSIS — F0390 Unspecified dementia without behavioral disturbance: Secondary | ICD-10-CM | POA: Diagnosis present

## 2020-09-09 DIAGNOSIS — Z880 Allergy status to penicillin: Secondary | ICD-10-CM | POA: Diagnosis not present

## 2020-09-09 DIAGNOSIS — G8110 Spastic hemiplegia affecting unspecified side: Secondary | ICD-10-CM | POA: Diagnosis not present

## 2020-09-09 DIAGNOSIS — S72141A Displaced intertrochanteric fracture of right femur, initial encounter for closed fracture: Principal | ICD-10-CM | POA: Diagnosis present

## 2020-09-09 DIAGNOSIS — D62 Acute posthemorrhagic anemia: Secondary | ICD-10-CM | POA: Diagnosis not present

## 2020-09-09 DIAGNOSIS — Z79899 Other long term (current) drug therapy: Secondary | ICD-10-CM | POA: Diagnosis not present

## 2020-09-09 DIAGNOSIS — E0810 Diabetes mellitus due to underlying condition with ketoacidosis without coma: Secondary | ICD-10-CM

## 2020-09-09 DIAGNOSIS — Z8249 Family history of ischemic heart disease and other diseases of the circulatory system: Secondary | ICD-10-CM | POA: Diagnosis not present

## 2020-09-09 DIAGNOSIS — N179 Acute kidney failure, unspecified: Secondary | ICD-10-CM | POA: Diagnosis not present

## 2020-09-09 DIAGNOSIS — E669 Obesity, unspecified: Secondary | ICD-10-CM | POA: Diagnosis present

## 2020-09-09 DIAGNOSIS — Z6831 Body mass index (BMI) 31.0-31.9, adult: Secondary | ICD-10-CM | POA: Diagnosis not present

## 2020-09-09 DIAGNOSIS — I69354 Hemiplegia and hemiparesis following cerebral infarction affecting left non-dominant side: Secondary | ICD-10-CM | POA: Diagnosis not present

## 2020-09-09 DIAGNOSIS — F418 Other specified anxiety disorders: Secondary | ICD-10-CM | POA: Diagnosis present

## 2020-09-09 DIAGNOSIS — Z20822 Contact with and (suspected) exposure to covid-19: Secondary | ICD-10-CM | POA: Diagnosis present

## 2020-09-09 DIAGNOSIS — I639 Cerebral infarction, unspecified: Secondary | ICD-10-CM | POA: Diagnosis present

## 2020-09-09 LAB — CBC WITH DIFFERENTIAL/PLATELET
Abs Immature Granulocytes: 0.07 10*3/uL (ref 0.00–0.07)
Basophils Absolute: 0 10*3/uL (ref 0.0–0.1)
Basophils Relative: 0 %
Eosinophils Absolute: 0.1 10*3/uL (ref 0.0–0.5)
Eosinophils Relative: 1 %
HCT: 41.3 % (ref 36.0–46.0)
Hemoglobin: 13.2 g/dL (ref 12.0–15.0)
Immature Granulocytes: 1 %
Lymphocytes Relative: 13 %
Lymphs Abs: 1.6 10*3/uL (ref 0.7–4.0)
MCH: 30.6 pg (ref 26.0–34.0)
MCHC: 32 g/dL (ref 30.0–36.0)
MCV: 95.8 fL (ref 80.0–100.0)
Monocytes Absolute: 0.7 10*3/uL (ref 0.1–1.0)
Monocytes Relative: 6 %
Neutro Abs: 9.8 10*3/uL — ABNORMAL HIGH (ref 1.7–7.7)
Neutrophils Relative %: 79 %
Platelets: 170 10*3/uL (ref 150–400)
RBC: 4.31 MIL/uL (ref 3.87–5.11)
RDW: 12.3 % (ref 11.5–15.5)
WBC: 12.4 10*3/uL — ABNORMAL HIGH (ref 4.0–10.5)
nRBC: 0 % (ref 0.0–0.2)

## 2020-09-09 LAB — BASIC METABOLIC PANEL
Anion gap: 9 (ref 5–15)
BUN: 31 mg/dL — ABNORMAL HIGH (ref 8–23)
CO2: 24 mmol/L (ref 22–32)
Calcium: 8.8 mg/dL — ABNORMAL LOW (ref 8.9–10.3)
Chloride: 107 mmol/L (ref 98–111)
Creatinine, Ser: 1.1 mg/dL — ABNORMAL HIGH (ref 0.44–1.00)
GFR, Estimated: 54 mL/min — ABNORMAL LOW (ref >60)
Glucose, Bld: 161 mg/dL — ABNORMAL HIGH (ref 70–99)
Potassium: 3.8 mmol/L (ref 3.5–5.1)
Sodium: 140 mmol/L (ref 135–145)

## 2020-09-09 LAB — TYPE AND SCREEN
ABO/RH(D): O POS
Antibody Screen: NEGATIVE

## 2020-09-09 LAB — ABO/RH: ABO/RH(D): O POS

## 2020-09-09 LAB — GLUCOSE, CAPILLARY: Glucose-Capillary: 155 mg/dL — ABNORMAL HIGH (ref 70–99)

## 2020-09-09 LAB — PROTIME-INR
INR: 0.9 (ref 0.8–1.2)
Prothrombin Time: 12.1 seconds (ref 11.4–15.2)

## 2020-09-09 MED ORDER — FENTANYL CITRATE (PF) 100 MCG/2ML IJ SOLN
25.0000 ug | Freq: Once | INTRAMUSCULAR | Status: AC
Start: 2020-09-09 — End: 2020-09-09
  Administered 2020-09-09: 25 ug via INTRAVENOUS
  Filled 2020-09-09: qty 2

## 2020-09-09 MED ORDER — ONDANSETRON HCL 4 MG/2ML IJ SOLN
4.0000 mg | Freq: Once | INTRAMUSCULAR | Status: AC
  Administered 2020-09-09: 4 mg via INTRAVENOUS
  Filled 2020-09-09: qty 2

## 2020-09-09 MED ORDER — INSULIN GLARGINE 100 UNIT/ML ~~LOC~~ SOLN
14.0000 [IU] | Freq: Every day | SUBCUTANEOUS | Status: DC
  Administered 2020-09-09 – 2020-09-11 (×3): 14 [IU] via SUBCUTANEOUS
  Filled 2020-09-09 (×6): qty 0.14

## 2020-09-09 MED ORDER — MORPHINE SULFATE (PF) 2 MG/ML IV SOLN
0.5000 mg | INTRAVENOUS | Status: DC | PRN
  Administered 2020-09-09 – 2020-09-10 (×3): 0.5 mg via INTRAVENOUS
  Filled 2020-09-09 (×3): qty 1

## 2020-09-09 MED ORDER — INSULIN ASPART 100 UNIT/ML ~~LOC~~ SOLN
0.0000 [IU] | Freq: Three times a day (TID) | SUBCUTANEOUS | Status: DC
  Administered 2020-09-10: 18:00:00 3 [IU] via SUBCUTANEOUS
  Administered 2020-09-10: 10:00:00 2 [IU] via SUBCUTANEOUS
  Administered 2020-09-11: 17:00:00 1 [IU] via SUBCUTANEOUS

## 2020-09-09 MED ORDER — INSULIN ASPART 100 UNIT/ML ~~LOC~~ SOLN
0.0000 [IU] | Freq: Every day | SUBCUTANEOUS | Status: DC
  Administered 2020-09-10: 2 [IU] via SUBCUTANEOUS

## 2020-09-09 MED ORDER — HYDROCODONE-ACETAMINOPHEN 5-325 MG PO TABS: 1.0000 | ORAL_TABLET | Freq: Four times a day (QID) | ORAL | Status: DC | PRN

## 2020-09-09 MED ORDER — HEPARIN SODIUM (PORCINE) 5000 UNIT/ML IJ SOLN: 5000.0000 [IU] | Freq: Three times a day (TID) | INTRAMUSCULAR | Status: DC

## 2020-09-09 NOTE — H&P (Signed)
TRH H&P   Patient Demographics:    Dawn Ho, is a 70 y.o. female  MRN: [id number]   DOB - 11-08-1950  Admit Date - 09/09/2020  Outpatient Primary MD for the patient is Eden Emms, Hali Marry, MD  Referring MD/NP/PA: PA Triplett    Patient coming from: [facility]  Chief Complaint  Patient presents with  . Fall      HPI:    Dawn Ho  is a 70 y.o. female, with past medical history of CVA with residual left-sided spastic hemiparesis, dyslipidemia, hypertension, diabetes mellitus, patient resides at [facility], to ED for evaluation for right hip pain secondary to mechanical fall, she has dementia at baseline, cannot provide reliable history of review of system, history was obtained by ED staff who contacted the SNF, apparently patient fall was adhered, and patient found laying in the floor on her right side, patient admits to fall from a wheelchair, but cannot explain any further, she was sent to ED for evaluation where x-ray significant for right hip fracture, at baseline patient had left-sided weakness can [place] to prior CVA, she ambulates only with assistance to stand and transfer from bed to chair. - in ED displaced right femoral neck fracture, ED discussed with Dr. Ophelia Charter, who requested patient to be transferred to [facility] where she will need surgical repair, [facility] hospitalist consulted to admit.    Review of systems:    Patient with dementia, cannot provide appropriate review of system.  With Past History of the following :    Past Medical History:  Diagnosis Date  . Cataract, left eye   . CVA (cerebral infarction)   . Depression with anxiety   . Diabetes mellitus   . Dyslipidemia   . Fibromyalgia   . Hypertension   . Retinopathy   . Stroke (HCC)    L. hemiparesis  . TIA (transient ischemic attack)       Past Surgical History:  Procedure  Laterality Date  . ABDOMINAL HYSTERECTOMY     age 26, "removed one ovary- excessive bleeding"  . CESAREAN SECTION    . CHOLECYSTECTOMY    . LOOP RECORDER IMPLANT     Dr. Johney Frame placed 10/15/2014  . TEE WITHOUT CARDIOVERSION N/A 10/15/2014   Procedure: TRANSESOPHAGEAL ECHOCARDIOGRAM (TEE);  Surgeon: Pricilla Riffle, MD;  Location: [facility] ENDOSCOPY;  Service: Cardiovascular;  Laterality: N/A;      Social History:     Social History   Tobacco Use  . Smoking status: Never Smoker  . Smokeless tobacco: Never Used  Substance Use Topics  . Alcohol use: No       Family History :     Family History  Problem Relation Age of Onset  . Heart disease Father   . Hypertension Sister   . Colon cancer Neg Hx   . Colon polyps Neg Hx  Home Medications:   Prior to Admission medications   Medication Sig Start Date End Date Taking? Authorizing Provider  acetaminophen (TYLENOL) 500 MG tablet Take 1,000 mg by mouth 2 (two) times daily as needed for moderate pain.     [provider]  Amino Acids-Protein Hydrolys (FEEDING SUPPLEMENT, PRO-STAT SUGAR FREE 64,) LIQD Take 30 mLs by mouth daily.    [provider]  aspirin EC 81 MG tablet Take 81 mg by mouth daily.    [provider]  atorvastatin (LIPITOR) 20 MG tablet Take 1 tablet (20 mg total) by mouth daily at 6 PM. 10/18/14   Meredeth Ide, MD  clopidogrel (PLAVIX) 75 MG tablet Take 1 tablet (75 mg total) by mouth daily. 10/18/14   Meredeth Ide, MD  cyanocobalamin (,VITAMIN B-12,) 1000 MCG/ML injection Inject 1 mL (1,000 mcg total) into the muscle every 30 (thirty) days. 12/11/17   Dhungel, Theda Belfast, MD  esomeprazole (NEXIUM) 40 MG packet Take 40 mg by mouth daily before breakfast.    [provider]  feeding supplement, GLUCERNA SHAKE, (GLUCERNA SHAKE) LIQD Take 237 mLs by mouth 3 (three) times daily between meals. 11/14/17   Dhungel, Nishant, MD  furosemide (LASIX) 20 MG tablet Take 60 mg by mouth daily.      [provider]  gabapentin (NEURONTIN) 300 MG capsule Take 1 capsule (300 mg total) by mouth at bedtime. 10/18/14   Meredeth Ide, MD  HYDROcodone-acetaminophen (NORCO/VICODIN) 5-325 MG tablet Take 1 tablet by mouth every 4 (four) hours as needed. 02/21/19   Henderly, Britni A, PA-C  insulin glargine (LANTUS) 100 UNIT/ML injection Inject 0.15 mLs (15 Units total) into the skin at bedtime. Patient taking differently: Inject 22 Units into the skin at bedtime.  11/14/17   Dhungel, Nishant, MD  insulin regular (NOVOLIN R,HUMULIN R) 100 units/mL injection Inject 0.05 mLs (5 Units total) into the skin 3 (three) times daily before meals. 11/14/17   Dhungel, Nishant, MD  levothyroxine (SYNTHROID, LEVOTHROID) 112 MCG tablet Take 112 mcg by mouth daily.     [provider]  LORazepam (ATIVAN) 0.5 MG tablet Take one tablet by mouth twice daily for anxiety Patient taking differently: Take 0.5 mg by mouth every morning.  11/14/17   Dhungel, Nishant, MD  losartan (COZAAR) 100 MG tablet Take 100 mg by mouth daily.      [provider]  metoprolol tartrate (LOPRESSOR) 50 MG tablet Take 25-50 mg by mouth See admin instructions. Patient takes 50mg  in the morning and 25mg  in the evening     [provider]  Multiple Vitamins-Minerals (MULTIVITAMIN WITH MINERALS) tablet Take 1 tablet by mouth daily.    [provider]  nitroGLYCERIN (NITROSTAT) 0.4 MG SL tablet Place 0.4 mg under the tongue every 5 (five) minutes as needed for chest pain.    [provider]  oxyCODONE (OXY IR/ROXICODONE) 5 MG immediate release tablet Take 1 tablet (5 mg total) by mouth 2 (two) times daily. 11/14/17   Dhungel, Nishant, MD  potassium chloride SA (K-DUR,KLOR-CON) 20 MEQ tablet Take 20 mEq by mouth daily.      [provider]  sennosides-docusate sodium (SENOKOT-S) 8.6-50 MG tablet Take 1 tablet by mouth at bedtime.     [provider]  sertraline (ZOLOFT) 100 MG tablet Take  100 mg by mouth daily.     [provider]     Allergies:     Allergies  Allergen Reactions  . Ivp Dye [Iodinated Diagnostic Agents]  Per pt: swelling with first contrast xray in 1969. Second contrast xray in the 90s without symptoms:  Patient has been pre-medicated for CT scans in 2016. Needs to have pre-medications before any scans with IV contrast. / TSF 08/30/16  . Penicillins Swelling and Rash     Physical Exam:   Vitals  Blood pressure 129/73, pulse (!) 58, resp. rate 12, height 5' (1.524 m), weight 73.5 kg, SpO2 100 %.   1. General female, laying in bed, no apparent distress  2..  Impaired judgment and insight, pleasant, answering questions in a confused manner, awake alert oriented x1.   3. patient with dense left-sided spastic hemiparesis .  She has her baseline  4. Ears and Eyes appear Normal, Conjunctivae clear, PERRLA. Moist Oral Mucosa.  5. Supple Neck, No JVD, No cervical lymphadenopathy appriciated, No Carotid Bruits.  6. Symmetrical Chest wall movement, Good air movement bilaterally, CTAB.  7. RRR, No Gallops, Rubs or Murmurs, No Parasternal Heave.  8. Positive Bowel Sounds, Abdomen Soft, No tenderness, No organomegaly appriciated,No rebound -guarding or rigidity.  9.  No Cyanosis, Normal Skin Turgor, No Skin Rash or Bruise.  10.  Patient with spastic left-sided weakness, she is tenderness in the right hip area  11. No Palpable Lymph Nodes in Neck or Axillae    Data Review:    CBC Recent Labs  Lab 09/09/20 1603  WBC 12.4*  HGB 13.2  HCT 41.3  PLT 170  MCV 95.8  MCH 30.6  MCHC 32.0  RDW 12.3  LYMPHSABS 1.6  MONOABS 0.7  EOSABS 0.1  BASOSABS 0.0   ------------------------------------------------------------------------------------------------------------------  Chemistries  Recent Labs  Lab 09/09/20 1603  NA 140  K 3.8  CL 107  CO2 24  GLUCOSE 161*  BUN 31*  CREATININE 1.10*  CALCIUM 8.8*    ------------------------------------------------------------------------------------------------------------------ estimated creatinine clearance is 42.6 mL/min (A) (by C-G formula based on SCr of 1.1 mg/dL (H)). ------------------------------------------------------------------------------------------------------------------ No results for input(s): TSH, T4TOTAL, T3FREE, THYROIDAB in the last 72 hours.  Invalid input(s): FREET3  Coagulation profile Recent Labs  Lab 09/09/20 1603  INR 0.9   ------------------------------------------------------------------------------------------------------------------- No results for input(s): DDIMER in the last 72 hours. -------------------------------------------------------------------------------------------------------------------  Cardiac Enzymes No results for input(s): CKMB, TROPONINI, MYOGLOBIN in the last 168 hours.  Invalid input(s): CK ------------------------------------------------------------------------------------------------------------------ No results found for: BNP   ---------------------------------------------------------------------------------------------------------------  Urinalysis    Component Value Date/Time   COLORURINE AMBER (A) 11/05/2017 1900   APPEARANCEUR CLOUDY (A) 11/05/2017 1900   LABSPEC 1.020 11/05/2017 1900   PHURINE 6.0 11/05/2017 1900   GLUCOSEU NEGATIVE 11/05/2017 1900   HGBUR SMALL (A) 11/05/2017 1900   BILIRUBINUR NEGATIVE 11/05/2017 1900   KETONESUR NEGATIVE 11/05/2017 1900   PROTEINUR NEGATIVE 11/05/2017 1900   UROBILINOGEN 0.2 11/17/2014 1100   NITRITE NEGATIVE 11/05/2017 1900   LEUKOCYTESUR MODERATE (A) 11/05/2017 1900    ----------------------------------------------------------------------------------------------------------------   Imaging Results:    DG Chest Portable 1 View  Result Date: 09/09/2020 CLINICAL DATA:  Fall, hip fracture EXAM: PORTABLE CHEST 1 VIEW COMPARISON:   Portable exam 1555 hours compared to 11/06/2017 FINDINGS: Normal heart size, mediastinal contours, and pulmonary vascularity. Loop recorder projects over chest. Atherosclerotic calcification aorta. Lungs clear. No pleural effusion or pneumothorax. Diffuse osseous demineralization. IMPRESSION: No acute abnormalities. Aortic Atherosclerosis (ICD10-I70.0). Electronically Signed   By: Ulyses Southward M.D.   On: 09/09/2020 16:06   DG Hip Unilat With Pelvis 2-3 Views Right  Result Date: 09/09/2020 CLINICAL DATA:  Larey Seat, right hip pain EXAM: DG HIP (WITH  OR WITHOUT PELVIS) 2-3V RIGHT COMPARISON:  None. FINDINGS: Frontal view of the pelvis as well as frontal and frogleg lateral views of the right hip are obtained. Evaluation is limited by body habitus and technique. There is a minimally displaced basicervical right femoral neck fracture with near anatomic alignment. No dislocation. Joint spaces are well preserved. Sclerotic focus left iliac crest likely bone island. IMPRESSION: 1. Minimally displaced basicervical right femoral neck fracture. Near anatomic alignment. Electronically Signed   By: Sharlet Salina M.D.   On: 09/09/2020 14:56       Assessment & Plan:    Active Problems:   Fibromyalgia   Diabetes mellitus (HCC)   Depression with anxiety   Cerebral infarction (HCC)   Spastic hemiparesis (HCC)   Closed right hip fracture (HCC)    Right Hip Fracture -This is secondary to mechanical fall. -Admitted under hip fracture pathway, ED physician discussed with Dr. Ophelia Charter who requested transfer to with the long hospital for surgical repair, continue with as needed pain medications. -DVT prophylaxis ordered to start tomorrow evening, this can be delayed further if surgery does not happen by then -Hold Plavix in anticipation of surgery   Diabetes mellitus, insulin-dependent -Resume Lantus , but the lower dose(20 units>>14units)  as she will be n.p.o. after midnight, will add insulin sliding scale  Struve  CVA with residual left-sided spastic hemiparesis -Continue with statin, resume Plavix after surgery.  Hypertension -Continue with home medications  Chronic diastolic CHF -Appears to be euvolemic, will hold on giving Lasix.  DVT Prophylaxis Heparin   AM Labs Ordered, also please review Full Orders  Family Communication: Admission, patients condition and plan of care including tests being ordered have been discussed with the patient and son by phone who indicate understanding and agree with the plan and Code Status.  Code Status Full  Likely DC to  [facility] SND  Condition GUARDED    Consults called: ortho Dr Ophelia Charter by ED, requested  patient to be transferred to [facility] long  Admission status: inpatient     Time spent in minutes : 60 minutes   Huey Bienenstock M.D on 09/09/2020 at 5:55 PM   [facility] - Office  [phone]

## 2020-09-09 NOTE — ED Provider Notes (Signed)
[facility] EMERGENCY DEPARTMENT Provider Note   CSN: [id number] Arrival date & time: 09/09/20  1142     History Chief Complaint  Patient presents with  . Fall    Dawn Ho is a 70 y.o. female.  HPI      Dawn Ho is a 70 y.o. female who [facility] sent to the emergency department for evaluation of right hip pain secondary to a mechanical fall.  Patient complains of right hip pain associated with movement and admits to a fall from her wheelchair.  She has dementia at baseline and unable to provide additional history information.  I spoke with caregiver at the facility who states that she fell this morning around 10:45 AM from her reclining wheelchair.  The fall was heard and patient found lying in the floor on her right side.  Caregiver at the facility states that she has left-sided weakness at baseline secondary to a prior stroke and ambulates only with assistance to stand and transfer from bed to chair.  Fall was not witnessed.  No reported vomiting or complaint of headache.   Past Medical History:  Diagnosis Date  . Cataract, left eye   . CVA (cerebral infarction)   . Depression with anxiety   . Diabetes mellitus   . Dyslipidemia   . Fibromyalgia   . Hypertension   . Retinopathy   . Stroke (HCC)    L. hemiparesis  . TIA (transient ischemic attack)     Patient Active Problem List   Diagnosis Date Noted  . Hypomagnesemia 11/12/2017  . Aspiration into airway   . Acute respiratory distress   . Lung crackles   . Ileus (HCC)   . Abdominal distension   . Leukocytosis   . Diarrhea   . UTI (urinary tract infection) 10/31/2017  . Hypokalemia 10/31/2017  . 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 Spastic hemiparesis (HCC) 01/12/2015  . HLD (hyperlipidemia)   . History of recent stroke   . Accelerated hypertension   . Acute cystitis without hematuria   . Tachycardia   . Stroke (HCC) 11/17/2014  . Left-sided weakness 11/17/2014  . 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 CVA (cerebral vascular accident) (HCC) 10/14/2014  . CVA (cerebral infarction)   . TIA (transient ischemic attack) 10/13/2014  . Chest pain 08/07/2011  . Fibromyalgia 08/07/2011  . Costochondritis 08/07/2011  . Diabetes mellitus (HCC) 08/07/2011  . Depression with anxiety 08/07/2011  . Obesity 08/07/2011  . History of TIAs 08/07/2011  . Hyperlipidemia 08/07/2011    Past Surgical History:  Procedure Laterality Date  . ABDOMINAL HYSTERECTOMY     age 70, "removed one ovary- excessive bleeding"  . CESAREAN SECTION    . CHOLECYSTECTOMY    . LOOP RECORDER IMPLANT     Dr. Johney FrameAllred placed 10/15/2014  . TEE WITHOUT CARDIOVERSION N/A 10/15/2014   Procedure: TRANSESOPHAGEAL ECHOCARDIOGRAM (TEE);  Surgeon: Pricilla RifflePaula Ross V, MD;  [facility];  Service: Cardiovascular;  Laterality: N/A;     OB History   No obstetric history on file.     Family History  Problem Relation Age of Onset  . Heart disease Father   . Hypertension Sister   . Colon cancer Neg Hx   . Colon polyps Neg Hx     Social History   Tobacco Use  . Smoking status: Never Smoker  . Smokeless tobacco: Never Used  Substance Use Topics  . Alcohol use: No  .  Drug use: No    Home Medications Prior to Admission medications   Medication Sig Start Date End Date Taking? Authorizing Provider  acetaminophen (TYLENOL) 500 MG tablet Take 1,000 mg by mouth 2 (two) times daily as needed for moderate pain.     [provider]  Amino Acids-Protein Hydrolys (FEEDING SUPPLEMENT, PRO-STAT SUGAR FREE 64,) LIQD Take 30 mLs by mouth daily.    [provider]  aspirin EC 81 MG tablet Take 81 mg by mouth daily.    [provider]  atorvastatin (LIPITOR) 20 MG tablet Take 1 tablet (20 mg total) by mouth daily at 6 PM. 10/18/14   Meredeth Ide, MD  clopidogrel (PLAVIX) 75 MG tablet Take 1 tablet (75 mg total) by mouth daily. 10/18/14   Meredeth Ide, MD   cyanocobalamin (,VITAMIN B-12,) 1000 MCG/ML injection Inject 1 mL (1,000 mcg total) into the muscle every 30 (thirty) days. 12/11/17   Dhungel, Theda Belfast, MD  esomeprazole (NEXIUM) 40 MG packet Take 40 mg by mouth daily before breakfast.    [provider]  feeding supplement, GLUCERNA SHAKE, (GLUCERNA SHAKE) LIQD Take 237 mLs by mouth 3 (three) times daily between meals. 11/14/17   Dhungel, Nishant, MD  furosemide (LASIX) 20 MG tablet Take 60 mg by mouth daily.     [provider]  gabapentin (NEURONTIN) 300 MG capsule Take 1 capsule (300 mg total) by mouth at bedtime. 10/18/14   Meredeth Ide, MD  HYDROcodone-acetaminophen (NORCO/VICODIN) 5-325 MG tablet Take 1 tablet by mouth every 4 (four) hours as needed. 02/21/19   Henderly, Britni A, PA-C  insulin glargine (LANTUS) 100 UNIT/ML injection Inject 0.15 mLs (15 Units total) into the skin at bedtime. Patient taking differently: Inject 22 Units into the skin at bedtime.  11/14/17   Dhungel, Nishant, MD  insulin regular (NOVOLIN R,HUMULIN R) 100 units/mL injection Inject 0.05 mLs (5 Units total) into the skin 3 (three) times daily before meals. 11/14/17   Dhungel, Nishant, MD  levothyroxine (SYNTHROID, LEVOTHROID) 112 MCG tablet Take 112 mcg by mouth daily.     [provider]  LORazepam (ATIVAN) 0.5 MG tablet Take one tablet by mouth twice daily for anxiety Patient taking differently: Take 0.5 mg by mouth every morning.  11/14/17   Dhungel, Nishant, MD  losartan (COZAAR) 100 MG tablet Take 100 mg by mouth daily.      [provider]  metoprolol tartrate (LOPRESSOR) 50 MG tablet Take 25-50 mg by mouth See admin instructions. Patient takes 50mg  in the morning and 25mg  in the evening     [provider]  Multiple Vitamins-Minerals (MULTIVITAMIN WITH MINERALS) tablet Take 1 tablet by mouth daily.    [provider]  nitroGLYCERIN (NITROSTAT) 0.4 MG SL tablet Place 0.4 mg under the tongue every 5 (five) minutes  as needed for chest pain.    [provider]  oxyCODONE (OXY IR/ROXICODONE) 5 MG immediate release tablet Take 1 tablet (5 mg total) by mouth 2 (two) times daily. 11/14/17   Dhungel, Nishant, MD  potassium chloride SA (K-DUR,KLOR-CON) 20 MEQ tablet Take 20 mEq by mouth daily.      [provider]  sennosides-docusate sodium (SENOKOT-S) 8.6-50 MG tablet Take 1 tablet by mouth at bedtime.     [provider]  sertraline (ZOLOFT) 100 MG tablet Take 100 mg by mouth daily.     [provider]    Allergies    Ivp dye [iodinated diagnostic agents] and Penicillins  Review  of Systems   Review of Systems  Unable to perform ROS: Dementia  Musculoskeletal: Positive for arthralgias (right hip pain). Negative for joint swelling and neck pain.  Skin: Negative for color change and wound.    Physical Exam Updated Vital Signs BP 129/73   Pulse (!) 58   Resp 12   Ht 5' (1.524 m)   Wt 73.5 kg   SpO2 100%   BMI 31.64 kg/m   Physical Exam Vitals and nursing note reviewed.  Constitutional:      Appearance: Normal appearance. She is not ill-appearing or toxic-appearing.  HENT:     Head: Atraumatic.  Eyes:     Pupils: Pupils are equal, round, and reactive to light.  Cardiovascular:     Rate and Rhythm: Normal rate and regular rhythm.     Pulses: Normal pulses.  Pulmonary:     Effort: Pulmonary effort is normal.     Breath sounds: Normal breath sounds.  Abdominal:     Palpations: Abdomen is soft.     Tenderness: There is no abdominal tenderness.  Musculoskeletal:        General: Signs of injury present. No tenderness.     Cervical back: Normal range of motion.     Comments: Pain with range of motion of the right hip.  Patient holding right leg in flexed position.  Right knee non tender  Skin:    General: Skin is warm.     Capillary Refill: Capillary refill takes less than 2 seconds.     ED Results / Procedures / Treatments   Labs (all labs ordered are  listed, but only abnormal results are displayed) Labs Reviewed  BASIC METABOLIC PANEL - Abnormal; Notable for the following components:      Result Value   Glucose, Bld 161 (*)    BUN 31 (*)    Creatinine, Ser 1.10 (*)    Calcium 8.8 (*)    GFR, Estimated 54 (*)    All other components within normal limits  CBC WITH DIFFERENTIAL/PLATELET - Abnormal; Notable for the following components:   WBC 12.4 (*)    Neutro Abs 9.8 (*)    All other components within normal limits  SARS CORONAVIRUS 2 (TAT 6-24 HRS)  PROTIME-INR  HEMOGLOBIN A1C  TYPE AND SCREEN  ABO/RH    EKG EKG Interpretation  Date/Time:  Friday September 09 2020 16:38:41 EST Ventricular Rate:  64 PR Interval:    QRS Duration: 140 QT Interval:  477 QTC Calculation: 493 R Axis:   -62 Text Interpretation: Sinus rhythm RBBB and LAFB No significant change since last tracing Confirmed by Susy Frizzle [phone]) on 09/09/2020 5:43:28 PM   Radiology DG Chest Portable 1 View  Result Date: 09/09/2020 CLINICAL DATA:  Fall, hip fracture EXAM: PORTABLE CHEST 1 VIEW COMPARISON:  Portable exam 1555 hours compared to 11/06/2017 FINDINGS: Normal heart size, mediastinal contours, and pulmonary vascularity. Loop recorder projects over chest. Atherosclerotic calcification aorta. Lungs clear. No pleural effusion or pneumothorax. Diffuse osseous demineralization. IMPRESSION: No acute abnormalities. Aortic Atherosclerosis (ICD10-I70.0). Electronically Signed   By: Ulyses Southward M.D.   On: 09/09/2020 16:06   DG Hip Unilat With Pelvis 2-3 Views Right  Result Date: 09/09/2020 CLINICAL DATA:  Larey Seat, right hip pain EXAM: DG HIP (WITH OR WITHOUT PELVIS) 2-3V RIGHT COMPARISON:  None. FINDINGS: Frontal view of the pelvis as well as frontal and frogleg lateral views of the right hip are obtained. Evaluation is limited by body habitus and technique. There is a  minimally displaced basicervical right femoral neck fracture with near anatomic alignment. No  dislocation. Joint spaces are well preserved. Sclerotic focus left iliac crest likely bone island. IMPRESSION: 1. Minimally displaced basicervical right femoral neck fracture. Near anatomic alignment. Electronically Signed   By: Sharlet Salina M.D.   On: 09/09/2020 14:56    Procedures Procedures (including critical care time)  Medications Ordered in ED Medications - No data to display  ED Course  I have reviewed the triage vital signs and the nursing notes.  Pertinent labs & imaging results that were available during my care of the patient were reviewed by me and considered in my medical decision making (see chart for details).    MDM Rules/Calculators/A&P                          Patient here from SNF facility for evaluation of right hip pain secondary to a fall from her wheelchair that occurred earlier today.  Patient has pain with movement of the right hip and he is holding the right leg in a flexed position.  Neurovascularly intact.  Per caregiver at SNF, patient has chronic left-sided weakness secondary to a stroke and ambulates with assistance only to stand and transfer from bed to wheelchair at baseline.  Consulted orthopedics, Dr. Aundria Rud who recommended consultation with Cyndia Skeeters since she is a recent patient of the practice.    Consulted Dr. Ophelia Charter who recommends hospitalist admit and transfer to [facility] for surgical care.    Consulted with [facility] hospitalists, Dr. Randol Kern who agrees to admit and arrange transfer to [facility].    Final Clinical Impression(s) / ED Diagnoses Final diagnoses:  Closed right hip fracture, initial encounter [facility])    Rx / DC Orders ED Discharge Orders    None       Pauline Aus, PA-C 09/09/20 1845    Rozelle Logan, DO 09/13/20 1343

## 2020-09-09 NOTE — ED Triage Notes (Signed)
Pt fell when getting out of her wheelchair. Pain to her right hip.

## 2020-09-10 ENCOUNTER — Inpatient Hospital Stay (HOSPITAL_COMMUNITY): Payer: Medicare Other

## 2020-09-10 ENCOUNTER — Inpatient Hospital Stay (HOSPITAL_COMMUNITY): Payer: Medicare Other | Admitting: Anesthesiology

## 2020-09-10 ENCOUNTER — Encounter (HOSPITAL_COMMUNITY)
Admission: EM | Disposition: A | Discharge status: Skilled Nursing Facility | Payer: Self-pay | Source: Home / Self Care | Attending: Internal Medicine

## 2020-09-10 DIAGNOSIS — S72001A Fracture of unspecified part of neck of right femur, initial encounter for closed fracture: Secondary | ICD-10-CM

## 2020-09-10 HISTORY — PX: ORIF HIP FRACTURE: SHX2125

## 2020-09-10 LAB — CREATININE, SERUM
Creatinine, Ser: 1.02 mg/dL — ABNORMAL HIGH (ref 0.44–1.00)
GFR, Estimated: 59 mL/min — ABNORMAL LOW (ref >60)

## 2020-09-10 LAB — CBC
HCT: 39.2 % (ref 36.0–46.0)
HCT: 38 % (ref 36.0–46.0)
Hemoglobin: 12.6 g/dL (ref 12.0–15.0)
Hemoglobin: 12 g/dL (ref 12.0–15.0)
MCH: 30.7 pg (ref 26.0–34.0)
MCH: 30.8 pg (ref 26.0–34.0)
MCHC: 32.1 g/dL (ref 30.0–36.0)
MCHC: 31.6 g/dL (ref 30.0–36.0)
MCV: 95.4 fL (ref 80.0–100.0)
MCV: 97.4 fL (ref 80.0–100.0)
Platelets: 157 10*3/uL (ref 150–400)
Platelets: 138 10*3/uL — ABNORMAL LOW (ref 150–400)
RBC: 4.11 MIL/uL (ref 3.87–5.11)
RBC: 3.9 MIL/uL (ref 3.87–5.11)
RDW: 12.6 % (ref 11.5–15.5)
RDW: 12.6 % (ref 11.5–15.5)
WBC: 11.2 10*3/uL — ABNORMAL HIGH (ref 4.0–10.5)
WBC: 13.6 10*3/uL — ABNORMAL HIGH (ref 4.0–10.5)
nRBC: 0 % (ref 0.0–0.2)
nRBC: 0 % (ref 0.0–0.2)

## 2020-09-10 LAB — SARS CORONAVIRUS 2 (TAT 6-24 HRS)
SARS Coronavirus 2: NEGATIVE
SARS Coronavirus 2: NEGATIVE

## 2020-09-10 LAB — TYPE AND SCREEN
ABO/RH(D): O POS
Antibody Screen: NEGATIVE

## 2020-09-10 LAB — BASIC METABOLIC PANEL
Anion gap: 11 (ref 5–15)
BUN: 25 mg/dL — ABNORMAL HIGH (ref 8–23)
CO2: 22 mmol/L (ref 22–32)
Calcium: 9 mg/dL (ref 8.9–10.3)
Chloride: 108 mmol/L (ref 98–111)
Creatinine, Ser: 1.03 mg/dL — ABNORMAL HIGH (ref 0.44–1.00)
GFR, Estimated: 58 mL/min — ABNORMAL LOW (ref >60)
Glucose, Bld: 143 mg/dL — ABNORMAL HIGH (ref 70–99)
Potassium: 4 mmol/L (ref 3.5–5.1)
Sodium: 141 mmol/L (ref 135–145)

## 2020-09-10 LAB — HIV ANTIBODY (ROUTINE TESTING W REFLEX): HIV Screen 4th Generation wRfx: NONREACTIVE

## 2020-09-10 LAB — GLUCOSE, CAPILLARY
Glucose-Capillary: 135 mg/dL — ABNORMAL HIGH (ref 70–99)
Glucose-Capillary: 245 mg/dL — ABNORMAL HIGH (ref 70–99)
Glucose-Capillary: 208 mg/dL — ABNORMAL HIGH (ref 70–99)

## 2020-09-10 LAB — MRSA PCR SCREENING: MRSA by PCR: NEGATIVE

## 2020-09-10 LAB — HEMOGLOBIN A1C
Hgb A1c MFr Bld: 5.7 % — ABNORMAL HIGH (ref 4.8–5.6)
Mean Plasma Glucose: 116.89 mg/dL

## 2020-09-10 SURGERY — OPEN REDUCTION INTERNAL FIXATION HIP
Anesthesia: General | Site: Hip | Laterality: Right

## 2020-09-10 MED ORDER — PRO-STAT SUGAR FREE PO LIQD
30.0000 mL | Freq: Every day | ORAL | Status: DC
  Filled 2020-09-10: qty 30

## 2020-09-10 MED ORDER — PHENYLEPHRINE 40 MCG/ML (10ML) SYRINGE FOR IV PUSH (FOR BLOOD PRESSURE SUPPORT)
PREFILLED_SYRINGE | INTRAVENOUS | Status: AC
  Filled 2020-09-10: qty 10

## 2020-09-10 MED ORDER — ONDANSETRON HCL 4 MG/2ML IJ SOLN
INTRAMUSCULAR | Status: DC | PRN
  Administered 2020-09-10: 4 mg via INTRAVENOUS

## 2020-09-10 MED ORDER — ATORVASTATIN CALCIUM 20 MG PO TABS
20.0000 mg | ORAL_TABLET | Freq: Every day | ORAL | Status: DC
  Administered 2020-09-10 – 2020-09-11 (×2): 20 mg via ORAL
  Filled 2020-09-10 (×2): qty 1

## 2020-09-10 MED ORDER — CLOTRIMAZOLE 1 % EX CREA: TOPICAL_CREAM | Freq: Two times a day (BID) | CUTANEOUS | Status: DC

## 2020-09-10 MED ORDER — 0.9 % SODIUM CHLORIDE (POUR BTL) OPTIME
TOPICAL | Status: DC | PRN
  Administered 2020-09-10: 1000 mL

## 2020-09-10 MED ORDER — EPHEDRINE SULFATE-NACL 50-0.9 MG/10ML-% IV SOSY
PREFILLED_SYRINGE | INTRAVENOUS | Status: DC | PRN
  Administered 2020-09-10: 10 mg via INTRAVENOUS

## 2020-09-10 MED ORDER — CYANOCOBALAMIN 1000 MCG/ML IJ SOLN: 1000.0000 ug | INTRAMUSCULAR | Status: DC

## 2020-09-10 MED ORDER — TRAMADOL HCL 50 MG PO TABS
50.0000 mg | ORAL_TABLET | Freq: Four times a day (QID) | ORAL | Status: DC
  Administered 2020-09-10 – 2020-09-11 (×2): 50 mg via ORAL
  Filled 2020-09-10 (×2): qty 1

## 2020-09-10 MED ORDER — HYDROMORPHONE HCL 1 MG/ML IJ SOLN
0.5000 mg | INTRAMUSCULAR | Status: DC | PRN
  Administered 2020-09-10: 1 mg via INTRAVENOUS
  Filled 2020-09-10: qty 1

## 2020-09-10 MED ORDER — PROPOFOL 10 MG/ML IV BOLUS
INTRAVENOUS | Status: AC
  Filled 2020-09-10: qty 20

## 2020-09-10 MED ORDER — BUPIVACAINE HCL 0.25 % IJ SOLN
INTRAMUSCULAR | Status: AC
  Filled 2020-09-10: qty 1

## 2020-09-10 MED ORDER — FENTANYL CITRATE (PF) 100 MCG/2ML IJ SOLN
INTRAMUSCULAR | Status: AC
  Filled 2020-09-10: qty 2

## 2020-09-10 MED ORDER — PHENYLEPHRINE HCL-NACL 10-0.9 MG/250ML-% IV SOLN
INTRAVENOUS | Status: DC | PRN
  Administered 2020-09-10: 40 ug/min via INTRAVENOUS

## 2020-09-10 MED ORDER — PHENYLEPHRINE 40 MCG/ML (10ML) SYRINGE FOR IV PUSH (FOR BLOOD PRESSURE SUPPORT)
PREFILLED_SYRINGE | INTRAVENOUS | Status: DC | PRN
  Administered 2020-09-10 (×4): 120 ug via INTRAVENOUS

## 2020-09-10 MED ORDER — PHENOL 1.4 % MT LIQD: 1.0000 | OROMUCOSAL | Status: DC | PRN

## 2020-09-10 MED ORDER — DEXAMETHASONE SODIUM PHOSPHATE 10 MG/ML IJ SOLN
INTRAMUSCULAR | Status: AC
  Filled 2020-09-10: qty 1

## 2020-09-10 MED ORDER — ESCITALOPRAM OXALATE 20 MG PO TABS
20.0000 mg | ORAL_TABLET | Freq: Every day | ORAL | Status: DC
  Administered 2020-09-10 – 2020-09-12 (×3): 20 mg via ORAL
  Filled 2020-09-10 (×3): qty 1

## 2020-09-10 MED ORDER — ENOXAPARIN SODIUM 40 MG/0.4ML ~~LOC~~ SOLN
40.0000 mg | SUBCUTANEOUS | Status: DC
  Administered 2020-09-11 – 2020-09-12 (×2): 40 mg via SUBCUTANEOUS
  Filled 2020-09-10 (×2): qty 0.4

## 2020-09-10 MED ORDER — ONDANSETRON HCL 4 MG PO TABS: 4.0000 mg | ORAL_TABLET | Freq: Four times a day (QID) | ORAL | Status: DC | PRN

## 2020-09-10 MED ORDER — GLUCERNA SHAKE PO LIQD: 237.0000 mL | Freq: Three times a day (TID) | ORAL | Status: DC

## 2020-09-10 MED ORDER — PHENYLEPHRINE 40 MCG/ML (10ML) SYRINGE FOR IV PUSH (FOR BLOOD PRESSURE SUPPORT)
PREFILLED_SYRINGE | INTRAVENOUS | Status: AC
  Filled 2020-09-10: qty 20

## 2020-09-10 MED ORDER — FENTANYL CITRATE (PF) 100 MCG/2ML IJ SOLN
INTRAMUSCULAR | Status: DC | PRN
  Administered 2020-09-10 (×2): 25 ug via INTRAVENOUS
  Administered 2020-09-10: 50 ug via INTRAVENOUS

## 2020-09-10 MED ORDER — POVIDONE-IODINE 10 % EX SWAB: 2.0000 "application " | Freq: Once | CUTANEOUS | Status: DC

## 2020-09-10 MED ORDER — ROCURONIUM BROMIDE 10 MG/ML (PF) SYRINGE
PREFILLED_SYRINGE | INTRAVENOUS | Status: AC
  Filled 2020-09-10: qty 10

## 2020-09-10 MED ORDER — ACETAMINOPHEN 325 MG PO TABS: 325.0000 mg | ORAL_TABLET | Freq: Four times a day (QID) | ORAL | Status: DC | PRN

## 2020-09-10 MED ORDER — SENNOSIDES-DOCUSATE SODIUM 8.6-50 MG PO TABS
1.0000 | ORAL_TABLET | Freq: Every day | ORAL | Status: DC
  Administered 2020-09-10: 1 via ORAL
  Filled 2020-09-10: qty 1

## 2020-09-10 MED ORDER — LACTATED RINGERS IV SOLN: INTRAVENOUS | Status: DC | PRN

## 2020-09-10 MED ORDER — ADULT MULTIVITAMIN W/MINERALS CH
1.0000 | ORAL_TABLET | Freq: Every day | ORAL | Status: DC
  Administered 2020-09-10 – 2020-09-12 (×3): 1 via ORAL
  Filled 2020-09-10 (×3): qty 1

## 2020-09-10 MED ORDER — BUPIVACAINE HCL (PF) 0.25 % IJ SOLN
INTRAMUSCULAR | Status: DC | PRN
  Administered 2020-09-10: 20 mL

## 2020-09-10 MED ORDER — FUROSEMIDE 40 MG PO TABS
60.0000 mg | ORAL_TABLET | Freq: Every day | ORAL | Status: DC
  Administered 2020-09-10: 18:00:00 60 mg via ORAL
  Filled 2020-09-10: qty 1

## 2020-09-10 MED ORDER — SERTRALINE HCL 50 MG PO TABS: 100.0000 mg | ORAL_TABLET | Freq: Every day | ORAL | Status: DC

## 2020-09-10 MED ORDER — CLINDAMYCIN PHOSPHATE 900 MG/50ML IV SOLN
900.0000 mg | INTRAVENOUS | Status: AC
  Administered 2020-09-10: 900 mg via INTRAVENOUS

## 2020-09-10 MED ORDER — NITROGLYCERIN 0.4 MG SL SUBL: 0.4000 mg | SUBLINGUAL_TABLET | SUBLINGUAL | Status: DC | PRN

## 2020-09-10 MED ORDER — NYSTATIN 100000 UNIT/GM EX POWD
1.0000 "application " | Freq: Two times a day (BID) | CUTANEOUS | Status: DC
  Administered 2020-09-10 – 2020-09-12 (×4): 1 via TOPICAL
  Filled 2020-09-10: qty 15

## 2020-09-10 MED ORDER — FENTANYL CITRATE (PF) 100 MCG/2ML IJ SOLN
25.0000 ug | INTRAMUSCULAR | Status: DC | PRN
  Administered 2020-09-10 (×3): 50 ug via INTRAVENOUS

## 2020-09-10 MED ORDER — CIPROFLOXACIN HCL 0.2 % OT SOLN: 0.4000 mL | Freq: Two times a day (BID) | OTIC | Status: DC

## 2020-09-10 MED ORDER — GABAPENTIN 300 MG PO CAPS
300.0000 mg | ORAL_CAPSULE | Freq: Every day | ORAL | Status: DC
  Administered 2020-09-10: 300 mg via ORAL
  Filled 2020-09-10: qty 1

## 2020-09-10 MED ORDER — SUGAMMADEX SODIUM 200 MG/2ML IV SOLN
INTRAVENOUS | Status: DC | PRN
  Administered 2020-09-10: 150 mg via INTRAVENOUS

## 2020-09-10 MED ORDER — LIDOCAINE 5 % EX PTCH
1.0000 | MEDICATED_PATCH | Freq: Every day | CUTANEOUS | Status: DC
  Administered 2020-09-10 – 2020-09-12 (×3): 1 via TRANSDERMAL
  Filled 2020-09-10 (×3): qty 1

## 2020-09-10 MED ORDER — OXYCODONE HCL 5 MG PO TABS: 10.0000 mg | ORAL_TABLET | ORAL | Status: DC | PRN

## 2020-09-10 MED ORDER — ESOMEPRAZOLE MAGNESIUM 40 MG PO PACK: 40.0000 mg | PACK | Freq: Every day | ORAL | Status: DC

## 2020-09-10 MED ORDER — ONDANSETRON HCL 4 MG/2ML IJ SOLN
INTRAMUSCULAR | Status: AC
  Filled 2020-09-10: qty 2

## 2020-09-10 MED ORDER — DOCUSATE SODIUM 100 MG PO CAPS
100.0000 mg | ORAL_CAPSULE | Freq: Two times a day (BID) | ORAL | Status: DC
  Administered 2020-09-10: 22:00:00 100 mg via ORAL
  Filled 2020-09-10: qty 1

## 2020-09-10 MED ORDER — OXYCODONE HCL 5 MG PO TABS
5.0000 mg | ORAL_TABLET | ORAL | Status: DC | PRN
  Administered 2020-09-11: 5 mg via ORAL
  Administered 2020-09-11: 10 mg via ORAL
  Administered 2020-09-12: 5 mg via ORAL
  Administered 2020-09-12: 10 mg via ORAL
  Filled 2020-09-10: qty 2
  Filled 2020-09-10: qty 1
  Filled 2020-09-10 (×2): qty 2

## 2020-09-10 MED ORDER — METOPROLOL TARTRATE 50 MG PO TABS
50.0000 mg | ORAL_TABLET | Freq: Every day | ORAL | Status: DC
Start: 2020-09-11 — End: 2020-09-11
  Administered 2020-09-10: 50 mg via ORAL
  Filled 2020-09-10: qty 1

## 2020-09-10 MED ORDER — CIPROFLOXACIN HCL 0.3 % OP SOLN
1.0000 [drp] | Freq: Two times a day (BID) | OPHTHALMIC | Status: DC
  Administered 2020-09-10 – 2020-09-12 (×4): 1 [drp] via OTIC
  Filled 2020-09-10: qty 2.5

## 2020-09-10 MED ORDER — ROCURONIUM BROMIDE 10 MG/ML (PF) SYRINGE
PREFILLED_SYRINGE | INTRAVENOUS | Status: DC | PRN
  Administered 2020-09-10: 40 mg via INTRAVENOUS

## 2020-09-10 MED ORDER — PHENYLEPHRINE HCL (PRESSORS) 10 MG/ML IV SOLN
INTRAVENOUS | Status: AC
  Filled 2020-09-10: qty 1

## 2020-09-10 MED ORDER — LEVOTHYROXINE SODIUM 112 MCG PO TABS
112.0000 ug | ORAL_TABLET | Freq: Every day | ORAL | Status: DC
  Administered 2020-09-11 – 2020-09-12 (×2): 112 ug via ORAL
  Filled 2020-09-10 (×2): qty 1

## 2020-09-10 MED ORDER — PROPOFOL 10 MG/ML IV BOLUS
INTRAVENOUS | Status: DC | PRN
  Administered 2020-09-10: 50 mg via INTRAVENOUS

## 2020-09-10 MED ORDER — CHLORHEXIDINE GLUCONATE 4 % EX LIQD
60.0000 mL | Freq: Once | CUTANEOUS | Status: AC
  Administered 2020-09-10: 4 via TOPICAL

## 2020-09-10 MED ORDER — CLINDAMYCIN PHOSPHATE 900 MG/50ML IV SOLN
INTRAVENOUS | Status: AC
  Filled 2020-09-10: qty 50

## 2020-09-10 MED ORDER — ONDANSETRON HCL 4 MG/2ML IJ SOLN: 4.0000 mg | Freq: Four times a day (QID) | INTRAMUSCULAR | Status: DC | PRN

## 2020-09-10 MED ORDER — MENTHOL 3 MG MT LOZG: 1.0000 | LOZENGE | OROMUCOSAL | Status: DC | PRN

## 2020-09-10 MED ORDER — GABAPENTIN 300 MG PO CAPS
600.0000 mg | ORAL_CAPSULE | Freq: Every day | ORAL | Status: DC
  Administered 2020-09-10: 600 mg via ORAL
  Filled 2020-09-10: qty 2

## 2020-09-10 MED ORDER — LIDOCAINE 2% (20 MG/ML) 5 ML SYRINGE
INTRAMUSCULAR | Status: DC | PRN
  Administered 2020-09-10: 60 mg via INTRAVENOUS

## 2020-09-10 MED ORDER — LORAZEPAM 0.5 MG PO TABS: 0.5000 mg | ORAL_TABLET | Freq: Every morning | ORAL | Status: DC

## 2020-09-10 MED ORDER — POTASSIUM CHLORIDE CRYS ER 20 MEQ PO TBCR: 20.0000 meq | EXTENDED_RELEASE_TABLET | Freq: Every day | ORAL | Status: DC

## 2020-09-10 MED ORDER — DEXAMETHASONE SODIUM PHOSPHATE 10 MG/ML IJ SOLN
INTRAMUSCULAR | Status: DC | PRN
  Administered 2020-09-10: 8 mg via INTRAVENOUS

## 2020-09-10 MED ORDER — ENSURE ENLIVE PO LIQD
237.0000 mL | Freq: Two times a day (BID) | ORAL | Status: DC
  Administered 2020-09-11 – 2020-09-12 (×3): 237 mL via ORAL

## 2020-09-10 MED ORDER — LOSARTAN POTASSIUM 50 MG PO TABS
100.0000 mg | ORAL_TABLET | Freq: Every day | ORAL | Status: DC
  Administered 2020-09-10: 100 mg via ORAL
  Filled 2020-09-10: qty 2

## 2020-09-10 MED ORDER — POTASSIUM CHLORIDE ER 10 MEQ PO TBCR
10.0000 meq | EXTENDED_RELEASE_TABLET | Freq: Every day | ORAL | Status: DC
  Administered 2020-09-10: 10 meq via ORAL
  Filled 2020-09-10 (×3): qty 1

## 2020-09-10 MED ORDER — FAMOTIDINE 20 MG PO TABS: 20.0000 mg | ORAL_TABLET | Freq: Every day | ORAL | Status: DC

## 2020-09-10 SURGICAL SUPPLY — 43 items
BAG SPEC THK2 15X12 ZIP CLS (MISCELLANEOUS) ×1
BAG ZIPLOCK 12X15 (MISCELLANEOUS) ×2 IMPLANT
BIT DRILL CANN LG 4.3MM (BIT) IMPLANT
BNDG COHESIVE 6X5 TAN STRL LF (GAUZE/BANDAGES/DRESSINGS) ×2 IMPLANT
COVER SURGICAL LIGHT HANDLE (MISCELLANEOUS) ×2 IMPLANT
COVER WAND RF STERILE (DRAPES) IMPLANT
DECANTER SPIKE VIAL GLASS SM (MISCELLANEOUS) ×2 IMPLANT
DRAPE STERI IOBAN 125X83 (DRAPES) ×2 IMPLANT
DRILL BIT CANN LG 4.3MM (BIT) ×2
DRSG AQUACEL AG ADV 3.5X 4 (GAUZE/BANDAGES/DRESSINGS) ×4 IMPLANT
DURAPREP 26ML APPLICATOR (WOUND CARE) ×2 IMPLANT
ELECT REM PT RETURN 15FT ADLT (MISCELLANEOUS) ×2 IMPLANT
FACESHIELD WRAPAROUND (MASK) ×4 IMPLANT
FACESHIELD WRAPAROUND OR TEAM (MASK) ×2 IMPLANT
GAUZE SPONGE 4X4 12PLY STRL (GAUZE/BANDAGES/DRESSINGS) ×1 IMPLANT
GLOVE ORTHO TXT STRL SZ7.5 (GLOVE) ×2 IMPLANT
GLOVE SRG 8 PF TXTR STRL LF DI (GLOVE) ×1 IMPLANT
GLOVE SURG ENC MOIS LTX SZ7 (GLOVE) ×2 IMPLANT
GLOVE SURG UNDER POLY LF SZ7 (GLOVE) ×2 IMPLANT
GLOVE SURG UNDER POLY LF SZ8 (GLOVE) ×2
GOWN STRL REUS W/ TWL LRG LVL3 (GOWN DISPOSABLE) ×2 IMPLANT
GOWN STRL REUS W/TWL LRG LVL3 (GOWN DISPOSABLE) ×6 IMPLANT
GUIDEPIN 3.2X17.5 THRD DISP (PIN) ×2 IMPLANT
HIP FRA NAIL LAG SCREW 10.5X90 (Orthopedic Implant) ×2 IMPLANT
KIT BASIN OR (CUSTOM PROCEDURE TRAY) ×2 IMPLANT
KIT TURNOVER KIT A (KITS) IMPLANT
NAIL HIP FRACT 130D 11X180 (Screw) ×1 IMPLANT
NEEDLE HYPO 22GX1.5 SAFETY (NEEDLE) ×2 IMPLANT
NS IRRIG 1000ML POUR BTL (IV SOLUTION) ×2 IMPLANT
PACK GENERAL/GYN (CUSTOM PROCEDURE TRAY) ×2 IMPLANT
PENCIL SMOKE EVACUATOR (MISCELLANEOUS) IMPLANT
PROTECTOR NERVE ULNAR (MISCELLANEOUS) ×2 IMPLANT
SCREW BONE CORTICAL 5.0X3 (Screw) ×1 IMPLANT
SCREW LAG HIP FRA NAIL 10.5X90 (Orthopedic Implant) IMPLANT
STRIP CLOSURE SKIN 1/2X4 (GAUZE/BANDAGES/DRESSINGS) ×2 IMPLANT
SUT VIC AB 0 CT1 27 (SUTURE) ×2
SUT VIC AB 0 CT1 27XBRD ANTBC (SUTURE) ×1 IMPLANT
SUT VIC AB 3-0 SH 8-18 (SUTURE) ×2 IMPLANT
SYR 20ML LL LF (SYRINGE) ×2 IMPLANT
TOWEL OR 17X26 10 PK STRL BLUE (TOWEL DISPOSABLE) ×2 IMPLANT
TOWEL OR NON WOVEN STRL DISP B (DISPOSABLE) ×2 IMPLANT
TRAY FOLEY MTR SLVR 16FR STAT (SET/KITS/TRAYS/PACK) IMPLANT
WATER STERILE IRR 1000ML POUR (IV SOLUTION) ×2 IMPLANT

## 2020-09-10 NOTE — Progress Notes (Signed)
Initial Nutrition Assessment  DOCUMENTATION CODES:   Not applicable  INTERVENTION:   Ensure Enlive po BID, each supplement provides 350 kcal and 20 grams of protein  MVI daily   NUTRITION DIAGNOSIS:   Increased nutrient needs related to hip fracture as evidenced by estimated needs.  GOAL:   Patient will meet greater than or equal to 90% of their needs  MONITOR:   PO intake,Supplement acceptance,Labs,Weight trends,Skin,I & O's  REASON FOR ASSESSMENT:   Consult Hip fracture protocol  ASSESSMENT:   70 y.o. female with past medical history of CVA with residual left-sided spastic hemiparesis, dyslipidemia, hypertension, diabetes mellitus, CHF and dementia (patient resides at [facility]) who is admitted with hip fracture after fall now s/p ORIF 1/22  RD working remotely.  Unable to speak with pt r/t dementia. Suspect pt with decreased appetite and oral intake at baseline. Per chart, pt is down 38lbs(19%) since 2020; RD unsure how recently weight loss occurred as there is no recent documented weight history in chart. Pt NPO today for ORIF. RD will add supplements and MVI to help pt meet her estimated needs. RD will obtain nutrition related exam at follow-up.   Medications reviewed and include: colace, lovenox, insulin, synthroid, MVI, KCl, senokot, tramadol, ProSource Plus  Labs reviewed: BUN 25(H), creat 1.02(H) Wbc- 13.6(H) cbgs- 155, 135 x 24 hrs AIC 5.7(H)- 1/21  NUTRITION - FOCUSED PHYSICAL EXAM: Unable to perform at this time   Diet Order:   Diet Order            Diet Carb Modified Fluid consistency: Thin; Room service appropriate? Yes  Diet effective now                EDUCATION NEEDS:   Not appropriate for education at this time  Skin:  Skin Assessment: Reviewed RN Assessment (incision hip)  Last BM:  1/21  Height:   Ht Readings from Last 1 Encounters:  09/09/20 5' (1.524 m)    Weight:   Wt Readings from Last 1 Encounters:  09/09/20 73.5  kg    Ideal Body Weight:  45.45 kg  BMI:  Body mass index is 31.64 kg/m.  Estimated Nutritional Needs:   Kcal:  1500-1700kcal/day  Protein:  75-85g/day  Fluid:  1.4L/day  Betsey Holiday MS, RD, LDN Please refer to [facility] for RD and/or RD on-call/weekend/after hours pager

## 2020-09-10 NOTE — Progress Notes (Signed)
Pt . Arrived at [facility], I am in Operating room. Will post for surgery this AM for hip cannulated screws for nondisplaced femoral neck fracture.

## 2020-09-10 NOTE — Interval H&P Note (Signed)
History and Physical Interval Note:  09/10/2020 11:37 AM  Dawn Ho  has presented today for surgery, with the diagnosis of femoral neck fracture.  The various methods of treatment have been discussed with the patient and family. After consideration of risks, benefits and other options for treatment, the patient has consented to  Procedure(s): OPEN REDUCTION INTERNAL FIXATION HIP (Right) as a surgical intervention.  The patient's history has been reviewed, patient examined, no change in status, stable for surgery.  I have reviewed the patient's chart and labs.  Questions were answered to the patient's satisfaction.     Eldred Manges

## 2020-09-10 NOTE — Transfer of Care (Signed)
Immediate Anesthesia Transfer of Care Note  Patient: Dawn Ho  Procedure(s) Performed: im nail (Right Hip)  Patient Location: PACU  Anesthesia Type:General  Level of Consciousness: awake, alert  and oriented  Airway & Oxygen Therapy: Patient Spontanous Breathing and Patient connected to face mask oxygen  Post-op Assessment: Report given to RN and Post -op Vital signs reviewed and stable  Post vital signs: Reviewed and stable  Last Vitals:  Vitals Value Taken Time  BP    Temp    Pulse    Resp    SpO2      Last Pain:  Vitals:   09/10/20 0959  TempSrc: Oral  PainSc:          Complications: No complications documented.

## 2020-09-10 NOTE — Anesthesia Procedure Notes (Signed)
Procedure Name: Intubation Date/Time: 09/10/2020 12:13 PM Performed by: Niel Hummer, CRNA Pre-anesthesia Checklist: Patient identified, Emergency Drugs available, Suction available and Patient being monitored Patient Re-evaluated:Patient Re-evaluated prior to induction Oxygen Delivery Method: Circle system utilized Preoxygenation: Pre-oxygenation with 100% oxygen Induction Type: IV induction Ventilation: Mask ventilation without difficulty Laryngoscope Size: Mac and 4 Grade View: Grade I Tube type: Oral Tube size: 7.0 mm Number of attempts: 1 Airway Equipment and Method: Stylet Placement Confirmation: ETT inserted through vocal cords under direct vision,  positive ETCO2 and breath sounds checked- equal and bilateral Secured at: 22 cm Tube secured with: Tape Dental Injury: Teeth and Oropharynx as per pre-operative assessment

## 2020-09-10 NOTE — Anesthesia Preprocedure Evaluation (Addendum)
Anesthesia Evaluation  Patient identified by MRN, date of birth, ID band Patient awake    Reviewed: Allergy & Precautions, H&P , NPO status , Patient's Chart, lab work & pertinent test results, reviewed documented beta blocker date and time   Airway Mallampati: III  TM Distance: >3 FB Neck ROM: Full    Dental no notable dental hx. (+) Partial Lower, Partial Upper, Dental Advisory Given   Pulmonary neg pulmonary ROS,    Pulmonary exam normal breath sounds clear to auscultation       Cardiovascular hypertension, Pt. on medications and Pt. on home beta blockers  Rhythm:Regular Rate:Normal     Neuro/Psych Anxiety Depression TIACVA, Residual Symptoms    GI/Hepatic negative GI ROS, Neg liver ROS,   Endo/Other  diabetes, Insulin Dependent  Renal/GU negative Renal ROS  negative genitourinary   Musculoskeletal  (+) Fibromyalgia -  Abdominal   Peds  Hematology negative hematology ROS (+)   Anesthesia Other Findings   Reproductive/Obstetrics negative OB ROS                            Anesthesia Physical Anesthesia Plan  ASA: III  Anesthesia Plan: General   Post-op Pain Management:    Induction: Intravenous  PONV Risk Score and Plan: 4 or greater and Ondansetron and Treatment may vary due to age or medical condition  Airway Management Planned: Oral ETT  Additional Equipment:   Intra-op Plan:   Post-operative Plan: Extubation in OR  Informed Consent: I have reviewed the patients History and Physical, chart, labs and discussed the procedure including the risks, benefits and alternatives for the proposed anesthesia with the patient or authorized representative who has indicated his/her understanding and acceptance.     Dental advisory given  Plan Discussed with: CRNA  Anesthesia Plan Comments:         Anesthesia Quick Evaluation

## 2020-09-10 NOTE — Progress Notes (Signed)
[facility] Progress Note  Patient: Dawn Ho    JJO:[id number]  DOA: 09/09/2020     Date of Service: the patient was seen and examined on 09/10/2020  Brief hospital course: Past medical history of CVA with residual left-sided weakness, HLD, HTN, type II DM, resident at [facility].Marland Kitchen  Presents with a mechanical fall from a wheelchair with resultant right hip fracture. Orthopedic was consulted.  Patient was transferred to [facility] from [facility] at the request of orthopedics for surgical repair. Currently plan is pain control and monitor postop recovery.  Assessment and Plan: 1.  Right femur fracture Mechanical fall Dr. Ophelia Charter consulted. DVT prophylaxis currently on hold. Pain medication for pain control. Currently will hold Plavix as well. At risk for post op acute anemia due to this though.   2.  History of CVA Residual left-sided weakness. Hyperlipidemia On Plavix.  Currently on hold. Continue statin.  3. type 2 diabetes mellitus, controlled.  No complication. Lantus dose reduced as the patient is NPO. Continue sliding scale insulin.  Monitor.  4.  Essential hypertension Blood pressure stable.  Continue current regimen.  5.  Chronic diastolic CHF Currently euvolemic. Holding Lasix.  6.  Obesity Placing the patient at high risk for poor outcome.  Monitor. Body mass index is 31.64 kg/m.   Diet: N.p.o. pending orthopedic consultation DVT Prophylaxis:   heparin injection 5,000 Units Start: 09/10/20 2200 SCDs Start: 09/09/20 2240   Advance goals of care discussion: Full code  Family Communication: no family was present at bedside, at the time of interview.   Disposition:  Status is: Inpatient  Remains inpatient appropriate because:Ongoing diagnostic testing needed not appropriate for outpatient work up   Dispo: The patient is from: Home              Anticipated d/c is to: SNF              Anticipated d/c date is: 2 days              Patient currently  is not medically stable to d/c.   Difficult to place patient No        Subjective: pain still present, no headache, no chest pain, no nausea, no shortness of breath, no neck pain.   Physical Exam:  General: Appear in mild distress, no Rash; Oral Mucosa Clear, moist. no Abnormal Neck Mass Or lumps, Conjunctiva normal  Cardiovascular: S1 and S2 Present, no Murmur, Respiratory: good respiratory effort, Bilateral Air entry present and CTA, no Crackles, no wheezes Abdomen: Bowel Sound present, Soft and no tenderness Extremities: no Pedal edema Neurology: alert and oriented to time, place, and person affect appropriate. no new focal deficit Gait not checked due to patient safety concerns  Vitals:   09/09/20 2140 09/10/20 0237 09/10/20 0349 09/10/20 0527  BP: (!) 155/66 (!) 149/71 (!) 152/75 (!) 163/73  Pulse: 64 70 83 91  Resp:  16 16 15   Temp: 99.9 F (37.7 C)  98.9 F (37.2 C) 99.1 F (37.3 C)  TempSrc: Oral  Oral Oral  SpO2: 100% 100% 99% 98%  Weight:      Height:        Intake/Output Summary (Last 24 hours) at 09/10/2020 09/12/2020 Last data filed at 09/10/2020 0240 Gross per 24 hour  Intake -  Output 200 ml  Net -200 ml   Filed Weights   09/09/20 1147  Weight: 73.5 kg    Data Reviewed: I have personally reviewed and interpreted daily labs,  tele strips, imaging. I reviewed all nursing notes, pharmacy notes, vitals, pertinent old records I have discussed plan of care as described above with RN and patient/family.  CBC: Recent Labs  Lab 09/09/20 1603 09/10/20 0545  WBC 12.4* 11.2*  NEUTROABS 9.8*  --   HGB 13.2 12.6  HCT 41.3 39.2  MCV 95.8 95.4  PLT 170 157   Basic Metabolic Panel: Recent Labs  Lab 09/09/20 1603 09/10/20 0545  NA 140 141  K 3.8 4.0  CL 107 108  CO2 24 22  GLUCOSE 161* 143*  BUN 31* 25*  CREATININE 1.10* 1.03*  CALCIUM 8.8* 9.0    Studies: DG Chest Portable 1 View  Result Date: 09/09/2020 CLINICAL DATA:  Fall, hip fracture EXAM:  PORTABLE CHEST 1 VIEW COMPARISON:  Portable exam 1555 hours compared to 11/06/2017 FINDINGS: Normal heart size, mediastinal contours, and pulmonary vascularity. Loop recorder projects over chest. Atherosclerotic calcification aorta. Lungs clear. No pleural effusion or pneumothorax. Diffuse osseous demineralization. IMPRESSION: No acute abnormalities. Aortic Atherosclerosis (ICD10-I70.0). Electronically Signed   By: Ulyses Southward M.D.   On: 09/09/2020 16:06   DG Hip Unilat With Pelvis 2-3 Views Right  Result Date: 09/09/2020 CLINICAL DATA:  Larey Seat, right hip pain EXAM: DG HIP (WITH OR WITHOUT PELVIS) 2-3V RIGHT COMPARISON:  None. FINDINGS: Frontal view of the pelvis as well as frontal and frogleg lateral views of the right hip are obtained. Evaluation is limited by body habitus and technique. There is a minimally displaced basicervical right femoral neck fracture with near anatomic alignment. No dislocation. Joint spaces are well preserved. Sclerotic focus left iliac crest likely bone island. IMPRESSION: 1. Minimally displaced basicervical right femoral neck fracture. Near anatomic alignment. Electronically Signed   By: Sharlet Salina M.D.   On: 09/09/2020 14:56    Scheduled Meds: . heparin  5,000 Units Subcutaneous Q8H  . insulin aspart  0-5 Units Subcutaneous QHS  . insulin aspart  0-9 Units Subcutaneous TID WC  . insulin glargine  14 Units Subcutaneous QHS   Continuous Infusions: PRN Meds: HYDROcodone-acetaminophen, morphine injection  Time spent: 35 minutes  Author: Lynden Oxford, MD [facility] 09/10/2020 8:08 AM  To reach On-call, see care teams to locate the attending and reach out via [email or web address]. Between 7PM-7AM, please contact night-coverage If you still have difficulty reaching the attending provider, please page the [facility] (Director on Call) for [facility] on amion for assistance.

## 2020-09-10 NOTE — Consult Note (Signed)
Reason for Consult:right femoral neck nondisplaced fracture Referring Physician: Allena Katz MD  Dawn Ho is an 70 y.o. female.  HPI: 70 year old skilled nursing facility patient states that [facility] and mechanical fall with right nondisplaced femoral neck fracture.  She has had previous CVA with left-sided spastic hemiparesis.  Additional problems include diabetes mellitus, hypertension hyperlipidemia.  She stands to transfer minimal ambulator.  After she fell she is unable to weight-bear on the right lower extremity and was taken to [facility], ER where x-rays demonstrated nondisplaced femoral neck fracture.  Patient stayed and 80 p.m. for the last 24 hours pending bed availability at [facility] for surgical fixation due to hospital being full.  Bed became available patient was transferred and now is here for screw fixation of her nondisplaced fracture.  Past Medical History:  Diagnosis Date  . Cataract, left eye   . CVA (cerebral infarction)   . Depression with anxiety   . Diabetes mellitus   . Dyslipidemia   . Fibromyalgia   . Hypertension   . Retinopathy   . Stroke (HCC)    L. hemiparesis  . TIA (transient ischemic attack)     Past Surgical History:  Procedure Laterality Date  . ABDOMINAL HYSTERECTOMY     age 56, "removed one ovary- excessive bleeding"  . CESAREAN SECTION    . CHOLECYSTECTOMY    . LOOP RECORDER IMPLANT     Dr. Johney Frame placed 10/15/2014  . TEE WITHOUT CARDIOVERSION N/A 10/15/2014   Procedure: TRANSESOPHAGEAL ECHOCARDIOGRAM (TEE);  Surgeon: Pricilla Riffle, MD;  Location: [facility] ENDOSCOPY;  Service: Cardiovascular;  Laterality: N/A;    Family History  Problem Relation Age of Onset  . Heart disease Father   . Hypertension Sister   . Colon cancer Neg Hx   . Colon polyps Neg Hx     Social History:  reports that she has never smoked. She has never used smokeless tobacco. She reports that she does not drink alcohol and does not use drugs.  Allergies:  Allergies   Allergen Reactions  . Ivp Dye [Iodinated Diagnostic Agents]     Per pt: swelling with first contrast xray in 1969. Second contrast xray in the 90s without symptoms:  Patient has been pre-medicated for CT scans in 2016. Needs to have pre-medications before any scans with IV contrast. / TSF 08/30/16  . Penicillins Swelling and Rash    Medications: I have reviewed the patient's current medications.  Results for orders placed or performed during the hospital encounter of 09/09/20 (from the past 48 hour(s))  Basic metabolic panel     Status: Abnormal   Collection Time: 09/09/20  4:03 PM  Result Value Ref Range   Sodium 140 135 - 145 mmol/L   Potassium 3.8 3.5 - 5.1 mmol/L   Chloride 107 98 - 111 mmol/L   CO2 24 22 - 32 mmol/L   Glucose, Bld 161 (H) 70 - 99 mg/dL    Comment: Glucose reference range applies only to samples taken after fasting for at least 8 hours.   BUN 31 (H) 8 - 23 mg/dL   Creatinine, Ser 8.46 (H) 0.44 - 1.00 mg/dL   Calcium 8.8 (L) 8.9 - 10.3 mg/dL   GFR, Estimated 54 (L) >60 mL/min    Comment: (NOTE) Calculated using the CKD-EPI Creatinine Equation (2021)    Anion gap 9 5 - 15    Comment: Performed at [facility], [address] [place], [place] [address]  CBC WITH DIFFERENTIAL  Status: Abnormal   Collection Time: 09/09/20  4:03 PM  Result Value Ref Range   WBC 12.4 (H) 4.0 - 10.5 K/uL   RBC 4.31 3.87 - 5.11 MIL/uL   Hemoglobin 13.2 12.0 - 15.0 g/dL   HCT 93.8 10.1 - 75.1 %   MCV 95.8 80.0 - 100.0 fL   MCH 30.6 26.0 - 34.0 pg   MCHC 32.0 30.0 - 36.0 g/dL   RDW 02.5 85.2 - 77.8 %   Platelets 170 150 - 400 K/uL   nRBC 0.0 0.0 - 0.2 %   Neutrophils Relative % 79 %   Neutro Abs 9.8 (H) 1.7 - 7.7 K/uL   Lymphocytes Relative 13 %   Lymphs Abs 1.6 0.7 - 4.0 K/uL   Monocytes Relative 6 %   Monocytes Absolute 0.7 0.1 - 1.0 K/uL   Eosinophils Relative 1 %   Eosinophils Absolute 0.1 0.0 - 0.5 K/uL   Basophils Relative 0 %   Basophils Absolute 0.0 0.0 - 0.1  K/uL   Immature Granulocytes 1 %   Abs Immature Granulocytes 0.07 0.00 - 0.07 K/uL    Comment: Performed at [facility], [address], [place], [place] [address]  Protime-INR     Status: None   Collection Time: 09/09/20  4:03 PM  Result Value Ref Range   Prothrombin Time 12.1 11.4 - 15.2 seconds   INR 0.9 0.8 - 1.2    Comment: (NOTE) INR goal varies based on device and disease states. Performed at [facility], [address], [place], [place] [address]   Type and screen [facility]     Status: None   Collection Time: 09/09/20  4:12 PM  Result Value Ref Range   ABO/RH(D) O POS    Antibody Screen NEG    Sample Expiration      09/12/2020,2359 Performed at [facility], [address] [place], [place] [address]   ABO/Rh     Status: None   Collection Time: 09/09/20  5:21 PM  Result Value Ref Range   ABO/RH(D)      O POS Performed at [facility], [address] [place], [place] [address]   SARS CORONAVIRUS 2 (TAT 6-24 HRS) Nasopharyngeal Nasopharyngeal Swab     Status: None   Collection Time: 09/09/20  9:52 PM   Specimen: Nasopharyngeal Swab  Result Value Ref Range   SARS Coronavirus 2 NEGATIVE NEGATIVE    Comment: (NOTE) SARS-CoV-2 target nucleic acids are NOT DETECTED.  The SARS-CoV-2 RNA is generally detectable in upper and lower respiratory specimens during the acute phase of infection. Negative results do not preclude SARS-CoV-2 infection, do not rule out co-infections with other pathogens, and should not be used as the sole basis for treatment or other patient management decisions. Negative results must be combined with clinical observations, patient history, and epidemiological information. The expected result is Negative.  Fact Sheet for Patients: HairSlick.no  Fact Sheet for Healthcare Providers: quierodirigir.com  This test is not yet approved or cleared by the [place] FDA and  has been  authorized for detection and/or diagnosis of SARS-CoV-2 by FDA under an Emergency Use Authorization (EUA). This EUA will remain  in effect (meaning this test can be used) for the duration of the COVID-19 declaration under Se ction 564(b)(1) of the Act, 21 U.S.C. section 360bbb-3(b)(1), unless the authorization is terminated or revoked sooner.  Performed at [facility], [address], [place], [place] [address]   Type and screen [facility]     Status: None   Collection Time: 09/09/20 10:40 PM  Result Value Ref Range   ABO/RH(D) O POS    Antibody Screen NEG    Sample Expiration      09/12/2020,2359 Performed at [facility], [address] [place], [place] [address]   Glucose, capillary     Status: Abnormal   Collection Time: 09/09/20 11:33 PM  Result Value Ref Range   Glucose-Capillary 155 (H) 70 - 99 mg/dL    Comment: Glucose reference range applies only to samples taken after fasting for at least 8 hours.  MRSA PCR Screening     Status: None   Collection Time: 09/09/20 11:36 PM   Specimen: Nasal Mucosa; Nasopharyngeal  Result Value Ref Range   MRSA by PCR NEGATIVE NEGATIVE    Comment:        The GeneXpert MRSA Assay (FDA approved for NASAL specimens only), is one component of a comprehensive MRSA colonization surveillance program. It is not intended to diagnose MRSA infection nor to guide or monitor treatment for MRSA infections. Performed at [facility], [address] [place], [place] [address]   CBC     Status: Abnormal   Collection Time: 09/10/20  5:45 AM  Result Value Ref Range   WBC 11.2 (H) 4.0 - 10.5 K/uL   RBC 4.11 3.87 - 5.11 MIL/uL   Hemoglobin 12.6 12.0 - 15.0 g/dL   HCT 44.039.2 10.236.0 - 72.546.0 %   MCV 95.4 80.0 - 100.0 fL   MCH 30.7 26.0 - 34.0 pg   MCHC 32.1 30.0 - 36.0 g/dL   RDW 36.612.6 44.011.5 - 34.715.5 %   Platelets 157 150 - 400 K/uL   nRBC 0.0 0.0 - 0.2 %    Comment: Performed at [facility], [address] [place], [place] [address]  Basic metabolic panel     Status: Abnormal   Collection Time: 09/10/20  5:45 AM  Result Value Ref Range   Sodium 141 135 - 145 mmol/L   Potassium 4.0 3.5 - 5.1 mmol/L   Chloride 108 98 - 111 mmol/L   CO2 22 22 - 32 mmol/L   Glucose, Bld 143 (H) 70 - 99 mg/dL    Comment: Glucose reference range applies only to samples taken after fasting for at least 8 hours.   BUN 25 (H) 8 - 23 mg/dL   Creatinine, Ser 6.381.03 (H) 0.44 - 1.00 mg/dL   Calcium 9.0 8.9 - 75.610.3 mg/dL   GFR, Estimated 58 (L) >60 mL/min    Comment: (NOTE) Calculated using the CKD-EPI Creatinine Equation (2021)    Anion gap 11 5 - 15    Comment: Performed at [facility], [address] [place], [place] [address]    DG Chest Portable 1 View  Result Date: 09/09/2020 CLINICAL DATA:  Fall, hip fracture EXAM: PORTABLE CHEST 1 VIEW COMPARISON:  Portable exam 1555 hours compared to 11/06/2017 FINDINGS: Normal heart size, mediastinal contours, and pulmonary vascularity. Loop recorder projects over chest. Atherosclerotic calcification aorta. Lungs clear. No pleural effusion or pneumothorax. Diffuse osseous demineralization. IMPRESSION: No acute abnormalities. Aortic Atherosclerosis (ICD10-I70.0). Electronically Signed   By: Ulyses SouthwardMark  Boles M.D.   On: 09/09/2020 16:06   DG Hip Unilat With Pelvis 2-3 Views Right  Result Date: 09/09/2020 CLINICAL DATA:  Larey SeatFell, right hip pain EXAM: DG HIP (WITH OR WITHOUT PELVIS) 2-3V RIGHT COMPARISON:  None. FINDINGS: Frontal view of the pelvis as well as  frontal and frogleg lateral views of the right hip are obtained. Evaluation is limited by body habitus and technique. There is a minimally displaced basicervical right femoral neck fracture with near anatomic alignment. No dislocation. Joint spaces are well preserved. Sclerotic focus left iliac crest likely bone island. IMPRESSION: 1. Minimally displaced basicervical right  femoral neck fracture. Near anatomic alignment. Electronically Signed   By: Sharlet Salina M.D.   On: 09/09/2020 14:56    ROS U of systems positive for diabetes previous CVA left hemiparesis.  Hypertension dyslipidemia.  Patient's son is power of attorney.  All other systems noncontributory to HPI. Blood pressure (!) 154/78, pulse 91, temperature 98.2 F (36.8 C), temperature source Oral, resp. rate 15, height 5' (1.524 m), weight 73.5 kg, SpO2 99 %. Physical Exam Constitutional:      Comments: Patient mildly confused conversant pleasant.  HENT:     Head: Atraumatic.     Nose: Nose normal.  Eyes:     Extraocular Movements: Extraocular movements intact.     Pupils: Pupils are equal, round, and reactive to light.  Cardiovascular:     Rate and Rhythm: Normal rate.     Pulses: Normal pulses.  Pulmonary:     Effort: Pulmonary effort is normal.  Abdominal:     Palpations: Abdomen is soft.  Musculoskeletal:     Cervical back: Normal range of motion.  Skin:    Coloration: Skin is not jaundiced.  Neurological:     Comments: Left spastic hemiparesis secondary to CVA  Psychiatric:     Comments: Patient confused pleasant conversant.     Assessment/Plan: Patient has multiple medical problems but with dementia she is at risk for displacement of her femoral neck fracture which is currently nondisplaced.  I discussed with her son who has power of attorney plan procedure for cannulated screw fixation for stabilization of her fracture.  She will need to be nonweightbearing for 6 weeks and then could begin weightbearing again once her fracture heals.  Patient had some increased risk due to all of her other medical problems.  Questions were elicited with son informed consent was obtained over the phone with nurse RN present. Eldred Manges 09/10/2020, 12:00 PM

## 2020-09-10 NOTE — Op Note (Addendum)
Preop diagnosis: Right closed minimally displaced basicervical femoral neck fracture.  Postop diagnosis: Right intertrochanteric hip fracture  Procedure: Right short trochanteric nail.  (Affixus 11 x 180 mm 130 degree short nail with 90 mm lag screw and 34 mm distal interlock screw)  Surgeon: Ophelia Charter MD  Anesthesia: General +20 cc Marcaine local at end of case.  EBL less than 100 cc  Findings: Patient was admitted originally to [facility] since no beds were available at [facility] nor at [facility] at the time on Friday.  Eventually she was transferred down to [facility] when a bed came available with transfer by EMS and was admitted to the third floor at [facility].  Patient had a femoral neck fracture basicervical which was essentially nondisplaced and I reviewed those x-rays with plan for cannulated screw fixation of her hip.  Patient had previous stroke and transferred minimal ambulator and had osteoporosis with decreased bone density noted on x-ray but her fracture did appear to be nondisplaced or minimally displaced.  When patient got to the operating room after induction of anesthesia with me carefully transferring her to the [facility] table with care taken not displace the fracture C-arm was used for visualization and the fracture extended into the  lesser trochanter  was a separate fragment minimally displaced.  This did not have the appearance of femoral neck fracture but was an intertrochanteric fracture and cannulated screw fixation with unstable medial buttress would be unsatisfactory and have high failure rate.  Plan was changed to short trochanteric nail for stabilization of her hip.  Patient opposite leg was in a well leg holder.  Foot was slightly internally rotated no traction was applied.  Hip down to the knee joint was prepped with DuraPrep after dry there is squared with towels and large shower curtain Betadine Steri-Drape applied timeout procedure was  completed antibiotics were given clindamycin due to her penicillin allergy.  Incision was made starting proximal trochanter 5 cm.  Gluteus medius fascia was split tip the trochanter was palpated I placed the Steinmann pin at the tip confirmed with C arm and then drilled it in overreamed it and 11 mm short nail was carefully placed down the shaft.  With C-arm visualization the neck was parallel to the floor and perpendicular to this interposed between the legs.  Pin was drilled up center in the head on AP and slightly posterior on lateral confirmed with 2 view imaging with the fluoroscopy.  It was measured and a 90 mm lag screw was placed which was appropriate length.  Hip was carefully rolled to make sure did not penetrate and was firmly in the head.  Distal interlock was placed standard fashion 34 mm had good bicortical bite.  The screw was locked down proximally.  Lateral jig was removed final spot pictures AP lateral were taken.  Hip was carefully rotated under fluoroscopy good position of the screw in the head it was stable.  There was good reduction irrigation with saline solution closure of the deep fascia with #1 Vicryl 2-0 Vicryl subtendinous tissue skin staple closure Marcaine infiltration in the skin.  I called and discussed with the patient's son who has power of attorney the hip fracture and the change from plan cannulated screw to the short trochanteric nail.  Patient was stable throughout the procedure and was transferred to care room in stable condition.

## 2020-09-10 NOTE — Anesthesia Postprocedure Evaluation (Signed)
Anesthesia Post Note  Patient: Dawn Ho  Procedure(s) Performed: im nail (Right Hip)     Patient location during evaluation: PACU Anesthesia Type: General Level of consciousness: awake and alert Pain management: pain level controlled Vital Signs Assessment: post-procedure vital signs reviewed and stable Respiratory status: spontaneous breathing, nonlabored ventilation, respiratory function stable and patient connected to nasal cannula oxygen Cardiovascular status: blood pressure returned to baseline and stable Postop Assessment: no apparent nausea or vomiting Anesthetic complications: no   No complications documented.  Last Vitals:  Vitals:   09/10/20 1415 09/10/20 1430  BP: (!) 144/80 (!) 152/71  Pulse: (!) 114 (!) 110  Resp: 16 (!) 21  Temp:    SpO2: 97% 97%    Last Pain:  Vitals:   09/10/20 1430  TempSrc:   PainSc: Asleep                 Samanth Mirkin,W. EDMOND

## 2020-09-11 DIAGNOSIS — S72001A Fracture of unspecified part of neck of right femur, initial encounter for closed fracture: Secondary | ICD-10-CM | POA: Diagnosis not present

## 2020-09-11 LAB — CBC
HCT: 30.1 % — ABNORMAL LOW (ref 36.0–46.0)
HCT: 31.6 % — ABNORMAL LOW (ref 36.0–46.0)
Hemoglobin: 9.8 g/dL — ABNORMAL LOW (ref 12.0–15.0)
Hemoglobin: 10.3 g/dL — ABNORMAL LOW (ref 12.0–15.0)
MCH: 31 pg (ref 26.0–34.0)
MCH: 30.9 pg (ref 26.0–34.0)
MCHC: 32.6 g/dL (ref 30.0–36.0)
MCHC: 32.6 g/dL (ref 30.0–36.0)
MCV: 95.3 fL (ref 80.0–100.0)
MCV: 94.9 fL (ref 80.0–100.0)
Platelets: 154 10*3/uL (ref 150–400)
Platelets: 116 10*3/uL — ABNORMAL LOW (ref 150–400)
RBC: 3.16 MIL/uL — ABNORMAL LOW (ref 3.87–5.11)
RBC: 3.33 MIL/uL — ABNORMAL LOW (ref 3.87–5.11)
RDW: 12.8 % (ref 11.5–15.5)
RDW: 12.8 % (ref 11.5–15.5)
WBC: 12.1 10*3/uL — ABNORMAL HIGH (ref 4.0–10.5)
WBC: 10.4 10*3/uL (ref 4.0–10.5)
nRBC: 0 % (ref 0.0–0.2)
nRBC: 0 % (ref 0.0–0.2)

## 2020-09-11 LAB — BASIC METABOLIC PANEL
Anion gap: 9 (ref 5–15)
BUN: 32 mg/dL — ABNORMAL HIGH (ref 8–23)
CO2: 23 mmol/L (ref 22–32)
Calcium: 8.8 mg/dL — ABNORMAL LOW (ref 8.9–10.3)
Chloride: 109 mmol/L (ref 98–111)
Creatinine, Ser: 1.42 mg/dL — ABNORMAL HIGH (ref 0.44–1.00)
GFR, Estimated: 40 mL/min — ABNORMAL LOW (ref >60)
Glucose, Bld: 127 mg/dL — ABNORMAL HIGH (ref 70–99)
Potassium: 4.7 mmol/L (ref 3.5–5.1)
Sodium: 141 mmol/L (ref 135–145)

## 2020-09-11 LAB — GLUCOSE, CAPILLARY
Glucose-Capillary: 101 mg/dL — ABNORMAL HIGH (ref 70–99)
Glucose-Capillary: 109 mg/dL — ABNORMAL HIGH (ref 70–99)
Glucose-Capillary: 121 mg/dL — ABNORMAL HIGH (ref 70–99)
Glucose-Capillary: 113 mg/dL — ABNORMAL HIGH (ref 70–99)

## 2020-09-11 MED ORDER — SODIUM CHLORIDE 0.9 % IV BOLUS
1000.0000 mL | INTRAVENOUS | Status: AC
  Administered 2020-09-11: 1000 mL via INTRAVENOUS

## 2020-09-11 MED ORDER — HYDROCODONE-ACETAMINOPHEN 5-325 MG PO TABS: 1.0000 | ORAL_TABLET | Freq: Four times a day (QID) | ORAL | 0 refills | Status: AC | PRN

## 2020-09-11 MED ORDER — SODIUM CHLORIDE 0.9 % IV BOLUS
250.0000 mL | Freq: Once | INTRAVENOUS | Status: AC
  Administered 2020-09-11: 08:00:00 250 mL via INTRAVENOUS

## 2020-09-11 MED ORDER — SENNOSIDES-DOCUSATE SODIUM 8.6-50 MG PO TABS
1.0000 | ORAL_TABLET | Freq: Two times a day (BID) | ORAL | Status: DC
  Administered 2020-09-11 – 2020-09-12 (×3): 1 via ORAL
  Filled 2020-09-11 (×3): qty 1

## 2020-09-11 MED ORDER — LACTATED RINGERS IV SOLN: INTRAVENOUS | Status: AC

## 2020-09-11 MED ORDER — ASPIRIN 325 MG PO TABS: 325.0000 mg | ORAL_TABLET | Freq: Every day | ORAL | Status: DC

## 2020-09-11 MED ORDER — GABAPENTIN 300 MG PO CAPS
300.0000 mg | ORAL_CAPSULE | Freq: Two times a day (BID) | ORAL | Status: DC
  Administered 2020-09-11 – 2020-09-12 (×3): 300 mg via ORAL
  Filled 2020-09-11 (×3): qty 1

## 2020-09-11 NOTE — TOC Initial Note (Signed)
Transition of Care [facility]) - Initial/Assessment Note    Patient Details  Name: Dawn Ho MRN: [id number] Date of Birth: September 23, 1950  [facility]) CM/SW Contact:    Darleene Cleaver, LCSW Phone Number: 09/11/2020, 3:42 PM  Clinical Narrative:                  Patient is a 70 year old female who is alert and oriented x1.  Due to patient having some confusion, CSW completed assessment by reviewing patient's chart.  Patient has been living at [facility].  The plan is to return back to facility.  CSW has completed FL2, and faxed clinicals to SNF.  CSW to continue to to follow patient's progress throughout discharge planning.  Expected Discharge Plan: Skilled Nursing Facility Barriers to Discharge: Continued Medical Work up   Patient Goals and CMS Choice Patient states their goals for this hospitalization and ongoing recovery are:: To return back to [facility] CMS Medicare.gov Compare Post Acute Care list provided to:: Patient Represenative (must comment) Choice offered to / list presented to : Adult Children  Expected Discharge Plan and Services Expected Discharge Plan: Skilled Nursing Facility In-house Referral: Clinical Social Work   Post Acute Care Choice: Skilled Nursing Facility Living arrangements for the past 2 months: Skilled Nursing Facility                                      Prior Living Arrangements/Services Living arrangements for the past 2 months: Skilled Nursing Facility Lives with:: Facility Resident Patient language and need for interpreter reviewed:: Yes Do you feel safe going back to the place where you live?: Yes      Need for Family Participation in Patient Care: Yes (Comment) Care giver support system in place?: Yes (comment)   Criminal Activity/Legal Involvement Pertinent to Current Situation/Hospitalization: No - Comment as needed  Activities of Daily Living Home Assistive Devices/Equipment: Wheelchair ADL Screening  (condition at time of admission) Patient's cognitive ability adequate to safely complete daily activities?: No Is the patient deaf or have difficulty hearing?: Yes Does the patient have difficulty seeing, even when wearing glasses/contacts?: Yes Does the patient have difficulty concentrating, remembering, or making decisions?: Yes Patient able to express need for assistance with ADLs?: No Does the patient have difficulty dressing or bathing?: Yes Independently performs ADLs?: No Does the patient have difficulty walking or climbing stairs?: Yes Weakness of Legs: Left Weakness of Arms/Hands: Left  Permission Sought/Granted Permission sought to share information with : Facility Contact Representative,Family Supports Permission granted to share information with : Yes, Release of Information Signed  Share Information with NAME: Glenetta Borg [phone]    Yvonne Kendall Daughter [phone]  Permission granted to share info w AGENCY: SNF admissions        Emotional Assessment Appearance:: Appears stated age Attitude/Demeanor/Rapport: Lethargic Affect (typically observed): Accepting,Calm,Appropriate,Stable Orientation: : Oriented to Self Alcohol / Substance Use: Not Applicable Psych Involvement: No (comment)  Admission diagnosis:  Closed right hip fracture (HCC) [S72.001A] Closed right hip fracture, initial encounter [facility]) [S72.001A] Patient Active Problem List   Diagnosis Date Noted  . Closed right hip fracture, initial encounter (HCC) 09/09/2020  . Hypomagnesemia 11/12/2017  . Aspiration into airway   . Acute respiratory distress   . Lung crackles   . Ileus (HCC)   . Abdominal distension   . Leukocytosis   . Diarrhea   . UTI (urinary  tract infection) 10/31/2017  . Hypokalemia 10/31/2017  . 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 Spastic hemiparesis (HCC) 01/12/2015  . HLD (hyperlipidemia)   . History of recent stroke   . Accelerated hypertension   . Acute cystitis  without hematuria   . Tachycardia   . Stroke (HCC) 11/17/2014  . Left-sided weakness 11/17/2014  . 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 CVA (cerebral vascular accident) (HCC) 10/14/2014  . Cerebral infarction (HCC)   . TIA (transient ischemic attack) 10/13/2014  . Chest pain 08/07/2011  . Fibromyalgia 08/07/2011  . Costochondritis 08/07/2011  . Diabetes mellitus (HCC) 08/07/2011  . Depression with anxiety 08/07/2011  . Obesity 08/07/2011  . History of TIAs 08/07/2011  . Hyperlipidemia 08/07/2011   PCP:  Bernerd Limbo, MD Pharmacy:   [facility] [place] - [address] [place] [address] Phone: [phone] Fax: [phone]     Social Determinants of Health (SDOH) Interventions    Readmission Risk Interventions No flowsheet data found.

## 2020-09-11 NOTE — Progress Notes (Signed)
Surgical dsg removed per order, noted skin clips intact to right hip, sm amt blood oozing from proximal top of incision. Placed mepilex dsgs over incisions. Tol well, remains w good cms to R foot.

## 2020-09-11 NOTE — Discharge Instructions (Signed)
See Dr. Ophelia Charter in 2 wks.  This can be at [facility] clinic on a Thursday if more convenient than coming to [place] office.  [phone] call for appt.   Non weight bearing on right Lower extremity times 6 wks.

## 2020-09-11 NOTE — Progress Notes (Signed)
   Subjective: 1 Day Post-Op Procedure(s) (LRB): im nail (Right) Patient reports pain as mild.    Objective: Vital signs in last 24 hours: Temp:  [97.6 F (36.4 C)-99.6 F (37.6 C)] 97.6 F (36.4 C) (01/23 0953) Pulse Rate:  [64-122] 65 (01/23 0953) Resp:  [15-21] 16 (01/23 0953) BP: (90-175)/(46-85) 90/46 (01/23 0953) SpO2:  [97 %-100 %] 99 % (01/23 0953)  Intake/Output from previous day: 01/22 0701 - 01/23 0700 In: 1610 [P.O.:760; I.V.:800; IV Piggyback:50] Out: 750 [Urine:700; Blood:50] Intake/Output this shift: Total I/O In: 490 [P.O.:240; IV Piggyback:250] Out: 300 [Urine:300]  Recent Labs    09/09/20 1603 09/10/20 0545 09/10/20 1406 09/11/20 0529  HGB 13.2 12.6 12.0 9.8*   Recent Labs    09/10/20 1406 09/11/20 0529  WBC 13.6* 12.1*  RBC 3.90 3.16*  HCT 38.0 30.1*  PLT 138* 154   Recent Labs    09/10/20 0545 09/10/20 1406 09/11/20 0529  NA 141  --  141  K 4.0  --  4.7  CL 108  --  109  CO2 22  --  23  BUN 25*  --  32*  CREATININE 1.03* 1.02* 1.42*  GLUCOSE 143*  --  127*  CALCIUM 9.0  --  8.8*   Recent Labs    09/09/20 1603  INR 0.9    moves right LE . dressing intact DG C-Arm 1-60 Min-No Report  Result Date: 09/10/2020 Fluoroscopy was utilized by the requesting physician.  No radiographic interpretation.   DG HIP OPERATIVE UNILAT WITH PELVIS RIGHT  Result Date: 09/10/2020 CLINICAL DATA:  Right hip IM nail EXAM: OPERATIVE RIGHT HIP (WITH PELVIS IF PERFORMED) 3 VIEWS TECHNIQUE: Fluoroscopic spot image(s) were submitted for interpretation post-operatively. FLUOROSCOPY TIME:  24 seconds 9.2 mGy COMPARISON:  Right hip radiographs dated 09/09/2020 FINDINGS: Intraoperative fluoroscopic images during IM nail with dynamic hip screw fixation of a right hip fracture. Fracture fragments are in near anatomic alignment and position. IMPRESSION: Intraoperative fluoroscopic images during ORIF of a right hip fracture, as above. Electronically Signed   By:  Charline Bills M.D.   On: 09/10/2020 14:24    Assessment/Plan: 1 Day Post-Op Procedure(s) (LRB): im nail (Right) Plan:  Goal transfers to Recliner. Has hemiparesis on left and right side was strong side. NWB for 6 wks due to poor bone quality.  Dawn Ho 09/11/2020, 10:32 AM

## 2020-09-11 NOTE — NC FL2 (Signed)
[place] MEDICAID FL2 LEVEL OF CARE SCREENING TOOL     IDENTIFICATION  Patient Name: Dawn Ho Birthdate: 1951-07-31 Sex: female Admission Date (Current Location): 09/09/2020  [place] and [place] Number:  Aaron Edelman [id number] T Facility and Address:  [facility],  [address], [place] [address]      Provider Number: [id number]  Attending Physician Name and Address:  Rolly Salter, MD  Relative Name and Phone Number:  Glenetta Borg [phone] or Yvonne Kendall Daughter [phone]    Current Level of Care: Hospital Recommended Level of Care: Skilled Nursing Facility Prior Approval Number:    Date Approved/Denied:   PASRR Number: [id number]  Discharge Plan: SNF    Current Diagnoses: Patient Active Problem List   Diagnosis Date Noted  . Closed right hip fracture, initial encounter (HCC) 09/09/2020  . Hypomagnesemia 11/12/2017  . Aspiration into airway   . Acute respiratory distress   . Lung crackles   . Ileus (HCC)   . Abdominal distension   . Leukocytosis   . Diarrhea   . UTI (urinary tract infection) 10/31/2017  . Hypokalemia 10/31/2017  . 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 Spastic hemiparesis (HCC) 01/12/2015  . HLD (hyperlipidemia)   . History of recent stroke   . Accelerated hypertension   . Acute cystitis without hematuria   . Tachycardia   . Stroke (HCC) 11/17/2014  . Left-sided weakness 11/17/2014  . 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 CVA (cerebral vascular accident) (HCC) 10/14/2014  . Cerebral infarction (HCC)   . TIA (transient ischemic attack) 10/13/2014  . Chest pain 08/07/2011  . Fibromyalgia 08/07/2011  . Costochondritis 08/07/2011  . Diabetes mellitus (HCC) 08/07/2011  . Depression with anxiety 08/07/2011  . Obesity 08/07/2011  . History of TIAs 08/07/2011  . Hyperlipidemia 08/07/2011    Orientation RESPIRATION BLADDER Height & Weight     Self  O2 (2L) Incontinent Weight:  162 lb (73.5 kg) Height:  5' (152.4 cm)  BEHAVIORAL SYMPTOMS/MOOD NEUROLOGICAL BOWEL NUTRITION STATUS      Incontinent Diet (Carb Modified)  AMBULATORY STATUS COMMUNICATION OF NEEDS Skin   Limited Assist Verbally Surgical wounds                       Personal Care Assistance Level of Assistance  Bathing,Dressing,Feeding Bathing Assistance: Limited assistance Feeding assistance: Limited assistance Dressing Assistance: Limited assistance     Functional Limitations Info  Sight,Hearing,Speech Sight Info: Adequate Hearing Info: Adequate Speech Info: Adequate    SPECIAL CARE FACTORS FREQUENCY  PT (By licensed PT),OT (By licensed OT)     PT Frequency: Minimum 5x a week OT Frequency: Minimum 5x a week            Contractures Contractures Info: Not present    Additional Factors Info  Code Status,Allergies,Psychotropic,Insulin Sliding Scale,Isolation Precautions Code Status Info: Full Code Allergies Info: Ivp Dye, Penicillins Psychotropic Info: escitalopram (LEXAPRO) tablet 20 mg Insulin Sliding Scale Info: insulin aspart (novoLOG) injection 0-9 Units 3x a day with meals. Isolation Precautions Info: Air/Con precautions and MRSA     Current Medications (09/11/2020):  This is the current hospital active medication list Current Facility-Administered Medications  Medication Dose Route Frequency Provider Last Rate Last Admin  . acetaminophen (TYLENOL) tablet 325-650 mg  325-650 mg Oral Q6H PRN Eldred Manges, MD      . atorvastatin (LIPITOR) tablet 20 mg  20 mg Oral q1800 Eldred Manges, MD   20 mg at  09/10/20 1730  . ciprofloxacin (CILOXAN) 0.3 % ophthalmic solution 1 drop  1 drop Left EAR BID Rolly Salter, MD   1 drop at 09/11/20 1048  . enoxaparin (LOVENOX) injection 40 mg  40 mg Subcutaneous Q24H Eldred Manges, MD   40 mg at 09/11/20 0754  . escitalopram (LEXAPRO) tablet 20 mg  20 mg Oral Daily Eldred Manges, MD   20 mg at 09/11/20 1047  . feeding supplement (ENSURE  ENLIVE / ENSURE PLUS) liquid 237 mL  237 mL Oral BID BM Rolly Salter, MD   237 mL at 09/11/20 1048  . gabapentin (NEURONTIN) capsule 300 mg  300 mg Oral BID Rolly Salter, MD   300 mg at 09/11/20 1047  . insulin aspart (novoLOG) injection 0-5 Units  0-5 Units Subcutaneous QHS Eldred Manges, MD   2 Units at 09/10/20 2159  . insulin aspart (novoLOG) injection 0-9 Units  0-9 Units Subcutaneous TID WC Eldred Manges, MD   3 Units at 09/10/20 1814  . insulin glargine (LANTUS) injection 14 Units  14 Units Subcutaneous QHS Eldred Manges, MD   14 Units at 09/10/20 2224  . lactated ringers infusion   Intravenous Continuous Rolly Salter, MD 50 mL/hr at 09/11/20 0900 Infusion Verify at 09/11/20 0900  . levothyroxine (SYNTHROID) tablet 112 mcg  112 mcg Oral Daily Eldred Manges, MD   112 mcg at 09/11/20 0531  . lidocaine (LIDODERM) 5 % 1 patch  1 patch Transdermal Daily Eldred Manges, MD   1 patch at 09/11/20 1047  . menthol-cetylpyridinium (CEPACOL) lozenge 3 mg  1 lozenge Oral PRN Eldred Manges, MD       Or  . phenol (CHLORASEPTIC) mouth spray 1 spray  1 spray Mouth/Throat PRN Eldred Manges, MD      . morphine 2 MG/ML injection 0.5 mg  0.5 mg Intravenous Q2H PRN Eldred Manges, MD   0.5 mg at 09/10/20 1043  . multivitamin with minerals tablet 1 tablet  1 tablet Oral Daily Eldred Manges, MD   1 tablet at 09/11/20 1047  . nitroGLYCERIN (NITROSTAT) SL tablet 0.4 mg  0.4 mg Sublingual Q5 min PRN Eldred Manges, MD      . nystatin (MYCOSTATIN/NYSTOP) topical powder 1 application  1 application Topical BID Eldred Manges, MD   1 application at 09/11/20 1048  . ondansetron (ZOFRAN) tablet 4 mg  4 mg Oral Q6H PRN Eldred Manges, MD       Or  . ondansetron [facility]) injection 4 mg  4 mg Intravenous Q6H PRN Eldred Manges, MD      . oxyCODONE (Oxy IR/ROXICODONE) immediate release tablet 5-10 mg  5-10 mg Oral Q4H PRN Eldred Manges, MD   5 mg at 09/11/20 0755  . senna-docusate (Senokot-S) tablet 1 tablet  1 tablet Oral  BID Rolly Salter, MD   1 tablet at 09/11/20 1047  . sodium chloride 0.9 % bolus 1,000 mL  1,000 mL Intravenous Q2H Rolly Salter, MD         Discharge Medications: Please see discharge summary for a list of discharge medications.  Relevant Imaging Results:  Relevant Lab Results:   Additional Information SSN [id number]  Darleene Cleaver, LCSW

## 2020-09-11 NOTE — Progress Notes (Signed)
OT Cancellation Note  Patient Details Name: Dawn Ho MRN: [id number] DOB: Nov 14, 1950   Cancelled Treatment:    Reason Eval/Treat Not Completed: Patient declined, due to eating lunch. Pt observed struggling to reach items on tray but refused assistance. Noted large spill on floor. Therapist stayed briefly to wipe up spill for safety. While cleaning, pt looked down and became agitated stating, "You spilled that!"  Therapist explained that spill was there when therapist entered, as had been explained to pt. Pt then turned head away and resumed eating.  Will cotinue efforts at a later date.    Theodoro Clock 09/11/2020, 2:21 PM

## 2020-09-11 NOTE — Progress Notes (Addendum)
[facility] Progress Note  Patient: Dawn Ho    OKH:[id number]  DOA: 09/09/2020     Date of Service: the patient was seen and examined on 09/11/2020  Brief hospital course: Past medical history of CVA with residual left-sided weakness, HLD, HTN, type II DM, resident at [facility].Marland Kitchen  Presents with a mechanical fall from a wheelchair with resultant right hip fracture. Orthopedic was consulted.  Patient was transferred to [facility] from [facility] at the request of orthopedics for surgical repair. Currently plan is pain control and monitor postop recovery.  Assessment and Plan: 1.  Right femur fracture Mechanical fall Dr. Ophelia Charter consulted. DVT prophylaxis with Lovenox. NWB for 6 wks due to poor bone quality. Minimize pain medication for pain control. Discontinue tramadol on scheduled basis Currently will hold Plavix as well.  Resuming 1 or 2 days as long as H&H remained stable  2.  History of CVA Residual left-sided weakness. Hyperlipidemia On Plavix.  Currently on hold. Resuming 1 or 2 days as long as H&H remained stable Continue statin.  3. type 2 diabetes mellitus, controlled.  No complication. Lantus dose reduced as the patient is NPO. Continue sliding scale insulin.  Monitor.  4.  Essential hypertension Hypotension Blood pressure soft. Check CBC. Continue with IV fluid bolus as well as IV fluid. Reduce the dose of the pain medication as well as secondary medications.    5.  Chronic diastolic CHF Currently euvolemic. Holding Lasix.  6.  Obesity Placing the patient at high risk for poor outcome.  Monitor. Body mass index is 31.64 kg/m.   7.  Acute blood loss anemia. Hemoglobin dropped from 12 at baseline to 9.8. Transfuse for hemoglobin less than 7. Also if the blood pressure does not improve with normal saline boluses we will transfuse 1 PRBC regardless of hemoglobin.  8.  Acute kidney injury. Renal function elevated after procedure. Likely hemodynamic  injury. We will continue with aggressive hydration and monitor response.  Diet: Regular DVT Prophylaxis:   enoxaparin (LOVENOX) injection 40 mg Start: 09/11/20 0800 SCDs Start: 09/10/20 1509 SCDs Start: 09/09/20 2240   Advance goals of care discussion: Full code  Family Communication: no family was present at bedside, at the time of interview.   Disposition:  Status is: Inpatient  Remains inpatient appropriate because:Ongoing diagnostic testing needed not appropriate for outpatient work up   Dispo: The patient is from: Home              Anticipated d/c is to: SNF              Anticipated d/c date is: 2 days              Patient currently is not medically stable to d/c.   Difficult to place patient No  Subjective: No nausea no vomiting.  No fever no chills.  No pain reported by the patient.  Blood pressure soft in the morning.  Physical Exam:  General: Appear in mild distress, no Rash; Oral Mucosa Clear, moist. no Abnormal Neck Mass Or lumps, Conjunctiva normal  Cardiovascular: S1 and S2 Present, no Murmur, Respiratory: Normal respiratory effort, Bilateral Air entry present and no crackles, no wheezes Abdomen: Bowel Sound present, Soft and no tenderness Extremities: trace Pedal edema Neurology: alert and oriented to time, place, and person affect appropriate. no new focal deficit.  Chronic left hemiparesis Gait not checked due to patient safety concerns    Vitals:   09/11/20 0130 09/11/20 9532 09/11/20 0953 09/11/20 1438  BP: (!) 95/52 (!) 95/51 (!) 90/46 122/63  Pulse: 64 68 65 (!) 104  Resp: 18 18 16 18   Temp: 98.8 F (37.1 C) 99.6 F (37.6 C) 97.6 F (36.4 C) (!) 97.4 F (36.3 C)  TempSrc: Oral Oral Oral Oral  SpO2: 98% 98% 99% 97%  Weight:      Height:        Intake/Output Summary (Last 24 hours) at 09/11/2020 1527 Last data filed at 09/11/2020 1104 Gross per 24 hour  Intake 1490 ml  Output 1000 ml  Net 490 ml   Filed Weights   09/09/20 1147  Weight:  73.5 kg    Data Reviewed: I have personally reviewed and interpreted daily labs, tele strips, imaging. I reviewed all nursing notes, pharmacy notes, vitals, pertinent old records I have discussed plan of care as described above with RN and patient/family.  CBC: Recent Labs  Lab 09/09/20 1603 09/10/20 0545 09/10/20 1406 09/11/20 0529  WBC 12.4* 11.2* 13.6* 12.1*  NEUTROABS 9.8*  --   --   --   HGB 13.2 12.6 12.0 9.8*  HCT 41.3 39.2 38.0 30.1*  MCV 95.8 95.4 97.4 95.3  PLT 170 157 138* 154   Basic Metabolic Panel: Recent Labs  Lab 09/09/20 1603 09/10/20 0545 09/10/20 1406 09/11/20 0529  NA 140 141  --  141  K 3.8 4.0  --  4.7  CL 107 108  --  109  CO2 24 22  --  23  GLUCOSE 161* 143*  --  127*  BUN 31* 25*  --  32*  CREATININE 1.10* 1.03* 1.02* 1.42*  CALCIUM 8.8* 9.0  --  8.8*    Studies: No results found.  Scheduled Meds: . atorvastatin  20 mg Oral q1800  . ciprofloxacin  1 drop Left EAR BID  . enoxaparin (LOVENOX) injection  40 mg Subcutaneous Q24H  . escitalopram  20 mg Oral Daily  . feeding supplement  237 mL Oral BID BM  . gabapentin  300 mg Oral BID  . insulin aspart  0-5 Units Subcutaneous QHS  . insulin aspart  0-9 Units Subcutaneous TID WC  . insulin glargine  14 Units Subcutaneous QHS  . levothyroxine  112 mcg Oral Daily  . lidocaine  1 patch Transdermal Daily  . multivitamin with minerals  1 tablet Oral Daily  . nystatin  1 application Topical BID  . senna-docusate  1 tablet Oral BID   Continuous Infusions: . lactated ringers 50 mL/hr at 09/11/20 0900  . sodium chloride     PRN Meds: acetaminophen, menthol-cetylpyridinium **OR** phenol, morphine injection, nitroGLYCERIN, ondansetron **OR** ondansetron (ZOFRAN) IV, oxyCODONE  Time spent: 35 minutes  Author: 09/13/20, MD [facility] 09/11/2020 3:27 PM  To reach On-call, see care teams to locate the attending and reach out via [email or web address]/13/2020. Between 7PM-7AM, please contact  night-coverage If you still have difficulty reaching the attending provider, please page the [facility] (Director on Call) for [facility] on amion for assistance.

## 2020-09-11 NOTE — Evaluation (Signed)
Physical Therapy Evaluation Patient Details Name: Dawn Ho MRN: [id number] DOB: 16-Jul-1951 Today's Date: 09/11/2020   History of Present Illness  70 yo female with Past medical history of CVA with residual left-sided weakness, HLD, HTN, type II DM, resident at [facility].  Presents with a mechanical fall from a wheelchair with resultant right hip fracture.  Orthopedic was consulted.  Patient was transferred to [facility] from [facility] at the request of orthopedics for surgical repair.  Pt with R IT hip fx and is now s/p IM nail per Dr. Ophelia Charter on 09/10/20.  Clinical Impression  Pt admitted with above diagnosis.  Pt is NWB RLE which was her stronger side prior to fall, L hemiparesis d/t prior CVA.  Difficult to fully determine pt functional baseline as she is not an accurate historian and no family present.  She did fall from w/c level--possibly pt was able to transfer using LEs and R UE prior to fall --? Recommend return to SNF. Will follow in acute setting.  Pt currently with functional limitations due to the deficits listed below (see PT Problem List). Pt will benefit from skilled PT to increase their independence and safety with mobility to allow discharge to the venue listed below.       Follow Up Recommendations SNF    Equipment Recommendations  None recommended by PT    Recommendations for Other Services       Precautions / Restrictions Precautions Precautions: Fall      Mobility  Bed Mobility Overal bed mobility: Needs Assistance Bed Mobility: Supine to Sit;Sit to Supine     Supine to sit: +2 for safety/equipment;+2 for physical assistance;Total assist Sit to supine: +2 for physical assistance;+2 for safety/equipment;Total assist   General bed mobility comments: bed pad utilized to mobilize pt, assist with LEs and trunk in both directions, pt attempts to self assist with R UE/bed rail. pt crying in pain with mobility, asking to be put back to bed    Transfers                     Ambulation/Gait                Stairs            Wheelchair Mobility    Modified Rankin (Stroke Patients Only)       Balance Overall balance assessment: Needs assistance;History of Falls Sitting-balance support: No upper extremity supported;Single extremity supported Sitting balance-Leahy Scale: Poor Sitting balance - Comments: posterior bias, trying to place self back to bed                                     Pertinent Vitals/Pain Pain Assessment: Faces Faces Pain Scale: Hurts whole lot Pain Location: R hip/LE with any movement Pain Descriptors / Indicators: Moaning;Crying;Other (Comment) (screaming) Pain Intervention(s): Limited activity within patient's tolerance;Monitored during session;Repositioned    Home Living Family/patient expects to be discharged to:: Skilled nursing facility                 Additional Comments: Pt is from [facility] per chart review. She is unable to accurately provide info reagrding PLOF    Prior Function Level of Independence: Needs assistance   Gait / Transfers Assistance Needed: unable to determine from pt (fell from w/c level)  ADL's / Homemaking Assistance Needed: per previous admission info--assist with all ADLs from  staff        Hand Dominance        Extremity/Trunk Assessment   Upper Extremity Assessment Upper Extremity Assessment: Defer to OT evaluation    Lower Extremity Assessment Lower Extremity Assessment: RLE deficits/detail;LLE deficits/detail RLE Deficits / Details: resistant to imposed movmement RLE: Unable to fully assess due to pain LLE Deficits / Details: spastic hemiparesis. AAROM grossly WFL       Communication   Communication: Expressive difficulties (gibberish initially when pt asked where she was)  Cognition Arousal/Alertness: Awake/alert Behavior During Therapy: Anxious Overall Cognitive Status: No family/caregiver present to determine  baseline cognitive functioning Area of Impairment: Orientation;Following commands;Problem solving                 Orientation Level: Disoriented to;Place;Time;Situation     Following Commands: Follows one step commands inconsistently;Follows multi-step commands inconsistently     Problem Solving: Slow processing;Difficulty sequencing;Decreased initiation;Requires verbal cues;Requires tactile cues        General Comments      Exercises     Assessment/Plan    PT Assessment Patient needs continued PT services  PT Problem List Decreased strength;Decreased range of motion;Decreased activity tolerance;Decreased balance;Pain;Decreased mobility;Decreased cognition       PT Treatment Interventions Therapeutic activities;Therapeutic exercise;Balance training;Functional mobility training;Patient/family education    PT Goals (Current goals can be found in the Care Plan section)  Acute Rehab PT Goals Patient Stated Goal: none stated, wants to stay in bed PT Goal Formulation: Patient unable to participate in goal setting Time For Goal Achievement: 09/25/20 Potential to Achieve Goals: Poor    Frequency Min 2X/week   Barriers to discharge        Co-evaluation               AM-PAC PT "6 Clicks" Mobility  Outcome Measure Help needed turning from your back to your side while in a flat bed without using bedrails?: Total Help needed moving from lying on your back to sitting on the side of a flat bed without using bedrails?: Total Help needed moving to and from a bed to a chair (including a wheelchair)?: Total Help needed standing up from a chair using your arms (e.g., wheelchair or bedside chair)?: Total Help needed to walk in hospital room?: Total Help needed climbing 3-5 steps with a railing? : Total 6 Click Score: 6    End of Session   Activity Tolerance: Patient tolerated treatment well Patient left: with call bell/phone within reach;in bed;with bed alarm set    PT Visit Diagnosis: History of falling (Z91.81);Other abnormalities of gait and mobility (R26.89);Hemiplegia and hemiparesis Hemiplegia - Right/Left: Right Hemiplegia - dominant/non-dominant: Dominant Hemiplegia - caused by: Cerebral infarction    Time: 8119-1478 PT Time Calculation (min) (ACUTE ONLY): 13 min   Charges:   PT Evaluation $PT Eval Moderate Complexity: 1 Mod          Jodye Scali, PT  Acute Rehab Dept ([facility]) [phone] Pager [phone]  09/11/2020   [facility] 09/11/2020, 1:21 PM

## 2020-09-12 ENCOUNTER — Encounter (HOSPITAL_COMMUNITY): Payer: Self-pay | Admitting: Orthopaedic Surgery

## 2020-09-12 DIAGNOSIS — E0810 Diabetes mellitus due to underlying condition with ketoacidosis without coma: Secondary | ICD-10-CM | POA: Diagnosis not present

## 2020-09-12 DIAGNOSIS — S72001A Fracture of unspecified part of neck of right femur, initial encounter for closed fracture: Secondary | ICD-10-CM | POA: Diagnosis not present

## 2020-09-12 LAB — CBC
HCT: 28.9 % — ABNORMAL LOW (ref 36.0–46.0)
Hemoglobin: 9.2 g/dL — ABNORMAL LOW (ref 12.0–15.0)
MCH: 31.2 pg (ref 26.0–34.0)
MCHC: 31.8 g/dL (ref 30.0–36.0)
MCV: 98 fL (ref 80.0–100.0)
Platelets: 120 10*3/uL — ABNORMAL LOW (ref 150–400)
RBC: 2.95 MIL/uL — ABNORMAL LOW (ref 3.87–5.11)
RDW: 12.7 % (ref 11.5–15.5)
WBC: 11.5 10*3/uL — ABNORMAL HIGH (ref 4.0–10.5)
nRBC: 0 % (ref 0.0–0.2)

## 2020-09-12 LAB — BASIC METABOLIC PANEL
Anion gap: 9 (ref 5–15)
BUN: 33 mg/dL — ABNORMAL HIGH (ref 8–23)
CO2: 24 mmol/L (ref 22–32)
Calcium: 8.5 mg/dL — ABNORMAL LOW (ref 8.9–10.3)
Chloride: 109 mmol/L (ref 98–111)
Creatinine, Ser: 1.32 mg/dL — ABNORMAL HIGH (ref 0.44–1.00)
GFR, Estimated: 43 mL/min — ABNORMAL LOW (ref >60)
Glucose, Bld: 79 mg/dL (ref 70–99)
Potassium: 4.3 mmol/L (ref 3.5–5.1)
Sodium: 142 mmol/L (ref 135–145)

## 2020-09-12 LAB — GLUCOSE, CAPILLARY
Glucose-Capillary: 144 mg/dL — ABNORMAL HIGH (ref 70–99)
Glucose-Capillary: 69 mg/dL — ABNORMAL LOW (ref 70–99)
Glucose-Capillary: 90 mg/dL (ref 70–99)

## 2020-09-12 MED ORDER — ACETAMINOPHEN 325 MG PO TABS: 650.0000 mg | ORAL_TABLET | Freq: Four times a day (QID) | ORAL | Status: DC | PRN

## 2020-09-12 MED ORDER — INSULIN GLARGINE 100 UNIT/ML ~~LOC~~ SOLN: 10.0000 [IU] | Freq: Every day | SUBCUTANEOUS | 11 refills | Status: DC

## 2020-09-12 MED ORDER — INSULIN GLARGINE 100 UNIT/ML ~~LOC~~ SOLN: 10.0000 [IU] | Freq: Every day | SUBCUTANEOUS | Status: DC

## 2020-09-12 MED ORDER — INSULIN ASPART 100 UNIT/ML ~~LOC~~ SOLN: 0.0000 [IU] | Freq: Three times a day (TID) | SUBCUTANEOUS | 11 refills | Status: DC

## 2020-09-12 MED ORDER — OXYCODONE-ACETAMINOPHEN 5-325 MG PO TABS
1.0000 | ORAL_TABLET | Freq: Four times a day (QID) | ORAL | Status: DC | PRN
  Administered 2020-09-12: 1 via ORAL
  Filled 2020-09-12: qty 1

## 2020-09-12 MED ORDER — INSULIN ASPART 100 UNIT/ML ~~LOC~~ SOLN: 0.0000 [IU] | Freq: Every day | SUBCUTANEOUS | 11 refills | Status: DC

## 2020-09-12 NOTE — TOC Transition Note (Addendum)
Transition of Care [facility]) - CM/SW Discharge Note   Patient Details  Name: Dawn Ho MRN: [id number] Date of Birth: 1951-01-30  [facility] of [facility]) CM/SW Contact:  Clearance Coots, LCSW Phone Number: 09/12/2020, 10:38 AM   Clinical Narrative:    CSW confirmed w/ Admission Coordinator Melissa the SNF is ready to receive the patient. Per Efraim Kaufmann, the patient will return to SNF under her Medicaid and the facility will initiate authorization with [employer] for physical therapy.  Covid test within 72 hour SNF requirement  Nurse call report to: [phone] PT[facility] to transport.  [place] notified.   Final next level of care: Skilled Nursing Facility Barriers to Discharge: Barriers Resolved   Patient Goals and CMS Choice Patient states their goals for this hospitalization and ongoing recovery are:: To return back to [facility] CMS Medicare.gov Compare Post Acute Care list provided to:: Patient Represenative (must comment) Choice offered to / list presented to : Adult Children  Discharge Placement   Existing PASRR number confirmed : 09/11/20          Patient chooses bed at: [facility] Patient to be transferred to facility by: PTAR Name of family member notified: Son Genevie Cheshire Patient and family notified of of transfer: 09/12/20  Discharge Plan and Services In-house Referral: Clinical Social Work   Post Acute Care Choice: Skilled Nursing Facility                               Social Determinants of Health (SDOH) Interventions     Readmission Risk Interventions No flowsheet data found.

## 2020-09-12 NOTE — Progress Notes (Signed)
Inpatient Diabetes Program Recommendations  AACE/ADA: New Consensus Statement on Inpatient Glycemic Control (2015)  Target Ranges:  Prepandial:   less than 140 mg/dL      Peak postprandial:   less than 180 mg/dL (1-2 hours)      Critically ill patients:  140 - 180 mg/dL   Results for ZULEICA, SEITH (MRN [id number]) as of 09/12/2020 09:22  Ref. Range 09/12/2020 07:49  Glucose-Capillary Latest Ref Range: 70 - 99 mg/dL 69 (L)    Home DM Meds: Lantus 22 units QHS        Humalog 8 units BID   Current Orders: Lantus 14 units QHS       Novolog 0-9 units ac/hs.       Mild HYPO this AM (CBG 69) after getting 14 units Lantus last PM.   MD- Please consider reducing Lantus to 10 units QHS     --Will follow patient during hospitalization--  Ambrose Finland RN, MSN, CDE Diabetes Coordinator Inpatient Glycemic Control Team Team Pager: [phone] (8a-5p)

## 2020-09-12 NOTE — Plan of Care (Signed)
Pt transferred back to SNF via EMS, written instructions inside d/c packet

## 2020-09-12 NOTE — Care Management Important Message (Signed)
Medicare important message printed for Dawn Sinclair, LCSW to give to the patient. 

## 2020-09-12 NOTE — Progress Notes (Addendum)
   Subjective: 2 Days Post-Op Procedure(s) (LRB): im nail (Right) Patient reports pain as mild.  Confused, difficult to assess.   Objective: Vital signs in last 24 hours: Temp:  [97.4 F (36.3 C)-99.7 F (37.6 C)] 97.6 F (36.4 C) (01/24 0938) Pulse Rate:  [88-105] 88 (01/24 0938) Resp:  [17-18] 18 (01/24 0938) BP: (122-150)/(57-78) 150/74 (01/24 0938) SpO2:  [91 %-100 %] 96 % (01/24 0938)  Intake/Output from previous day: 01/23 0701 - 01/24 0700 In: 3540 [P.O.:1590; I.V.:700; IV Piggyback:1250] Out: 1800 [Urine:1800] Intake/Output this shift: No intake/output data recorded.  Recent Labs    09/10/20 0545 09/10/20 1406 09/11/20 0529 09/11/20 1701 09/12/20 0708  HGB 12.6 12.0 9.8* 10.3* 9.2*   Recent Labs    09/11/20 1701 09/12/20 0708  WBC 10.4 11.5*  RBC 3.33* 2.95*  HCT 31.6* 28.9*  PLT 116* 120*   Recent Labs    09/11/20 0529 09/12/20 0708  NA 141 142  K 4.7 4.3  CL 109 109  CO2 23 24  BUN 32* 33*  CREATININE 1.42* 1.32*  GLUCOSE 127* 79  CALCIUM 8.8* 8.5*   Recent Labs    09/09/20 1603  INR 0.9    Compartment soft dressing dry.  No results found.  Assessment/Plan: 2 Days Post-Op Procedure(s) (LRB): im nail (Right) Plan:   Could do transfer to recliner . Right side functioning but she has right hip fx so may need [place] to get to recliner.  Office followup 2 wks.  Norco Rx on chart  For SNF.  NWB times 6 wks . ASA 325mg  one po daily for 4 wks for DVT prophylaxis then resume ASA 81 mg after 4 wks. 09/12/2020, 10:05 AM

## 2020-09-12 NOTE — Progress Notes (Signed)
Report called to [facility] at [facility], written d/c instructions and Rx packet given to EMS. Pt transported via EMS back to facility, pain pill given prior to transport.

## 2020-09-12 NOTE — Discharge Summary (Signed)
Physician Discharge Summary  Dawn Ho:[id number] DOB: 03-30-1951 DOA: 09/09/2020  PCP: Bernerd Limbo, MD  Admit date: 09/09/2020 Discharge date: 09/12/2020  Admitted From: SNF Discharge disposition: Back to SNF   Code Status: Full Code  Diet Recommendation: Cardiac/diabetic diet  Discharge Diagnosis:   Active Problems:   Fibromyalgia   Diabetes mellitus (HCC)   Depression with anxiety   Cerebral infarction (HCC)   Spastic hemiparesis (HCC)   Closed right hip fracture, initial encounter (HCC)   History of Present Illness / Brief narrative:  Patient is a 70 year old Caucasian female with past medical history of CVA with residual left-sided weakness, HLD, HTN, type II DM, resident at [facility]..   Patient presented to ED on 1/21 after a mechanical fall from a wheelchair with resultant right hip fracture. Admitted to hospitalist service. Orthopedics consulted. Underwent right short trochanteric nail placement by Dr. Ophelia Charter on 1/22  Subjective:  Seen and examined this morning. Not in distress.  Pain controlled.  Hospital Course:  Right femur fracture -Imaging: Right hip fracture -Treatment: 1/22, right short trochanteric nail placement -Pain management: Percocet every 6 as needed for moderate pain, Tylenol every 6 as needed for mild pain -DVT prophylaxis: Aspirin 325 mg daily  -BM: Senokot scheduled, MiraLAX as needed -Orthopedics recommended nonweightbearing for 6 weeks due to poor bone quality  Postoperative hemoglobin drop -Hemoglobin normal at baseline, 9.2 this morning.  No active bleeding. -Acute drop due to blood loss from fracture and surgery. -No need of transfusion this admission. Recent Labs    09/10/20 0545 09/10/20 1406 09/11/20 0529 09/11/20 1701 09/12/20 0708  HGB 12.6 12.0 9.8* 10.3* 9.2*  MCV 95.4 97.4 95.3 94.9 98.0   History of CVA Residual left-sided weakness. Hyperlipidemia -prior to admission, patient was on aspirin 81 mg  daily, Plavix 75 mg daily and Lipitor 20 mg daily.  -Currently she is on DVT prophylaxis with higher dose of aspirin at 325 mg daily for 30 days.  After that, aspirin 81 mg daily to be resumed.  Continue Plavix and statin.  type 2 diabetes mellitus, controlled.  Hypoglycemia -A1c 5.7 on 1/21.   -Blood sugar level was low at 69 this morning.  Lantus reduced to 10 units daily today.  Continue sliding scale with Accu-Cheks.   Recent Labs  Lab 09/11/20 1152 09/11/20 1714 09/11/20 2052 09/12/20 0749 09/12/20 0837  GLUCAP 109* 121* 113* 69* 90   Essential hypertension Chronic diastolic CHF -Blood pressure stable. CHF remains compensated.  -Okay to resume metoprolol and Lasix at discharge.  Losartan held.  Okay to resume if blood pressure remains elevated over 140 systolic  Obesity - Body mass index is 31.64 kg/m. Patient has been advised to make an attempt to improve diet and exercise patterns to aid in weight loss.  Wound care: Incision (Closed) 09/10/20 Hip (Active)  Date First Assessed/Time First Assessed: 09/10/20 1310   Location: Hip    Assessments 09/10/2020  1:26 PM 09/11/2020  8:59 PM  Dressing Type - Foam - Lift dressing to assess site every shift  Dressing Clean;Dry;Intact Dry;Intact;Clean  Site / Wound Assessment - Dressing in place / Unable to assess  Drainage Amount None None     No Linked orders to display    Discharge Exam:   Vitals:   09/11/20 2048 09/12/20 0525 09/12/20 0800 09/12/20 0938  BP: 124/62 (!) 129/57 125/67 (!) 150/74  Pulse: (!) 105 94 94 88  Resp: 18 17  18   Temp: 98.8 F (37.1 C)  99.7 F (37.6 C) 98.2 F (36.8 C) 97.6 F (36.4 C)  TempSrc: Oral Oral Oral Oral  SpO2: 91% 94% 100% 96%  Weight:      Height:        Body mass index is 31.64 kg/m.  General exam: Pleasant, elderly Caucasian female.  Not in physical distress Skin: No rashes, lesions or ulcers. HEENT: Atraumatic, normocephalic, no obvious bleeding Lungs: Clear to auscultation  bilaterally CVS: Regular rate and rhythm, no murmur GI/Abd soft, nontender, nondistended, bowel sound present CNS: Alert, awake, oriented to place.  Baseline left hemiparesis Psychiatry: Mood appropriate Extremities: No pedal edema, no calf tenderness  Follow ups:   Discharge Instructions    Diet - low sodium heart healthy   Complete by: As directed    Diet Carb Modified   Complete by: As directed    Discharge wound care:   Complete by: As directed    Reinforce dressing daily   Increase activity slowly   Complete by: As directed    Non weight bearing   Complete by: As directed    Times 6 wks   Laterality: right   Extremity: Lower      Follow-up Information    Eldred Manges, MD Follow up in 2 week(s).   Specialty: Orthopedic Surgery Contact information: [address] [place] [address] [phone]        Bernerd Limbo, MD Follow up.   Specialty: Internal Medicine Contact information: [address] [place] [address] [phone]               Recommendations for Outpatient Follow-Up:   1. Follow-up with orthopedics as an outpatient  Discharge Instructions:  Follow with Primary MD Bernerd Limbo, MD in 7 days   Get CBC/BMP checked in next visit within 1 week by PCP or SNF MD ( we routinely change or add medications that can affect your baseline labs and fluid status, therefore we recommend that you get the mentioned basic workup next visit with your PCP, your PCP may decide not to get them or add new tests based on their clinical decision)  On your next visit with your PCP, please Get Medicines reviewed and adjusted.  Please request your PCP  to go over all Hospital Tests and Procedure/Radiological results at the follow up, please get all Hospital records sent to your Prim MD by signing hospital release before you go home.  Activity: As tolerated with Full fall precautions use walker/cane & assistance as needed  For Heart  failure patients - Check your Weight same time everyday, if you gain over 2 pounds, or you develop in leg swelling, experience more shortness of breath or chest pain, call your Primary MD immediately. Follow Cardiac Low Salt Diet and 1.5 lit/day fluid restriction.  If you have smoked or chewed Tobacco in the last 2 yrs please stop smoking, stop any regular Alcohol  and or any Recreational drug use.  If you experience worsening of your admission symptoms, develop shortness of breath, life threatening emergency, suicidal or homicidal thoughts you must seek medical attention immediately by calling 911 or calling your MD immediately  if symptoms less severe.  You Must read complete instructions/literature along with all the possible adverse reactions/side effects for all the Medicines you take and that have been prescribed to you. Take any new Medicines after you have completely understood and accpet all the possible adverse reactions/side effects.   Do not drive, operate heavy machinery, perform activities at heights,  swimming or participation in water activities or provide baby sitting services if your were admitted for syncope or siezures until you have seen by Primary MD or a Neurologist and advised to do so again.  Do not drive when taking Pain medications.  Do not take more than prescribed Pain, Sleep and Anxiety Medications  Wear Seat belts while driving.   Please note You were cared for by a hospitalist during your hospital stay. If you have any questions about your discharge medications or the care you received while you were in the hospital after you are discharged, you can call the unit and asked to speak with the hospitalist on call if the hospitalist that took care of you is not available. Once you are discharged, your primary care physician will handle any further medical issues. Please note that NO REFILLS for any discharge medications will be authorized once you are discharged, as it is  imperative that you return to your primary care physician (or establish a relationship with a primary care physician if you do not have one) for your aftercare needs so that they can reassess your need for medications and monitor your lab values.    Allergies as of 09/12/2020      Reactions   Ivp Dye [iodinated Diagnostic Agents]    Per pt: swelling with first contrast xray in 1969. Second contrast xray in the 90s without symptoms:  Patient has been pre-medicated for CT scans in 2016. Needs to have pre-medications before any scans with IV contrast. / TSF 08/30/16   Penicillins Swelling, Rash      Medication List    STOP taking these medications   aspirin EC 81 MG tablet Replaced by: aspirin 325 MG tablet   insulin lispro 100 UNIT/ML injection Commonly known as: HUMALOG   insulin regular 100 units/mL injection Commonly known as: NOVOLIN R   losartan 100 MG tablet Commonly known as: COZAAR   oxyCODONE 5 MG immediate release tablet Commonly known as: Oxy IR/ROXICODONE   oxyCODONE-acetaminophen 5-325 MG tablet Commonly known as: PERCOCET/ROXICET   potassium chloride 10 MEQ tablet Commonly known as: KLOR-CON     TAKE these medications   acetaminophen 650 MG CR tablet Commonly known as: TYLENOL Take 650 mg by mouth every 8 (eight) hours as needed for pain.   acetaminophen 325 MG tablet Commonly known as: TYLENOL Take 650 mg by mouth every 6 (six) hours as needed.   aspirin 325 MG tablet Commonly known as: Bayer Aspirin Take 1 tablet (325 mg total) by mouth daily. Take one daily for 30 days then stop and resume baby aspirin Replaces: aspirin EC 81 MG tablet   atorvastatin 20 MG tablet Commonly known as: LIPITOR Take 1 tablet (20 mg total) by mouth daily at 6 PM.   Ciprofloxacin HCl 0.2 % otic solution Place 0.4 mLs into the left ear 2 (two) times daily.   clopidogrel 75 MG tablet Commonly known as: PLAVIX Take 1 tablet (75 mg total) by mouth daily.    clotrimazole-betamethasone cream Commonly known as: LOTRISONE Apply topically.   cyanocobalamin 1000 MCG/ML injection Commonly known as: (VITAMIN B-12) Inject 1 mL (1,000 mcg total) into the muscle every 30 (thirty) days.   escitalopram 10 MG tablet Commonly known as: LEXAPRO Take 20 mg by mouth daily.   esomeprazole 40 MG packet Commonly known as: NEXIUM Take 40 mg by mouth daily before breakfast.   famotidine 20 MG tablet Commonly known as: PEPCID   feeding supplement (GLUCERNA SHAKE) Liqd Take  237 mLs by mouth 3 (three) times daily between meals.   feeding supplement (PRO-STAT SUGAR FREE 64) Liqd Take 30 mLs by mouth daily.   furosemide 20 MG tablet Commonly known as: LASIX Take 60 mg by mouth daily.   gabapentin 300 MG capsule Commonly known as: NEURONTIN Take 1 capsule (300 mg total) by mouth at bedtime.   gabapentin 600 MG tablet Commonly known as: NEURONTIN Take 600 mg by mouth daily.   HYDROcodone-acetaminophen 5-325 MG tablet Commonly known as: NORCO/VICODIN Take 1-2 tablets by mouth every 6 (six) hours as needed for moderate pain. Post op hip fracture pain What changed:   how much to take  when to take this  reasons to take this  additional instructions   insulin aspart 100 UNIT/ML injection Commonly known as: novoLOG Inject 0-5 Units into the skin at bedtime.   insulin aspart 100 UNIT/ML injection Commonly known as: novoLOG Inject 0-9 Units into the skin 3 (three) times daily with meals.   insulin glargine 100 UNIT/ML injection Commonly known as: LANTUS Inject 0.1 mLs (10 Units total) into the skin at bedtime. What changed: how much to take   levothyroxine 112 MCG tablet Commonly known as: SYNTHROID Take 112 mcg by mouth daily.   lidocaine 5 % Commonly known as: LIDODERM 1 patch daily.   LORazepam 0.5 MG tablet Commonly known as: ATIVAN Take one tablet by mouth twice daily for anxiety What changed:   how much to take  how to  take this  when to take this  additional instructions   metoprolol tartrate 50 MG tablet Commonly known as: LOPRESSOR Take 50 mg by mouth in the morning. Patient takes 50mg  in the morning and 25mg  in the evening   multivitamin with minerals tablet Take 1 tablet by mouth daily.   nitroGLYCERIN 0.4 MG SL tablet Commonly known as: NITROSTAT Place 0.4 mg under the tongue every 5 (five) minutes as needed for chest pain.   Nyamyc powder Generic drug: nystatin Apply 1 application topically 2 (two) times daily.   potassium chloride SA 20 MEQ tablet Commonly known as: KLOR-CON Take 20 mEq by mouth daily.   sennosides-docusate sodium 8.6-50 MG tablet Commonly known as: SENOKOT-S Take 1 tablet by mouth daily.   sertraline 100 MG tablet Commonly known as: ZOLOFT Take 100 mg by mouth daily.            Discharge Care Instructions  (From admission, onward)         Start     Ordered   09/12/20 0000  Discharge wound care:       Comments: Reinforce dressing daily   09/12/20 1021   09/11/20 0000  Non weight bearing       Comments: Times 6 wks  Question Answer Comment  Laterality right   Extremity Lower      09/11/20 1029          Time coordinating discharge: 35 minutes  The results of significant diagnostics from this hospitalization (including imaging, microbiology, ancillary and laboratory) are listed below for reference.    Procedures and Diagnostic Studies:   DG Chest Portable 1 View  Result Date: 09/09/2020 CLINICAL DATA:  Fall, hip fracture EXAM: PORTABLE CHEST 1 VIEW COMPARISON:  Portable exam 1555 hours compared to 11/06/2017 FINDINGS: Normal heart size, mediastinal contours, and pulmonary vascularity. Loop recorder projects over chest. Atherosclerotic calcification aorta. Lungs clear. No pleural effusion or pneumothorax. Diffuse osseous demineralization. IMPRESSION: No acute abnormalities. Aortic Atherosclerosis (ICD10-I70.0). Electronically Signed  By: Ulyses Southward M.D.   On: 09/09/2020 16:06   DG C-Arm 1-60 Min-No Report  Result Date: 09/10/2020 Fluoroscopy was utilized by the requesting physician.  No radiographic interpretation.   DG HIP OPERATIVE UNILAT WITH PELVIS RIGHT  Result Date: 09/10/2020 CLINICAL DATA:  Right hip IM nail EXAM: OPERATIVE RIGHT HIP (WITH PELVIS IF PERFORMED) 3 VIEWS TECHNIQUE: Fluoroscopic spot image(s) were submitted for interpretation post-operatively. FLUOROSCOPY TIME:  24 seconds 9.2 mGy COMPARISON:  Right hip radiographs dated 09/09/2020 FINDINGS: Intraoperative fluoroscopic images during IM nail with dynamic hip screw fixation of a right hip fracture. Fracture fragments are in near anatomic alignment and position. IMPRESSION: Intraoperative fluoroscopic images during ORIF of a right hip fracture, as above. Electronically Signed   By: Charline Bills M.D.   On: 09/10/2020 14:24   DG Hip Unilat With Pelvis 2-3 Views Right  Result Date: 09/09/2020 CLINICAL DATA:  Larey Seat, right hip pain EXAM: DG HIP (WITH OR WITHOUT PELVIS) 2-3V RIGHT COMPARISON:  None. FINDINGS: Frontal view of the pelvis as well as frontal and frogleg lateral views of the right hip are obtained. Evaluation is limited by body habitus and technique. There is a minimally displaced basicervical right femoral neck fracture with near anatomic alignment. No dislocation. Joint spaces are well preserved. Sclerotic focus left iliac crest likely bone island. IMPRESSION: 1. Minimally displaced basicervical right femoral neck fracture. Near anatomic alignment. Electronically Signed   By: Sharlet Salina M.D.   On: 09/09/2020 14:56     Labs:   Basic Metabolic Panel: Recent Labs  Lab 09/09/20 1603 09/10/20 0545 09/10/20 1406 09/11/20 0529 09/12/20 0708  NA 140 141  --  141 142  K 3.8 4.0  --  4.7 4.3  CL 107 108  --  109 109  CO2 24 22  --  23 24  GLUCOSE 161* 143*  --  127* 79  BUN 31* 25*  --  32* 33*  CREATININE 1.10* 1.03* 1.02* 1.42* 1.32*  CALCIUM  8.8* 9.0  --  8.8* 8.5*   GFR Estimated Creatinine Clearance: 35.5 mL/min (A) (by C-G formula based on SCr of 1.32 mg/dL (H)). Liver Function Tests: No results for input(s): AST, ALT, ALKPHOS, BILITOT, PROT, ALBUMIN in the last 168 hours. No results for input(s): LIPASE, AMYLASE in the last 168 hours. No results for input(s): AMMONIA in the last 168 hours. Coagulation profile Recent Labs  Lab 09/09/20 1603  INR 0.9    CBC: Recent Labs  Lab 09/09/20 1603 09/10/20 0545 09/10/20 1406 09/11/20 0529 09/11/20 1701 09/12/20 0708  WBC 12.4* 11.2* 13.6* 12.1* 10.4 11.5*  NEUTROABS 9.8*  --   --   --   --   --   HGB 13.2 12.6 12.0 9.8* 10.3* 9.2*  HCT 41.3 39.2 38.0 30.1* 31.6* 28.9*  MCV 95.8 95.4 97.4 95.3 94.9 98.0  PLT 170 157 138* 154 116* 120*   Cardiac Enzymes: No results for input(s): CKTOTAL, CKMB, CKMBINDEX, TROPONINI in the last 168 hours. BNP: Invalid input(s): POCBNP CBG: Recent Labs  Lab 09/11/20 1152 09/11/20 1714 09/11/20 2052 09/12/20 0749 09/12/20 0837  GLUCAP 109* 121* 113* 69* 90   D-Dimer No results for input(s): DDIMER in the last 72 hours. Hgb A1c Recent Labs    09/09/20 1603  HGBA1C 5.7*   Lipid Profile No results for input(s): CHOL, HDL, LDLCALC, TRIG, CHOLHDL, LDLDIRECT in the last 72 hours. Thyroid function studies No results for input(s): TSH, T4TOTAL, T3FREE, THYROIDAB in the last 72 hours.  Invalid input(s): FREET3 Anemia work up No results for input(s): VITAMINB12, FOLATE, FERRITIN, TIBC, IRON, RETICCTPCT in the last 72 hours. Microbiology Recent Results (from the past 240 hour(s))  SARS CORONAVIRUS 2 (TAT 6-24 HRS) Nasopharyngeal Nasopharyngeal Swab     Status: None   Collection Time: 09/09/20  5:15 PM   Specimen: Nasopharyngeal Swab  Result Value Ref Range Status   SARS Coronavirus 2 NEGATIVE NEGATIVE Final    Comment: (NOTE) SARS-CoV-2 target nucleic acids are NOT DETECTED.  The SARS-CoV-2 RNA is generally detectable in  upper and lower respiratory specimens during the acute phase of infection. Negative results do not preclude SARS-CoV-2 infection, do not rule out co-infections with other pathogens, and should not be used as the sole basis for treatment or other patient management decisions. Negative results must be combined with clinical observations, patient history, and epidemiological information. The expected result is Negative.  Fact Sheet for Patients: HairSlick.no  Fact Sheet for Healthcare Providers: quierodirigir.com  This test is not yet approved or cleared by the [place] FDA and  has been authorized for detection and/or diagnosis of SARS-CoV-2 by FDA under an Emergency Use Authorization (EUA). This EUA will remain  in effect (meaning this test can be used) for the duration of the COVID-19 declaration under Se ction 564(b)(1) of the Act, 21 U.S.C. section 360bbb-3(b)(1), unless the authorization is terminated or revoked sooner.  Performed at [facility], [address], [place], [place] [address]   SARS CORONAVIRUS 2 (TAT 6-24 HRS) Nasopharyngeal Nasopharyngeal Swab     Status: None   Collection Time: 09/09/20  9:52 PM   Specimen: Nasopharyngeal Swab  Result Value Ref Range Status   SARS Coronavirus 2 NEGATIVE NEGATIVE Final    Comment: (NOTE) SARS-CoV-2 target nucleic acids are NOT DETECTED.  The SARS-CoV-2 RNA is generally detectable in upper and lower respiratory specimens during the acute phase of infection. Negative results do not preclude SARS-CoV-2 infection, do not rule out co-infections with other pathogens, and should not be used as the sole basis for treatment or other patient management decisions. Negative results must be combined with clinical observations, patient history, and epidemiological information. The expected result is Negative.  Fact Sheet for  Patients: HairSlick.no  Fact Sheet for Healthcare Providers: quierodirigir.com  This test is not yet approved or cleared by the [place] FDA and  has been authorized for detection and/or diagnosis of SARS-CoV-2 by FDA under an Emergency Use Authorization (EUA). This EUA will remain  in effect (meaning this test can be used) for the duration of the COVID-19 declaration under Se ction 564(b)(1) of the Act, 21 U.S.C. section 360bbb-3(b)(1), unless the authorization is terminated or revoked sooner.  Performed at [facility], [address], [place], [place] [address]   MRSA PCR Screening     Status: None   Collection Time: 09/09/20 11:36 PM   Specimen: Nasal Mucosa; Nasopharyngeal  Result Value Ref Range Status   MRSA by PCR NEGATIVE NEGATIVE Final    Comment:        The GeneXpert MRSA Assay (FDA approved for NASAL specimens only), is one component of a comprehensive MRSA colonization surveillance program. It is not intended to diagnose MRSA infection nor to guide or monitor treatment for MRSA infections. Performed at [facility], [address], [place], [place] [address]      Signed: Melina Schools Coralyn Roselli  [facility] 09/12/2020, 10:21 AM

## 2020-09-12 NOTE — Progress Notes (Signed)
OT Cancellation Note  Patient Details Name: Dawn Ho MRN: [id number] DOB: 1950-10-19   Cancelled Treatment:    Reason Eval/Treat Not Completed: Other (comment). Pt to D/C back to SNF today, will defer OT eval to that facility.  Ignacia Palma, OTR/L Acute Rehab Services Pager [phone] Office [phone]     Evette Georges 09/12/2020, 11:24 AM

## 2020-09-22 ENCOUNTER — Ambulatory Visit: Payer: Self-pay

## 2020-09-22 ENCOUNTER — Ambulatory Visit (INDEPENDENT_AMBULATORY_CARE_PROVIDER_SITE_OTHER): Payer: Medicare Other | Admitting: Surgery

## 2020-09-22 ENCOUNTER — Encounter: Payer: Self-pay | Admitting: Surgery

## 2020-09-22 VITALS — BP 117/79 | HR 66 | Temp 98.8°F

## 2020-09-22 DIAGNOSIS — M25551 Pain in right hip: Secondary | ICD-10-CM

## 2020-09-22 NOTE — Progress Notes (Signed)
Post-Op Visit Note   Patient: Dawn Ho           Date of Birth: 09/28/1950           MRN: [id number] Visit Date: 09/22/2020 PCP: Bernerd Limbo, MD   Assessment & Plan:  Chief Complaint:  Chief Complaint  Patient presents with  . Right Hip - Routine Post Op  70 year old white female returns.  She is status post ORIF right hip September 10, 2020. Visit Diagnoses:  1. Pain in right hip     Plan: We will have patient follow-up in 1 week for wound check and possible staple removal.  Follow-Up Instructions: Return in about 1 week (around 09/29/2020) for with dr yates for wound check. .   Orders:  Orders Placed This Encounter  Procedures  . XR HIP UNILAT W OR W/O PELVIS 2-3 VIEWS RIGHT   No orders of the defined types were placed in this encounter.   Imaging: No results found.  PMFS History: Patient Active Problem List   Diagnosis Date Noted  . Closed right hip fracture, initial encounter (HCC) 09/09/2020  . Hypomagnesemia 11/12/2017  . Aspiration into airway   . Acute respiratory distress   . Lung crackles   . Ileus (HCC)   . Abdominal distension   . Leukocytosis   . Diarrhea   . UTI (urinary tract infection) 10/31/2017  . Hypokalemia 10/31/2017  . 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 Spastic hemiparesis (HCC) 01/12/2015  . HLD (hyperlipidemia)   . History of recent stroke   . Accelerated hypertension   . Acute cystitis without hematuria   . Tachycardia   . Stroke (HCC) 11/17/2014  . Left-sided weakness 11/17/2014  . 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 CVA (cerebral vascular accident) (HCC) 10/14/2014  . Cerebral infarction (HCC)   . TIA (transient ischemic attack) 10/13/2014  . Chest pain 08/07/2011  . Fibromyalgia 08/07/2011  . Costochondritis 08/07/2011  . Diabetes mellitus (HCC) 08/07/2011  . Depression with anxiety 08/07/2011  . Obesity 08/07/2011  . History of TIAs 08/07/2011  . Hyperlipidemia 08/07/2011    Past Medical History:  Diagnosis Date  . Cataract, left eye   . CVA (cerebral infarction)   . Depression with anxiety   . Diabetes mellitus   . Dyslipidemia   . Fibromyalgia   . Hypertension   . Retinopathy   . Stroke (HCC)    L. hemiparesis  . TIA (transient ischemic attack)     Family History  Problem Relation Age of Onset  . Heart disease Father   . Hypertension Sister   . Colon cancer Neg Hx   . Colon polyps Neg Hx     Past Surgical History:  Procedure Laterality Date  . ABDOMINAL HYSTERECTOMY     age 45, "removed one ovary- excessive bleeding"  . CESAREAN SECTION    . CHOLECYSTECTOMY    . LOOP RECORDER IMPLANT     Dr. Johney Frame placed 10/15/2014  . ORIF HIP FRACTURE Right 09/10/2020   Procedure: im nail;  Surgeon: Eldred Manges, MD;  Location: [facility];  Service: Orthopedics;  Laterality: Right;  . TEE WITHOUT CARDIOVERSION N/A 10/15/2014   Procedure: TRANSESOPHAGEAL ECHOCARDIOGRAM (TEE);  Surgeon: Pricilla Riffle, MD;  Location: [facility] ENDOSCOPY;  Service: Cardiovascular;  Laterality: N/A;   Social History   Occupational History  . Occupation: retired    Comment: Agricultural engineer   Tobacco Use  . Smoking status: Never Smoker  .  Smokeless tobacco: Never Used  Substance and Sexual Activity  . Alcohol use: No  . Drug use: No  . Sexual activity: Not Currently   Exam Right hip wound looks good.  Staples intact.  No drainage or signs infection.

## 2020-09-29 ENCOUNTER — Ambulatory Visit (INDEPENDENT_AMBULATORY_CARE_PROVIDER_SITE_OTHER): Payer: Medicare Other | Admitting: Surgery

## 2020-09-29 ENCOUNTER — Encounter: Payer: Self-pay | Admitting: Surgery

## 2020-09-29 VITALS — Ht 60.0 in | Wt 162.0 lb

## 2020-09-29 DIAGNOSIS — S72001A Fracture of unspecified part of neck of right femur, initial encounter for closed fracture: Secondary | ICD-10-CM

## 2020-09-29 NOTE — Progress Notes (Signed)
70 year old white female who is status post ORIF right hip 09/10/2020 returns for wound check and possible staple removal.  She is doing well.   Exam Wound looks good.  Remaining staples removed.  No signs of infection.  There is a small area proximal incision with the skin edges had maybe about a 1 mm separation.  Slight bleeding in this area also and staples were removed.  I applied benzoin and Steri-Strips to make sure that the wound was well approximated   Plan Patient to follow-up with Dr. Ophelia Charter Friday, February 18th for wound check.  Marland Kitchen

## 2020-10-07 ENCOUNTER — Encounter: Payer: Self-pay | Admitting: Orthopaedic Surgery

## 2020-10-07 ENCOUNTER — Ambulatory Visit (INDEPENDENT_AMBULATORY_CARE_PROVIDER_SITE_OTHER): Payer: Medicare Other | Admitting: Orthopaedic Surgery

## 2020-10-07 VITALS — BP 155/79 | HR 155

## 2020-10-07 DIAGNOSIS — S72001A Fracture of unspecified part of neck of right femur, initial encounter for closed fracture: Secondary | ICD-10-CM

## 2020-10-07 NOTE — Progress Notes (Signed)
Post-Op Visit Note   Patient: Dawn Ho           Date of Birth: 14-Nov-1950           MRN: [id number] Visit Date: 10/07/2020 PCP: Bernerd Limbo, MD   Assessment & Plan: Follow-up right IT hip fracture.  Hip incision is healed no drainage.  She is nonweightbearing and has not walked in years she was injured when she fell out of her wheelchair when he tried to get up.  She can return on as-needed basis.  Today I reviewed with her previous x-rays at her last visit.  Chief Complaint:  Chief Complaint  Patient presents with  . Right Hip - Pain   Visit Diagnoses:  1. Closed right hip fracture, initial encounter [facility])     Plan: Return as needed patient is nonambulator.  Previous x-rays showed excellent position alignment.  No cellulitis and hip incision is healed without drainage.  Follow-Up Instructions: No follow-ups on file.   Orders:  No orders of the defined types were placed in this encounter.  No orders of the defined types were placed in this encounter.   Imaging: No results found.  PMFS History: Patient Active Problem List   Diagnosis Date Noted  . Closed right hip fracture, initial encounter (HCC) 09/09/2020  . Hypomagnesemia 11/12/2017  . Aspiration into airway   . Acute respiratory distress   . Lung crackles   . Ileus (HCC)   . Abdominal distension   . Leukocytosis   . Diarrhea   . UTI (urinary tract infection) 10/31/2017  . Hypokalemia 10/31/2017  . 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 Spastic hemiparesis (HCC) 01/12/2015  . HLD (hyperlipidemia)   . History of recent stroke   . Accelerated hypertension   . Acute cystitis without hematuria   . Tachycardia   . Stroke (HCC) 11/17/2014  . Left-sided weakness 11/17/2014  . 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 CVA (cerebral vascular accident) (HCC) 10/14/2014  . Cerebral infarction (HCC)   . TIA (transient ischemic attack) 10/13/2014  . Chest pain 08/07/2011  .  Fibromyalgia 08/07/2011  . Costochondritis 08/07/2011  . Diabetes mellitus (HCC) 08/07/2011  . Depression with anxiety 08/07/2011  . Obesity 08/07/2011  . History of TIAs 08/07/2011  . Hyperlipidemia 08/07/2011   Past Medical History:  Diagnosis Date  . Cataract, left eye   . CVA (cerebral infarction)   . Depression with anxiety   . Diabetes mellitus   . Dyslipidemia   . Fibromyalgia   . Hypertension   . Retinopathy   . Stroke (HCC)    L. hemiparesis  . TIA (transient ischemic attack)     Family History  Problem Relation Age of Onset  . Heart disease Father   . Hypertension Sister   . Colon cancer Neg Hx   . Colon polyps Neg Hx     Past Surgical History:  Procedure Laterality Date  . ABDOMINAL HYSTERECTOMY     age 17, "removed one ovary- excessive bleeding"  . CESAREAN SECTION    . CHOLECYSTECTOMY    . LOOP RECORDER IMPLANT     Dr. Johney Frame placed 10/15/2014  . ORIF HIP FRACTURE Right 09/10/2020   Procedure: im nail;  Surgeon: Eldred Manges, MD;  Location: [facility];  Service: Orthopedics;  Laterality: Right;  . TEE WITHOUT CARDIOVERSION N/A 10/15/2014   Procedure: TRANSESOPHAGEAL ECHOCARDIOGRAM (TEE);  Surgeon: Pricilla Riffle, MD;  Location: [facility] ENDOSCOPY;  Service: Cardiovascular;  Laterality: N/A;   Social History   Occupational History  . Occupation: retired    Comment: Agricultural engineer   Tobacco Use  . Smoking status: Never Smoker  . Smokeless tobacco: Never Used  Substance and Sexual Activity  . Alcohol use: No  . Drug use: No  . Sexual activity: Not Currently

## 2022-04-18 ENCOUNTER — Encounter: Payer: Self-pay | Admitting: Urology

## 2022-04-18 ENCOUNTER — Ambulatory Visit (INDEPENDENT_AMBULATORY_CARE_PROVIDER_SITE_OTHER): Payer: Commercial Managed Care - HMO | Admitting: Urology

## 2022-04-18 VITALS — BP 137/85 | HR 50

## 2022-04-18 DIAGNOSIS — R8290 Unspecified abnormal findings in urine: Secondary | ICD-10-CM

## 2022-04-18 DIAGNOSIS — N200 Calculus of kidney: Secondary | ICD-10-CM | POA: Insufficient documentation

## 2022-04-18 LAB — MICROSCOPIC EXAMINATION
RBC, Urine: >30 /hpf — AB (ref 0–2)
Renal Epithel, UA: NONE SEEN /hpf
WBC, UA: >30 /hpf — AB (ref 0–5)

## 2022-04-18 LAB — URINALYSIS, ROUTINE W REFLEX MICROSCOPIC
Bilirubin, UA: NEGATIVE
Glucose, UA: NEGATIVE
Nitrite, UA: POSITIVE — AB
Specific Gravity, UA: 1.02 (ref 1.005–1.030)
Urobilinogen, Ur: 0.2 mg/dL (ref 0.2–1.0)
pH, UA: 6 (ref 5.0–7.5)

## 2022-04-18 NOTE — Progress Notes (Signed)
Assessment: 1. Nephrolithiasis   2. Abnormal urine findings     Plan: Urine culture sent today. Unfortunately, I am unable to personally review the KUB from 04/10/2022 as this was performed at an outside facility.  I only have the report for review suggesting presence of a right staghorn calculus. The patient does not express any symptoms associated with the stone at this time. Request all urinalysis and urine culture results from [facility]. Schedule for CT renal stone protocol for evaluation of the right nephrolithiasis. Return to office after CT completed.  Chief Complaint:  Chief Complaint  Patient presents with   Nephrolithiasis    History of Present Illness:  Dawn Ho is a 71 y.o. female who is seen in consultation from Bernerd Limbo, MD for evaluation of nephrolithiasis.  She is a resident at [facility] and [employer].  She has multiple medical problems including diabetes, chronic kidney disease, history of CVA, and hypertension.  She was recently evaluated for left sided abdominal pain.  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  She denies any right-sided abdominal or flank pain.  She has a prior history of kidney stones undergoing surgical management >10 years ago.  No records available.  CT imaging from 1/18 showed nonobstructing stones in the right kidney.  CT from 3/19 did not show any obvious renal or ureteral calculi.  She denies any recent dysuria or gross hematuria.  She does report that she recently had her "period".   Review of her chart shows that she is currently on fosfomycin x3 doses.  No urine to results available for review.  Past Medical History:  Past Medical History:  Diagnosis Date   Cataract, left eye    CVA (cerebral infarction)    Depression with anxiety    Diabetes mellitus    Dyslipidemia    Fibromyalgia    Hypertension    Retinopathy    Stroke (HCC)    L. hemiparesis   TIA (transient  ischemic attack)     Past Surgical History:  Past Surgical History:  Procedure Laterality Date   ABDOMINAL HYSTERECTOMY     age 51, "removed one ovary- excessive bleeding"   CESAREAN SECTION     CHOLECYSTECTOMY     LOOP RECORDER IMPLANT     Dr. Johney Frame placed 10/15/2014   ORIF HIP FRACTURE Right 09/10/2020   Procedure: im nail;  Surgeon: Eldred Manges, MD;  Location: [facility];  Service: Orthopedics;  Laterality: Right;   TEE WITHOUT CARDIOVERSION N/A 10/15/2014   Procedure: TRANSESOPHAGEAL ECHOCARDIOGRAM (TEE);  Surgeon: Pricilla Riffle, MD;  Location[facility] ENDOSCOPY;  Service: Cardiovascular;  Laterality: N/A;    Allergies:  Allergies  Allergen Reactions   Ivp Dye [Iodinated Contrast Media]     Per pt: swelling with first contrast xray in 1969. Second contrast xray in the 90s without symptoms:  Patient has been pre-medicated for CT scans in 2016. Needs to have pre-medications before any scans with IV contrast. / TSF 08/30/16   Penicillins Swelling and Rash    Family History:  Family History  Problem Relation Age of Onset   Heart disease Father    Hypertension Sister    Colon cancer Neg Hx    Colon polyps Neg Hx     Social History:  Social History   Tobacco Use   Smoking status: Never   Smokeless tobacco: Never  Substance Use Topics   Alcohol use: No   Drug  use: No    Review of symptoms:  Constitutional:  Negative for unexplained weight loss, night sweats, fever, chills ENT:  Negative for nose bleeds, sinus pain, painful swallowing CV:  Negative for chest pain, shortness of breath, exercise intolerance, palpitations, loss of consciousness Resp:  Negative for cough, wheezing, shortness of breath GI:  Negative for nausea, vomiting, diarrhea, bloody stools GU:  Positives noted in HPI; otherwise negative for gross hematuria, dysuria, urinary incontinence Neuro:  Negative for seizures, poor balance, limb weakness, slurred speech Psych:  Negative for lack of energy, depression,  anxiety Endocrine:  Negative for polydipsia, polyuria, symptoms of hypoglycemia (dizziness, hunger, sweating) Hematologic:  Negative for anemia, purpura, petechia, prolonged or excessive bleeding, use of anticoagulants  Allergic:  Negative for difficulty breathing or choking as a result of exposure to anything; no shellfish allergy; no allergic response (rash/itch) to materials, foods  Physical exam: BP 137/85   Pulse (!) 50  GENERAL APPEARANCE:  Well appearing, well developed, well nourished, NAD HEENT: Atraumatic, Normocephalic, oropharynx clear. NECK: Supple without lymphadenopathy or thyromegaly. LUNGS: Clear to auscultation bilaterally. HEART: Regular Rate and Rhythm without murmurs, gallops, or rubs. ABDOMEN: Soft, non-tender, No Masses. EXTREMITIES: Without clubbing, cyanosis, or edema. NEUROLOGIC:  Alert and oriented x 3, in wheelchair, CN II-XII grossly intact.  MENTAL STATUS:  Appropriate. BACK:  Non-tender to palpation.  No CVAT SKIN:  Warm, dry and intact.    Results: U/A: >30 WBCs, >30 RBCs, many bacteria, nitrite positive

## 2022-04-22 LAB — URINE CULTURE

## 2022-04-24 ENCOUNTER — Telehealth: Payer: Self-pay

## 2022-04-24 MED ORDER — CIPROFLOXACIN HCL 500 MG PO TABS: 500.0000 mg | ORAL_TABLET | Freq: Two times a day (BID) | ORAL | 0 refills | Status: DC

## 2022-04-24 NOTE — Telephone Encounter (Signed)
-----   Message from Milderd Meager, MD sent at 04/24/2022  8:31 AM EDT ----- Please call [facility] and give orders for Cipro 500 mg PO BID x 5 days for UTI treatment.

## 2022-04-24 NOTE — Telephone Encounter (Signed)
Spoke with Dorathy Daft, nursing supervisor, at [facility],  Order given for medication Will fax over rx and urine culture result per [facility] request to [phone]

## 2022-05-10 ENCOUNTER — Ambulatory Visit (HOSPITAL_COMMUNITY)
Admission: RE | Admit: 2022-05-10 | Discharge: 2022-05-10 | Disposition: A | Discharge status: Home / Self Care | Payer: Medicare Other | Source: Ambulatory Visit | Attending: Urology | Admitting: Urology

## 2022-05-10 DIAGNOSIS — N200 Calculus of kidney: Secondary | ICD-10-CM | POA: Diagnosis present

## 2022-05-11 ENCOUNTER — Ambulatory Visit (INDEPENDENT_AMBULATORY_CARE_PROVIDER_SITE_OTHER): Payer: Medicare Other | Admitting: Urology

## 2022-05-11 ENCOUNTER — Encounter: Payer: Self-pay | Admitting: Urology

## 2022-05-11 VITALS — BP 118/76 | HR 55 | Ht 60.0 in | Wt 162.0 lb

## 2022-05-11 DIAGNOSIS — N200 Calculus of kidney: Secondary | ICD-10-CM | POA: Diagnosis not present

## 2022-05-11 DIAGNOSIS — Z87440 Personal history of urinary (tract) infections: Secondary | ICD-10-CM | POA: Diagnosis not present

## 2022-05-11 NOTE — Progress Notes (Signed)
Assessment: 1. Nephrolithiasis   2. History of UTI     Plan: I personally reviewed the CT study from 05/10/2022 showing large staghorn calculi bilaterally without obvious obstruction. I also reviewed a urine culture sent from her facility. Given the stone volume bilaterally, percutaneous nephrolithotomy would likely be the best option to try to achieve a stone free status.  Shockwave lithotripsy or ureteroscopic laser lithotripsy are options but would require multiple treatments.  Given her multiple comorbidities, she would be at increased risk for any surgical procedure. I would like to discuss possible surgical management with my colleagues in [place]. Please obtain urinalysis and culture and fax results to [phone] Will call to arrange follow-up.   Chief Complaint:  Chief Complaint  Patient presents with   Nephrolithiasis    History of Present Illness:  Dawn Ho is a 71 y.o. female who is seen for further evaluation of nephrolithiasis and UTI.  She is a resident at [facility] and [employer].  She has multiple medical problems including diabetes, chronic kidney disease, history of CVA, and hypertension.  She was recently evaluated for left sided abdominal pain.  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  She denies any right-sided abdominal or flank pain.  She has a prior history of kidney stones undergoing surgical management >10 years ago.  No records available.  CT imaging from 1/18 showed nonobstructing stones in the right kidney.  CT from 3/19 did not show any obvious renal or ureteral calculi.  She denies any recent dysuria or gross hematuria.  She does report that she recently had her "period".   Urine culture from 8/23:  E. Coli, Proteus, Enterococcus, providencia Urine culture from 04/22/22:  >100K Klebsiella; treated with Cipro x5 days.  CT renal stone study from 05/10/22 showed 2 large calculi in right renal pelvis and  upper pole collecting system, partial staghorn calculus in upper collecting system and renal pelvis on left, no evidence of obstruction.    She returns today to discuss the CT results.  She is not having any flank pain at the present time.  She denies any dysuria or gross hematuria.  No fevers or chills.  She is nonambulatory.  Portions of the above documentation were copied from a prior visit for review purposes only.   Past Medical History:  Past Medical History:  Diagnosis Date   Cataract, left eye    CVA (cerebral infarction)    Depression with anxiety    Diabetes mellitus    Dyslipidemia    Fibromyalgia    Hypertension    Retinopathy    Stroke (HCC)    L. hemiparesis   TIA (transient ischemic attack)     Past Surgical History:  Past Surgical History:  Procedure Laterality Date   ABDOMINAL HYSTERECTOMY     age 74, "removed one ovary- excessive bleeding"   CESAREAN SECTION     CHOLECYSTECTOMY     LOOP RECORDER IMPLANT     Dr. Rayann Heman placed 10/15/2014   ORIF HIP FRACTURE Right 09/10/2020   Procedure: im nail;  Surgeon: Marybelle Killings, MD;  Location: [facility];  Service: Orthopedics;  Laterality: Right;   TEE WITHOUT CARDIOVERSION N/A 10/15/2014   Procedure: TRANSESOPHAGEAL ECHOCARDIOGRAM (TEE);  Surgeon: Fay Records, MD;  Location: [facility] ENDOSCOPY;  Service: Cardiovascular;  Laterality: N/A;    Allergies:  Allergies  Allergen Reactions   Iodine    Ivp Dye [Iodinated Contrast Media]  Per pt: swelling with first contrast xray in 1969. Second contrast xray in the 90s without symptoms:  Patient has been pre-medicated for CT scans in 2016. Needs to have pre-medications before any scans with IV contrast. / TSF 08/30/16   Penicillins Swelling and Rash    Family History:  Family History  Problem Relation Age of Onset   Heart disease Father    Hypertension Sister    Colon cancer Neg Hx    Colon polyps Neg Hx     Social History:  Social History   Tobacco Use   Smoking  status: Never   Smokeless tobacco: Never  Substance Use Topics   Alcohol use: No   Drug use: No    ROS: Constitutional:  Negative for fever, chills, weight loss CV: Negative for chest pain, previous MI, hypertension Respiratory:  Negative for shortness of breath, wheezing, sleep apnea, frequent cough GI:  Negative for nausea, vomiting, bloody stool, GERD  Physical exam: BP 118/76   Pulse (!) 55   Ht 5' (1.524 m)   Wt 162 lb (73.5 kg)   BMI 31.64 kg/m  GENERAL APPEARANCE:  Well appearing, well developed, well nourished, NAD HEENT:  Atraumatic, normocephalic, oropharynx clear NECK:  Supple without lymphadenopathy or thyromegaly ABDOMEN:  Soft, non-tender, no masses EXTREMITIES:  Without clubbing, cyanosis, or edema NEUROLOGIC:  Alert and oriented x 3, in wheelchair, CN II-XII grossly intact MENTAL STATUS:  appropriate BACK:  Non-tender to palpation, No CVAT SKIN:  Warm, dry, and intact  Results: No specimen provided

## 2022-05-15 ENCOUNTER — Telehealth: Payer: Self-pay

## 2022-05-15 NOTE — Telephone Encounter (Signed)
Facility called advising that per AVS patient was to have another CT and follow up after imaging was completed. I did not see a referral for radiology. I wanted to confirm that another CT was needed and they were not looking at an old note from a prior visit. I know patient had a CT completed on 9/21.

## 2022-05-16 ENCOUNTER — Other Ambulatory Visit: Payer: Self-pay | Admitting: Urology

## 2022-05-16 ENCOUNTER — Ambulatory Visit: Payer: Commercial Managed Care - HMO | Admitting: Internal Medicine

## 2022-05-16 DIAGNOSIS — N200 Calculus of kidney: Secondary | ICD-10-CM

## 2022-06-28 ENCOUNTER — Telehealth: Payer: Self-pay

## 2022-06-28 NOTE — Telephone Encounter (Signed)
Dorian Furnace NP from [facility] called wanting in know if there was a closer hospital other than [facility] to send patient for treatment of her bilateral staghorn calculi.

## 2022-07-03 ENCOUNTER — Telehealth: Payer: Self-pay

## 2022-07-03 NOTE — Telephone Encounter (Signed)
Patient was referred to [facility] by Dr. Pete Glatter. Patient's family does not want patient to travel to [facility].  Would like for patient to receive care locally.  Please advise.  Call [facility] (appointment coordinator) [phone].  Thanks, Rosey Bath

## 2022-07-03 NOTE — Telephone Encounter (Signed)
Tried to reach appt coordinator at given number, no answer and no way to leave voicemail.  Will try again at a later time.

## 2022-07-04 NOTE — Telephone Encounter (Signed)
Myrtis Ser coordinator wanted to ask if you are willing to speak to the pt's son regarding her need for care at [facility].  Patient's family is refusing to transfer her care that far away.  I informed her of your previous response to pt's need for referral to [facility] and that there were no other local options.  The son's number is in pt's chart.

## 2022-07-06 NOTE — Telephone Encounter (Signed)
Received a call from [facility] NP.  Returned a call and left a voicemail informing her that MD has personally been in touch with pt's family in regards to the need for her care to be transferred to [facility].  Office c/b number left on vm for her to reach back out with any other questions.

## 2022-07-17 ENCOUNTER — Telehealth: Payer: Self-pay

## 2022-07-17 NOTE — Telephone Encounter (Signed)
[facility] called stating that Dr. Pete Glatter had referred this patient to [facility] and at first the family didn't want her to go there. Stated that the family has agreed for the patient to go there, but the office needs another referral.

## 2022-07-20 ENCOUNTER — Other Ambulatory Visit: Payer: Self-pay | Admitting: Urology

## 2022-07-20 DIAGNOSIS — N200 Calculus of kidney: Secondary | ICD-10-CM

## 2022-07-20 NOTE — Telephone Encounter (Signed)
Please see request for referral

## 2022-07-24 NOTE — Telephone Encounter (Signed)
See new referral

## 2022-09-11 ENCOUNTER — Telehealth: Payer: Self-pay

## 2022-09-11 NOTE — Telephone Encounter (Signed)
NP from [facility] called to let Dr. Felipa Eth know that the patient was seen at [facility] and they sent her home and said they were not sure why she was refferred.  She is asking what the POC is for the patient.  We will request records from [facility] and forward to Dr. Felipa Eth for next steps.

## 2022-09-27 NOTE — Telephone Encounter (Signed)
I was unable to get in touch with a Nurse at [facility] to follow up with the concerns from Arsenio Loader, NP in regards to patient's plan of care.  I reached out to Arsenio Loader and informed her of the notes from [facility] and she states she will reach out to the patient's son.  Arsenio Loader, NP informed me to reach out the Brandy the DON of the facility.  I called her to let her know Our office received notes from [place] that states they will need to contact [facility] for pt to discuss POC.  Office notes should have been faxed to pt's facility, notes were faxed to Kathrine Cords: Theadora Rama, DON at [phone].  Arsenio Loader states that she spoke with the pt's son and he is aware of [facility]'s plan of care and accepts there recommendation.  Office notes were also cc'd to Dr. Felipa Eth for his records.

## 2023-06-04 ENCOUNTER — Inpatient Hospital Stay (HOSPITAL_COMMUNITY)
Admission: EM | Admit: 2023-06-04 | Discharge: 2023-06-10 | DRG: 871 | Disposition: A | Discharge status: Skilled Nursing Facility | Payer: Medicare Other | Source: Skilled Nursing Facility | Attending: Internal Medicine | Admitting: Internal Medicine

## 2023-06-04 ENCOUNTER — Inpatient Hospital Stay (HOSPITAL_COMMUNITY): Payer: Medicare Other

## 2023-06-04 ENCOUNTER — Emergency Department (HOSPITAL_COMMUNITY): Payer: Medicare Other

## 2023-06-04 ENCOUNTER — Other Ambulatory Visit: Payer: Self-pay

## 2023-06-04 ENCOUNTER — Encounter (HOSPITAL_COMMUNITY): Payer: Self-pay

## 2023-06-04 DIAGNOSIS — Z79899 Other long term (current) drug therapy: Secondary | ICD-10-CM

## 2023-06-04 DIAGNOSIS — E11319 Type 2 diabetes mellitus with unspecified diabetic retinopathy without macular edema: Secondary | ICD-10-CM | POA: Diagnosis present

## 2023-06-04 DIAGNOSIS — Z79890 Hormone replacement therapy (postmenopausal): Secondary | ICD-10-CM

## 2023-06-04 DIAGNOSIS — F32A Depression, unspecified: Secondary | ICD-10-CM | POA: Diagnosis present

## 2023-06-04 DIAGNOSIS — R6520 Severe sepsis without septic shock: Secondary | ICD-10-CM | POA: Diagnosis present

## 2023-06-04 DIAGNOSIS — E1140 Type 2 diabetes mellitus with diabetic neuropathy, unspecified: Secondary | ICD-10-CM | POA: Diagnosis present

## 2023-06-04 DIAGNOSIS — R319 Hematuria, unspecified: Secondary | ICD-10-CM | POA: Diagnosis not present

## 2023-06-04 DIAGNOSIS — E870 Hyperosmolality and hypernatremia: Secondary | ICD-10-CM | POA: Insufficient documentation

## 2023-06-04 DIAGNOSIS — E669 Obesity, unspecified: Secondary | ICD-10-CM | POA: Diagnosis present

## 2023-06-04 DIAGNOSIS — B952 Enterococcus as the cause of diseases classified elsewhere: Secondary | ICD-10-CM | POA: Diagnosis present

## 2023-06-04 DIAGNOSIS — Z91041 Radiographic dye allergy status: Secondary | ICD-10-CM

## 2023-06-04 DIAGNOSIS — Z794 Long term (current) use of insulin: Secondary | ICD-10-CM | POA: Diagnosis not present

## 2023-06-04 DIAGNOSIS — E872 Acidosis, unspecified: Secondary | ICD-10-CM | POA: Diagnosis present

## 2023-06-04 DIAGNOSIS — F418 Other specified anxiety disorders: Secondary | ICD-10-CM | POA: Diagnosis not present

## 2023-06-04 DIAGNOSIS — I69354 Hemiplegia and hemiparesis following cerebral infarction affecting left non-dominant side: Secondary | ICD-10-CM | POA: Diagnosis not present

## 2023-06-04 DIAGNOSIS — Z87442 Personal history of urinary calculi: Secondary | ICD-10-CM

## 2023-06-04 DIAGNOSIS — D631 Anemia in chronic kidney disease: Secondary | ICD-10-CM | POA: Diagnosis present

## 2023-06-04 DIAGNOSIS — M797 Fibromyalgia: Secondary | ICD-10-CM | POA: Diagnosis present

## 2023-06-04 DIAGNOSIS — F0393 Unspecified dementia, unspecified severity, with mood disturbance: Secondary | ICD-10-CM | POA: Diagnosis present

## 2023-06-04 DIAGNOSIS — Z6831 Body mass index (BMI) 31.0-31.9, adult: Secondary | ICD-10-CM

## 2023-06-04 DIAGNOSIS — N179 Acute kidney failure, unspecified: Secondary | ICD-10-CM | POA: Insufficient documentation

## 2023-06-04 DIAGNOSIS — G9341 Metabolic encephalopathy: Secondary | ICD-10-CM | POA: Diagnosis not present

## 2023-06-04 DIAGNOSIS — E871 Hypo-osmolality and hyponatremia: Secondary | ICD-10-CM | POA: Diagnosis present

## 2023-06-04 DIAGNOSIS — N3001 Acute cystitis with hematuria: Secondary | ICD-10-CM | POA: Diagnosis not present

## 2023-06-04 DIAGNOSIS — E860 Dehydration: Secondary | ICD-10-CM | POA: Diagnosis present

## 2023-06-04 DIAGNOSIS — E1122 Type 2 diabetes mellitus with diabetic chronic kidney disease: Secondary | ICD-10-CM | POA: Diagnosis present

## 2023-06-04 DIAGNOSIS — A4181 Sepsis due to Enterococcus: Principal | ICD-10-CM | POA: Diagnosis present

## 2023-06-04 DIAGNOSIS — Z8249 Family history of ischemic heart disease and other diseases of the circulatory system: Secondary | ICD-10-CM

## 2023-06-04 DIAGNOSIS — E039 Hypothyroidism, unspecified: Secondary | ICD-10-CM | POA: Diagnosis present

## 2023-06-04 DIAGNOSIS — E876 Hypokalemia: Secondary | ICD-10-CM | POA: Diagnosis present

## 2023-06-04 DIAGNOSIS — Z880 Allergy status to penicillin: Secondary | ICD-10-CM

## 2023-06-04 DIAGNOSIS — Z7982 Long term (current) use of aspirin: Secondary | ICD-10-CM

## 2023-06-04 DIAGNOSIS — N1832 Chronic kidney disease, stage 3b: Secondary | ICD-10-CM | POA: Diagnosis present

## 2023-06-04 DIAGNOSIS — E1165 Type 2 diabetes mellitus with hyperglycemia: Secondary | ICD-10-CM | POA: Diagnosis present

## 2023-06-04 DIAGNOSIS — I129 Hypertensive chronic kidney disease with stage 1 through stage 4 chronic kidney disease, or unspecified chronic kidney disease: Secondary | ICD-10-CM | POA: Diagnosis present

## 2023-06-04 DIAGNOSIS — N390 Urinary tract infection, site not specified: Secondary | ICD-10-CM | POA: Diagnosis present

## 2023-06-04 DIAGNOSIS — F0394 Unspecified dementia, unspecified severity, with anxiety: Secondary | ICD-10-CM | POA: Diagnosis present

## 2023-06-04 DIAGNOSIS — R339 Retention of urine, unspecified: Secondary | ICD-10-CM | POA: Diagnosis present

## 2023-06-04 DIAGNOSIS — Z8619 Personal history of other infectious and parasitic diseases: Secondary | ICD-10-CM

## 2023-06-04 DIAGNOSIS — Z7902 Long term (current) use of antithrombotics/antiplatelets: Secondary | ICD-10-CM

## 2023-06-04 DIAGNOSIS — A419 Sepsis, unspecified organism: Principal | ICD-10-CM | POA: Diagnosis present

## 2023-06-04 DIAGNOSIS — I639 Cerebral infarction, unspecified: Secondary | ICD-10-CM | POA: Diagnosis not present

## 2023-06-04 DIAGNOSIS — E785 Hyperlipidemia, unspecified: Secondary | ICD-10-CM | POA: Diagnosis present

## 2023-06-04 DIAGNOSIS — E878 Other disorders of electrolyte and fluid balance, not elsewhere classified: Secondary | ICD-10-CM | POA: Diagnosis present

## 2023-06-04 DIAGNOSIS — Z90710 Acquired absence of both cervix and uterus: Secondary | ICD-10-CM

## 2023-06-04 LAB — BLOOD GAS, VENOUS
Acid-base deficit: 1.2 mmol/L (ref 0.0–2.0)
Bicarbonate: 23.6 mmol/L (ref 20.0–28.0)
Drawn by: 65579
O2 Saturation: 82.6 %
Patient temperature: 37.9
pCO2, Ven: 41 mm[Hg] — ABNORMAL LOW (ref 44–60)
pH, Ven: 7.38 (ref 7.25–7.43)
pO2, Ven: 51 mm[Hg] — ABNORMAL HIGH (ref 32–45)

## 2023-06-04 LAB — BASIC METABOLIC PANEL WITH GFR
Anion gap: 8 (ref 5–15)
Anion gap: 10 (ref 5–15)
BUN: 61 mg/dL — ABNORMAL HIGH (ref 8–23)
BUN: 58 mg/dL — ABNORMAL HIGH (ref 8–23)
CO2: 22 mmol/L (ref 22–32)
CO2: 18 mmol/L — ABNORMAL LOW (ref 22–32)
Calcium: 9.3 mg/dL (ref 8.9–10.3)
Calcium: 8.4 mg/dL — ABNORMAL LOW (ref 8.9–10.3)
Chloride: 126 mmol/L — ABNORMAL HIGH (ref 98–111)
Chloride: 123 mmol/L — ABNORMAL HIGH (ref 98–111)
Creatinine, Ser: 1.61 mg/dL — ABNORMAL HIGH (ref 0.44–1.00)
Creatinine, Ser: 1.47 mg/dL — ABNORMAL HIGH (ref 0.44–1.00)
GFR, Estimated: 34 mL/min — ABNORMAL LOW
GFR, Estimated: 37 mL/min — ABNORMAL LOW
Glucose, Bld: 262 mg/dL — ABNORMAL HIGH (ref 70–99)
Glucose, Bld: 218 mg/dL — ABNORMAL HIGH (ref 70–99)
Potassium: 3.4 mmol/L — ABNORMAL LOW (ref 3.5–5.1)
Potassium: 3.4 mmol/L — ABNORMAL LOW (ref 3.5–5.1)
Sodium: 156 mmol/L — ABNORMAL HIGH (ref 135–145)
Sodium: 151 mmol/L — ABNORMAL HIGH (ref 135–145)

## 2023-06-04 LAB — CBC WITH DIFFERENTIAL/PLATELET
Abs Immature Granulocytes: 0.09 10*3/uL — ABNORMAL HIGH (ref 0.00–0.07)
Basophils Absolute: 0 10*3/uL (ref 0.0–0.1)
Basophils Relative: 0 %
Eosinophils Absolute: 0 10*3/uL (ref 0.0–0.5)
Eosinophils Relative: 0 %
HCT: 43 % (ref 36.0–46.0)
Hemoglobin: 13.6 g/dL (ref 12.0–15.0)
Immature Granulocytes: 1 %
Lymphocytes Relative: 12 %
Lymphs Abs: 2.1 10*3/uL (ref 0.7–4.0)
MCH: 30.4 pg (ref 26.0–34.0)
MCHC: 31.6 g/dL (ref 30.0–36.0)
MCV: 96 fL (ref 80.0–100.0)
Monocytes Absolute: 1.4 10*3/uL — ABNORMAL HIGH (ref 0.1–1.0)
Monocytes Relative: 8 %
Neutro Abs: 14.6 10*3/uL — ABNORMAL HIGH (ref 1.7–7.7)
Neutrophils Relative %: 79 %
Platelets: 253 10*3/uL (ref 150–400)
RBC: 4.48 MIL/uL (ref 3.87–5.11)
RDW: 13.6 % (ref 11.5–15.5)
WBC: 18.2 10*3/uL — ABNORMAL HIGH (ref 4.0–10.5)
nRBC: 0 % (ref 0.0–0.2)

## 2023-06-04 LAB — COMPREHENSIVE METABOLIC PANEL
ALT: 27 U/L (ref 0–44)
AST: 35 U/L (ref 15–41)
Albumin: 3 g/dL — ABNORMAL LOW (ref 3.5–5.0)
Alkaline Phosphatase: 66 U/L (ref 38–126)
Anion gap: 12 (ref 5–15)
BUN: 62 mg/dL — ABNORMAL HIGH (ref 8–23)
CO2: 19 mmol/L — ABNORMAL LOW (ref 22–32)
Calcium: 9.3 mg/dL (ref 8.9–10.3)
Chloride: 123 mmol/L — ABNORMAL HIGH (ref 98–111)
Creatinine, Ser: 1.68 mg/dL — ABNORMAL HIGH (ref 0.44–1.00)
GFR, Estimated: 32 mL/min — ABNORMAL LOW (ref >60)
Glucose, Bld: 371 mg/dL — ABNORMAL HIGH (ref 70–99)
Potassium: 3.4 mmol/L — ABNORMAL LOW (ref 3.5–5.1)
Sodium: 154 mmol/L — ABNORMAL HIGH (ref 135–145)
Total Bilirubin: 0.5 mg/dL (ref 0.3–1.2)
Total Protein: 7.2 g/dL (ref 6.5–8.1)

## 2023-06-04 LAB — RAPID URINE DRUG SCREEN, HOSP PERFORMED
Amphetamines: NOT DETECTED
Barbiturates: NOT DETECTED
Benzodiazepines: NOT DETECTED
Cocaine: NOT DETECTED
Opiates: NOT DETECTED
Tetrahydrocannabinol: NOT DETECTED

## 2023-06-04 LAB — URINALYSIS, ROUTINE W REFLEX MICROSCOPIC
Bilirubin Urine: NEGATIVE
Glucose, UA: >=500 mg/dL — AB
Ketones, ur: NEGATIVE mg/dL
Nitrite: NEGATIVE
Protein, ur: 100 mg/dL — AB
RBC / HPF: >50 RBC/hpf (ref 0–5)
Specific Gravity, Urine: 1.012 (ref 1.005–1.030)
WBC, UA: >50 WBC/hpf (ref 0–5)
pH: 7 (ref 5.0–8.0)

## 2023-06-04 LAB — CBG MONITORING, ED
Glucose-Capillary: 240 mg/dL — ABNORMAL HIGH (ref 70–99)
Glucose-Capillary: 244 mg/dL — ABNORMAL HIGH (ref 70–99)

## 2023-06-04 LAB — MAGNESIUM: Magnesium: 2.6 mg/dL — ABNORMAL HIGH (ref 1.7–2.4)

## 2023-06-04 LAB — LACTIC ACID, PLASMA
Lactic Acid, Venous: 1.9 mmol/L (ref 0.5–1.9)
Lactic Acid, Venous: 1.6 mmol/L (ref 0.5–1.9)

## 2023-06-04 LAB — TSH: TSH: 1.769 u[IU]/mL (ref 0.350–4.500)

## 2023-06-04 LAB — GLUCOSE, CAPILLARY: Glucose-Capillary: 168 mg/dL — ABNORMAL HIGH (ref 70–99)

## 2023-06-04 LAB — PROCALCITONIN: Procalcitonin: <0.1 ng/mL

## 2023-06-04 LAB — VITAMIN B12: Vitamin B-12: 1048 pg/mL — ABNORMAL HIGH (ref 180–914)

## 2023-06-04 LAB — HEMOGLOBIN A1C
Hgb A1c MFr Bld: 8.3 % — ABNORMAL HIGH (ref 4.8–5.6)
Mean Plasma Glucose: 191.51 mg/dL

## 2023-06-04 LAB — AMMONIA: Ammonia: 26 umol/L (ref 9–35)

## 2023-06-04 MED ORDER — HEPARIN SODIUM (PORCINE) 5000 UNIT/ML IJ SOLN
5000.0000 [IU] | Freq: Three times a day (TID) | INTRAMUSCULAR | Status: DC
  Administered 2023-06-04 – 2023-06-10 (×17): 5000 [IU] via SUBCUTANEOUS
  Filled 2023-06-04 (×17): qty 1

## 2023-06-04 MED ORDER — INSULIN ASPART 100 UNIT/ML IJ SOLN
0.0000 [IU] | INTRAMUSCULAR | Status: DC
  Administered 2023-06-04: 3 [IU] via SUBCUTANEOUS
  Administered 2023-06-05: 2 [IU] via SUBCUTANEOUS
  Administered 2023-06-05: 9 [IU] via SUBCUTANEOUS
  Administered 2023-06-06: 3 [IU] via SUBCUTANEOUS
  Administered 2023-06-06: 7 [IU] via SUBCUTANEOUS
  Administered 2023-06-06 (×3): 2 [IU] via SUBCUTANEOUS
  Administered 2023-06-07: 3 [IU] via SUBCUTANEOUS
  Administered 2023-06-07 (×2): 2 [IU] via SUBCUTANEOUS
  Administered 2023-06-07: 3 [IU] via SUBCUTANEOUS
  Administered 2023-06-07 – 2023-06-08 (×2): 2 [IU] via SUBCUTANEOUS
  Administered 2023-06-08 (×2): 3 [IU] via SUBCUTANEOUS
  Administered 2023-06-08: 1 [IU] via SUBCUTANEOUS
  Filled 2023-06-04: qty 1

## 2023-06-04 MED ORDER — SODIUM CHLORIDE 0.45 % IV SOLN
INTRAVENOUS | Status: AC
  Administered 2023-06-04 – 2023-06-05 (×2): 125 mL/h via INTRAVENOUS

## 2023-06-04 MED ORDER — SODIUM CHLORIDE 0.9 % IV SOLN: 2.0000 g | INTRAVENOUS | Status: DC

## 2023-06-04 MED ORDER — POTASSIUM CHLORIDE 10 MEQ/100ML IV SOLN
10.0000 meq | INTRAVENOUS | Status: AC
  Administered 2023-06-04: 10 meq via INTRAVENOUS
  Filled 2023-06-04: qty 100

## 2023-06-04 MED ORDER — SODIUM CHLORIDE 0.45 % IV BOLUS
1000.0000 mL | Freq: Once | INTRAVENOUS | Status: AC
  Administered 2023-06-04: 1000 mL via INTRAVENOUS

## 2023-06-04 MED ORDER — ONDANSETRON HCL 4 MG PO TABS: 4.0000 mg | ORAL_TABLET | Freq: Four times a day (QID) | ORAL | Status: DC | PRN

## 2023-06-04 MED ORDER — ONDANSETRON HCL 4 MG/2ML IJ SOLN: 4.0000 mg | Freq: Four times a day (QID) | INTRAMUSCULAR | Status: DC | PRN

## 2023-06-04 MED ORDER — SODIUM CHLORIDE 0.9 % IV BOLUS: 1000.0000 mL | Freq: Once | INTRAVENOUS | Status: DC

## 2023-06-04 MED ORDER — POTASSIUM CHLORIDE 10 MEQ/100ML IV SOLN
10.0000 meq | INTRAVENOUS | Status: AC
  Administered 2023-06-04 – 2023-06-05 (×3): 10 meq via INTRAVENOUS
  Filled 2023-06-04 (×3): qty 100

## 2023-06-04 MED ORDER — SODIUM CHLORIDE 0.9 % IV SOLN
1.0000 g | Freq: Two times a day (BID) | INTRAVENOUS | Status: DC
  Administered 2023-06-04 – 2023-06-07 (×6): 1 g via INTRAVENOUS
  Filled 2023-06-04 (×6): qty 20

## 2023-06-04 NOTE — H&P (Signed)
History and Physical    Patient: Dawn Ho:[id number] DOB: 06-10-1951 DOA: 06/04/2023 DOS: the patient was seen and examined on 06/04/2023 PCP: Bernerd Limbo, MD  Patient coming from: SNF.   Dependent for some ADL's at baseline.  Uses wheelchair.  Chief Complaint:  Chief Complaint  Patient presents with   Weakness   HPI: Dawn Ho is a 72 y.o. female with PMH of dementia, CVA with left hemiparesis, ESBL infection, IDDM-2, HTN, HLD, anxiety, depression and fibromyalgia brought to ED by EMS due to "abnormal labs" and altered mental status.   Patient with advanced dementia and encephalopathy.  History provided by patient's son and daughter at bedside.  Per patient's son, patient's niece saw patient 4 days ago.  At that time, she was sleepy than usual.  Normally, she is awake, alert and oriented to self and person but not place and time.  Able to push herself around with her wheelchair.  She was not eating and drinking well.  They recently started assistance with feeding.  Patient's son received a call from nursing home stating that they are placing a PICC line for hydration and IV antibiotics.  No report of respiratory or GI symptoms.  In ED, mild temp to 100.3 which can be considered fever given age.  Mild tachypnea to 24. Na 154.  K3.4.  Chloride 123.  CO2 19.  AG 12.  Glucose 371. Cr 1.68.  BUN 62.  WBC 18.2 with left shift.  UA with large LE, large Hgb, few bacteria, negative nitrite and > 50 RBC.  Foley catheter placed.  UDS negative.  CXR without acute finding.  Single set blood culture, 2 L NS bolus, lactic acid and IV ceftriaxone ordered.  Hospitalist service called for admission for urosepsis.   Review of Systems: Unable to review all systems due to lack of cooperation from patient. Past Medical History:  Diagnosis Date   Cataract, left eye    CVA (cerebral infarction)    Depression with anxiety    Diabetes mellitus    Dyslipidemia    Fibromyalgia     Hypertension    Retinopathy    Stroke (HCC)    L. hemiparesis   TIA (transient ischemic attack)    Past Surgical History:  Procedure Laterality Date   ABDOMINAL HYSTERECTOMY     age 12, "removed one ovary- excessive bleeding"   CESAREAN SECTION     CHOLECYSTECTOMY     LOOP RECORDER IMPLANT     Dr. Johney Frame placed 10/15/2014   ORIF HIP FRACTURE Right 09/10/2020   Procedure: im nail;  Surgeon: Eldred Manges, MD;  Location: [facility];  Service: Orthopedics;  Laterality: Right;   TEE WITHOUT CARDIOVERSION N/A 10/15/2014   Procedure: TRANSESOPHAGEAL ECHOCARDIOGRAM (TEE);  Surgeon: Pricilla Riffle, MD;  Location: [facility] ENDOSCOPY;  Service: Cardiovascular;  Laterality: N/A;   Social History:  reports that she has never smoked. She has never used smokeless tobacco. She reports that she does not drink alcohol and does not use drugs.  Allergies  Allergen Reactions   Iodine    Ivp Dye [Iodinated Contrast Media]     Per pt: swelling with first contrast xray in 1969. Second contrast xray in the 90s without symptoms:  Patient has been pre-medicated for CT scans in 2016. Needs to have pre-medications before any scans with IV contrast. / TSF 08/30/16   Penicillins Swelling and Rash    Family History  Problem Relation Age of Onset   Heart disease Father  Hypertension Sister    Colon cancer Neg Hx    Colon polyps Neg Hx     Prior to Admission medications   Medication Sig Start Date End Date Taking? Authorizing Provider  acetaminophen (TYLENOL) 325 MG tablet Take 650 mg by mouth every 6 (six) hours as needed.    [provider]  acetaminophen (TYLENOL) 650 MG CR tablet Take 650 mg by mouth every 8 (eight) hours as needed for pain.    [provider]  Amino Acids-Protein Hydrolys (FEEDING SUPPLEMENT, PRO-STAT SUGAR FREE 64,) LIQD Take 30 mLs by mouth daily.    [provider]  aspirin (BAYER ASPIRIN) 325 MG tablet Take 1 tablet (325 mg total) by mouth daily. Take one daily for 30  days then stop and resume baby aspirin 09/11/20   Eldred Manges, MD  atorvastatin (LIPITOR) 20 MG tablet Take 1 tablet (20 mg total) by mouth daily at 6 PM. 10/18/14   Meredeth Ide, MD  ciprofloxacin (CIPRO) 500 MG tablet Take 1 tablet (500 mg total) by mouth 2 (two) times daily. 04/24/22   Stoneking, Danford Bad., MD  clopidogrel (PLAVIX) 75 MG tablet Take 1 tablet (75 mg total) by mouth daily. 10/18/14   Meredeth Ide, MD  clotrimazole-betamethasone (LOTRISONE) cream Apply topically. 06/21/20   [provider]  cyanocobalamin (,VITAMIN B-12,) 1000 MCG/ML injection Inject 1 mL (1,000 mcg total) into the muscle every 30 (thirty) days. 12/11/17   Dhungel, Nishant, MD  escitalopram (LEXAPRO) 10 MG tablet Take 20 mg by mouth daily. 04/14/20   [provider]  esomeprazole (NEXIUM) 40 MG packet Take 40 mg by mouth daily before breakfast.    [provider]  famotidine (PEPCID) 20 MG tablet  12/05/19   [provider]  feeding supplement, GLUCERNA SHAKE, (GLUCERNA SHAKE) LIQD Take 237 mLs by mouth 3 (three) times daily between meals. 11/14/17   Dhungel, Theda Belfast, MD  fosfomycin (MONUROL) 3 g PACK Take by mouth. 04/13/22   [provider]  furosemide (LASIX) 20 MG tablet Take 60 mg by mouth daily.    [provider]  gabapentin (NEURONTIN) 300 MG capsule Take 1 capsule (300 mg total) by mouth at bedtime. 10/18/14   Meredeth Ide, MD  gabapentin (NEURONTIN) 400 MG capsule Take by mouth. 03/12/22   [provider]  gabapentin (NEURONTIN) 600 MG tablet Take 600 mg by mouth daily. 07/13/20   [provider]  indomethacin (INDOCIN) 25 MG capsule Take 25 mg by mouth 3 (three) times daily. 04/11/22   [provider]  insulin aspart (NOVOLOG) 100 UNIT/ML injection Inject 0-5 Units into the skin at bedtime. 09/12/20   Dahal, Melina Schools, MD  insulin aspart (NOVOLOG) 100 UNIT/ML injection Inject 0-9 Units into the skin 3 (three) times daily with meals.  09/12/20   Lorin Glass, MD  insulin glargine (LANTUS) 100 UNIT/ML injection Inject 0.1 mLs (10 Units total) into the skin at bedtime. 09/12/20   Dahal, Melina Schools, MD  levothyroxine (SYNTHROID, LEVOTHROID) 112 MCG tablet Take 112 mcg by mouth daily.    [provider]  lidocaine (LIDODERM) 5 % 1 patch daily. 09/05/20   [provider]  lisinopril (ZESTRIL) 2.5 MG tablet Take by mouth. 03/12/22   [provider]  LORazepam (ATIVAN) 0.5 MG tablet Take one tablet by mouth twice daily for anxiety Patient taking differently: Take 0.5 mg by mouth every morning. 11/14/17   Dhungel, Nishant, MD  metoprolol tartrate (LOPRESSOR) 50 MG tablet Take 50 mg by  mouth in the morning. Patient takes 50mg  in the morning and 25mg  in the evening    [provider]  Multiple Vitamins-Minerals (MULTIVITAMIN WITH MINERALS) tablet Take 1 tablet by mouth daily.    [provider]  nitroGLYCERIN (NITROSTAT) 0.4 MG SL tablet Place 0.4 mg under the tongue every 5 (five) minutes as needed for chest pain.    [provider]  [facility] powder Apply 1 application topically 2 (two) times daily. 07/25/20   [provider]  potassium chloride 20 MEQ/15ML (10%) SOLN SMARTSIG:Milliliter(s) By Mouth 03/30/22   [provider]  potassium chloride SA (K-DUR,KLOR-CON) 20 MEQ tablet Take 20 mEq by mouth daily.    [provider]  sennosides-docusate sodium (SENOKOT-S) 8.6-50 MG tablet Take 1 tablet by mouth daily.    [provider]    Physical Exam: Vitals:   06/04/23 1230 06/04/23 1247  BP: 115/71   Pulse: 68   Resp: (!) 24   SpO2: 94%   Weight:  73.5 kg  Height:  5' (1.524 m)   GENERAL: No apparent distress.  Nontoxic. HEENT: MMM.  hearing grossly intact.  No facial asymmetry. NECK: Supple.  No apparent JVD.  RESP:  No IWOB.  Fair aeration bilaterally. CVS:  RRR. Heart sounds normal.  ABD/GI/GU: BS+. Abd soft, NTND.  Foley catheter. MSK/EXT:    Luanna Salk moves all extremities.  No apparent deformity.  No edema.  Midline on right arm. SKIN: no apparent skin lesion or wound NEURO: Sleepy but wakes to voice.  Not alert.  Does not interact.  Barely follows commands.  Right eye PERRL.  This coronary on the left. PSYCH: Calm. Normal affect.  Data Reviewed: See HPI  Assessment and Plan: Principal Problem:   Acute metabolic encephalopathy Active Problems:   Depression with anxiety   CVA (cerebral vascular accident) (HCC)   HLD (hyperlipidemia)   Hypokalemia   Uncontrolled type 2 diabetes mellitus with hyperglycemia, with long-term current use of insulin (HCC)   Hematuria   Hypernatremia   AKI (acute kidney injury) (HCC)   Acute metabolic encephalopathy: Multifactorial including possible urosepsis, dehydration, hypernatremia.  UA with large LE and hematuria.  She has mild temp to 100.3 with leukocytosis.  At risk for bacteremia from recent midline.  Limited neuroexam due to mental status.  UDS negative. -Treat treatable causes-UTI, dehydration and hyponatremia -Basic encephalopathy workup including CT head, VBG, TSH, B12 and ammonia -Avoid or minimize sedating medication -Aspiration of fall precaution -N.p.o. pending SLP eval  Possible urosepsis: UA with large LE, large Hgb with > 50 RBC but few bacteria and negative nitrite.  She has a mild temp and leukocytosis.  History of ESBL.  She has midline placed and started on ertapenem at SNF.   -Changed NS bolus to 1/2NS bolus 2 L followed by maintenance given hypernatremia. -Follow lactic acid -Discontinue ceftriaxone and start meropenem -I urine culture to previous collection -Needs 2 sets of blood culture  Hypernatremia/hyperchloremia: Likely from dehydration. Na 154 and corrects to 158 for hyperglycemia. -IV fluid as above -Check BMP every 6 hours  AKI on CKD-3B/azotemia/dehydration: Unknown baseline but Cr 1.32 in 2022. Recent Labs    06/04/23 1250  BUN 62*  CREATININE 1.68*   -IV fluid as above -Continue monitoring -Strict intake and output.  -Continue Foley catheter for now.  Uncontrolled IDDM-2 with hyperglycemia: Not in DKA or HHS. -IV fluid as above -Start SSI every 4 hours -Check hemoglobin A1c  Non-anion gap metabolic acidosis: Due to AKI? -Continue monitoring  Hematuria: Seems to have some light sediments of blood in Foley catheter.  His send she is on Plavix and aspirin for CVA.  Hemoglobin normal. -Continue monitoring  History of stroke with residual left hemiparesis -Resume home meds after med rec -CT head -PT/OT eval  Dementia: Seems advanced.  Dependent for some ADL's.  Oriented to self and family at baseline. -Reorientation and delirium precaution  Anxiety/depression -Resume home meds as appropriate after med rec  Advance care planning: Discussed CODE STATUS including pros and cons of CPR intubation with patient's son and daughter at bedside.  Given her advanced dementia and comorbidities, I do not think those heroic measures to her best interest.  However, family likes to maintain full code for now -Consider palliative care based on clinical progress      Advance Care Planning:   Code Status: Full Code.  Discussed with patient's son and daughter at bedside  Consults: None  Family Communication: Updated patient's son and daughter at bedside  Severity of Illness: The appropriate patient status for this patient is INPATIENT. Inpatient status is judged to be reasonable and necessary in order to provide the required intensity of service to ensure the patient's safety. The patient's presenting symptoms, physical exam findings, and initial radiographic and laboratory data in the context of their chronic comorbidities is felt to place them at high risk for further clinical deterioration. Furthermore, it is not anticipated that the patient will be medically stable for discharge from the hospital within 2 midnights of admission.   * I  certify that at the point of admission it is my clinical judgment that the patient will require inpatient hospital care spanning beyond 2 midnights from the point of admission due to high intensity of service, high risk for further deterioration and high frequency of surveillance required.*  Author: Almon Hercules, MD 06/04/2023 3:58 PM  For on call review [email or web address].

## 2023-06-04 NOTE — Progress Notes (Signed)
Pharmacy Antibiotic Note  Dawn Ho is a 72 y.o. female admitted on 06/04/2023 with sepsis.  Pharmacy has been consulted for Merrem dosing. H/O ESBL K. Pneumo  Plan: Merrem 1gm IV q12h F/U cxs and clinical progress Monitor V/S, labs   Height: 5' (152.4 cm) Weight: 73.5 kg (162 lb 0.6 oz) IBW/kg (Calculated) : 45.5  No data recorded.  Recent Labs  Lab 06/04/23 1250 06/04/23 1453  WBC 18.2*  --   CREATININE 1.68*  --   LATICACIDVEN  --  1.9    Estimated Creatinine Clearance: 26.7 mL/min (A) (by C-G formula based on SCr of 1.68 mg/dL (H)).    Allergies  Allergen Reactions   Iodine    Ivp Dye [Iodinated Contrast Media]     Per pt: swelling with first contrast xray in 1969. Second contrast xray in the 90s without symptoms:  Patient has been pre-medicated for CT scans in 2016. Needs to have pre-medications before any scans with IV contrast. / TSF 08/30/16   Penicillins Swelling and Rash    Antimicrobials this admission: Ceftriaxone 10/15>> 10/15 Merrem 10/15>>    Microbiology results: 10/15 BCX: pending 10/15 UCX: TBC  Thank you for allowing pharmacy to be a part of this patient's care.  Elder Cyphers, BS Pharm D, BCPS Clinical Pharmacist 06/04/2023 4:08 PM

## 2023-06-04 NOTE — ED Provider Notes (Signed)
[place] EMERGENCY DEPARTMENT AT [facility] Provider Note   CSN: [id number] Arrival date & time: 06/04/23  1209     History {Add pertinent medical, surgical, social history, OB history to HPI:1} Chief Complaint  Patient presents with   Weakness    Dawn Ho is a 72 y.o. female.  Patient has a history of a stroke diabetes and hypertension.  For the last few days she been getting weak and more lethargic   Weakness      Home Medications Prior to Admission medications   Medication Sig Start Date End Date Taking? Authorizing Provider  acetaminophen (TYLENOL) 325 MG tablet Take 650 mg by mouth every 6 (six) hours as needed.    [provider]  acetaminophen (TYLENOL) 650 MG CR tablet Take 650 mg by mouth every 8 (eight) hours as needed for pain.    [provider]  Amino Acids-Protein Hydrolys (FEEDING SUPPLEMENT, PRO-STAT SUGAR FREE 64,) LIQD Take 30 mLs by mouth daily.    [provider]  aspirin (BAYER ASPIRIN) 325 MG tablet Take 1 tablet (325 mg total) by mouth daily. Take one daily for 30 days then stop and resume baby aspirin 09/11/20   Eldred Manges, MD  atorvastatin (LIPITOR) 20 MG tablet Take 1 tablet (20 mg total) by mouth daily at 6 PM. 10/18/14   Meredeth Ide, MD  ciprofloxacin (CIPRO) 500 MG tablet Take 1 tablet (500 mg total) by mouth 2 (two) times daily. 04/24/22   Stoneking, Danford Bad., MD  clopidogrel (PLAVIX) 75 MG tablet Take 1 tablet (75 mg total) by mouth daily. 10/18/14   Meredeth Ide, MD  clotrimazole-betamethasone (LOTRISONE) cream Apply topically. 06/21/20   [provider]  cyanocobalamin (,VITAMIN B-12,) 1000 MCG/ML injection Inject 1 mL (1,000 mcg total) into the muscle every 30 (thirty) days. 12/11/17   Dhungel, Nishant, MD  escitalopram (LEXAPRO) 10 MG tablet Take 20 mg by mouth daily. 04/14/20   [provider]  esomeprazole (NEXIUM) 40 MG packet Take 40 mg by mouth daily before breakfast.     [provider]  famotidine (PEPCID) 20 MG tablet  12/05/19   [provider]  feeding supplement, GLUCERNA SHAKE, (GLUCERNA SHAKE) LIQD Take 237 mLs by mouth 3 (three) times daily between meals. 11/14/17   Dhungel, Theda Belfast, MD  fosfomycin (MONUROL) 3 g PACK Take by mouth. 04/13/22   [provider]  furosemide (LASIX) 20 MG tablet Take 60 mg by mouth daily.    [provider]  gabapentin (NEURONTIN) 300 MG capsule Take 1 capsule (300 mg total) by mouth at bedtime. 10/18/14   Meredeth Ide, MD  gabapentin (NEURONTIN) 400 MG capsule Take by mouth. 03/12/22   [provider]  gabapentin (NEURONTIN) 600 MG tablet Take 600 mg by mouth daily. 07/13/20   [provider]  indomethacin (INDOCIN) 25 MG capsule Take 25 mg by mouth 3 (three) times daily. 04/11/22   [provider]  insulin aspart (NOVOLOG) 100 UNIT/ML injection Inject 0-5 Units into the skin at bedtime. 09/12/20   Dahal, Melina Schools, MD  insulin aspart (NOVOLOG) 100 UNIT/ML injection Inject 0-9 Units into the skin 3 (three) times daily with meals. 09/12/20   Lorin Glass, MD  insulin glargine (LANTUS) 100 UNIT/ML injection Inject 0.1 mLs (10 Units total) into the skin at bedtime. 09/12/20   Dahal, Melina Schools, MD  levothyroxine (SYNTHROID, LEVOTHROID) 112 MCG tablet Take 112 mcg by mouth daily.    [provider]  lidocaine (  LIDODERM) 5 % 1 patch daily. 09/05/20   [provider]  lisinopril (ZESTRIL) 2.5 MG tablet Take by mouth. 03/12/22   [provider]  LORazepam (ATIVAN) 0.5 MG tablet Take one tablet by mouth twice daily for anxiety Patient taking differently: Take 0.5 mg by mouth every morning. 11/14/17   Dhungel, Nishant, MD  metoprolol tartrate (LOPRESSOR) 50 MG tablet Take 50 mg by mouth in the morning. Patient takes 50mg  in the morning and 25mg  in the evening    [provider]  Multiple Vitamins-Minerals (MULTIVITAMIN WITH MINERALS) tablet Take 1 tablet  by mouth daily.    [provider]  nitroGLYCERIN (NITROSTAT) 0.4 MG SL tablet Place 0.4 mg under the tongue every 5 (five) minutes as needed for chest pain.    [provider]  [facility] powder Apply 1 application topically 2 (two) times daily. 07/25/20   [provider]  potassium chloride 20 MEQ/15ML (10%) SOLN SMARTSIG:Milliliter(s) By Mouth 03/30/22   [provider]  potassium chloride SA (K-DUR,KLOR-CON) 20 MEQ tablet Take 20 mEq by mouth daily.    [provider]  sennosides-docusate sodium (SENOKOT-S) 8.6-50 MG tablet Take 1 tablet by mouth daily.    [provider]      Allergies    Iodine, Ivp dye [iodinated contrast media], and Penicillins    Review of Systems   Review of Systems  Neurological:  Positive for weakness.    Physical Exam Updated Vital Signs BP 115/71   Pulse 68   Resp (!) 24   Ht 5' (1.524 m)   Wt 73.5 kg   SpO2 94%   BMI 31.65 kg/m  Physical Exam  ED Results / Procedures / Treatments   Labs (all labs ordered are listed, but only abnormal results are displayed) Labs Reviewed  CBC WITH DIFFERENTIAL/PLATELET - Abnormal; Notable for the following components:      Result Value   WBC 18.2 (*)    Neutro Abs 14.6 (*)    Monocytes Absolute 1.4 (*)    Abs Immature Granulocytes 0.09 (*)    All other components within normal limits  COMPREHENSIVE METABOLIC PANEL - Abnormal; Notable for the following components:   Sodium 154 (*)    Potassium 3.4 (*)    Chloride 123 (*)    CO2 19 (*)    Glucose, Bld 371 (*)    BUN 62 (*)    Creatinine, Ser 1.68 (*)    Albumin 3.0 (*)    GFR, Estimated 32 (*)    All other components within normal limits  URINALYSIS, ROUTINE W REFLEX MICROSCOPIC - Abnormal; Notable for the following components:   APPearance CLOUDY (*)    Glucose, UA >=500 (*)    Hgb urine dipstick LARGE (*)    Protein, ur 100 (*)    Leukocytes,Ua LARGE (*)    Bacteria, UA FEW (*)    All other  components within normal limits  CULTURE, BLOOD (SINGLE)  CULTURE, OB URINE  CULTURE, BLOOD (ROUTINE X 2)  CULTURE, BLOOD (ROUTINE X 2)  RAPID URINE DRUG SCREEN, HOSP PERFORMED  LACTIC ACID, PLASMA  LACTIC ACID, PLASMA  TSH  BLOOD GAS, VENOUS  VITAMIN B12  AMMONIA  PROCALCITONIN  HEMOGLOBIN A1C    EKG None  Radiology No results found.  Procedures Procedures  {Document cardiac monitor, telemetry assessment procedure when appropriate:1}  Medications Ordered in ED Medications  cefTRIAXone (ROCEPHIN) 2 g in sodium chloride 0.9 % 100 mL IVPB (has no administration in time range)  sodium chloride 0.45 % bolus 1,000 mL (has no administration in time range)    Followed by  0.45 % sodium chloride infusion (has no administration in time range)  potassium chloride 10 mEq in 100 mL IVPB (has no administration in time range)  insulin aspart (novoLOG) injection 0-9 Units (has no administration in time range)    ED Course/ Medical Decision Making/ A&P   {Patient is hypernatremic,   AKI and UTI.  She will be admitted to medicine Click here for ABCD2, HEART and other calculatorsREFRESH Note before signing :1}                              Medical Decision Making Amount and/or Complexity of Data Reviewed Labs: ordered. Radiology: ordered.  Risk Decision regarding hospitalization.   Febrile illness with urinary tract infection and hypernatremia with AKI  {Document critical care time when appropriate:1} {Document review of labs and clinical decision tools ie heart score, Chads2Vasc2 etc:1}  {Document your independent review of radiology images, and any outside records:1} {Document your discussion with family members, caretakers, and with consultants:1} {Document social determinants of health affecting pt's care:1} {Document your decision making why or why not admission, treatments were needed:1} Final Clinical Impression(s) / ED Diagnoses Final diagnoses:  Acute cystitis with  hematuria  AKI (acute kidney injury) (HCC)    Rx / DC Orders ED Discharge Orders     None

## 2023-06-04 NOTE — ED Notes (Signed)
Potassium X3 had expired. Pharmacy notified for reorder.

## 2023-06-04 NOTE — ED Triage Notes (Signed)
EMS called out to [facility] due to pt's "abnormal labs" and because pt has not been herself for "a day or 2". Pt has hx of 2 strokes in the past, no fever, LKW is unknown as it has been over a day since s/s began. CBG in ED 370.

## 2023-06-04 NOTE — Sepsis Progress Note (Signed)
Sepsis protocol monitored by eLink ?

## 2023-06-05 DIAGNOSIS — G9341 Metabolic encephalopathy: Secondary | ICD-10-CM | POA: Diagnosis not present

## 2023-06-05 DIAGNOSIS — N179 Acute kidney failure, unspecified: Secondary | ICD-10-CM

## 2023-06-05 DIAGNOSIS — E876 Hypokalemia: Secondary | ICD-10-CM | POA: Diagnosis not present

## 2023-06-05 DIAGNOSIS — I639 Cerebral infarction, unspecified: Secondary | ICD-10-CM | POA: Diagnosis not present

## 2023-06-05 LAB — BASIC METABOLIC PANEL
Anion gap: 12 (ref 5–15)
Anion gap: 8 (ref 5–15)
Anion gap: 7 (ref 5–15)
BUN: 52 mg/dL — ABNORMAL HIGH (ref 8–23)
BUN: 45 mg/dL — ABNORMAL HIGH (ref 8–23)
BUN: 41 mg/dL — ABNORMAL HIGH (ref 8–23)
CO2: 18 mmol/L — ABNORMAL LOW (ref 22–32)
CO2: 20 mmol/L — ABNORMAL LOW (ref 22–32)
CO2: 20 mmol/L — ABNORMAL LOW (ref 22–32)
Calcium: 8.4 mg/dL — ABNORMAL LOW (ref 8.9–10.3)
Calcium: 8.3 mg/dL — ABNORMAL LOW (ref 8.9–10.3)
Calcium: 8.3 mg/dL — ABNORMAL LOW (ref 8.9–10.3)
Chloride: 121 mmol/L — ABNORMAL HIGH (ref 98–111)
Chloride: 123 mmol/L — ABNORMAL HIGH (ref 98–111)
Chloride: 124 mmol/L — ABNORMAL HIGH (ref 98–111)
Creatinine, Ser: 1.26 mg/dL — ABNORMAL HIGH (ref 0.44–1.00)
Creatinine, Ser: 1.1 mg/dL — ABNORMAL HIGH (ref 0.44–1.00)
Creatinine, Ser: 1.05 mg/dL — ABNORMAL HIGH (ref 0.44–1.00)
GFR, Estimated: 45 mL/min — ABNORMAL LOW (ref >60)
GFR, Estimated: 53 mL/min — ABNORMAL LOW (ref >60)
GFR, Estimated: 56 mL/min — ABNORMAL LOW (ref >60)
Glucose, Bld: 153 mg/dL — ABNORMAL HIGH (ref 70–99)
Glucose, Bld: 104 mg/dL — ABNORMAL HIGH (ref 70–99)
Glucose, Bld: 115 mg/dL — ABNORMAL HIGH (ref 70–99)
Potassium: 3.9 mmol/L (ref 3.5–5.1)
Potassium: 3.4 mmol/L — ABNORMAL LOW (ref 3.5–5.1)
Potassium: 3.6 mmol/L (ref 3.5–5.1)
Sodium: 151 mmol/L — ABNORMAL HIGH (ref 135–145)
Sodium: 151 mmol/L — ABNORMAL HIGH (ref 135–145)
Sodium: 151 mmol/L — ABNORMAL HIGH (ref 135–145)

## 2023-06-05 LAB — COMPREHENSIVE METABOLIC PANEL
ALT: 48 U/L — ABNORMAL HIGH (ref 0–44)
AST: 61 U/L — ABNORMAL HIGH (ref 15–41)
Albumin: 2.7 g/dL — ABNORMAL LOW (ref 3.5–5.0)
Alkaline Phosphatase: 56 U/L (ref 38–126)
Anion gap: 8 (ref 5–15)
BUN: 56 mg/dL — ABNORMAL HIGH (ref 8–23)
CO2: 21 mmol/L — ABNORMAL LOW (ref 22–32)
Calcium: 8.5 mg/dL — ABNORMAL LOW (ref 8.9–10.3)
Chloride: 123 mmol/L — ABNORMAL HIGH (ref 98–111)
Creatinine, Ser: 1.26 mg/dL — ABNORMAL HIGH (ref 0.44–1.00)
GFR, Estimated: 45 mL/min — ABNORMAL LOW (ref >60)
Glucose, Bld: 124 mg/dL — ABNORMAL HIGH (ref 70–99)
Potassium: 3.9 mmol/L (ref 3.5–5.1)
Sodium: 152 mmol/L — ABNORMAL HIGH (ref 135–145)
Total Bilirubin: 0.5 mg/dL (ref 0.3–1.2)
Total Protein: 6.1 g/dL — ABNORMAL LOW (ref 6.5–8.1)

## 2023-06-05 LAB — GLUCOSE, CAPILLARY
Glucose-Capillary: 100 mg/dL — ABNORMAL HIGH (ref 70–99)
Glucose-Capillary: 110 mg/dL — ABNORMAL HIGH (ref 70–99)
Glucose-Capillary: 114 mg/dL — ABNORMAL HIGH (ref 70–99)
Glucose-Capillary: 127 mg/dL — ABNORMAL HIGH (ref 70–99)
Glucose-Capillary: 137 mg/dL — ABNORMAL HIGH (ref 70–99)
Glucose-Capillary: 370 mg/dL — ABNORMAL HIGH (ref 70–99)
Glucose-Capillary: 85 mg/dL (ref 70–99)

## 2023-06-05 LAB — CBC
HCT: 41.7 % (ref 36.0–46.0)
Hemoglobin: 12.1 g/dL (ref 12.0–15.0)
MCH: 29.2 pg (ref 26.0–34.0)
MCHC: 29 g/dL — ABNORMAL LOW (ref 30.0–36.0)
MCV: 100.5 fL — ABNORMAL HIGH (ref 80.0–100.0)
Platelets: 189 10*3/uL (ref 150–400)
RBC: 4.15 MIL/uL (ref 3.87–5.11)
RDW: 13.5 % (ref 11.5–15.5)
WBC: 18.1 10*3/uL — ABNORMAL HIGH (ref 4.0–10.5)
nRBC: 0 % (ref 0.0–0.2)

## 2023-06-05 MED ORDER — METOPROLOL TARTRATE 50 MG PO TABS
50.0000 mg | ORAL_TABLET | Freq: Every day | ORAL | Status: DC
  Administered 2023-06-06: 75 mg via ORAL
  Administered 2023-06-07: 50 mg via ORAL
  Filled 2023-06-05 (×3): qty 1

## 2023-06-05 MED ORDER — LORAZEPAM 0.5 MG PO TABS: 0.5000 mg | ORAL_TABLET | Freq: Two times a day (BID) | ORAL | Status: DC | PRN

## 2023-06-05 MED ORDER — ESCITALOPRAM OXALATE 10 MG PO TABS
20.0000 mg | ORAL_TABLET | Freq: Every day | ORAL | Status: DC
  Administered 2023-06-06 – 2023-06-08 (×3): 20 mg via ORAL
  Filled 2023-06-05 (×3): qty 2

## 2023-06-05 MED ORDER — GABAPENTIN 400 MG PO CAPS
400.0000 mg | ORAL_CAPSULE | Freq: Every day | ORAL | Status: DC
  Administered 2023-06-06 – 2023-06-10 (×5): 400 mg via ORAL
  Filled 2023-06-05 (×5): qty 1

## 2023-06-05 MED ORDER — CLOPIDOGREL BISULFATE 75 MG PO TABS
75.0000 mg | ORAL_TABLET | Freq: Every day | ORAL | Status: DC
  Administered 2023-06-06 – 2023-06-10 (×5): 75 mg via ORAL
  Filled 2023-06-05 (×5): qty 1

## 2023-06-05 MED ORDER — GABAPENTIN 300 MG PO CAPS
300.0000 mg | ORAL_CAPSULE | Freq: Every day | ORAL | Status: DC
  Administered 2023-06-05 – 2023-06-09 (×5): 300 mg via ORAL
  Filled 2023-06-05 (×5): qty 1

## 2023-06-05 MED ORDER — METOPROLOL TARTRATE 25 MG PO TABS
25.0000 mg | ORAL_TABLET | Freq: Every day | ORAL | Status: DC
  Administered 2023-06-06: 25 mg via ORAL
  Filled 2023-06-05 (×2): qty 1

## 2023-06-05 MED ORDER — CHLORHEXIDINE GLUCONATE CLOTH 2 % EX PADS
6.0000 | MEDICATED_PAD | Freq: Every day | CUTANEOUS | Status: DC
  Administered 2023-06-05 – 2023-06-10 (×6): 6 via TOPICAL

## 2023-06-05 MED ORDER — LORAZEPAM 0.5 MG PO TABS: 0.5000 mg | ORAL_TABLET | Freq: Two times a day (BID) | ORAL | Status: DC

## 2023-06-05 MED ORDER — LEVOTHYROXINE SODIUM 50 MCG PO TABS
50.0000 ug | ORAL_TABLET | Freq: Every day | ORAL | Status: DC
  Administered 2023-06-06 – 2023-06-10 (×5): 50 ug via ORAL
  Filled 2023-06-05 (×5): qty 1

## 2023-06-05 NOTE — Progress Notes (Signed)
OT Cancellation Note  Patient Details Name: Dawn Ho MRN: [id number] DOB: 1951/06/03   Cancelled Treatment:    Reason Eval/Treat Not Completed: Medical issues which prohibited therapy;Fatigue/lethargy limiting ability to participate. Pt's nurse suggested holding on pt's evaluation due to pt's high sodium and lack of responsiveness. Will attempt evaluation later as time permits.   Raela Bohl OT, MOT   Danie Chandler 06/05/2023, 11:03 AM

## 2023-06-05 NOTE — [place] FL2 (Signed)
[place] MEDICAID FL2 LEVEL OF CARE FORM     IDENTIFICATION  Patient Name: Dawn Ho Birthdate: Mar 16, 1951 Sex: female Admission Date (Current Location): 06/04/2023  [facility] and [place] Number:  Producer, television/film/video and Address:  [facility], [address], [place], [place] [address]      Provider Number: [id number]  Attending Physician Name and Address:  Kathlen Mody, MD  Relative Name and Phone Number:       Current Level of Care: Hospital Recommended Level of Care: Skilled Nursing Facility Prior Approval Number:    Date Approved/Denied:   PASRR Number:    Discharge Plan: SNF    Current Diagnoses: Patient Active Problem List   Diagnosis Date Noted   Acute metabolic encephalopathy 06/04/2023   Uncontrolled type 2 diabetes mellitus with hyperglycemia, with long-term current use of insulin (HCC) 06/04/2023   Hematuria 06/04/2023   Hypernatremia 06/04/2023   AKI (acute kidney injury) (HCC) 06/04/2023   Nephrolithiasis 04/18/2022   Closed right hip fracture, initial encounter (HCC) 09/09/2020   Hypomagnesemia 11/12/2017   Aspiration into airway    Acute respiratory distress    Lung crackles    Ileus (HCC)    Abdominal distension    Leukocytosis    Diarrhea    UTI (urinary tract infection) 10/31/2017   Hypokalemia 10/31/2017   Cryptogenic stroke (HCC) 01/12/2015   Spastic hemiparesis (HCC) 01/12/2015   HLD (hyperlipidemia)    History of recent stroke    Accelerated hypertension    Acute cystitis without hematuria    Tachycardia    Stroke (HCC) 11/17/2014   Left-sided weakness 11/17/2014   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)    CVA (cerebral vascular accident) (HCC) 10/14/2014   TIA (transient ischemic attack) 10/13/2014   Chest pain 08/07/2011   Fibromyalgia 08/07/2011   Costochondritis 08/07/2011   Diabetes mellitus (HCC) 08/07/2011   Depression with anxiety 08/07/2011   Obesity 08/07/2011    History of TIAs 08/07/2011   Hyperlipidemia 08/07/2011    Orientation RESPIRATION BLADDER Height & Weight     Self, Place  O2 (3L nasal cannula) Incontinent, Indwelling catheter Weight: 162 lb 0.6 oz (73.5 kg) Height:  5' (152.4 cm)  BEHAVIORAL SYMPTOMS/MOOD NEUROLOGICAL BOWEL NUTRITION STATUS      Incontinent Diet (see d/c summary)  AMBULATORY STATUS COMMUNICATION OF NEEDS Skin   Extensive Assist Verbally Normal                       Personal Care Assistance Level of Assistance  Bathing, Feeding, Dressing Bathing Assistance: Limited assistance Feeding assistance: Independent Dressing Assistance: Limited assistance     Functional Limitations Info  Sight, Hearing, Speech Sight Info: Adequate Hearing Info: Adequate Speech Info: Adequate    SPECIAL CARE FACTORS FREQUENCY                       Contractures Contractures Info: Not present    Additional Factors Info  Code Status, Allergies Code Status Info: full code Allergies Info: penicillins, Iodinated Contrast Media           Current Medications (06/05/2023):  This is the current hospital active medication list Current Facility-Administered Medications  Medication Dose Route Frequency Provider Last Rate Last Admin   Chlorhexidine Gluconate Cloth 2 % PADS 6 each  6 each Topical Daily Almon Hercules, MD   6 each at 06/05/23 0957   clopidogrel (PLAVIX) tablet 75 mg  75 mg Oral Daily Kathlen Mody, MD       escitalopram (LEXAPRO) tablet 20 mg  20 mg Oral Daily Kathlen Mody, MD       gabapentin (NEURONTIN) capsule 300 mg  300 mg Oral QHS Kathlen Mody, MD       [START ON 06/06/2023] gabapentin (NEURONTIN) capsule 400 mg  400 mg Oral Daily Kathlen Mody, MD       heparin injection 5,000 Units  5,000 Units Subcutaneous Q8H Candelaria Stagers T, MD   5,000 Units at 06/05/23 1527   insulin aspart (novoLOG) injection 0-9 Units  0-9 Units Subcutaneous Q4H Candelaria Stagers T, MD   9 Units at 06/05/23 0956   levothyroxine  (SYNTHROID) tablet 50 mcg  50 mcg Oral Daily Kathlen Mody, MD       LORazepam (ATIVAN) tablet 0.5 mg  0.5 mg Oral BID PRN Kathlen Mody, MD       meropenem (MERREM) 1 g in sodium chloride 0.9 % 100 mL IVPB  1 g Intravenous Q12H Candelaria Stagers T, MD   Stopped at 06/05/23 0622   metoprolol tartrate (LOPRESSOR) tablet 25 mg  25 mg Oral Q supper Kathlen Mody, MD       [START ON 06/06/2023] metoprolol tartrate (LOPRESSOR) tablet 50 mg  50 mg Oral Q breakfast Kathlen Mody, MD       ondansetron (ZOFRAN) tablet 4 mg  4 mg Oral Q6H PRN Gonfa, Taye T, MD       Or   ondansetron (ZOFRAN) injection 4 mg  4 mg Intravenous Q6H PRN Almon Hercules, MD         Discharge Medications: Please see discharge summary for a list of discharge medications.  Relevant Imaging Results:  Relevant Lab Results:   Additional Information SSN [id number]  Erin Sons, LCSW

## 2023-06-05 NOTE — Progress Notes (Signed)
SLP Cancellation Note  Patient Details Name: Dawn Ho MRN: [id number] DOB: 07-04-1951   Cancelled treatment:       Reason Eval/Treat Not Completed: Fatigue/lethargy limiting ability to participate. PT is not appropriate for PO trials for BSE or alert enough for SLE at this time. Our service will re-attempt tomorrow, thank you  Dawn Ho, CCC-SLP Speech Language Pathologist    [name] 06/05/2023, 3:59 PM

## 2023-06-05 NOTE — Progress Notes (Signed)
   06/05/23 1325  TOC Brief Assessment  Insurance and Status Reviewed  Patient has primary care physician Yes  Home environment has been reviewed LTC at [facility]  Prior level of function: needs assistance  Prior/Current Home Services No current home services  Social Determinants of Health Reivew SDOH reviewed no interventions necessary  Readmission risk has been reviewed Yes  Transition of care needs transition of care needs identified, TOC will continue to follow   CSW confirmed with [facility] that pt is a LTC resident of thiers and can return back on on DC. TOC will continue to follow

## 2023-06-05 NOTE — Inpatient Diabetes Management (Signed)
Inpatient Diabetes Program Recommendations  AACE/ADA: New Consensus Statement on Inpatient Glycemic Control (2015)  Target Ranges:  Prepandial:   less than 140 mg/dL      Peak postprandial:   less than 180 mg/dL (1-2 hours)      Critically ill patients:  140 - 180 mg/dL   Lab Results  Component Value Date   GLUCAP 137 (H) 06/05/2023   HGBA1C 8.3 (H) 06/04/2023    Review of Glycemic Control  Latest Reference Range & Units 06/04/23 23:19 06/05/23 03:24 06/05/23 07:39 06/05/23 11:13  Glucose-Capillary 70 - 99 mg/dL 161 (H) 096 (H) 045 (H) 137 (H)   Diabetes history: DM 2 Outpatient Diabetes medications:  Lantus 15 units daily Humalog 0-12 units tid with meals  Current orders for Inpatient glycemic control:  Novolog 0-9 units q 4 hours  Inpatient Diabetes Program Recommendations:    Blood sugars improved today with q 4 hour correction.  Will follow.   Thanks,  Beryl Meager, RN, BC-ADM Inpatient Diabetes Coordinator Pager [phone]  (8a-5p)

## 2023-06-05 NOTE — Progress Notes (Signed)
PT Cancellation Note  Patient Details Name: Dawn Ho MRN: [id number] DOB: 09-10-1950   Cancelled Treatment:    Reason Eval/Treat Not Completed: Fatigue/lethargy limiting ability to participate.  Patient unable to participate with therapy due to lethargy - will check back tomorrow.  3:58 PM, 06/05/23 Ocie Bob, MPT Physical Therapist with [facility] 336 [phone] office [phone] mobile phone

## 2023-06-05 NOTE — Progress Notes (Addendum)
[facility]                                                                               [name], is a 72 y.o. female, DOB - April 21, 1951, QMV:[id number] Admit date - 06/04/2023    Outpatient Primary MD for the patient is Eden Emms, Hali Marry, MD  LOS - 1  days    Brief summary    Dawn Ho is a 72 y.o. female with PMH of dementia, CVA with left hemiparesis, ESBL infection, IDDM-2, HTN, HLD, anxiety, depression and fibromyalgia brought to ED by EMS due to "abnormal labs" and altered mental status.  As per the patient's family Normally, she is awake, alert and oriented to self and person but not place and time. They recently started assistance with feeding.  Patient's son received a call from nursing home stating that they are placing a PICC line for hydration and IV antibiotics . She was febrile ,  and  UA with large LE, large Hgb, few bacteria, negative nitrite and > 50 RBC.     Assessment & Plan    Assessment and Plan:  SIRS probably secondary to urinary tract infection Patient febrile on admission blood pressures were soft. Had leukocytosis of 18,000 and In AKI. With a history of ESBL infection She was started on IV meropenem, follow blood cultures and urine cultures Lactic acid normal.    Acute metabolic encephalopathy in the setting  of advanced dementia, CVA with left hemiparesis. Patient is alert but remains confused, not following commands. CT head does not show any any acute changes or stroke. It shows chronic ischemic changes.  Old left basal ganglia/radiating white matter tract infarction. Old subcortical infarction at the right parietal vertex. TSH within normal limits.  Vitamin B12 levels are elevated at 1048 ABG shows pH of 7.38   Hypernatremia, AKI with non-anion gap metabolic acidosis; Sodium slowly improving currently at 151. Creatinine on admission of 1.68-1.26 today   Hypokalemia Replaced and repeat level is 3.9  History of CVA  with left hemiparesis patient on Plavix at home continue the same  Leukocytosis Persistent continue to monitor. Procalcitonin is less than 0.10   Insulin-dependent diabetes mellitus, with hyperglycemia Hemoglobin A1c at 8.3 Continue with sliding scale insulin. Gabapentin for diabetic neuropathy.   CBG (last 3)  Recent Labs    06/05/23 0324 06/05/23 0739 06/05/23 1113  GLUCAP 100* 127* 137*       Hypertension Blood pressure parameters have normalized.   History of anxiety and depression Continue to monitor   Hypothyroidism Continue with Synthroid 50 m CBG daily   Therapy evaluations have been ordered Palliative care will be consulted, for goals of care if fails to improve.   Nutrition: currently NPO, will start with sips with meds and clears.  SLP evaluation ordered.     Estimated body mass index is 31.65 kg/m as calculated from the following:   Height as of this encounter: 5' (1.524 m).   Weight as of this encounter: 73.5 kg.  Code Status: full code DVT Prophylaxis:  heparin injection 5,000 Units Start: 06/04/23 2200   Level of Care: Level of care: Telemetry Family Communication: none at  bedside.   Disposition Plan:     Remains inpatient appropriate:  IV antibiotics and IV fluids.   Procedures:  None.   Consultants:   SLP and therapy evaluations.   Antimicrobials:   Anti-infectives (From admission, onward)    Start     Dose/Rate Route Frequency Ordered Stop   06/04/23 1630  meropenem (MERREM) 1 g in sodium chloride 0.9 % 100 mL IVPB        1 g 200 mL/hr over 30 Minutes Intravenous Every 12 hours 06/04/23 1603     06/04/23 1430  cefTRIAXone (ROCEPHIN) 2 g in sodium chloride 0.9 % 100 mL IVPB  Status:  Discontinued        2 g 200 mL/hr over 30 Minutes Intravenous Every 24 hours 06/04/23 1425 06/04/23 1538        Medications  Scheduled Meds:  Chlorhexidine Gluconate Cloth  6 each Topical Daily   heparin  5,000 Units Subcutaneous Q8H    insulin aspart  0-9 Units Subcutaneous Q4H   Continuous Infusions:  sodium chloride 125 mL/hr at 06/05/23 1333   meropenem (MERREM) IV Stopped (06/05/23 0622)   PRN Meds:.ondansetron **OR** ondansetron (ZOFRAN) IV    Subjective:   Dawn Ho was seen and examined today. Pt appears comfortable, is not following commands.   Objective:   Vitals:   06/04/23 2143 06/05/23 0233 06/05/23 0551 06/05/23 1338  BP: 125/77 113/64 (!) 106/56 117/67  Pulse: 73     Resp: 15     Temp: (!) 97.5 F (36.4 C) 97.6 F (36.4 C) (!) 97.5 F (36.4 C) 98.3 F (36.8 C)  TempSrc: Axillary Axillary Axillary Oral  SpO2: 96%     Weight:      Height:        Intake/Output Summary (Last 24 hours) at 06/05/2023 1358 Last data filed at 06/05/2023 1228 Gross per 24 hour  Intake 3237.48 ml  Output 450 ml  Net 2787.48 ml   Filed Weights   06/04/23 1247  Weight: 73.5 kg     Exam General: Alert , comfortable, not in distress. Cardiovascular: S1 S2 auscultated, no murmurs, RRR Respiratory: Clear to auscultation bilaterally, no wheezing, rales or rhonchi Gastrointestinal: Soft, nontender, nondistended, + bowel sounds Ext: no pedal edema bilaterally Neuro: alert, not oriented, not following commands.  Skin: No rashes Psych: unable to assess.    Data Reviewed:  I have personally reviewed following labs and imaging studies   CBC Lab Results  Component Value Date   WBC 18.1 (H) 06/05/2023   RBC 4.15 06/05/2023   HGB 12.1 06/05/2023   HCT 41.7 06/05/2023   MCV 100.5 (H) 06/05/2023   MCH 29.2 06/05/2023   PLT 189 06/05/2023   MCHC 29.0 (L) 06/05/2023   RDW 13.5 06/05/2023   LYMPHSABS 2.1 06/04/2023   MONOABS 1.4 (H) 06/04/2023   EOSABS 0.0 06/04/2023   BASOSABS 0.0 06/04/2023     Last metabolic panel Lab Results  Component Value Date   NA 151 (H) 06/05/2023   K 3.9 06/05/2023   CL 121 (H) 06/05/2023   CO2 18 (L) 06/05/2023   BUN 52 (H) 06/05/2023   CREATININE 1.26 (H) 06/05/2023    GLUCOSE 153 (H) 06/05/2023   GFRNONAA 45 (L) 06/05/2023   GFRAA 52 (L) 02/21/2019   CALCIUM 8.4 (L) 06/05/2023   PHOS 2.9 11/11/2017   PROT 6.1 (L) 06/05/2023   ALBUMIN 2.7 (L) 06/05/2023   BILITOT 0.5 06/05/2023   ALKPHOS 56 06/05/2023   AST 61 (H)  06/05/2023   ALT 48 (H) 06/05/2023   ANIONGAP 12 06/05/2023    CBG (last 3)  Recent Labs    06/05/23 0324 06/05/23 0739 06/05/23 1113  GLUCAP 100* 127* 137*      Coagulation Profile: No results for input(s): "INR", "PROTIME" in the last 168 hours.   Radiology Studies: CT HEAD WO CONTRAST ( )  Result Date: 06/04/2023 CLINICAL DATA:  Mental status change of unknown cause EXAM: CT HEAD WITHOUT CONTRAST TECHNIQUE: Contiguous axial images were obtained from the base of the skull through the vertex without intravenous contrast. RADIATION DOSE REDUCTION: This exam was performed according to the departmental dose-optimization program which includes automated exposure control, adjustment of the mA and/or kV according to patient size and/or use of iterative reconstruction technique. COMPARISON:  02/21/2019 FINDINGS: Brain: Age related volume loss. Chronic small-vessel ischemic changes affect the pons. No focal cerebellar insult. Generalized atrophy of the cerebral hemispheres. Chronic small-vessel ischemic changes throughout the white matter. Old left basal ganglia/radiating white matter tract infarction. Old subcortical infarction at the right parietal vertex. No sign of acute infarction, mass lesion, hemorrhage, hydrocephalus or extra-axial collection. Vascular: There is atherosclerotic calcification of the major vessels at the base of the brain. Skull: Negative Sinuses/Orbits: Clear/normal Other: None IMPRESSION: No acute finding by CT. Atrophy and chronic small-vessel ischemic changes as outlined above. Old left basal ganglia/radiating white matter tract infarction. Old subcortical infarction at the right parietal vertex. Electronically  Signed   By: Paulina Fusi M.D.   On: 06/04/2023 18:17   DG Chest Port 1 View  Result Date: 06/04/2023 CLINICAL DATA:  Weakness EXAM: PORTABLE CHEST 1 VIEW COMPARISON:  XR 09/09/20. FINDINGS: Film is rotated to the right. Underinflation. No consolidation, pneumothorax or effusion. No edema. Normal cardiopericardial silhouette. Old right-sided rib fractures. Loop recorder overlying the midthorax. Calcified aorta. Overlapping cardiac leads. IMPRESSION: Rotated radiograph. Loop recorder. No acute cardiopulmonary disease. Electronically Signed   By: Karen Kays M.D.   On: 06/04/2023 15:19       Kathlen Mody M.D. [facility] 06/05/2023, 1:58 PM  Available via Epic secure chat 7am-7pm After 7 pm, please refer to night coverage provider listed on amion.

## 2023-06-05 NOTE — Plan of Care (Signed)
  Problem: Fluid Volume: Goal: Ability to maintain a balanced intake and output will improve Outcome: Progressing   Problem: Nutritional: Goal: Maintenance of adequate nutrition will improve Outcome: Not Progressing   Problem: Skin Integrity: Goal: Risk for impaired skin integrity will decrease Outcome: Not Progressing

## 2023-06-06 DIAGNOSIS — I639 Cerebral infarction, unspecified: Secondary | ICD-10-CM | POA: Diagnosis not present

## 2023-06-06 DIAGNOSIS — N179 Acute kidney failure, unspecified: Secondary | ICD-10-CM | POA: Diagnosis not present

## 2023-06-06 DIAGNOSIS — E876 Hypokalemia: Secondary | ICD-10-CM | POA: Diagnosis not present

## 2023-06-06 DIAGNOSIS — G9341 Metabolic encephalopathy: Secondary | ICD-10-CM | POA: Diagnosis not present

## 2023-06-06 LAB — CBC WITH DIFFERENTIAL/PLATELET
Abs Immature Granulocytes: 0.04 10*3/uL (ref 0.00–0.07)
Basophils Absolute: 0 10*3/uL (ref 0.0–0.1)
Basophils Relative: 0 %
Eosinophils Absolute: 0.3 10*3/uL (ref 0.0–0.5)
Eosinophils Relative: 3 %
HCT: 36.8 % (ref 36.0–46.0)
Hemoglobin: 11.6 g/dL — ABNORMAL LOW (ref 12.0–15.0)
Immature Granulocytes: 0 %
Lymphocytes Relative: 20 %
Lymphs Abs: 2 10*3/uL (ref 0.7–4.0)
MCH: 30.4 pg (ref 26.0–34.0)
MCHC: 31.5 g/dL (ref 30.0–36.0)
MCV: 96.6 fL (ref 80.0–100.0)
Monocytes Absolute: 0.6 10*3/uL (ref 0.1–1.0)
Monocytes Relative: 6 %
Neutro Abs: 7.3 10*3/uL (ref 1.7–7.7)
Neutrophils Relative %: 71 %
Platelets: 159 10*3/uL (ref 150–400)
RBC: 3.81 MIL/uL — ABNORMAL LOW (ref 3.87–5.11)
RDW: 13.5 % (ref 11.5–15.5)
WBC: 10.3 10*3/uL (ref 4.0–10.5)
nRBC: 0 % (ref 0.0–0.2)

## 2023-06-06 LAB — BASIC METABOLIC PANEL
Anion gap: 6 (ref 5–15)
BUN: 41 mg/dL — ABNORMAL HIGH (ref 8–23)
CO2: 20 mmol/L — ABNORMAL LOW (ref 22–32)
Calcium: 8.2 mg/dL — ABNORMAL LOW (ref 8.9–10.3)
Chloride: 125 mmol/L — ABNORMAL HIGH (ref 98–111)
Creatinine, Ser: 1.15 mg/dL — ABNORMAL HIGH (ref 0.44–1.00)
GFR, Estimated: 50 mL/min — ABNORMAL LOW (ref >60)
Glucose, Bld: 132 mg/dL — ABNORMAL HIGH (ref 70–99)
Potassium: 3.7 mmol/L (ref 3.5–5.1)
Sodium: 151 mmol/L — ABNORMAL HIGH (ref 135–145)

## 2023-06-06 LAB — GLUCOSE, CAPILLARY
Glucose-Capillary: 125 mg/dL — ABNORMAL HIGH (ref 70–99)
Glucose-Capillary: 152 mg/dL — ABNORMAL HIGH (ref 70–99)
Glucose-Capillary: 173 mg/dL — ABNORMAL HIGH (ref 70–99)
Glucose-Capillary: 225 mg/dL — ABNORMAL HIGH (ref 70–99)
Glucose-Capillary: 226 mg/dL — ABNORMAL HIGH (ref 70–99)
Glucose-Capillary: 301 mg/dL — ABNORMAL HIGH (ref 70–99)

## 2023-06-06 MED ORDER — DEXTROSE 5 % IV SOLN: INTRAVENOUS | Status: AC

## 2023-06-06 MED ORDER — NYSTATIN 100000 UNIT/GM EX POWD
Freq: Two times a day (BID) | CUTANEOUS | Status: DC
  Filled 2023-06-06: qty 15

## 2023-06-06 NOTE — Plan of Care (Signed)
  Problem: Acute Rehab PT Goals(only PT should resolve) Goal: Pt will Roll Supine to Side Outcome: Progressing Goal: Pt Will Go Supine/Side To Sit Outcome: Progressing Goal: Pt Will Go Sit To Supine/Side Outcome: Progressing Flowsheets (Taken 06/06/2023 1436) Pt will go Sit to Supine/Side: with moderate assist Goal: Patient Will Perform Sitting Balance Outcome: Progressing Flowsheets (Taken 06/06/2023 1436) Patient will perform sitting balance: with moderate assist Goal: Patient Will Transfer Sit To/From Stand Outcome: Progressing Goal: Pt Will Transfer Bed To Chair/Chair To Bed Outcome: Progressing Flowsheets (Taken 06/06/2023 1436) Pt will Transfer Bed to Chair/Chair to Bed: with mod assist Goal: Pt Will Perform Standing Balance Or Pre-Gait Outcome: Progressing Flowsheets (Taken 06/06/2023 1436) Pt will perform standing balance or pre-gait: with moderate assist   2:37 PM, 06/06/23 Ocie Bob, MPT Physical Therapist with [facility] 336 [phone] office [phone] mobile phone

## 2023-06-06 NOTE — Evaluation (Signed)
Physical Therapy Evaluation Patient Details Name: Dawn Ho MRN: [id number] DOB: [birth year] Today's Date: 06/06/2023  History of Present Illness  Dawn Ho is a 72 y.o. female with PMH of dementia, CVA with left hemiparesis, ESBL infection, IDDM-2, HTN, HLD, anxiety, depression and fibromyalgia brought to ED by EMS due to "abnormal labs" and altered mental status.      Patient with advanced dementia and encephalopathy.  History provided by patient's son and daughter at bedside.  Per patient's son, patient's niece saw patient 4 days ago.  At that time, she was sleepy than usual.  Normally, she is awake, alert and oriented to self and person but not place and time.  Able to push herself around with her wheelchair.  She was not eating and drinking well.  They recently started assistance with feeding.  Patient's son received a call from nursing home stating that they are placing a PICC line for hydration and IV antibiotics.  No report of respiratory or GI symptoms.   Clinical Impression  Patient demonstrates slow labored movement for sitting up at bedside with limited use of BUE/LE due to weakness, unable to maintain sitting balance due frequent left lateral leaning and unable to attempt sit to stands or transferring to chair due to weakness.  Patient required Max assist to reposition when when put back to bed.  Patient will benefit from continued skilled physical therapy in hospital and recommended venue below to increase strength, balance, endurance for safe ADLs and gait.          If plan is discharge home, recommend the following: A lot of help with walking and/or transfers;A lot of help with bathing/dressing/bathroom;Help with stairs or ramp for entrance;Assistance with cooking/housework   Can travel by private vehicle   No    Equipment Recommendations None recommended by PT  Recommendations for Other Services       Functional Status Assessment Patient has had a recent decline in  their functional status and demonstrates the ability to make significant improvements in function in a reasonable and predictable amount of time.     Precautions / Restrictions Precautions Precautions: Fall Restrictions Weight Bearing Restrictions: No      Mobility  Bed Mobility Overal bed mobility: Needs Assistance Bed Mobility: Supine to Sit, Sit to Supine     Supine to sit: Max assist Sit to supine: Max assist   General bed mobility comments: slow labored movment with limited use of  BUE/LE due to weakness    Transfers                        Ambulation/Gait                  Stairs            Wheelchair Mobility     Tilt Bed    Modified Rankin (Stroke Patients Only)       Balance Overall balance assessment: Needs assistance Sitting-balance support: Feet supported, Bilateral upper extremity supported Sitting balance-Leahy Scale: Poor Sitting balance - Comments: seated at EOB Postural control: Left lateral lean                                   Pertinent Vitals/Pain Pain Assessment Faces Pain Scale: Hurts little more Pain Location: BLE with movement Pain Descriptors / Indicators: Sore, Grimacing Pain Intervention(s): Limited activity within patient's tolerance, Monitored during session,  Repositioned    Home Living Family/patient expects to be discharged to:: Skilled nursing facility                        Prior Function Prior Level of Function : Needs assist       Physical Assist : Mobility (physical);ADLs (physical) Mobility (physical): Bed mobility;Gait;Transfers;Stairs   Mobility Comments: Assisted transfers, uses w/c for mobility ADLs Comments: Assisted by SNF staff     Extremity/Trunk Assessment   Upper Extremity Assessment Upper Extremity Assessment: Defer to OT evaluation    Lower Extremity Assessment Lower Extremity Assessment: Generalized weakness    Cervical / Trunk  Assessment Cervical / Trunk Assessment: Kyphotic  Communication   Communication Communication: Other (comment) (non-verbal)  Cognition Arousal: Alert Behavior During Therapy: Flat affect Overall Cognitive Status: History of cognitive impairments - at baseline                                          General Comments      Exercises     Assessment/Plan    PT Assessment Patient needs continued PT services  PT Problem List Decreased strength;Decreased activity tolerance;Decreased balance;Decreased mobility       PT Treatment Interventions DME instruction;Functional mobility training;Therapeutic activities;Therapeutic exercise;Balance training;Wheelchair mobility training;Patient/family education    PT Goals (Current goals can be found in the Care Plan section)  Acute Rehab PT Goals Patient Stated Goal: not stated PT Goal Formulation: With patient Time For Goal Achievement: 06/20/23 Potential to Achieve Goals: Fair    Frequency Min 2X/week     Co-evaluation               AM-PAC PT "6 Clicks" Mobility  Outcome Measure Help needed turning from your back to your side while in a flat bed without using bedrails?: A Lot Help needed moving from lying on your back to sitting on the side of a flat bed without using bedrails?: A Lot Help needed moving to and from a bed to a chair (including a wheelchair)?: Total Help needed standing up from a chair using your arms (e.g., wheelchair or bedside chair)?: Total Help needed to walk in hospital room?: Total Help needed climbing 3-5 steps with a railing? : Total 6 Click Score: 8    End of Session   Activity Tolerance: Patient tolerated treatment well;Patient limited by fatigue Patient left: in bed;with call bell/phone within reach Nurse Communication: Mobility status PT Visit Diagnosis: Unsteadiness on feet (R26.81);Other abnormalities of gait and mobility (R26.89);Muscle weakness (generalized) (M62.81)     Time: 5784-6962 PT Time Calculation (min) (ACUTE ONLY): 17 min   Charges:   PT Evaluation $PT Eval Low Complexity: 1 Low PT Treatments $Therapeutic Activity: 8-22 mins PT General Charges $$ ACUTE PT VISIT: 1 Visit         2:30 PM, 06/06/23 Ocie Bob, MPT Physical Therapist with [facility] [phone] office [phone] mobile phone

## 2023-06-06 NOTE — Progress Notes (Signed)
[facility]                                                                               Dawn Ho, is a 72 y.o. female, DOB - [birth year], UVO:[id number] Admit date - 06/04/2023    Outpatient Primary MD for the patient is Eden Emms, Hali Marry, MD  LOS - 2  days    Brief summary    Dawn Ho is a 72 y.o. female with PMH of dementia, CVA with left hemiparesis, ESBL infection, IDDM-2, HTN, HLD, anxiety, depression and fibromyalgia brought to ED by EMS due to "abnormal labs" and altered mental status.  As per the patient's family Normally, she is awake, alert and oriented to self and person but not place and time. They recently started assistance with feeding.  Patient's son received a call from nursing home stating that they are placing a PICC line for hydration and IV antibiotics . She was febrile ,  and  UA with large LE, large Hgb, few bacteria, negative nitrite and > 50 RBC.     Assessment & Plan    Assessment and Plan:  SIRS probably secondary to urinary tract infection Patient febrile on admission blood pressures were soft. Had leukocytosis of 18,000 and In AKI. With a history of ESBL infection She was started on IV meropenem, follow blood cultures and urine cultures Lactic acid normal. She is more alert this morning and was able to say her name. She remains afebrile .  Wbc count normalized.     Acute metabolic encephalopathy in the setting  of advanced dementia, CVA with left hemiparesis. Patient is alert but remains confused, not following commands. CT head does not show any any acute changes or stroke. It shows chronic ischemic changes.  Old left basal ganglia/radiating white matter tract infarction. Old subcortical infarction at the right parietal vertex. TSH within normal limits.  Vitamin B12 levels are elevated at 1048 ABG shows pH of 7.38 She ismore alert today and oriented to person today.    Hypernatremia, AKI with non-anion gap metabolic  acidosis; Sodium slowly improving currently at 151. Creatinine on admission of 1.68-1.26 to 1.15.    Hypokalemia Replaced , repeat levels wnl.   History of CVA with left hemiparesis patient on Plavix at home continue the same  Leukocytosis Resolved.  Procalcitonin is less than 0.10   Insulin-dependent diabetes mellitus, with hyperglycemia Hemoglobin A1c at 8.3 Continue with sliding scale insulin. Gabapentin for diabetic neuropathy.   CBG (last 3)  Recent Labs    06/06/23 0353 06/06/23 0731 06/06/23 1113  GLUCAP 125* 152* 173*       Hypertension Blood pressure parameters  are optimal.    History of anxiety and depression Continue to monitor   Hypothyroidism Continue with Synthroid 50 m CBG daily   Therapy evaluations have been ordered Palliative care will be consulted, for goals of care if fails to improve.   Nutrition:  diet changed to soft.  SLP evaluation ordered.   Mild anemia of chronic disease:  Hemoglobin around 11.     Estimated body mass index is 31.65 kg/m as calculated from the following:   Height as of this encounter:  5' (1.524 m).   Weight as of this encounter: 73.5 kg.  Code Status: full code DVT Prophylaxis:  heparin injection 5,000 Units Start: 06/04/23 2200   Level of Care: Level of care: Telemetry Family Communication: none at bedside.   Disposition Plan:     Remains inpatient appropriate:  IV antibiotics and IV fluids.   Procedures:  None.   Consultants:   SLP and therapy evaluations.   Antimicrobials:   Anti-infectives (From admission, onward)    Start     Dose/Rate Route Frequency Ordered Stop   06/04/23 1630  meropenem (MERREM) 1 g in sodium chloride 0.9 % 100 mL IVPB        1 g 200 mL/hr over 30 Minutes Intravenous Every 12 hours 06/04/23 1603     06/04/23 1430  cefTRIAXone (ROCEPHIN) 2 g in sodium chloride 0.9 % 100 mL IVPB  Status:  Discontinued        2 g 200 mL/hr over 30 Minutes Intravenous Every 24 hours  06/04/23 1425 06/04/23 1538        Medications  Scheduled Meds:  Chlorhexidine Gluconate Cloth  6 each Topical Daily   clopidogrel  75 mg Oral Daily   escitalopram  20 mg Oral Daily   gabapentin  300 mg Oral QHS   gabapentin  400 mg Oral Daily   heparin  5,000 Units Subcutaneous Q8H   insulin aspart  0-9 Units Subcutaneous Q4H   levothyroxine  50 mcg Oral Daily   metoprolol tartrate  25 mg Oral Q supper   metoprolol tartrate  50 mg Oral Q breakfast   Continuous Infusions:  dextrose 75 mL/hr at 06/06/23 1122   meropenem (MERREM) IV 1 g (06/06/23 0731)   PRN Meds:.LORazepam, ondansetron **OR** ondansetron (ZOFRAN) IV    Subjective:   Dawn Ho was seen and examined today. Able to say her name and follow simple commands.  Denies any pain.   Objective:   Vitals:   06/05/23 0551 06/05/23 1338 06/05/23 2121 06/06/23 0352  BP: (!) 106/56 117/67 115/66 127/71  Pulse:   83 95  Resp:   18 16  Temp: (!) 97.5 F (36.4 C) 98.3 F (36.8 C)  98.9 F (37.2 C)  TempSrc: Axillary Oral    SpO2:   98% 98%  Weight:      Height:        Intake/Output Summary (Last 24 hours) at 06/06/2023 1354 Last data filed at 06/06/2023 0300 Gross per 24 hour  Intake 0 ml  Output 1200 ml  Net -1200 ml   Filed Weights   06/04/23 1247  Weight: 73.5 kg     Exam General exam: Appears calm and comfortable  Respiratory system: Clear to auscultation. Respiratory effort normal. Cardiovascular system: S1 & S2 heard, RRR. No JVD,  Gastrointestinal system: Abdomen is nondistended, soft and nontender.  Central nervous system: Alert and oriented to person only.  Extremities: no edema.  Skin: No rashes, Psychiatry:  Mood & affect appropriate.    Data Reviewed:  I have personally reviewed following labs and imaging studies   CBC Lab Results  Component Value Date   WBC 10.3 06/06/2023   RBC 3.81 (L) 06/06/2023   HGB 11.6 (L) 06/06/2023   HCT 36.8 06/06/2023   MCV 96.6 06/06/2023   MCH  30.4 06/06/2023   PLT 159 06/06/2023   MCHC 31.5 06/06/2023   RDW 13.5 06/06/2023   LYMPHSABS 2.0 06/06/2023   MONOABS 0.6 06/06/2023   EOSABS 0.3 06/06/2023  BASOSABS 0.0 06/06/2023     Last metabolic panel Lab Results  Component Value Date   NA 151 (H) 06/06/2023   K 3.7 06/06/2023   CL 125 (H) 06/06/2023   CO2 20 (L) 06/06/2023   BUN 41 (H) 06/06/2023   CREATININE 1.15 (H) 06/06/2023   GLUCOSE 132 (H) 06/06/2023   GFRNONAA 50 (L) 06/06/2023   GFRAA 52 (L) 02/21/2019   CALCIUM 8.2 (L) 06/06/2023   PHOS 2.9 11/11/2017   PROT 6.1 (L) 06/05/2023   ALBUMIN 2.7 (L) 06/05/2023   BILITOT 0.5 06/05/2023   ALKPHOS 56 06/05/2023   AST 61 (H) 06/05/2023   ALT 48 (H) 06/05/2023   ANIONGAP 6 06/06/2023    CBG (last 3)  Recent Labs    06/06/23 0353 06/06/23 0731 06/06/23 1113  GLUCAP 125* 152* 173*      Coagulation Profile: No results for input(s): "INR", "PROTIME" in the last 168 hours.   Radiology Studies: CT HEAD WO CONTRAST ( )  Result Date: 06/04/2023 CLINICAL DATA:  Mental status change of unknown cause EXAM: CT HEAD WITHOUT CONTRAST TECHNIQUE: Contiguous axial images were obtained from the base of the skull through the vertex without intravenous contrast. RADIATION DOSE REDUCTION: This exam was performed according to the departmental dose-optimization program which includes automated exposure control, adjustment of the mA and/or kV according to patient size and/or use of iterative reconstruction technique. COMPARISON:  02/21/2019 FINDINGS: Brain: Age related volume loss. Chronic small-vessel ischemic changes affect the pons. No focal cerebellar insult. Generalized atrophy of the cerebral hemispheres. Chronic small-vessel ischemic changes throughout the white matter. Old left basal ganglia/radiating white matter tract infarction. Old subcortical infarction at the right parietal vertex. No sign of acute infarction, mass lesion, hemorrhage, hydrocephalus or extra-axial  collection. Vascular: There is atherosclerotic calcification of the major vessels at the base of the brain. Skull: Negative Sinuses/Orbits: Clear/normal Other: None IMPRESSION: No acute finding by CT. Atrophy and chronic small-vessel ischemic changes as outlined above. Old left basal ganglia/radiating white matter tract infarction. Old subcortical infarction at the right parietal vertex. Electronically Signed   By: Paulina Fusi M.D.   On: 06/04/2023 18:17       Kathlen Mody M.D. [facility] 06/06/2023, 1:54 PM  Available via [employer] secure chat 7am-7pm After 7 pm, please refer to night coverage provider listed on amion.

## 2023-06-06 NOTE — Plan of Care (Signed)
  Problem: SLP Dysphagia Goals Goal: Patient will utilize recommended strategies Description: Patient will utilize recommended strategies during swallow to increase swallowing safety with Flowsheets (Taken 06/06/2023 1607) Patient will utilize recommended strategies during swallow to increase swallowing safety with: mod assist  Thank you!  Ila Mcgill, MS, CCC-SLP [phone]

## 2023-06-06 NOTE — Evaluation (Signed)
Clinical/Bedside Swallow Evaluation Patient Details  Name: Dawn Ho MRN: [id number] Date of Birth: 06-28-1951  Today's Date: 06/06/2023 Time: SLP Start Time (ACUTE ONLY): 1435 SLP Stop Time (ACUTE ONLY): 1500 SLP Time Calculation (min) (ACUTE ONLY): 25 min  Past Medical History:  Past Medical History:  Diagnosis Date   Cataract, left eye    CVA (cerebral infarction)    Depression with anxiety    Diabetes mellitus    Dyslipidemia    Fibromyalgia    Hypertension    Retinopathy    Stroke (HCC)    L. hemiparesis   TIA (transient ischemic attack)    Past Surgical History:  Past Surgical History:  Procedure Laterality Date   ABDOMINAL HYSTERECTOMY     age 47, "removed one ovary- excessive bleeding"   CESAREAN SECTION     CHOLECYSTECTOMY     LOOP RECORDER IMPLANT     Dr. Johney Frame placed 10/15/2014   ORIF HIP FRACTURE Right 09/10/2020   Procedure: im nail;  Surgeon: Eldred Manges, MD;  Location: [facility] ORS;  Service: Orthopedics;  Laterality: Right;   TEE WITHOUT CARDIOVERSION N/A 10/15/2014   Procedure: TRANSESOPHAGEAL ECHOCARDIOGRAM (TEE);  Surgeon: Pricilla Riffle, MD;  Location: [facility] ENDOSCOPY;  Service: Cardiovascular;  Laterality: N/A;   HPI:     Dawn Ho is a 72 y.o. female with PMH of dementia, CVA with left hemiparesis, ESBL infection, IDDM-2, HTN, HLD, anxiety, depression and fibromyalgia brought to ED by EMS due to "abnormal labs" and altered mental status.   As per the patient's family Normally, she is awake, alert and oriented to self and person but not place and time. They recently started assistance with feeding.      BSE requested d/t changes to pt's cognitive status and report of difficulty w/ eating and drinking.    Assessment / Plan / Recommendation  Clinical Impression  Pt seen upright in bed for BSE; pt was pleasant, but needed mod-max verbal cues to stay alert; pt able to answer some yes/no and open-ended questions given mod verbal cues; pt presents w/ missing  teeth; pt administered ice chip trials, thin liquids via cup & straw, puree, and mechanical soft trials; pt demonstrated adequate tolerance of ice chip trials; pt required min cues to initiate swallow. Pt completed thin liquid trials via cup and straw;  pt demonstrated better management of thin liquids via cup sip opposed to intake w/ straw; immediate cough noted on approx. half of thin liquid trials via straw; of note, pt observed to demonstrate oral holding of thin liquids and required max verbal cues to initiate swallow on approx. half trials; suspect immediate cough is d/t lethargic state and level of alertness; pt completed puree and mechanical soft trial w/ adequate tolerance; pt demonstrated decreased mastication time, oral holding of bolus, oral residue, and required max cues to initiate swallow for puree and mechanical soft trials; of note, liquid wash helpful in clearing oral cavity; pt not noted to fatigue w/ any intake; however, pt was lethargic throughout evaluation; recommend dysphagia 2 diet w/ thin liquids at this time w/ full supervision; ST consulted w/ nursing about diet recommendations and general swallowing precautions; recommend only feeding when awake and alert to decrease chances of possible aspiration; nursing receptive to information; ST will continue to follow POC. SLP Visit Diagnosis: Dysphagia, oropharyngeal phase (R13.12)    Aspiration Risk  Mild aspiration risk    Diet Recommendation Dysphagia 2 (Fine chop);Thin liquid    Liquid Administration via: Cup Medication  Administration: Crushed with puree Supervision: Full supervision/cueing for compensatory strategies Compensations: Small sips/bites;Minimize environmental distractions;Lingual sweep for clearance of pocketing    Other  Recommendations Oral Care Recommendations: Oral care before and after PO    Recommendations for follow up therapy are one component of a multi-disciplinary discharge planning process, led by the  attending physician.  Recommendations may be updated based on patient status, additional functional criteria and insurance authorization.  Follow up Recommendations Acute inpatient rehab (3hours/day)      Assistance Recommended at Discharge    Functional Status Assessment Patient has had a recent decline in their functional status and/or demonstrates limited ability to make significant improvements in function in a reasonable and predictable amount of time  Frequency and Duration min 2x/week  1 week       Prognosis Prognosis for improved oropharyngeal function: Fair Barriers to Reach Goals: Cognitive deficits      Swallow Study   General Date of Onset: 06/04/23 Type of Study: Bedside Swallow Evaluation Respiratory Status: Room air History of Recent Intubation: No Behavior/Cognition: Lethargic/Drowsy;Requires cueing;Doesn't follow directions Oral Cavity Assessment: Dry Oral Care Completed by SLP: No Oral Cavity - Dentition: Missing dentition;Poor condition Vision: Impaired for self-feeding Self-Feeding Abilities: Needs assist Patient Positioning: Upright in bed;Postural control interferes with function Baseline Vocal Quality: Low vocal intensity Volitional Cough: Weak Volitional Swallow: Able to elicit    Oral/Motor/Sensory Function Overall Oral Motor/Sensory Function: Generalized oral weakness   Ice Chips Ice chips: Within functional limits Presentation: Spoon   Thin Liquid Thin Liquid: Impaired Presentation: Cup;Straw Oral Phase Impairments: Reduced lingual movement/coordination;Poor awareness of bolus Oral Phase Functional Implications: Oral holding Pharyngeal  Phase Impairments: Cough - Immediate    Nectar Thick     Honey Thick     Puree Puree: Impaired Presentation: Spoon Oral Phase Impairments: Reduced lingual movement/coordination;Poor awareness of bolus;Impaired mastication Oral Phase Functional Implications: Oral residue;Oral holding;Prolonged oral transit    Solid     Solid: Impaired (Mech Soft) Presentation:  (Fed by ST) Oral Phase Impairments: Reduced lingual movement/coordination;Poor awareness of bolus;Impaired mastication Oral Phase Functional Implications: Oral holding;Impaired mastication;Oral residue Pharyngeal Phase Impairments: Suspected delayed Swallow     Thank you!  Ila Mcgill, MS, CCC-SLP [phone] Ila Mcgill 06/06/2023,3:53 PM

## 2023-06-07 DIAGNOSIS — G9341 Metabolic encephalopathy: Secondary | ICD-10-CM | POA: Diagnosis not present

## 2023-06-07 DIAGNOSIS — I639 Cerebral infarction, unspecified: Secondary | ICD-10-CM | POA: Diagnosis not present

## 2023-06-07 DIAGNOSIS — E876 Hypokalemia: Secondary | ICD-10-CM | POA: Diagnosis not present

## 2023-06-07 DIAGNOSIS — N179 Acute kidney failure, unspecified: Secondary | ICD-10-CM | POA: Diagnosis not present

## 2023-06-07 LAB — CBC WITH DIFFERENTIAL/PLATELET
Abs Immature Granulocytes: 0.04 10*3/uL (ref 0.00–0.07)
Basophils Absolute: 0 10*3/uL (ref 0.0–0.1)
Basophils Relative: 0 %
Eosinophils Absolute: 0.7 10*3/uL — ABNORMAL HIGH (ref 0.0–0.5)
Eosinophils Relative: 7 %
HCT: 33.9 % — ABNORMAL LOW (ref 36.0–46.0)
Hemoglobin: 10.4 g/dL — ABNORMAL LOW (ref 12.0–15.0)
Immature Granulocytes: 0 %
Lymphocytes Relative: 33 %
Lymphs Abs: 3.4 10*3/uL (ref 0.7–4.0)
MCH: 29.2 pg (ref 26.0–34.0)
MCHC: 30.7 g/dL (ref 30.0–36.0)
MCV: 95.2 fL (ref 80.0–100.0)
Monocytes Absolute: 0.7 10*3/uL (ref 0.1–1.0)
Monocytes Relative: 7 %
Neutro Abs: 5.3 10*3/uL (ref 1.7–7.7)
Neutrophils Relative %: 53 %
Platelets: 148 10*3/uL — ABNORMAL LOW (ref 150–400)
RBC: 3.56 MIL/uL — ABNORMAL LOW (ref 3.87–5.11)
RDW: 13.2 % (ref 11.5–15.5)
WBC: 10 10*3/uL (ref 4.0–10.5)
nRBC: 0 % (ref 0.0–0.2)

## 2023-06-07 LAB — BASIC METABOLIC PANEL
Anion gap: 8 (ref 5–15)
BUN: 31 mg/dL — ABNORMAL HIGH (ref 8–23)
CO2: 21 mmol/L — ABNORMAL LOW (ref 22–32)
Calcium: 8.3 mg/dL — ABNORMAL LOW (ref 8.9–10.3)
Chloride: 119 mmol/L — ABNORMAL HIGH (ref 98–111)
Creatinine, Ser: 1.08 mg/dL — ABNORMAL HIGH (ref 0.44–1.00)
GFR, Estimated: 54 mL/min — ABNORMAL LOW (ref >60)
Glucose, Bld: 207 mg/dL — ABNORMAL HIGH (ref 70–99)
Potassium: 3.3 mmol/L — ABNORMAL LOW (ref 3.5–5.1)
Sodium: 148 mmol/L — ABNORMAL HIGH (ref 135–145)

## 2023-06-07 LAB — URINE CULTURE: Culture: 40000 — AB

## 2023-06-07 LAB — GLUCOSE, CAPILLARY
Glucose-Capillary: 207 mg/dL — ABNORMAL HIGH (ref 70–99)
Glucose-Capillary: 177 mg/dL — ABNORMAL HIGH (ref 70–99)
Glucose-Capillary: 181 mg/dL — ABNORMAL HIGH (ref 70–99)
Glucose-Capillary: 178 mg/dL — ABNORMAL HIGH (ref 70–99)
Glucose-Capillary: 94 mg/dL (ref 70–99)

## 2023-06-07 LAB — MRSA NEXT GEN BY PCR, NASAL: MRSA by PCR Next Gen: NOT DETECTED

## 2023-06-07 MED ORDER — POTASSIUM CHLORIDE CRYS ER 20 MEQ PO TBCR
40.0000 meq | EXTENDED_RELEASE_TABLET | Freq: Once | ORAL | Status: AC
  Administered 2023-06-07: 40 meq via ORAL
  Filled 2023-06-07: qty 2

## 2023-06-07 MED ORDER — SODIUM CHLORIDE 0.9% FLUSH
3.0000 mL | Freq: Two times a day (BID) | INTRAVENOUS | Status: DC
  Administered 2023-06-07 – 2023-06-10 (×6): 3 mL via INTRAVENOUS

## 2023-06-07 MED ORDER — LINEZOLID 600 MG PO TABS: 600.0000 mg | ORAL_TABLET | Freq: Two times a day (BID) | ORAL | 0 refills | Status: AC

## 2023-06-07 MED ORDER — NYSTATIN 100000 UNIT/GM EX POWD: Freq: Two times a day (BID) | CUTANEOUS | 0 refills | Status: DC

## 2023-06-07 MED ORDER — FUROSEMIDE 20 MG PO TABS
20.0000 mg | ORAL_TABLET | Freq: Every day | ORAL | Status: DC
  Administered 2023-06-07: 20 mg via ORAL
  Filled 2023-06-07: qty 1

## 2023-06-07 MED ORDER — LINEZOLID 600 MG PO TABS
600.0000 mg | ORAL_TABLET | Freq: Two times a day (BID) | ORAL | Status: DC
  Administered 2023-06-07 – 2023-06-09 (×6): 600 mg via ORAL
  Filled 2023-06-07 (×11): qty 1

## 2023-06-07 MED ORDER — INSULIN GLARGINE-YFGN 100 UNIT/ML ~~LOC~~ SOLN
10.0000 [IU] | Freq: Every day | SUBCUTANEOUS | Status: DC
  Administered 2023-06-07 – 2023-06-09 (×3): 10 [IU] via SUBCUTANEOUS
  Filled 2023-06-07 (×4): qty 0.1

## 2023-06-07 NOTE — Plan of Care (Signed)
[facility] Tonsillectomy/Adenoidectomy, Postoperative PEDS care plan entered in error.

## 2023-06-07 NOTE — TOC Progression Note (Signed)
Transition of Care [facility]) - Progression Note    Patient Details  Name: Dawn Ho MRN: [id number] Date of Birth: 1950/12/20  Transition of [facility]) CM/SW Contact  Elliot Gault, LCSW Phone Number: 06/07/2023, 10:01 AM  Clinical Narrative:     TOC following. MD anticipating dc tomorrow. Pharmacist states pt will be on Zyvox for seven days.  Updated [facility] at [facility] who states pt can return tomorrow. She asks that MD escribe the Zyvox to their pharmacy [facility]) today as they don't normally have this med in stock and they need a day to get it delivered. Updated MD.  TOC will follow up in AM to assist with dc plan.       Expected Discharge Plan and Services                                               Social Determinants of Health (SDOH) Interventions SDOH Screenings   Food Insecurity: Patient Unable To Answer (06/04/2023)  Housing: Patient Unable To Answer (06/04/2023)  Transportation Needs: Patient Unable To Answer (06/04/2023)  Utilities: Patient Unable To Answer (06/04/2023)  Tobacco Use: Low Risk  (06/04/2023)    Readmission Risk Interventions     No data to display

## 2023-06-07 NOTE — Care Management Important Message (Addendum)
Important Message  Patient Details  Name: Dawn Ho MRN: [id number] Date of Birth: 08-28-1950   Important Message Given:  Other (see comment) (unable to speak with patient by phone, left vm at [phone] for son to return call)     Corey Harold 06/07/2023, 11:38 AM

## 2023-06-07 NOTE — Progress Notes (Signed)
SLP Cancellation Note  Patient Details Name: Dawn Ho MRN: [id number] DOB: 1951/01/25   Cancelled treatment:       Reason Eval/Treat Not Completed: Fatigue/lethargy limiting ability to participate. Pt has reportedly been intermittently alert today and is not currently appropriate for PO trials. RN reports some difficulty with D2 meats at breakfast this am. Recommend downgraded Pt's diet to D1/puree. ST will continue to follow acutely, thank you  Nayana Lenig H. Romie Levee, CCC-SLP Speech Language Pathologist    [name] 06/07/2023, 4:13 PM

## 2023-06-07 NOTE — Inpatient Diabetes Management (Signed)
Inpatient Diabetes Program Recommendations  AACE/ADA: New Consensus Statement on Inpatient Glycemic Control (2015)  Target Ranges:  Prepandial:   less than 140 mg/dL      Peak postprandial:   less than 180 mg/dL (1-2 hours)      Critically ill patients:  140 - 180 mg/dL   Lab Results  Component Value Date   GLUCAP 177 (H) 06/07/2023   HGBA1C 8.3 (H) 06/04/2023    Review of Glycemic Control  Latest Reference Range & Units 06/06/23 20:05 06/06/23 23:57 06/07/23 03:25 06/07/23 07:46  Glucose-Capillary 70 - 99 mg/dL 657 (H) 846 (H) 962 (H) 177 (H)  (H): Data is abnormally high Diabetes history: Type 2 DM Outpatient Diabetes medications: Lantus 15 units at bedtime, Novolog 0-9 units TID Current orders for Inpatient glycemic control: Novolog 0-9 units Q4H  Inpatient Diabetes Program Recommendations:    Consider adding Semglee 10 units at bedtime.   Thanks, Lujean Rave, MSN, RNC-OB Diabetes Coordinator [phone] (8a-5p)

## 2023-06-07 NOTE — Progress Notes (Signed)
[facility]                                                                               Camellia Lehmkuhl, is a 72 y.o. female, DOB - [birth year], ZOX:[id number] Admit date - 06/04/2023    Outpatient Primary MD for the patient is Eden Emms, Hali Marry, MD  LOS - 3  days    Brief summary    Dawn Ho is a 72 y.o. female with PMH of dementia, CVA with left hemiparesis, ESBL infection, IDDM-2, HTN, HLD, anxiety, depression and fibromyalgia brought to ED by EMS due to "abnormal labs" and altered mental status.  As per the patient's family Normally, she is awake, alert and oriented to self and person but not place and time. They recently started assistance with feeding.  Patient's son received a call from nursing home stating that they are placing a PICC line for hydration and IV antibiotics . She was febrile ,  and  UA with large LE, large Hgb, few bacteria, negative nitrite and > 50 RBC.     Assessment & Plan    Assessment and Plan:  SIRS probably secondary to urinary tract infection Patient febrile on admission blood pressures were soft. Had leukocytosis of 18,000 and In AKI. With a history of ESBL infection She was started on IV meropenem, transition to oral zyvox to complete the course. Urine cultures growing enterococcus .  Lactic acid normal. Leukocytosis resolved.     Acute metabolic encephalopathy in the setting  of advanced dementia, CVA with left hemiparesis. Secondary to hypernatremia and aki Patient is more alert today, and oriented to person.  CT head does not show any any acute changes or stroke. It shows chronic ischemic changes.  Old left basal ganglia/radiating white matter tract infarction. Old subcortical infarction at the right parietal vertex. TSH within normal limits.  Vitamin B12 levels are elevated at 1048 ABG shows pH of 7.38. Hypernatremia improved, renal parameters are almost back to baseline.     Hypernatremia, AKI with non-anion gap  metabolic acidosis; Sodium is down to 148. Encourage free water intake.  Creatinine on admission of 1.68-1.26 to 1.15 to 1.08.    Hypokalemia Replaced, recheck in am.   History of CVA with left hemiparesis patient on Plavix at home continue the same  Leukocytosis Resolved.  Procalcitonin is less than 0.10   Insulin-dependent diabetes mellitus, with hyperglycemia Hemoglobin A1c at 8.3 Continue with sliding scale insulin. Gabapentin for diabetic neuropathy.   CBG (last 3)  Recent Labs    06/07/23 0325 06/07/23 0746 06/07/23 1126  GLUCAP 207* 177* 181*   Started the patient on semglee 10 units daily.     Hypertension Blood pressure parameters  are well controlled.    History of anxiety and depression Continue to monitor   Hypothyroidism Continue with Synthroid 50 m CBG daily   Therapy evaluations have been ordered Palliative care will be consulted, for goals of care if fails to improve.   Nutrition:  diet changed tto dysphagia 2 diet.  SLP evaluation ordered. Pt to be on dysphagia 2 diet.   Mild anemia of chronic disease:  Hemoglobin around 10.  Estimated body mass index is 31.65 kg/m as calculated from the following:   Height as of this encounter: 5' (1.524 m).   Weight as of this encounter: 73.5 kg.  Code Status: full code DVT Prophylaxis:  heparin injection 5,000 Units Start: 06/04/23 2200   Level of Care: Level of care: Telemetry Family Communication: none at bedside.   Disposition Plan:     Remains inpatient appropriate:  Hypernatremia.   Procedures:  None.   Consultants:   SLP and therapy evaluations.   Antimicrobials:   Anti-infectives (From admission, onward)    Start     Dose/Rate Route Frequency Ordered Stop   06/07/23 1015  linezolid (ZYVOX) tablet 600 mg        600 mg Oral Every 12 hours 06/07/23 0923     06/07/23 0000  linezolid (ZYVOX) 600 MG tablet        600 mg Oral Every 12 hours 06/07/23 1013 06/13/23 2359    06/04/23 1630  meropenem (MERREM) 1 g in sodium chloride 0.9 % 100 mL IVPB  Status:  Discontinued        1 g 200 mL/hr over 30 Minutes Intravenous Every 12 hours 06/04/23 1603 06/07/23 0923   06/04/23 1430  cefTRIAXone (ROCEPHIN) 2 g in sodium chloride 0.9 % 100 mL IVPB  Status:  Discontinued        2 g 200 mL/hr over 30 Minutes Intravenous Every 24 hours 06/04/23 1425 06/04/23 1538        Medications  Scheduled Meds:  Chlorhexidine Gluconate Cloth  6 each Topical Daily   clopidogrel  75 mg Oral Daily   escitalopram  20 mg Oral Daily   furosemide  20 mg Oral Daily   gabapentin  300 mg Oral QHS   gabapentin  400 mg Oral Daily   heparin  5,000 Units Subcutaneous Q8H   insulin aspart  0-9 Units Subcutaneous Q4H   insulin glargine-yfgn  10 Units Subcutaneous QHS   levothyroxine  50 mcg Oral Daily   linezolid  600 mg Oral Q12H   metoprolol tartrate  25 mg Oral Q supper   metoprolol tartrate  50 mg Oral Q breakfast   nystatin   Topical BID   potassium chloride  40 mEq Oral Once   sodium chloride flush  3 mL Intravenous Q12H   Continuous Infusions:   PRN Meds:.LORazepam, ondansetron **OR** ondansetron (ZOFRAN) IV    Subjective:   Candyce Alcantara was seen and examined today. Slowly improving. No new events overnight. Alert and responding to verbal cues. Saying few words.   Objective:   Vitals:   06/06/23 0352 06/06/23 1440 06/06/23 2057 06/07/23 0325  BP: 127/71 138/76 128/62 (!) 120/52  Pulse: 95 74 78 71  Resp: 16 20 20 18   Temp: 98.9 F (37.2 C) 98.5 F (36.9 C) 97.9 F (36.6 C) 98.9 F (37.2 C)  TempSrc:  Axillary    SpO2: 98% 92% 96% 97%  Weight:      Height:        Intake/Output Summary (Last 24 hours) at 06/07/2023 1129 Last data filed at 06/07/2023 4098 Gross per 24 hour  Intake 1711.45 ml  Output 1825 ml  Net -113.55 ml   Filed Weights   06/04/23 1247  Weight: 73.5 kg     Exam General exam: Appears calm and comfortable  Respiratory system: Clear  to auscultation. Respiratory effort normal. Cardiovascular system: S1 & S2 heard, RRR. No JVD,  Gastrointestinal system: Abdomen is nondistended, soft and nontender.  Central nervous system: Alert and oriented to person.  Extremities: no pedal edema.  Skin: No rashes,  Psychiatry: mood is appropriate.     Data Reviewed:  I have personally reviewed following labs and imaging studies   CBC Lab Results  Component Value Date   WBC 10.0 06/07/2023   RBC 3.56 (L) 06/07/2023   HGB 10.4 (L) 06/07/2023   HCT 33.9 (L) 06/07/2023   MCV 95.2 06/07/2023   MCH 29.2 06/07/2023   PLT 148 (L) 06/07/2023   MCHC 30.7 06/07/2023   RDW 13.2 06/07/2023   LYMPHSABS 3.4 06/07/2023   MONOABS 0.7 06/07/2023   EOSABS 0.7 (H) 06/07/2023   BASOSABS 0.0 06/07/2023     Last metabolic panel Lab Results  Component Value Date   NA 148 (H) 06/07/2023   K 3.3 (L) 06/07/2023   CL 119 (H) 06/07/2023   CO2 21 (L) 06/07/2023   BUN 31 (H) 06/07/2023   CREATININE 1.08 (H) 06/07/2023   GLUCOSE 207 (H) 06/07/2023   GFRNONAA 54 (L) 06/07/2023   GFRAA 52 (L) 02/21/2019   CALCIUM 8.3 (L) 06/07/2023   PHOS 2.9 11/11/2017   PROT 6.1 (L) 06/05/2023   ALBUMIN 2.7 (L) 06/05/2023   BILITOT 0.5 06/05/2023   ALKPHOS 56 06/05/2023   AST 61 (H) 06/05/2023   ALT 48 (H) 06/05/2023   ANIONGAP 8 06/07/2023    CBG (last 3)  Recent Labs    06/07/23 0325 06/07/23 0746 06/07/23 1126  GLUCAP 207* 177* 181*      Coagulation Profile: No results for input(s): "INR", "PROTIME" in the last 168 hours.   Radiology Studies: No results found.     Kathlen Mody M.D. [facility] 06/07/2023, 11:29 AM  Available via Epic secure chat 7am-7pm After 7 pm, please refer to night coverage provider listed on amion.

## 2023-06-08 DIAGNOSIS — N179 Acute kidney failure, unspecified: Secondary | ICD-10-CM | POA: Diagnosis not present

## 2023-06-08 DIAGNOSIS — G9341 Metabolic encephalopathy: Secondary | ICD-10-CM | POA: Diagnosis not present

## 2023-06-08 DIAGNOSIS — E1165 Type 2 diabetes mellitus with hyperglycemia: Secondary | ICD-10-CM | POA: Diagnosis not present

## 2023-06-08 DIAGNOSIS — E876 Hypokalemia: Secondary | ICD-10-CM | POA: Diagnosis not present

## 2023-06-08 LAB — BASIC METABOLIC PANEL
Anion gap: 8 (ref 5–15)
BUN: 27 mg/dL — ABNORMAL HIGH (ref 8–23)
CO2: 23 mmol/L (ref 22–32)
Calcium: 8.8 mg/dL — ABNORMAL LOW (ref 8.9–10.3)
Chloride: 118 mmol/L — ABNORMAL HIGH (ref 98–111)
Creatinine, Ser: 1.13 mg/dL — ABNORMAL HIGH (ref 0.44–1.00)
GFR, Estimated: 51 mL/min — ABNORMAL LOW (ref >60)
Glucose, Bld: 172 mg/dL — ABNORMAL HIGH (ref 70–99)
Potassium: 4.1 mmol/L (ref 3.5–5.1)
Sodium: 149 mmol/L — ABNORMAL HIGH (ref 135–145)

## 2023-06-08 LAB — GLUCOSE, CAPILLARY
Glucose-Capillary: 141 mg/dL — ABNORMAL HIGH (ref 70–99)
Glucose-Capillary: 165 mg/dL — ABNORMAL HIGH (ref 70–99)
Glucose-Capillary: 207 mg/dL — ABNORMAL HIGH (ref 70–99)
Glucose-Capillary: 207 mg/dL — ABNORMAL HIGH (ref 70–99)
Glucose-Capillary: 238 mg/dL — ABNORMAL HIGH (ref 70–99)
Glucose-Capillary: 291 mg/dL — ABNORMAL HIGH (ref 70–99)

## 2023-06-08 LAB — FOLATE: Folate: 19.8 ng/mL (ref >5.9)

## 2023-06-08 MED ORDER — INSULIN ASPART 100 UNIT/ML IJ SOLN
0.0000 [IU] | Freq: Three times a day (TID) | INTRAMUSCULAR | Status: DC
  Administered 2023-06-08: 3 [IU] via SUBCUTANEOUS
  Administered 2023-06-09: 5 [IU] via SUBCUTANEOUS
  Administered 2023-06-09: 2 [IU] via SUBCUTANEOUS
  Administered 2023-06-09: 1 [IU] via SUBCUTANEOUS

## 2023-06-08 MED ORDER — DEXTROSE 5 % IV SOLN: INTRAVENOUS | Status: AC

## 2023-06-08 MED ORDER — METOPROLOL TARTRATE 25 MG PO TABS
12.5000 mg | ORAL_TABLET | Freq: Two times a day (BID) | ORAL | Status: DC
  Administered 2023-06-08 – 2023-06-10 (×3): 12.5 mg via ORAL
  Filled 2023-06-08 (×4): qty 1

## 2023-06-08 MED ORDER — DEXTROSE 5 % IV SOLN
INTRAVENOUS | Status: DC
Start: 2023-06-08 — End: 2023-06-08

## 2023-06-08 MED ORDER — INSULIN ASPART 100 UNIT/ML IJ SOLN
0.0000 [IU] | Freq: Every day | INTRAMUSCULAR | Status: DC
  Administered 2023-06-08: 4 [IU] via SUBCUTANEOUS
  Administered 2023-06-09: 2 [IU] via SUBCUTANEOUS

## 2023-06-08 NOTE — Progress Notes (Signed)
20          PROGRESS NOTE  Dawn Ho IHK:[id number] DOB: [birth year] DOA: 06/04/2023 PCP: Bernerd Limbo, MD  Brief History:  72 year old female with history of dementia, CVA with left hemiparesis, hypertension, hyperlipidemia, diabetes mellitus type 2, anxiety/depression/fibromyalgia, bilateral staghorn calculi, and right femoral fracture status post IM nail 08/2020 presents from [facility] secondary to abnormal labs, altered mental status, and decreased oral intake.  The patient was noted to have elevated serum creatinine and hyponatremia.  Apparently, the patient is nursing facility had placed a PICC line for IV antibiotics and IV fluids prior to admission. At baseline, the patient is usually alert oriented x 2, usually oriented to self and place.  In the ED, the patient was afebrile and hemodynamically stable.  WBC 18.2, hemoglobin 13.6, platelets 253.  Sodium 156, serum creatinine 1.61.  CT brain was negative.  Chest x-ray was negative for any acute findings.  UA >50 WBC.   the patient was started on IV fluids and IV meropenem initially.   Assessment/Plan: Sepsis -Present on admission -Presented with leukocytosis, soft blood pressures, and fever up to 100.4 F. -Secondary to UTI -UA > 50 WBC -06/04/2023 blood cultures negative -Lactic acid 1.9>> 1.6 -Chest x-ray--no infiltrates  UTI -Urine culture showed Enterococcus faecalis -Continue linezolid -Holding Lexapro while taking linezolid  Acute kidney injury -Baseline creatinine 1.0-1.2 -Secondary to volume depletion -Patient presented with serum creatinine 1.61  Acute metabolic encephalopathy -Multifactorial including sepsis/UTI, acute kidney injury, hypernatremia -Ammonia 26 -B12 1048 -TSH 1.769 -06/04/2023 CT brain negative -06/04/2023 VBG 7.3 0/41/51/23  Hypernatremia -Presented with sodium 156 -Restart hypotonic fluid -Hold furosemide  Uncontrolled diabetes mellitus type 2 with  hyperglycemia -06/04/2023 hemoglobin A1c 8.3 -Continue Semglee 10 units -Continue NovoLog sliding scale  Essential hypertension -Reduce dose of metoprolol secondary to soft blood pressure  Metabolic acidosis -Presented with bicarb 20 -Improved  Hypokalemia -repleted   Family Communication:   no Family at bedside  Consultants:  none  Code Status:  FULL   DVT Prophylaxis:  Gibson Heparin / Payne Lovenox   Procedures: As Listed in Progress Note Above  Antibiotics: Merrem 12/15>>12/18 Zyvox 12/18>>    Subjective: Pt is awake and follows one step commands.  Denies cp, sob, abd pain, headache  Objective: Vitals:   06/07/23 0325 06/07/23 1417 06/07/23 2024 06/08/23 0421  BP: (!) 120/52 (!) 119/54 (!) 116/56 110/70  Pulse: 71 67 62 72  Resp: 18 19 20 18   Temp: 98.9 F (37.2 C) 97.7 F (36.5 C) 98.7 F (37.1 C) 98.5 F (36.9 C)  TempSrc:  Axillary  Oral  SpO2: 97% 97% 95% 96%  Weight:      Height:        Intake/Output Summary (Last 24 hours) at 06/08/2023 1011 Last data filed at 06/08/2023 0900 Gross per 24 hour  Intake 180 ml  Output 800 ml  Net -620 ml   Weight change:  Exam:  General:  Pt is alert, follows commands appropriately, not in acute distress HEENT: No icterus, No thrush, No neck mass, [place]/AT Cardiovascular: RRR, S1/S2, no rubs, no gallops Respiratory: CTA bilaterally, no wheezing, no crackles, no rhonchi Abdomen: Soft/+BS, non tender, non distended, no guarding Extremities: No edema, No lymphangitis, No petechiae, No rashes, no synovitis   Data Reviewed: I have personally reviewed following labs and imaging studies Basic Metabolic Panel: Recent Labs  Lab 06/04/23 1630 06/04/23 2216 06/05/23 1645 06/05/23 2036 06/06/23 0217 06/07/23 0410 06/08/23 0434  NA 156*   < >  151* 151* 151* 148* 149*  K 3.4*   < > 3.4* 3.6 3.7 3.3* 4.1  CL 126*   < > 123* 124* 125* 119* 118*  CO2 22   < > 20* 20* 20* 21* 23  GLUCOSE 262*   < > 104* 115* 132*  207* 172*  BUN 61*   < > 45* 41* 41* 31* 27*  CREATININE 1.61*   < > 1.10* 1.05* 1.15* 1.08* 1.13*  CALCIUM 9.3   < > 8.3* 8.3* 8.2* 8.3* 8.8*  MG 2.6*  --   --   --   --   --   --    < > = values in this interval not displayed.   Liver Function Tests: Recent Labs  Lab 06/04/23 1250 06/05/23 0409  AST 35 61*  ALT 27 48*  ALKPHOS 66 56  BILITOT 0.5 0.5  PROT 7.2 6.1*  ALBUMIN 3.0* 2.7*   No results for input(s): "LIPASE", "AMYLASE" in the last 168 hours. Recent Labs  Lab 06/04/23 1525  AMMONIA 26   Coagulation Profile: No results for input(s): "INR", "PROTIME" in the last 168 hours. CBC: Recent Labs  Lab 06/04/23 1250 06/05/23 0409 06/06/23 0958 06/07/23 0410  WBC 18.2* 18.1* 10.3 10.0  NEUTROABS 14.6*  --  7.3 5.3  HGB 13.6 12.1 11.6* 10.4*  HCT 43.0 41.7 36.8 33.9*  MCV 96.0 100.5* 96.6 95.2  PLT 253 189 159 148*   Cardiac Enzymes: No results for input(s): "CKTOTAL", "CKMB", "CKMBINDEX", "TROPONINI" in the last 168 hours. BNP: Invalid input(s): "POCBNP" CBG: Recent Labs  Lab 06/07/23 1613 06/07/23 2012 06/08/23 0019 06/08/23 0424 06/08/23 0725  GLUCAP 178* 94 207* 165* 141*   HbA1C: No results for input(s): "HGBA1C" in the last 72 hours. Urine analysis:    Component Value Date/Time   COLORURINE YELLOW 06/04/2023 1321   APPEARANCEUR CLOUDY (A) 06/04/2023 1321   APPEARANCEUR Clear 04/18/2022 1133   LABSPEC 1.012 06/04/2023 1321   PHURINE 7.0 06/04/2023 1321   GLUCOSEU >=500 (A) 06/04/2023 1321   HGBUR LARGE (A) 06/04/2023 1321   BILIRUBINUR NEGATIVE 06/04/2023 1321   BILIRUBINUR Negative 04/18/2022 1133   KETONESUR NEGATIVE 06/04/2023 1321   PROTEINUR 100 (A) 06/04/2023 1321   UROBILINOGEN 0.2 11/17/2014 1100   NITRITE NEGATIVE 06/04/2023 1321   LEUKOCYTESUR LARGE (A) 06/04/2023 1321   Sepsis Labs: @LABRCNTIP (procalcitonin:4,lacticidven:4) ) Recent Results (from the past 240 hour(s))  Urine Culture     Status: Abnormal   Collection Time:  06/04/23  1:21 PM   Specimen: Urine, Random  Result Value Ref Range Status   Specimen Description   Final    URINE, RANDOM Performed at [facility], [address], [place], [place] [address]    Special Requests   Final    NONE Performed at [facility], [address], [place], [place] [address]    Culture 40,000 COLONIES/mL ENTEROCOCCUS FAECALIS (A)  Final   Report Status 06/07/2023 FINAL  Final   Organism ID, Bacteria ENTEROCOCCUS FAECALIS (A)  Final      Susceptibility   Enterococcus faecalis - MIC*    AMPICILLIN <=2 SENSITIVE Sensitive     NITROFURANTOIN <=16 SENSITIVE Sensitive     VANCOMYCIN 1 SENSITIVE Sensitive     * 40,000 COLONIES/mL ENTEROCOCCUS FAECALIS  Culture, blood (single)     Status: None (Preliminary result)   Collection Time: 06/04/23  2:53 PM   Specimen: BLOOD  Result Value Ref Range Status   Specimen Description BLOOD PICC LINE  Final  Special Requests   Final    BOTTLES DRAWN AEROBIC AND ANAEROBIC Blood Culture results may not be optimal due to an excessive volume of blood received in culture bottles   Culture   Final    NO GROWTH 4 DAYS Performed at [facility], [address], [place], [place] [address]    Report Status PENDING  Incomplete  Culture, blood (Routine X 2) w Reflex to ID Panel     Status: None (Preliminary result)   Collection Time: 06/04/23  3:25 PM   Specimen: BLOOD  Result Value Ref Range Status   Specimen Description BLOOD PICC LINE  Final   Special Requests   Final    BOTTLES DRAWN AEROBIC AND ANAEROBIC Blood Culture adequate volume   Culture   Final    NO GROWTH 4 DAYS Performed at [facility], [address] [place], [place] [address]    Report Status PENDING  Incomplete  MRSA Next Gen by PCR, Nasal     Status: None   Collection Time: 06/07/23  4:40 AM   Specimen: Nasal Mucosa; Nasal Swab  Result Value Ref Range Status   MRSA by PCR Next Gen NOT DETECTED NOT DETECTED Final    Comment: (NOTE) The GeneXpert MRSA Assay (FDA  approved for NASAL specimens only), is one component of a comprehensive MRSA colonization surveillance program. It is not intended to diagnose MRSA infection nor to guide or monitor treatment for MRSA infections. Test performance is not FDA approved in patients less than 65 years old. Performed at [facility], [address] [place], [place] [address]      Scheduled Meds:  Chlorhexidine Gluconate Cloth  6 each Topical Daily   clopidogrel  75 mg Oral Daily   gabapentin  300 mg Oral QHS   gabapentin  400 mg Oral Daily   heparin  5,000 Units Subcutaneous Q8H   insulin aspart  0-9 Units Subcutaneous Q4H   insulin glargine-yfgn  10 Units Subcutaneous QHS   levothyroxine  50 mcg Oral Daily   linezolid  600 mg Oral Q12H   metoprolol tartrate  12.5 mg Oral BID   nystatin   Topical BID   sodium chloride flush  3 mL Intravenous Q12H   Continuous Infusions:  Procedures/Studies: CT HEAD WO CONTRAST ( )  Result Date: 06/04/2023 CLINICAL DATA:  Mental status change of unknown cause EXAM: CT HEAD WITHOUT CONTRAST TECHNIQUE: Contiguous axial images were obtained from the base of the skull through the vertex without intravenous contrast. RADIATION DOSE REDUCTION: This exam was performed according to the departmental dose-optimization program which includes automated exposure control, adjustment of the mA and/or kV according to patient size and/or use of iterative reconstruction technique. COMPARISON:  02/21/2019 FINDINGS: Brain: Age related volume loss. Chronic small-vessel ischemic changes affect the pons. No focal cerebellar insult. Generalized atrophy of the cerebral hemispheres. Chronic small-vessel ischemic changes throughout the white matter. Old left basal ganglia/radiating white matter tract infarction. Old subcortical infarction at the right parietal vertex. No sign of acute infarction, mass lesion, hemorrhage, hydrocephalus or extra-axial collection. Vascular: There is atherosclerotic  calcification of the major vessels at the base of the brain. Skull: Negative Sinuses/Orbits: Clear/normal Other: None IMPRESSION: No acute finding by CT. Atrophy and chronic small-vessel ischemic changes as outlined above. Old left basal ganglia/radiating white matter tract infarction. Old subcortical infarction at the right parietal vertex. Electronically Signed   By: Paulina Fusi M.D.   On: 06/04/2023 18:17   DG Chest [facility] 1 View  Result  Date: 06/04/2023 CLINICAL DATA:  Weakness EXAM: PORTABLE CHEST 1 VIEW COMPARISON:  XR 09/09/20. FINDINGS: Film is rotated to the right. Underinflation. No consolidation, pneumothorax or effusion. No edema. Normal cardiopericardial silhouette. Old right-sided rib fractures. Loop recorder overlying the midthorax. Calcified aorta. Overlapping cardiac leads. IMPRESSION: Rotated radiograph. Loop recorder. No acute cardiopulmonary disease. Electronically Signed   By: Karen Kays M.D.   On: 06/04/2023 15:19    Catarina Hartshorn, DO  [facility]  If 7PM-7AM, please contact night-coverage [email or web address] Password [email or web address] 06/08/2023, 10:11 AM   LOS: 4 days

## 2023-06-08 NOTE — Hospital Course (Signed)
72 year old female with history of dementia, CVA with left hemiparesis, hypertension, hyperlipidemia, diabetes mellitus type 2, anxiety/depression/fibromyalgia, bilateral staghorn calculi, and right femoral fracture status post IM nail 08/2020 presents from [facility] secondary to abnormal labs, altered mental status, and decreased oral intake.  The patient was noted to have elevated serum creatinine and hyponatremia.  Apparently, the patient is nursing facility had placed a PICC line for IV antibiotics and IV fluids prior to admission. At baseline, the patient is usually alert oriented x 2, usually oriented to self and place.  In the ED, the patient was afebrile and hemodynamically stable.  WBC 18.2, hemoglobin 13.6, platelets 253.  Sodium 156, serum creatinine 1.61.  CT brain was negative.  Chest x-ray was negative for any acute findings.  UA >50 WBC.   the patient was started on IV fluids and IV meropenem initially.

## 2023-06-08 NOTE — Progress Notes (Signed)
Patient alert with confusion, tolerated medications crushed in applesauce. Repositioned in bed during shift. Foley catheter removed per order, patient voided once after foley removed output 100. Bladder scanned patient prior to patient voiding, bladder scan reading was 128.

## 2023-06-08 NOTE — TOC Progression Note (Signed)
Transition of Care [facility]) - Progression Note    Patient Details  Name: Dawn Ho MRN: [id number] Date of Birth: [birth year]  Transition of Care [facility]) CM/SW Contact  Elliot Gault, LCSW Phone Number: 06/08/2023, 10:15 AM  Clinical Narrative:     TOC following. Per MD, pt not stable for dc today. He is hopeful for tomorrow. Updated [facility] at [facility].  TOC will follow.       Expected Discharge Plan and Services                                               Social Determinants of Health (SDOH) Interventions SDOH Screenings   Food Insecurity: Patient Unable To Answer (06/04/2023)  Housing: Patient Unable To Answer (06/04/2023)  Transportation Needs: Patient Unable To Answer (06/04/2023)  Utilities: Patient Unable To Answer (06/04/2023)  Tobacco Use: Low Risk  (06/04/2023)    Readmission Risk Interventions     No data to display

## 2023-06-08 NOTE — Plan of Care (Signed)

## 2023-06-09 DIAGNOSIS — N3001 Acute cystitis with hematuria: Secondary | ICD-10-CM | POA: Diagnosis not present

## 2023-06-09 DIAGNOSIS — E1165 Type 2 diabetes mellitus with hyperglycemia: Secondary | ICD-10-CM | POA: Diagnosis not present

## 2023-06-09 DIAGNOSIS — G9341 Metabolic encephalopathy: Secondary | ICD-10-CM | POA: Diagnosis not present

## 2023-06-09 DIAGNOSIS — N179 Acute kidney failure, unspecified: Secondary | ICD-10-CM | POA: Diagnosis not present

## 2023-06-09 LAB — BASIC METABOLIC PANEL
Anion gap: 8 (ref 5–15)
BUN: 26 mg/dL — ABNORMAL HIGH (ref 8–23)
CO2: 22 mmol/L (ref 22–32)
Calcium: 8.3 mg/dL — ABNORMAL LOW (ref 8.9–10.3)
Chloride: 111 mmol/L (ref 98–111)
Creatinine, Ser: 1.12 mg/dL — ABNORMAL HIGH (ref 0.44–1.00)
GFR, Estimated: 52 mL/min — ABNORMAL LOW (ref >60)
Glucose, Bld: 273 mg/dL — ABNORMAL HIGH (ref 70–99)
Potassium: 3.8 mmol/L (ref 3.5–5.1)
Sodium: 141 mmol/L (ref 135–145)

## 2023-06-09 LAB — CULTURE, BLOOD (SINGLE): Culture: NO GROWTH

## 2023-06-09 LAB — GLUCOSE, CAPILLARY
Glucose-Capillary: 254 mg/dL — ABNORMAL HIGH (ref 70–99)
Glucose-Capillary: 197 mg/dL — ABNORMAL HIGH (ref 70–99)
Glucose-Capillary: 140 mg/dL — ABNORMAL HIGH (ref 70–99)
Glucose-Capillary: 206 mg/dL — ABNORMAL HIGH (ref 70–99)

## 2023-06-09 LAB — CULTURE, BLOOD (ROUTINE X 2)
Culture: NO GROWTH
Special Requests: ADEQUATE

## 2023-06-09 LAB — MAGNESIUM: Magnesium: 2.1 mg/dL (ref 1.7–2.4)

## 2023-06-09 MED ORDER — METOPROLOL TARTRATE 25 MG PO TABS: 12.5000 mg | ORAL_TABLET | Freq: Every morning | ORAL | Status: DC

## 2023-06-09 MED ORDER — ESCITALOPRAM OXALATE 20 MG PO TABS: 20.0000 mg | ORAL_TABLET | Freq: Every day | ORAL | Status: DC

## 2023-06-09 NOTE — Discharge Summary (Addendum)
Physician Discharge Summary   Patient: Dawn Ho MRN: [id number] DOB: 12/28/1950  Admit date:     06/04/2023  Discharge date: 06/09/23  Discharge Physician: Onalee Hua Krystina Strieter   PCP: Bernerd Limbo, MD   Recommendations at discharge:   Please follow up with primary care provider within 1-2 weeks  Please repeat BMP and CBC in one week    Hospital Course: 72 year old female with history of dementia, CVA with left hemiparesis, hypertension, hyperlipidemia, diabetes mellitus type 2, anxiety/depression/fibromyalgia, bilateral staghorn calculi, and right femoral fracture status post IM nail 08/2020 presents from [facility] secondary to abnormal labs, altered mental status, and decreased oral intake.  The patient was noted to have elevated serum creatinine and hyponatremia.  Apparently, the patient is nursing facility had placed a PICC line for IV antibiotics and IV fluids prior to admission. At baseline, the patient is usually alert oriented x 2, usually oriented to self and place.  In the ED, the patient was afebrile and hemodynamically stable.  WBC 18.2, hemoglobin 13.6, platelets 253.  Sodium 156, serum creatinine 1.61.  CT brain was negative.  Chest x-ray was negative for any acute findings.  UA >50 WBC.   the patient was started on IV fluids and IV meropenem initially.  Assessment and Plan:  Sepsis -Present on admission -Presented with leukocytosis, soft blood pressures, and fever up to 100.4 F. -Secondary to UTI -UA > 50 WBC -06/04/2023 blood cultures negative -Lactic acid 1.9>> 1.6 -Chest x-ray--no infiltrates -sepsis physiology resolved   UTI -Urine culture showed Enterococcus faecalis -Continue linezolid -Holding Lexapro while taking linezolid--restart lexaprol on 10/25 -last dose of linezolid on 06/13/23   Acute kidney injury -Baseline creatinine 1.0-1.2 -Secondary to volume depletion -Patient presented with serum creatinine 1.61 -serum creatinine 1.12 on day of  dc   Acute metabolic encephalopathy -Multifactorial including sepsis/UTI, acute kidney injury, hypernatremia -Ammonia 26 -B12 1048 -TSH 1.769 -06/04/2023 CT brain negative -06/04/2023 VBG 7.3 0/41/51/23 -mental state at baseline on day of dc   Hypernatremia -Presented with sodium 156 -Restart hypotonic fluid -Hold furosemide--do not plan to restart as she has poor po intake at baseline -Na 141 on day of d/c   Uncontrolled diabetes mellitus type 2 with hyperglycemia -06/04/2023 hemoglobin A1c 8.3 -Continue Semglee 10 units -Continue NovoLog sliding scale   Essential hypertension -Reduce dose of metoprolol secondary to soft blood pressure   Metabolic acidosis -Presented with bicarb 20 -Improved   Hypokalemia -repleted  Urine retention -foley initially place -removed 10/19 -pt is able to urinate spontaneously  Personal history of PCN allergy -pt is able to tolerate carbapenems        Consultants: none Procedures performed: none  Disposition: Skilled nursing facility Diet recommendation:  Carb modified diet DISCHARGE MEDICATION: Allergies as of 06/09/2023       Reactions   Penicillins Rash, Swelling   Iodinated Contrast Media    Per pt: swelling with first contrast xray in 1969. Second contrast xray in the 90s without symptoms:  Patient has been pre-medicated for CT scans in 2016. Needs to have pre-medications before any scans with IV contrast. / TSF 08/30/16 Per pt: swelling with first contrast xray in 1969. Second contrast xray in the 90s without symptoms:  Patient has been pre-medicated for CT scans in 2016. Needs to have pre-medications before any scans with IV contrast. / TSF 08/30/16        Medication List     STOP taking these medications    cefTRIAXone 2 g injection Commonly known  as: ROCEPHIN   ertapenem 1 g injection Commonly known as: INVANZ   furosemide 20 MG tablet Commonly known as: LASIX   lisinopril 2.5 MG tablet Commonly known  as: ZESTRIL   potassium chloride 20 MEQ/15ML (10%) Soln   sodium chloride 0.9 % infusion   SOLU-Medrol (PF) 125 MG injection ACT-O-VIAL Generic drug: methylPREDNISolone sodium succinate       TAKE these medications    acetaminophen 325 MG tablet Commonly known as: TYLENOL Take 650 mg by mouth every 6 (six) hours as needed.   acetaminophen 650 MG suppository Commonly known as: TYLENOL Place 650 mg rectally every 12 (twelve) hours as needed for fever or mild pain (pain score 1-3). For 3 days   atorvastatin 10 MG tablet Commonly known as: LIPITOR Take 10 mg by mouth daily.   clopidogrel 75 MG tablet Commonly known as: PLAVIX Take 1 tablet (75 mg total) by mouth daily.   escitalopram 20 MG tablet Commonly known as: LEXAPRO Take 1 tablet (20 mg total) by mouth daily. Restart on 06/14/23 Start taking on: June 14, 2023 What changed:  additional instructions These instructions start on June 14, 2023. If you are unsure what to do until then, ask your doctor or other care provider.   famotidine 20 MG tablet Commonly known as: PEPCID   gabapentin 300 MG capsule Commonly known as: NEURONTIN Take 1 capsule (300 mg total) by mouth at bedtime.   gabapentin 400 MG capsule Commonly known as: NEURONTIN Take 400 mg by mouth.   heparin flush 10 UNIT/ML Soln injection Inject 3 mLs into the vein every 12 (twelve) hours.   insulin glargine 100 UNIT/ML injection Commonly known as: LANTUS Inject 0.1 mLs (10 Units total) into the skin at bedtime. What changed: how much to take   insulin lispro 100 UNIT/ML KwikPen Commonly known as: HUMALOG Inject 0-12 Units into the skin 3 (three) times daily. Inject as per sliding scale: If 100-150=0; 151-200=2; 201-250=4; 251-300=6; 301-350=8; 351-400=10; For CBG greater than 400 give 12 units and call provider.   levothyroxine 50 MCG tablet Commonly known as: SYNTHROID Take 50 mcg by mouth daily.   linezolid 600 MG  tablet Commonly known as: ZYVOX Take 1 tablet (600 mg total) by mouth every 12 (twelve) hours for 6 days.   LORazepam 0.5 MG tablet Commonly known as: ATIVAN Take one tablet by mouth twice daily for anxiety What changed:  how much to take how to take this when to take this additional instructions   melatonin 3 MG Tabs tablet Take 6 mg by mouth daily as needed (sleep).   metoprolol tartrate 50 MG tablet Commonly known as: LOPRESSOR Take 50 mg by mouth in the morning. Patient takes 50mg  in the morning and 25mg  in the evening What changed: Another medication with the same name was added. Make sure you understand how and when to take each.   metoprolol tartrate 25 MG tablet Commonly known as: LOPRESSOR Take 0.5 tablets (12.5 mg total) by mouth every morning. What changed: You were already taking a medication with the same name, and this prescription was added. Make sure you understand how and when to take each.   multivitamin with minerals tablet Take 1 tablet by mouth daily.   nitroGLYCERIN 0.4 MG SL tablet Commonly known as: NITROSTAT Place 0.4 mg under the tongue every 5 (five) minutes as needed for chest pain.   nystatin powder Commonly known as: MYCOSTATIN/NYSTOP Apply topically 2 (two) times daily.   sennosides-docusate sodium 8.6-50 MG tablet  Commonly known as: SENOKOT-S Take 1 tablet by mouth daily.        Follow-up Information     Bernerd Limbo, MD. Schedule an appointment as soon as possible for a visit in 1 week(s).   Specialty: Internal Medicine Contact information: [address] [place] [address] [phone]                Discharge Exam: Ceasar Mons Weights   06/04/23 1247  Weight: 73.5 kg   HEENT:  [place]/AT, No thrush, no icterus CV:  bibasilar rales.  No wheeze Abd:  soft/+BS, NT Ext:  No edema, no lymphangitis, no synovitis, no rash   Condition at discharge: stable  The results of significant diagnostics from this  hospitalization (including imaging, microbiology, ancillary and laboratory) are listed below for reference.   Imaging Studies: CT HEAD WO CONTRAST ( )  Result Date: 06/04/2023 CLINICAL DATA:  Mental status change of unknown cause EXAM: CT HEAD WITHOUT CONTRAST TECHNIQUE: Contiguous axial images were obtained from the base of the skull through the vertex without intravenous contrast. RADIATION DOSE REDUCTION: This exam was performed according to the departmental dose-optimization program which includes automated exposure control, adjustment of the mA and/or kV according to patient size and/or use of iterative reconstruction technique. COMPARISON:  02/21/2019 FINDINGS: Brain: Age related volume loss. Chronic small-vessel ischemic changes affect the pons. No focal cerebellar insult. Generalized atrophy of the cerebral hemispheres. Chronic small-vessel ischemic changes throughout the white matter. Old left basal ganglia/radiating white matter tract infarction. Old subcortical infarction at the right parietal vertex. No sign of acute infarction, mass lesion, hemorrhage, hydrocephalus or extra-axial collection. Vascular: There is atherosclerotic calcification of the major vessels at the base of the brain. Skull: Negative Sinuses/Orbits: Clear/normal Other: None IMPRESSION: No acute finding by CT. Atrophy and chronic small-vessel ischemic changes as outlined above. Old left basal ganglia/radiating white matter tract infarction. Old subcortical infarction at the right parietal vertex. Electronically Signed   By: Paulina Fusi M.D.   On: 06/04/2023 18:17   DG Chest Port 1 View  Result Date: 06/04/2023 CLINICAL DATA:  Weakness EXAM: PORTABLE CHEST 1 VIEW COMPARISON:  XR 09/09/20. FINDINGS: Film is rotated to the right. Underinflation. No consolidation, pneumothorax or effusion. No edema. Normal cardiopericardial silhouette. Old right-sided rib fractures. Loop recorder overlying the midthorax. Calcified aorta.  Overlapping cardiac leads. IMPRESSION: Rotated radiograph. Loop recorder. No acute cardiopulmonary disease. Electronically Signed   By: Karen Kays M.D.   On: 06/04/2023 15:19    Microbiology: Results for orders placed or performed during the hospital encounter of 06/04/23  Urine Culture     Status: Abnormal   Collection Time: 06/04/23  1:21 PM   Specimen: Urine, Random  Result Value Ref Range Status   Specimen Description   Final    URINE, RANDOM Performed at [facility], [address], [place], [place] [address]    Special Requests   Final    NONE Performed at [facility], [address] [place], [place] [address]    Culture 40,000 COLONIES/mL ENTEROCOCCUS FAECALIS (A)  Final   Report Status 06/07/2023 FINAL  Final   Organism ID, Bacteria ENTEROCOCCUS FAECALIS (A)  Final      Susceptibility   Enterococcus faecalis - MIC*    AMPICILLIN <=2 SENSITIVE Sensitive     NITROFURANTOIN <=16 SENSITIVE Sensitive     VANCOMYCIN 1 SENSITIVE Sensitive     * 40,000 COLONIES/mL ENTEROCOCCUS FAECALIS  Culture, blood (single)     Status: None  Collection Time: 06/04/23  2:53 PM   Specimen: BLOOD  Result Value Ref Range Status   Specimen Description BLOOD PICC LINE  Final   Special Requests   Final    BOTTLES DRAWN AEROBIC AND ANAEROBIC Blood Culture results may not be optimal due to an excessive volume of blood received in culture bottles   Culture   Final    NO GROWTH 5 DAYS Performed at [facility], [address] [place], [place] [address]    Report Status 06/09/2023 FINAL  Final  Culture, blood (Routine X 2) w Reflex to ID Panel     Status: None   Collection Time: 06/04/23  3:25 PM   Specimen: BLOOD  Result Value Ref Range Status   Specimen Description BLOOD PICC LINE  Final   Special Requests   Final    BOTTLES DRAWN AEROBIC AND ANAEROBIC Blood Culture adequate volume   Culture   Final    NO GROWTH 5 DAYS Performed at [facility], [address], [place], [place] [address]     Report Status 06/09/2023 FINAL  Final  MRSA Next Gen by PCR, Nasal     Status: None   Collection Time: 06/07/23  4:40 AM   Specimen: Nasal Mucosa; Nasal Swab  Result Value Ref Range Status   MRSA by PCR Next Gen NOT DETECTED NOT DETECTED Final    Comment: (NOTE) The GeneXpert MRSA Assay (FDA approved for NASAL specimens only), is one component of a comprehensive MRSA colonization surveillance program. It is not intended to diagnose MRSA infection nor to guide or monitor treatment for MRSA infections. Test performance is not FDA approved in patients less than 43 years old. Performed at [facility], [address], [place], [place] [address]     Labs: CBC: Recent Labs  Lab 06/04/23 1250 06/05/23 0409 06/06/23 0958 06/07/23 0410  WBC 18.2* 18.1* 10.3 10.0  NEUTROABS 14.6*  --  7.3 5.3  HGB 13.6 12.1 11.6* 10.4*  HCT 43.0 41.7 36.8 33.9*  MCV 96.0 100.5* 96.6 95.2  PLT 253 189 159 148*   Basic Metabolic Panel: Recent Labs  Lab 06/04/23 1630 06/04/23 2216 06/05/23 2036 06/06/23 0217 06/07/23 0410 06/08/23 0434 06/09/23 0450  NA 156*   < > 151* 151* 148* 149* 141  K 3.4*   < > 3.6 3.7 3.3* 4.1 3.8  CL 126*   < > 124* 125* 119* 118* 111  CO2 22   < > 20* 20* 21* 23 22  GLUCOSE 262*   < > 115* 132* 207* 172* 273*  BUN 61*   < > 41* 41* 31* 27* 26*  CREATININE 1.61*   < > 1.05* 1.15* 1.08* 1.13* 1.12*  CALCIUM 9.3   < > 8.3* 8.2* 8.3* 8.8* 8.3*  MG 2.6*  --   --   --   --   --  2.1   < > = values in this interval not displayed.   Liver Function Tests: Recent Labs  Lab 06/04/23 1250 06/05/23 0409  AST 35 61*  ALT 27 48*  ALKPHOS 66 56  BILITOT 0.5 0.5  PROT 7.2 6.1*  ALBUMIN 3.0* 2.7*   CBG: Recent Labs  Lab 06/08/23 0725 06/08/23 1121 06/08/23 1614 06/08/23 2053 06/09/23 0750  GLUCAP 141* 238* 207* 291* 254*    Discharge time spent: greater than 30 minutes.  Signed: Catarina Hartshorn, MD [facility] 06/09/2023

## 2023-06-09 NOTE — TOC Transition Note (Signed)
Transition of Care [facility]) - CM/SW Discharge Note   Patient Details  Name: Dawn Ho MRN: [id number] Date of Birth: Dec 29, 1950  Transition of Care [facility]) CM/SW Contact:  Villa Herb, LCSWA Phone Number: 06/09/2023, 12:02 PM   Clinical Narrative:    CSW updated that pt is medically stable for D/C back to [facility] today. CSW spoke to Environmental education officer at facility who states they can accept pt back. CSW faxed D/C clinicals and Fl2 to facility. CSW provided RN with number for room and report. Secure VM left for pts son with update. CSW completed med necessity and sent to floor for RN. EMS transport called, pt is on their list for pick up. TOC signing off.   Final next level of care: Long Term Nursing Home Barriers to Discharge: Barriers Resolved   Patient Goals and CMS Choice CMS Medicare.gov Compare Post Acute Care list provided to:: Patient Choice offered to / list presented to : Patient  Discharge Placement                Patient chooses bed at: [facility] Patient to be transferred to facility by: EMS Name of family member notified: Billy Patient and family notified of of transfer: 06/09/23  Discharge Plan and Services Additional resources added to the After Visit Summary for                                       Social Determinants of Health (SDOH) Interventions SDOH Screenings   Food Insecurity: Patient Unable To Answer (06/04/2023)  Housing: Patient Unable To Answer (06/04/2023)  Transportation Needs: Patient Unable To Answer (06/04/2023)  Utilities: Patient Unable To Answer (06/04/2023)  Tobacco Use: Low Risk  (06/04/2023)     Readmission Risk Interventions     No data to display

## 2023-06-09 NOTE — NC FL2 (Signed)
[place] MEDICAID FL2 LEVEL OF CARE FORM     IDENTIFICATION  Patient Name: Dawn Ho Birthdate: 1951/01/17 Sex: female Admission Date (Current Location): 06/04/2023  [facility] and [place] Number:  [employer] and Address:  [facility],  [address], [place] [address]      Provider Number: [id number]  Attending Physician Name and Address:  Catarina Hartshorn, MD  Relative Name and Phone Number:       Current Level of Care: Hospital Recommended Level of Care: Skilled Nursing Facility Prior Approval Number:    Date Approved/Denied:   PASRR Number:    Discharge Plan: SNF    Current Diagnoses: Patient Active Problem List   Diagnosis Date Noted   Acute metabolic encephalopathy 06/04/2023   Uncontrolled type 2 diabetes mellitus with hyperglycemia, with long-term current use of insulin (HCC) 06/04/2023   Hematuria 06/04/2023   Hypernatremia 06/04/2023   AKI (acute kidney injury) (HCC) 06/04/2023   Nephrolithiasis 04/18/2022   Closed right hip fracture, initial encounter (HCC) 09/09/2020   Hypomagnesemia 11/12/2017   Aspiration into airway    Acute respiratory distress    Lung crackles    Ileus (HCC)    Abdominal distension    Leukocytosis    Diarrhea    UTI (urinary tract infection) 10/31/2017   Hypokalemia 10/31/2017   Cryptogenic stroke (HCC) 01/12/2015   Spastic hemiparesis (HCC) 01/12/2015   HLD (hyperlipidemia)    History of recent stroke    Accelerated hypertension    Acute cystitis without hematuria    Tachycardia    Stroke (HCC) 11/17/2014   Left-sided weakness 11/17/2014   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)    CVA (cerebral vascular accident) (HCC) 10/14/2014   TIA (transient ischemic attack) 10/13/2014   Chest pain 08/07/2011   Fibromyalgia 08/07/2011   Costochondritis 08/07/2011   Diabetes mellitus (HCC) 08/07/2011   Depression with anxiety 08/07/2011   Obesity 08/07/2011   History of TIAs  08/07/2011   Hyperlipidemia 08/07/2011    Orientation RESPIRATION BLADDER Height & Weight     Self  Normal Incontinent, Indwelling catheter Weight: 162 lb 0.6 oz (73.5 kg) Height:  5' (152.4 cm)  BEHAVIORAL SYMPTOMS/MOOD NEUROLOGICAL BOWEL NUTRITION STATUS      Incontinent Diet (Carb modified)  AMBULATORY STATUS COMMUNICATION OF NEEDS Skin   Extensive Assist Verbally Normal                       Personal Care Assistance Level of Assistance  Bathing, Feeding, Dressing Bathing Assistance: Limited assistance Feeding assistance: Independent Dressing Assistance: Limited assistance     Functional Limitations Info  Sight, Hearing, Speech Sight Info: Adequate Hearing Info: Adequate Speech Info: Adequate    SPECIAL CARE FACTORS FREQUENCY                       Contractures Contractures Info: Not present    Additional Factors Info  Code Status, Allergies Code Status Info: FULL Allergies Info: penicillins, Iodinated Contrast Media           Current Medications (06/09/2023):  This is the current hospital active medication list Current Facility-Administered Medications  Medication Dose Route Frequency Provider Last Rate Last Admin   Chlorhexidine Gluconate Cloth 2 % PADS 6 each  6 each Topical Daily Almon Hercules, MD   6 each at 06/09/23 0859   clopidogrel (PLAVIX) tablet 75 mg  75 mg Oral Daily Kathlen Mody, MD  75 mg at 06/09/23 0857   gabapentin (NEURONTIN) capsule 300 mg  300 mg Oral QHS Kathlen Mody, MD   300 mg at 06/08/23 2159   gabapentin (NEURONTIN) capsule 400 mg  400 mg Oral Daily Kathlen Mody, MD   400 mg at 06/09/23 0857   heparin injection 5,000 Units  5,000 Units Subcutaneous Q8H Candelaria Stagers T, MD   5,000 Units at 06/09/23 0653   insulin aspart (novoLOG) injection 0-5 Units  0-5 Units Subcutaneous QHS Catarina Hartshorn, MD   4 Units at 06/08/23 2201   insulin aspart (novoLOG) injection 0-9 Units  0-9 Units Subcutaneous TID WC Catarina Hartshorn, MD   5 Units at  06/09/23 0858   insulin glargine-yfgn (SEMGLEE) injection 10 Units  10 Units Subcutaneous QHS Kathlen Mody, MD   10 Units at 06/08/23 2158   levothyroxine (SYNTHROID) tablet 50 mcg  50 mcg Oral Daily Kathlen Mody, MD   50 mcg at 06/09/23 7893   linezolid (ZYVOX) tablet 600 mg  600 mg Oral Q12H Kathlen Mody, MD   600 mg at 06/09/23 0902   LORazepam (ATIVAN) tablet 0.5 mg  0.5 mg Oral BID PRN Kathlen Mody, MD       metoprolol tartrate (LOPRESSOR) tablet 12.5 mg  12.5 mg Oral BID Tat, Onalee Hua, MD   12.5 mg at 06/08/23 2158   nystatin (MYCOSTATIN/NYSTOP) topical powder   Topical BID Frankey Shown, DO   Given at 06/09/23 0859   ondansetron (ZOFRAN) tablet 4 mg  4 mg Oral Q6H PRN Almon Hercules, MD       Or   ondansetron (ZOFRAN) injection 4 mg  4 mg Intravenous Q6H PRN Gonfa, Taye T, MD       sodium chloride flush (NS) 0.9 % injection 3 mL  3 mL Intravenous Q12H Kathlen Mody, MD   3 mL at 06/09/23 0900     Discharge Medications: Please see discharge summary for a list of discharge medications.  Relevant Imaging Results:  Relevant Lab Results:   Additional Information SSN: [id number] [place]

## 2023-06-09 NOTE — Plan of Care (Signed)
  Problem: Coping: Goal: Ability to adjust to condition or change in health will improve Outcome: Progressing   Problem: Clinical Measurements: Goal: Diagnostic test results will improve Outcome: Progressing

## 2023-06-09 NOTE — Progress Notes (Signed)
Patient has had urine output of 75 ml since night shift bladder scanned patient with a result of 150 ml. MD Tat notified.

## 2023-06-10 DIAGNOSIS — E870 Hyperosmolality and hypernatremia: Secondary | ICD-10-CM | POA: Diagnosis not present

## 2023-06-10 DIAGNOSIS — N179 Acute kidney failure, unspecified: Secondary | ICD-10-CM | POA: Diagnosis not present

## 2023-06-10 DIAGNOSIS — G9341 Metabolic encephalopathy: Secondary | ICD-10-CM | POA: Diagnosis not present

## 2023-06-10 DIAGNOSIS — E876 Hypokalemia: Secondary | ICD-10-CM | POA: Diagnosis not present

## 2023-06-10 LAB — BASIC METABOLIC PANEL
Anion gap: 9 (ref 5–15)
BUN: 24 mg/dL — ABNORMAL HIGH (ref 8–23)
CO2: 23 mmol/L (ref 22–32)
Calcium: 8.6 mg/dL — ABNORMAL LOW (ref 8.9–10.3)
Chloride: 111 mmol/L (ref 98–111)
Creatinine, Ser: 1.07 mg/dL — ABNORMAL HIGH (ref 0.44–1.00)
GFR, Estimated: 55 mL/min — ABNORMAL LOW (ref >60)
Glucose, Bld: 163 mg/dL — ABNORMAL HIGH (ref 70–99)
Potassium: 3.8 mmol/L (ref 3.5–5.1)
Sodium: 143 mmol/L (ref 135–145)

## 2023-06-10 LAB — GLUCOSE, CAPILLARY: Glucose-Capillary: 155 mg/dL — ABNORMAL HIGH (ref 70–99)

## 2023-06-10 NOTE — TOC Progression Note (Signed)
Transition of Care [facility]) - Progression Note    Patient Details  Name: Dawn Ho MRN: [id number] Date of Birth: 1951-02-06  Transition of Care [facility]) CM/SW Contact  Elliot Gault, LCSW Phone Number: 06/10/2023, 10:09 AM  Clinical Narrative:      EMS did not come get pt yesterday. She is on the list for transport this AM.  Updated [facility] at [facility]. DC clinical sent electronically.   Barriers to Discharge: Barriers Resolved  Expected Discharge Plan and Services         Expected Discharge Date: 06/09/23                                     Social Determinants of Health (SDOH) Interventions SDOH Screenings   Food Insecurity: Patient Unable To Answer (06/04/2023)  Housing: Patient Unable To Answer (06/04/2023)  Transportation Needs: Patient Unable To Answer (06/04/2023)  Utilities: Patient Unable To Answer (06/04/2023)  Tobacco Use: Low Risk  (06/04/2023)    Readmission Risk Interventions     No data to display

## 2023-06-10 NOTE — Discharge Summary (Signed)
Physician Discharge Summary   Patient: Dawn Ho MRN: [id number] DOB: Jan 12, 1951  Admit date:     06/04/2023  Discharge date: 06/10/23  Discharge Physician: Onalee Hua Raina Sole   PCP: Bernerd Limbo, MD   Recommendations at discharge:   Please follow up with primary care provider within 1-2 weeks  Please repeat BMP and CBC in one week    Hospital Course: 72 year old female with history of dementia, CVA with left hemiparesis, hypertension, hyperlipidemia, diabetes mellitus type 2, anxiety/depression/fibromyalgia, bilateral staghorn calculi, and right femoral fracture status post IM nail 08/2020 presents from [facility] secondary to abnormal labs, altered mental status, and decreased oral intake.  The patient was noted to have elevated serum creatinine and hyponatremia.  Apparently, the patient is nursing facility had placed a PICC line for IV antibiotics and IV fluids prior to admission. At baseline, the patient is usually alert oriented x 2, usually oriented to self and place.  In the ED, the patient was afebrile and hemodynamically stable.  WBC 18.2, hemoglobin 13.6, platelets 253.  Sodium 156, serum creatinine 1.61.  CT brain was negative.  Chest x-ray was negative for any acute findings.  UA >50 WBC.   the patient was started on IV fluids and IV meropenem initially.  She was transitioned to Zyvox.  She improved clinically.  Her mental status improved and returned to baseline.  Her electrolytes were optimized.  She will continue Zyvox 3 additional days after discharge.  Assessment and Plan:   Sepsis -Present on admission -Presented with leukocytosis, soft blood pressures, and fever up to 100.4 F. -Secondary to UTI -UA > 50 WBC -06/04/2023 blood cultures negative -Lactic acid 1.9>> 1.6 -Chest x-ray--no infiltrates -sepsis physiology resolved   UTI -Urine culture showed Enterococcus faecalis -Continue linezolid -Holding Lexapro while taking linezolid--restart lexaprol on  10/25 -last dose of linezolid on 06/13/23   Acute kidney injury -Baseline creatinine 1.0-1.2 -Secondary to volume depletion -Patient presented with serum creatinine 1.61 -serum creatinine 1.12 on day of dc   Acute metabolic encephalopathy -Multifactorial including sepsis/UTI, acute kidney injury, hypernatremia -Ammonia 26 -B12 1048 -TSH 1.769 -06/04/2023 CT brain negative -06/04/2023 VBG 7.3 0/41/51/23 -mental state at baseline on day of dc   Hypernatremia -Presented with sodium 156 -Restart hypotonic fluid -Hold furosemide--do not plan to restart as she has poor po intake at baseline -Na 143 on day of d/c   Uncontrolled diabetes mellitus type 2 with hyperglycemia -06/04/2023 hemoglobin A1c 8.3 -Continue Semglee 10 units -Continue NovoLog sliding scale   Essential hypertension -Reduce dose of metoprolol secondary to soft blood pressure   Metabolic acidosis -Presented with bicarb 20 -Improved to 23   Hypokalemia -repleted   Urine retention -foley initially place -removed 10/19 -pt is able to urinate spontaneously   Personal history of PCN allergy -pt is able to tolerate carbapenems       Consultants: none Procedures performed: none  Disposition: Skilled nursing facility Diet recommendation:  Carb modified diet DISCHARGE MEDICATION: Allergies as of 06/10/2023       Reactions   Penicillins Rash, Swelling   Iodinated Contrast Media    Per pt: swelling with first contrast xray in 1969. Second contrast xray in the 90s without symptoms:  Patient has been pre-medicated for CT scans in 2016. Needs to have pre-medications before any scans with IV contrast. / TSF 08/30/16 Per pt: swelling with first contrast xray in 1969. Second contrast xray in the 90s without symptoms:  Patient has been pre-medicated for CT scans in 2016. Needs  to have pre-medications before any scans with IV contrast. / TSF 08/30/16        Medication List     STOP taking these medications     cefTRIAXone 2 g injection Commonly known as: ROCEPHIN   ertapenem 1 g injection Commonly known as: INVANZ   furosemide 20 MG tablet Commonly known as: LASIX   lisinopril 2.5 MG tablet Commonly known as: ZESTRIL   potassium chloride 20 MEQ/15ML (10%) Soln   sodium chloride 0.9 % infusion   SOLU-Medrol (PF) 125 MG injection ACT-O-VIAL Generic drug: methylPREDNISolone sodium succinate       TAKE these medications    acetaminophen 325 MG tablet Commonly known as: TYLENOL Take 650 mg by mouth every 6 (six) hours as needed.   acetaminophen 650 MG suppository Commonly known as: TYLENOL Place 650 mg rectally every 12 (twelve) hours as needed for fever or mild pain (pain score 1-3). For 3 days   atorvastatin 10 MG tablet Commonly known as: LIPITOR Take 10 mg by mouth daily.   clopidogrel 75 MG tablet Commonly known as: PLAVIX Take 1 tablet (75 mg total) by mouth daily.   escitalopram 20 MG tablet Commonly known as: LEXAPRO Take 1 tablet (20 mg total) by mouth daily. Restart on 06/14/23 Start taking on: June 14, 2023 What changed:  additional instructions These instructions start on June 14, 2023. If you are unsure what to do until then, ask your doctor or other care provider.   famotidine 20 MG tablet Commonly known as: PEPCID   gabapentin 300 MG capsule Commonly known as: NEURONTIN Take 1 capsule (300 mg total) by mouth at bedtime.   gabapentin 400 MG capsule Commonly known as: NEURONTIN Take 400 mg by mouth.   heparin flush 10 UNIT/ML Soln injection Inject 3 mLs into the vein every 12 (twelve) hours.   insulin glargine 100 UNIT/ML injection Commonly known as: LANTUS Inject 0.1 mLs (10 Units total) into the skin at bedtime. What changed: how much to take   insulin lispro 100 UNIT/ML KwikPen Commonly known as: HUMALOG Inject 0-12 Units into the skin 3 (three) times daily. Inject as per sliding scale: If  100-150=0; 151-200=2; 201-250=4; 251-300=6; 301-350=8; 351-400=10; For CBG greater than 400 give 12 units and call provider.   levothyroxine 50 MCG tablet Commonly known as: SYNTHROID Take 50 mcg by mouth daily.   linezolid 600 MG tablet Commonly known as: ZYVOX Take 1 tablet (600 mg total) by mouth every 12 (twelve) hours for 6 days.   LORazepam 0.5 MG tablet Commonly known as: ATIVAN Take one tablet by mouth twice daily for anxiety What changed:  how much to take how to take this when to take this additional instructions   melatonin 3 MG Tabs tablet Take 6 mg by mouth daily as needed (sleep).   metoprolol tartrate 50 MG tablet Commonly known as: LOPRESSOR Take 50 mg by mouth in the morning. Patient takes 50mg  in the morning and 25mg  in the evening What changed: Another medication with the same name was added. Make sure you understand how and when to take each.   metoprolol tartrate 25 MG tablet Commonly known as: LOPRESSOR Take 0.5 tablets (12.5 mg total) by mouth every morning. What changed: You were already taking a medication with the same name, and this prescription was added. Make sure you understand how and when to take each.   multivitamin with minerals tablet Take 1 tablet by mouth daily.   nitroGLYCERIN 0.4 MG SL tablet Commonly known  as: NITROSTAT Place 0.4 mg under the tongue every 5 (five) minutes as needed for chest pain.   nystatin powder Commonly known as: MYCOSTATIN/NYSTOP Apply topically 2 (two) times daily.   sennosides-docusate sodium 8.6-50 MG tablet Commonly known as: SENOKOT-S Take 1 tablet by mouth daily.        Follow-up Information     Bernerd Limbo, MD. Schedule an appointment as soon as possible for a visit in 1 week(s).   Specialty: Internal Medicine Contact information: [address] [place] [address] [phone]                Discharge Exam: Ceasar Mons Weights   06/04/23 1247  Weight: 73.5 kg    HEENT:  [place]/AT, No thrush, no icterus CV:  RRR, no rub, no S3, no S4 Lung:  bibasilar rales.  No wheeze Abd:  soft/+BS, NT Ext:  No edema, no lymphangitis, no synovitis, no rash   Condition at discharge: stable  The results of significant diagnostics from this hospitalization (including imaging, microbiology, ancillary and laboratory) are listed below for reference.   Imaging Studies: CT HEAD WO CONTRAST ( )  Result Date: 06/04/2023 CLINICAL DATA:  Mental status change of unknown cause EXAM: CT HEAD WITHOUT CONTRAST TECHNIQUE: Contiguous axial images were obtained from the base of the skull through the vertex without intravenous contrast. RADIATION DOSE REDUCTION: This exam was performed according to the departmental dose-optimization program which includes automated exposure control, adjustment of the mA and/or kV according to patient size and/or use of iterative reconstruction technique. COMPARISON:  02/21/2019 FINDINGS: Brain: Age related volume loss. Chronic small-vessel ischemic changes affect the pons. No focal cerebellar insult. Generalized atrophy of the cerebral hemispheres. Chronic small-vessel ischemic changes throughout the white matter. Old left basal ganglia/radiating white matter tract infarction. Old subcortical infarction at the right parietal vertex. No sign of acute infarction, mass lesion, hemorrhage, hydrocephalus or extra-axial collection. Vascular: There is atherosclerotic calcification of the major vessels at the base of the brain. Skull: Negative Sinuses/Orbits: Clear/normal Other: None IMPRESSION: No acute finding by CT. Atrophy and chronic small-vessel ischemic changes as outlined above. Old left basal ganglia/radiating white matter tract infarction. Old subcortical infarction at the right parietal vertex. Electronically Signed   By: Paulina Fusi M.D.   On: 06/04/2023 18:17   DG Chest Port 1 View  Result Date: 06/04/2023 CLINICAL DATA:  Weakness EXAM: PORTABLE  CHEST 1 VIEW COMPARISON:  XR 09/09/20. FINDINGS: Film is rotated to the right. Underinflation. No consolidation, pneumothorax or effusion. No edema. Normal cardiopericardial silhouette. Old right-sided rib fractures. Loop recorder overlying the midthorax. Calcified aorta. Overlapping cardiac leads. IMPRESSION: Rotated radiograph. Loop recorder. No acute cardiopulmonary disease. Electronically Signed   By: Karen Kays M.D.   On: 06/04/2023 15:19    Microbiology: Results for orders placed or performed during the hospital encounter of 06/04/23  Urine Culture     Status: Abnormal   Collection Time: 06/04/23  1:21 PM   Specimen: Urine, Random  Result Value Ref Range Status   Specimen Description   Final    URINE, RANDOM Performed at [facility], [address], [place], [place] [address]    Special Requests   Final    NONE Performed at [facility], [address], [place], [place] [address]    Culture 40,000 COLONIES/mL ENTEROCOCCUS FAECALIS (A)  Final   Report Status 06/07/2023 FINAL  Final   Organism ID, Bacteria ENTEROCOCCUS FAECALIS (A)  Final      Susceptibility  Enterococcus faecalis - MIC*    AMPICILLIN <=2 SENSITIVE Sensitive     NITROFURANTOIN <=16 SENSITIVE Sensitive     VANCOMYCIN 1 SENSITIVE Sensitive     * 40,000 COLONIES/mL ENTEROCOCCUS FAECALIS  Culture, blood (single)     Status: None   Collection Time: 06/04/23  2:53 PM   Specimen: BLOOD  Result Value Ref Range Status   Specimen Description BLOOD PICC LINE  Final   Special Requests   Final    BOTTLES DRAWN AEROBIC AND ANAEROBIC Blood Culture results may not be optimal due to an excessive volume of blood received in culture bottles   Culture   Final    NO GROWTH 5 DAYS Performed at [facility], [address] [place], [place] [address]    Report Status 06/09/2023 FINAL  Final  Culture, blood (Routine X 2) w Reflex to ID Panel     Status: None   Collection Time: 06/04/23  3:25 PM   Specimen: BLOOD  Result Value Ref  Range Status   Specimen Description BLOOD PICC LINE  Final   Special Requests   Final    BOTTLES DRAWN AEROBIC AND ANAEROBIC Blood Culture adequate volume   Culture   Final    NO GROWTH 5 DAYS Performed at [facility], [address], [place], [place] [address]    Report Status 06/09/2023 FINAL  Final  MRSA Next Gen by PCR, Nasal     Status: None   Collection Time: 06/07/23  4:40 AM   Specimen: Nasal Mucosa; Nasal Swab  Result Value Ref Range Status   MRSA by PCR Next Gen NOT DETECTED NOT DETECTED Final    Comment: (NOTE) The GeneXpert MRSA Assay (FDA approved for NASAL specimens only), is one component of a comprehensive MRSA colonization surveillance program. It is not intended to diagnose MRSA infection nor to guide or monitor treatment for MRSA infections. Test performance is not FDA approved in patients less than 31 years old. Performed at [facility], [address], [place], [place] [address]     Labs: CBC: Recent Labs  Lab 06/04/23 1250 06/05/23 0409 06/06/23 0958 06/07/23 0410  WBC 18.2* 18.1* 10.3 10.0  NEUTROABS 14.6*  --  7.3 5.3  HGB 13.6 12.1 11.6* 10.4*  HCT 43.0 41.7 36.8 33.9*  MCV 96.0 100.5* 96.6 95.2  PLT 253 189 159 148*   Basic Metabolic Panel: Recent Labs  Lab 06/04/23 1630 06/04/23 2216 06/06/23 0217 06/07/23 0410 06/08/23 0434 06/09/23 0450 06/10/23 0520  NA 156*   < > 151* 148* 149* 141 143  K 3.4*   < > 3.7 3.3* 4.1 3.8 3.8  CL 126*   < > 125* 119* 118* 111 111  CO2 22   < > 20* 21* 23 22 23   GLUCOSE 262*   < > 132* 207* 172* 273* 163*  BUN 61*   < > 41* 31* 27* 26* 24*  CREATININE 1.61*   < > 1.15* 1.08* 1.13* 1.12* 1.07*  CALCIUM 9.3   < > 8.2* 8.3* 8.8* 8.3* 8.6*  MG 2.6*  --   --   --   --  2.1  --    < > = values in this interval not displayed.   Liver Function Tests: Recent Labs  Lab 06/04/23 1250 06/05/23 0409  AST 35 61*  ALT 27 48*  ALKPHOS 66 56  BILITOT 0.5 0.5  PROT 7.2 6.1*  ALBUMIN 3.0* 2.7*    CBG: Recent Labs  Lab 06/08/23 2053  06/09/23 0750 06/09/23 1145 06/09/23 1629 06/09/23 2122  GLUCAP 291* 254* 197* 140* 206*    Discharge time spent: greater than 30 minutes.  Signed: Catarina Hartshorn, MD [facility] 06/10/2023

## 2023-06-10 NOTE — Plan of Care (Signed)
Patient has been discharged. Awaiting transport for pickup back to [facility]. Voiding during the night with purewick in place. Repositioned during the night. Took meds crushed in applesauce without any problems.   Problem: Education: Goal: Individualized Educational Video(s) Outcome: Progressing   Problem: Coping: Goal: Ability to adjust to condition or change in health will improve Outcome: Progressing   Problem: Fluid Volume: Goal: Ability to maintain a balanced intake and output will improve Outcome: Progressing   Problem: Health Behavior/Discharge Planning: Goal: Ability to identify and utilize available resources and services will improve Outcome: Progressing Goal: Ability to manage health-related needs will improve Outcome: Progressing   Problem: Metabolic: Goal: Ability to maintain appropriate glucose levels will improve Outcome: Progressing   Problem: Nutritional: Goal: Maintenance of adequate nutrition will improve Outcome: Progressing Goal: Progress toward achieving an optimal weight will improve Outcome: Progressing   Problem: Skin Integrity: Goal: Risk for impaired skin integrity will decrease Outcome: Progressing   Problem: Tissue Perfusion: Goal: Adequacy of tissue perfusion will improve Outcome: Progressing   Problem: Health Behavior/Discharge Planning: Goal: Ability to manage health-related needs will improve Outcome: Progressing   Problem: Clinical Measurements: Goal: Ability to maintain clinical measurements within normal limits will improve Outcome: Progressing Goal: Will remain free from infection Outcome: Progressing Goal: Diagnostic test results will improve Outcome: Progressing Goal: Respiratory complications will improve Outcome: Progressing Goal: Cardiovascular complication will be avoided Outcome: Progressing   Problem: Activity: Goal: Risk for activity intolerance will decrease Outcome: Progressing   Problem: Nutrition: Goal:  Adequate nutrition will be maintained Outcome: Progressing   Problem: Coping: Goal: Level of anxiety will decrease Outcome: Progressing   Problem: Elimination: Goal: Will not experience complications related to bowel motility Outcome: Progressing Goal: Will not experience complications related to urinary retention Outcome: Progressing   Problem: Pain Managment: Goal: General experience of comfort will improve Outcome: Progressing   Problem: Safety: Goal: Ability to remain free from injury will improve Outcome: Progressing   Problem: Skin Integrity: Goal: Risk for impaired skin integrity will decrease Outcome: Progressing   Problem: Education: Goal: Ability to describe self-care measures that may prevent or decrease complications (Diabetes Survival Skills Education) will improve Outcome: Not Progressing   Problem: Education: Goal: Knowledge of General Education information will improve Description: Including pain rating scale, medication(s)/side effects and non-pharmacologic comfort measures Outcome: Not Progressing

## 2023-06-15 ENCOUNTER — Other Ambulatory Visit: Payer: Self-pay

## 2023-06-15 ENCOUNTER — Emergency Department (HOSPITAL_COMMUNITY): Payer: Medicare Other

## 2023-06-15 ENCOUNTER — Inpatient Hospital Stay (HOSPITAL_COMMUNITY)
Admission: EM | Admit: 2023-06-15 | Discharge: 2023-06-21 | DRG: 871 | Disposition: A | Discharge status: Skilled Nursing Facility | Payer: Medicare Other | Source: Skilled Nursing Facility | Attending: Internal Medicine | Admitting: Internal Medicine

## 2023-06-15 ENCOUNTER — Encounter (HOSPITAL_COMMUNITY): Payer: Self-pay | Admitting: Emergency Medicine

## 2023-06-15 DIAGNOSIS — A4151 Sepsis due to Escherichia coli [E. coli]: Secondary | ICD-10-CM | POA: Diagnosis not present

## 2023-06-15 DIAGNOSIS — F0393 Unspecified dementia, unspecified severity, with mood disturbance: Secondary | ICD-10-CM | POA: Diagnosis present

## 2023-06-15 DIAGNOSIS — N390 Urinary tract infection, site not specified: Secondary | ICD-10-CM | POA: Diagnosis present

## 2023-06-15 DIAGNOSIS — F418 Other specified anxiety disorders: Secondary | ICD-10-CM | POA: Diagnosis not present

## 2023-06-15 DIAGNOSIS — E872 Acidosis, unspecified: Secondary | ICD-10-CM | POA: Diagnosis present

## 2023-06-15 DIAGNOSIS — Z1612 Extended spectrum beta lactamase (ESBL) resistance: Secondary | ICD-10-CM | POA: Diagnosis present

## 2023-06-15 DIAGNOSIS — I10 Essential (primary) hypertension: Secondary | ICD-10-CM | POA: Diagnosis present

## 2023-06-15 DIAGNOSIS — E66811 Obesity, class 1: Secondary | ICD-10-CM | POA: Diagnosis present

## 2023-06-15 DIAGNOSIS — E785 Hyperlipidemia, unspecified: Secondary | ICD-10-CM | POA: Diagnosis present

## 2023-06-15 DIAGNOSIS — E11319 Type 2 diabetes mellitus with unspecified diabetic retinopathy without macular edema: Secondary | ICD-10-CM | POA: Diagnosis present

## 2023-06-15 DIAGNOSIS — G9341 Metabolic encephalopathy: Secondary | ICD-10-CM | POA: Diagnosis present

## 2023-06-15 DIAGNOSIS — M797 Fibromyalgia: Secondary | ICD-10-CM | POA: Diagnosis present

## 2023-06-15 DIAGNOSIS — Z87442 Personal history of urinary calculi: Secondary | ICD-10-CM

## 2023-06-15 DIAGNOSIS — W19XXXA Unspecified fall, initial encounter: Secondary | ICD-10-CM | POA: Diagnosis present

## 2023-06-15 DIAGNOSIS — Z8249 Family history of ischemic heart disease and other diseases of the circulatory system: Secondary | ICD-10-CM

## 2023-06-15 DIAGNOSIS — Z1152 Encounter for screening for COVID-19: Secondary | ICD-10-CM

## 2023-06-15 DIAGNOSIS — E870 Hyperosmolality and hypernatremia: Secondary | ICD-10-CM | POA: Diagnosis present

## 2023-06-15 DIAGNOSIS — Z794 Long term (current) use of insulin: Secondary | ICD-10-CM

## 2023-06-15 DIAGNOSIS — N179 Acute kidney failure, unspecified: Secondary | ICD-10-CM | POA: Diagnosis present

## 2023-06-15 DIAGNOSIS — I259 Chronic ischemic heart disease, unspecified: Secondary | ICD-10-CM | POA: Diagnosis present

## 2023-06-15 DIAGNOSIS — B9620 Unspecified Escherichia coli [E. coli] as the cause of diseases classified elsewhere: Secondary | ICD-10-CM | POA: Diagnosis present

## 2023-06-15 DIAGNOSIS — A419 Sepsis, unspecified organism: Principal | ICD-10-CM | POA: Diagnosis present

## 2023-06-15 DIAGNOSIS — R6520 Severe sepsis without septic shock: Secondary | ICD-10-CM | POA: Diagnosis present

## 2023-06-15 DIAGNOSIS — Z90710 Acquired absence of both cervix and uterus: Secondary | ICD-10-CM

## 2023-06-15 DIAGNOSIS — E039 Hypothyroidism, unspecified: Secondary | ICD-10-CM | POA: Diagnosis present

## 2023-06-15 DIAGNOSIS — E1165 Type 2 diabetes mellitus with hyperglycemia: Secondary | ICD-10-CM | POA: Diagnosis present

## 2023-06-15 DIAGNOSIS — F32A Depression, unspecified: Secondary | ICD-10-CM | POA: Diagnosis present

## 2023-06-15 DIAGNOSIS — Z91041 Radiographic dye allergy status: Secondary | ICD-10-CM

## 2023-06-15 DIAGNOSIS — Z6831 Body mass index (BMI) 31.0-31.9, adult: Secondary | ICD-10-CM | POA: Diagnosis not present

## 2023-06-15 DIAGNOSIS — Z880 Allergy status to penicillin: Secondary | ICD-10-CM

## 2023-06-15 DIAGNOSIS — E669 Obesity, unspecified: Secondary | ICD-10-CM | POA: Diagnosis present

## 2023-06-15 DIAGNOSIS — Z79890 Hormone replacement therapy (postmenopausal): Secondary | ICD-10-CM

## 2023-06-15 DIAGNOSIS — I69354 Hemiplegia and hemiparesis following cerebral infarction affecting left non-dominant side: Secondary | ICD-10-CM

## 2023-06-15 DIAGNOSIS — F0390 Unspecified dementia without behavioral disturbance: Secondary | ICD-10-CM | POA: Diagnosis not present

## 2023-06-15 DIAGNOSIS — E860 Dehydration: Secondary | ICD-10-CM | POA: Diagnosis present

## 2023-06-15 DIAGNOSIS — F0394 Unspecified dementia, unspecified severity, with anxiety: Secondary | ICD-10-CM | POA: Diagnosis present

## 2023-06-15 DIAGNOSIS — Z79899 Other long term (current) drug therapy: Secondary | ICD-10-CM

## 2023-06-15 DIAGNOSIS — Z7189 Other specified counseling: Secondary | ICD-10-CM | POA: Diagnosis not present

## 2023-06-15 DIAGNOSIS — Z515 Encounter for palliative care: Secondary | ICD-10-CM | POA: Diagnosis not present

## 2023-06-15 DIAGNOSIS — Z7902 Long term (current) use of antithrombotics/antiplatelets: Secondary | ICD-10-CM

## 2023-06-15 DIAGNOSIS — R4182 Altered mental status, unspecified: Secondary | ICD-10-CM

## 2023-06-15 LAB — URINALYSIS, W/ REFLEX TO CULTURE (INFECTION SUSPECTED)
Bilirubin Urine: NEGATIVE
Glucose, UA: NEGATIVE mg/dL
Ketones, ur: NEGATIVE mg/dL
Nitrite: NEGATIVE
Protein, ur: 100 mg/dL — AB
RBC / HPF: >50 RBC/hpf (ref 0–5)
Specific Gravity, Urine: 1.015 (ref 1.005–1.030)
WBC, UA: >50 WBC/hpf (ref 0–5)
pH: 6 (ref 5.0–8.0)

## 2023-06-15 LAB — AMMONIA: Ammonia: 17 umol/L (ref 9–35)

## 2023-06-15 LAB — RESP PANEL BY RT-PCR (RSV, FLU A&B, COVID)  RVPGX2
Influenza A by PCR: NEGATIVE
Influenza B by PCR: NEGATIVE
Resp Syncytial Virus by PCR: NEGATIVE
SARS Coronavirus 2 by RT PCR: NEGATIVE

## 2023-06-15 LAB — COMPREHENSIVE METABOLIC PANEL
ALT: 34 U/L (ref 0–44)
AST: 20 U/L (ref 15–41)
Albumin: 2.9 g/dL — ABNORMAL LOW (ref 3.5–5.0)
Alkaline Phosphatase: 68 U/L (ref 38–126)
Anion gap: 14 (ref 5–15)
BUN: 29 mg/dL — ABNORMAL HIGH (ref 8–23)
CO2: 20 mmol/L — ABNORMAL LOW (ref 22–32)
Calcium: 8.9 mg/dL (ref 8.9–10.3)
Chloride: 116 mmol/L — ABNORMAL HIGH (ref 98–111)
Creatinine, Ser: 0.98 mg/dL (ref 0.44–1.00)
GFR, Estimated: >60 mL/min (ref >60)
Glucose, Bld: 255 mg/dL — ABNORMAL HIGH (ref 70–99)
Potassium: 3.7 mmol/L (ref 3.5–5.1)
Sodium: 150 mmol/L — ABNORMAL HIGH (ref 135–145)
Total Bilirubin: 0.7 mg/dL (ref 0.3–1.2)
Total Protein: 6.3 g/dL — ABNORMAL LOW (ref 6.5–8.1)

## 2023-06-15 LAB — TROPONIN I (HIGH SENSITIVITY): Troponin I (High Sensitivity): 27 ng/L — ABNORMAL HIGH (ref <18)

## 2023-06-15 LAB — CBC WITH DIFFERENTIAL/PLATELET
Abs Immature Granulocytes: 0.09 10*3/uL — ABNORMAL HIGH (ref 0.00–0.07)
Basophils Absolute: 0.1 10*3/uL (ref 0.0–0.1)
Basophils Relative: 1 %
Eosinophils Absolute: 0.1 10*3/uL (ref 0.0–0.5)
Eosinophils Relative: 1 %
HCT: 38.7 % (ref 36.0–46.0)
Hemoglobin: 11.9 g/dL — ABNORMAL LOW (ref 12.0–15.0)
Immature Granulocytes: 1 %
Lymphocytes Relative: 27 %
Lymphs Abs: 3.9 10*3/uL (ref 0.7–4.0)
MCH: 29.9 pg (ref 26.0–34.0)
MCHC: 30.7 g/dL (ref 30.0–36.0)
MCV: 97.2 fL (ref 80.0–100.0)
Monocytes Absolute: 1 10*3/uL (ref 0.1–1.0)
Monocytes Relative: 7 %
Neutro Abs: 9.5 10*3/uL — ABNORMAL HIGH (ref 1.7–7.7)
Neutrophils Relative %: 63 %
Platelets: 180 10*3/uL (ref 150–400)
RBC: 3.98 MIL/uL (ref 3.87–5.11)
RDW: 14.6 % (ref 11.5–15.5)
WBC: 14.6 10*3/uL — ABNORMAL HIGH (ref 4.0–10.5)
nRBC: 0.1 % (ref 0.0–0.2)

## 2023-06-15 LAB — LACTIC ACID, PLASMA: Lactic Acid, Venous: 1.3 mmol/L (ref 0.5–1.9)

## 2023-06-15 LAB — PROTIME-INR
INR: 1 (ref 0.8–1.2)
Prothrombin Time: 13.7 s (ref 11.4–15.2)

## 2023-06-15 MED ORDER — LINEZOLID 600 MG/300ML IV SOLN
600.0000 mg | Freq: Once | INTRAVENOUS | Status: AC
  Administered 2023-06-15: 600 mg via INTRAVENOUS
  Filled 2023-06-15 (×2): qty 300

## 2023-06-15 MED ORDER — SODIUM CHLORIDE 0.9 % IV BOLUS
1000.0000 mL | Freq: Once | INTRAVENOUS | Status: AC
  Administered 2023-06-15: 1000 mL via INTRAVENOUS

## 2023-06-15 MED ORDER — ACETAMINOPHEN 650 MG RE SUPP
650.0000 mg | Freq: Once | RECTAL | Status: AC
  Administered 2023-06-15: 650 mg via RECTAL
  Filled 2023-06-15: qty 1

## 2023-06-15 MED ORDER — ACETAMINOPHEN 325 MG PO TABS: 650.0000 mg | ORAL_TABLET | Freq: Once | ORAL | Status: DC

## 2023-06-15 NOTE — ED Notes (Signed)
Patient transported to CT 

## 2023-06-15 NOTE — ED Triage Notes (Signed)
Pt bib EMS from [facility] after family requested that pt be evaluated at our facility for increased AMS. Per RN @ [facility], pt recently discharged from our facility (approximately 3 days ago) with dx of UTI and Encephalopathy. Pt was sent to NH on ABX (Linezolld 600mg  Q 12 hrs for 6 days). RN from [facility] states that pt returned to facility more "altered" than usual and has continued to decline over the last few days. States pt normally able to push herself around in wheelchair and is alert and mostly oriented (recent diagnosis of dementia). EMS reports that pt's O2 sats on R/A upon their arrival were 89-91% so they placed pt on O2 @ 2L [place] (pt not normally on O2), CBG was 273, and temp was 101.4 axillary. EMS reported that they gave pt NS and 1 gm Rocephin IV en route. Pt currently on responds to painful stimuli.

## 2023-06-16 ENCOUNTER — Inpatient Hospital Stay (HOSPITAL_COMMUNITY): Payer: Medicare Other

## 2023-06-16 DIAGNOSIS — E785 Hyperlipidemia, unspecified: Secondary | ICD-10-CM

## 2023-06-16 DIAGNOSIS — A419 Sepsis, unspecified organism: Secondary | ICD-10-CM | POA: Diagnosis not present

## 2023-06-16 DIAGNOSIS — G9341 Metabolic encephalopathy: Secondary | ICD-10-CM | POA: Diagnosis not present

## 2023-06-16 DIAGNOSIS — E860 Dehydration: Secondary | ICD-10-CM | POA: Diagnosis not present

## 2023-06-16 DIAGNOSIS — E039 Hypothyroidism, unspecified: Secondary | ICD-10-CM | POA: Insufficient documentation

## 2023-06-16 DIAGNOSIS — F418 Other specified anxiety disorders: Secondary | ICD-10-CM

## 2023-06-16 DIAGNOSIS — R6520 Severe sepsis without septic shock: Secondary | ICD-10-CM

## 2023-06-16 LAB — CBC WITH DIFFERENTIAL/PLATELET
Abs Immature Granulocytes: 0.05 10*3/uL (ref 0.00–0.07)
Basophils Absolute: 0.1 10*3/uL (ref 0.0–0.1)
Basophils Relative: 1 %
Eosinophils Absolute: 0.1 10*3/uL (ref 0.0–0.5)
Eosinophils Relative: 1 %
HCT: 37.9 % (ref 36.0–46.0)
Hemoglobin: 11.7 g/dL — ABNORMAL LOW (ref 12.0–15.0)
Immature Granulocytes: 0 %
Lymphocytes Relative: 22 %
Lymphs Abs: 2.7 10*3/uL (ref 0.7–4.0)
MCH: 29.9 pg (ref 26.0–34.0)
MCHC: 30.9 g/dL (ref 30.0–36.0)
MCV: 96.9 fL (ref 80.0–100.0)
Monocytes Absolute: 0.7 10*3/uL (ref 0.1–1.0)
Monocytes Relative: 6 %
Neutro Abs: 8.8 10*3/uL — ABNORMAL HIGH (ref 1.7–7.7)
Neutrophils Relative %: 70 %
Platelets: 187 10*3/uL (ref 150–400)
RBC: 3.91 MIL/uL (ref 3.87–5.11)
RDW: 14.5 % (ref 11.5–15.5)
WBC: 12.3 10*3/uL — ABNORMAL HIGH (ref 4.0–10.5)
nRBC: 0 % (ref 0.0–0.2)

## 2023-06-16 LAB — BASIC METABOLIC PANEL
Anion gap: 12 (ref 5–15)
BUN: 25 mg/dL — ABNORMAL HIGH (ref 8–23)
CO2: 19 mmol/L — ABNORMAL LOW (ref 22–32)
Calcium: 8.2 mg/dL — ABNORMAL LOW (ref 8.9–10.3)
Chloride: 114 mmol/L — ABNORMAL HIGH (ref 98–111)
Creatinine, Ser: 0.95 mg/dL (ref 0.44–1.00)
GFR, Estimated: >60 mL/min (ref >60)
Glucose, Bld: 292 mg/dL — ABNORMAL HIGH (ref 70–99)
Potassium: 4.2 mmol/L (ref 3.5–5.1)
Sodium: 145 mmol/L (ref 135–145)

## 2023-06-16 LAB — CBG MONITORING, ED
Glucose-Capillary: 311 mg/dL — ABNORMAL HIGH (ref 70–99)
Glucose-Capillary: 268 mg/dL — ABNORMAL HIGH (ref 70–99)
Glucose-Capillary: 201 mg/dL — ABNORMAL HIGH (ref 70–99)

## 2023-06-16 LAB — LACTIC ACID, PLASMA: Lactic Acid, Venous: 1.1 mmol/L (ref 0.5–1.9)

## 2023-06-16 LAB — COMPREHENSIVE METABOLIC PANEL
ALT: 33 U/L (ref 0–44)
AST: 23 U/L (ref 15–41)
Albumin: 2.6 g/dL — ABNORMAL LOW (ref 3.5–5.0)
Alkaline Phosphatase: 64 U/L (ref 38–126)
Anion gap: 10 (ref 5–15)
BUN: 29 mg/dL — ABNORMAL HIGH (ref 8–23)
CO2: 22 mmol/L (ref 22–32)
Calcium: 8.6 mg/dL — ABNORMAL LOW (ref 8.9–10.3)
Chloride: 117 mmol/L — ABNORMAL HIGH (ref 98–111)
Creatinine, Ser: 0.98 mg/dL (ref 0.44–1.00)
GFR, Estimated: >60 mL/min (ref >60)
Glucose, Bld: 342 mg/dL — ABNORMAL HIGH (ref 70–99)
Potassium: 4.1 mmol/L (ref 3.5–5.1)
Sodium: 149 mmol/L — ABNORMAL HIGH (ref 135–145)
Total Bilirubin: 0.6 mg/dL (ref 0.3–1.2)
Total Protein: 6 g/dL — ABNORMAL LOW (ref 6.5–8.1)

## 2023-06-16 LAB — TROPONIN I (HIGH SENSITIVITY): Troponin I (High Sensitivity): 25 ng/L — ABNORMAL HIGH (ref <18)

## 2023-06-16 LAB — MAGNESIUM: Magnesium: 2.1 mg/dL (ref 1.7–2.4)

## 2023-06-16 MED ORDER — ATORVASTATIN CALCIUM 10 MG PO TABS
10.0000 mg | ORAL_TABLET | Freq: Every day | ORAL | Status: DC
  Administered 2023-06-16 – 2023-06-21 (×5): 10 mg via ORAL
  Filled 2023-06-16 (×6): qty 1

## 2023-06-16 MED ORDER — FAMOTIDINE 20 MG PO TABS
20.0000 mg | ORAL_TABLET | Freq: Every day | ORAL | Status: DC
  Administered 2023-06-17 – 2023-06-19 (×3): 20 mg via ORAL
  Filled 2023-06-16 (×3): qty 1

## 2023-06-16 MED ORDER — ACETAMINOPHEN 325 MG PO TABS
650.0000 mg | ORAL_TABLET | Freq: Four times a day (QID) | ORAL | Status: DC | PRN
  Administered 2023-06-17: 650 mg via ORAL
  Filled 2023-06-16: qty 2

## 2023-06-16 MED ORDER — ESCITALOPRAM OXALATE 10 MG PO TABS
20.0000 mg | ORAL_TABLET | Freq: Every day | ORAL | Status: DC
Start: 2023-06-16 — End: 2023-06-16
  Administered 2023-06-16: 20 mg via ORAL
  Filled 2023-06-16: qty 2

## 2023-06-16 MED ORDER — GABAPENTIN 300 MG PO CAPS
300.0000 mg | ORAL_CAPSULE | Freq: Every day | ORAL | Status: DC
  Administered 2023-06-17 – 2023-06-20 (×4): 300 mg via ORAL
  Filled 2023-06-16 (×4): qty 1

## 2023-06-16 MED ORDER — LEVOTHYROXINE SODIUM 50 MCG PO TABS
50.0000 ug | ORAL_TABLET | Freq: Every day | ORAL | Status: DC
  Administered 2023-06-18 – 2023-06-21 (×4): 50 ug via ORAL
  Filled 2023-06-16 (×5): qty 1

## 2023-06-16 MED ORDER — ONDANSETRON HCL 4 MG PO TABS: 4.0000 mg | ORAL_TABLET | Freq: Four times a day (QID) | ORAL | Status: DC | PRN

## 2023-06-16 MED ORDER — INSULIN ASPART 100 UNIT/ML IJ SOLN: 0.0000 [IU] | Freq: Every day | INTRAMUSCULAR | Status: DC

## 2023-06-16 MED ORDER — SODIUM CHLORIDE 0.45 % IV SOLN: INTRAVENOUS | Status: AC

## 2023-06-16 MED ORDER — SODIUM CHLORIDE 0.9 % IV SOLN
1.0000 g | INTRAVENOUS | Status: DC
  Administered 2023-06-16 – 2023-06-18 (×3): 1 g via INTRAVENOUS
  Filled 2023-06-16 (×3): qty 10

## 2023-06-16 MED ORDER — CLOPIDOGREL BISULFATE 75 MG PO TABS
75.0000 mg | ORAL_TABLET | Freq: Every day | ORAL | Status: DC
  Administered 2023-06-16 – 2023-06-21 (×5): 75 mg via ORAL
  Filled 2023-06-16 (×6): qty 1

## 2023-06-16 MED ORDER — MELATONIN 3 MG PO TABS
6.0000 mg | ORAL_TABLET | Freq: Every day | ORAL | Status: DC | PRN
  Administered 2023-06-17 – 2023-06-19 (×2): 6 mg via ORAL
  Filled 2023-06-16 (×2): qty 2

## 2023-06-16 MED ORDER — LINEZOLID 600 MG/300ML IV SOLN
600.0000 mg | Freq: Two times a day (BID) | INTRAVENOUS | Status: DC
  Administered 2023-06-16 – 2023-06-17 (×4): 600 mg via INTRAVENOUS
  Filled 2023-06-16 (×7): qty 300

## 2023-06-16 MED ORDER — OXYCODONE HCL 5 MG PO TABS
5.0000 mg | ORAL_TABLET | ORAL | Status: DC | PRN
  Administered 2023-06-19 – 2023-06-20 (×2): 5 mg via ORAL
  Filled 2023-06-16 (×2): qty 1

## 2023-06-16 MED ORDER — ACETAMINOPHEN 650 MG RE SUPP
650.0000 mg | Freq: Four times a day (QID) | RECTAL | Status: DC | PRN
  Administered 2023-06-16: 650 mg via RECTAL
  Filled 2023-06-16: qty 1

## 2023-06-16 MED ORDER — SODIUM CHLORIDE 0.45 % IV SOLN: INTRAVENOUS | Status: DC

## 2023-06-16 MED ORDER — INSULIN DETEMIR 100 UNIT/ML ~~LOC~~ SOLN
8.0000 [IU] | Freq: Every day | SUBCUTANEOUS | Status: DC
  Filled 2023-06-16: qty 0.08

## 2023-06-16 MED ORDER — METOPROLOL TARTRATE 25 MG PO TABS
12.5000 mg | ORAL_TABLET | Freq: Every morning | ORAL | Status: DC
  Administered 2023-06-16 – 2023-06-19 (×3): 12.5 mg via ORAL
  Filled 2023-06-16 (×5): qty 1

## 2023-06-16 MED ORDER — INSULIN ASPART 100 UNIT/ML IJ SOLN
0.0000 [IU] | Freq: Three times a day (TID) | INTRAMUSCULAR | Status: DC
  Administered 2023-06-16: 5 [IU] via SUBCUTANEOUS
  Administered 2023-06-16: 11 [IU] via SUBCUTANEOUS
  Administered 2023-06-16: 8 [IU] via SUBCUTANEOUS
  Administered 2023-06-17: 5 [IU] via SUBCUTANEOUS
  Administered 2023-06-17 – 2023-06-18 (×2): 3 [IU] via SUBCUTANEOUS
  Administered 2023-06-18 – 2023-06-21 (×6): 2 [IU] via SUBCUTANEOUS
  Filled 2023-06-16 (×2): qty 1

## 2023-06-16 MED ORDER — ACETAMINOPHEN 325 MG PO TABS: 650.0000 mg | ORAL_TABLET | Freq: Four times a day (QID) | ORAL | Status: DC | PRN

## 2023-06-16 MED ORDER — LORAZEPAM 0.5 MG PO TABS
0.5000 mg | ORAL_TABLET | Freq: Two times a day (BID) | ORAL | Status: DC
  Administered 2023-06-16 – 2023-06-20 (×6): 0.5 mg via ORAL
  Filled 2023-06-16 (×7): qty 1

## 2023-06-16 MED ORDER — INSULIN DETEMIR 100 UNIT/ML ~~LOC~~ SOLN
8.0000 [IU] | Freq: Every day | SUBCUTANEOUS | Status: DC
  Administered 2023-06-16 – 2023-06-21 (×6): 8 [IU] via SUBCUTANEOUS
  Filled 2023-06-16 (×8): qty 0.08

## 2023-06-16 MED ORDER — HEPARIN SODIUM (PORCINE) 5000 UNIT/ML IJ SOLN
5000.0000 [IU] | Freq: Three times a day (TID) | INTRAMUSCULAR | Status: DC
  Administered 2023-06-16 – 2023-06-21 (×17): 5000 [IU] via SUBCUTANEOUS
  Filled 2023-06-16 (×17): qty 1

## 2023-06-16 MED ORDER — ONDANSETRON HCL 4 MG/2ML IJ SOLN
4.0000 mg | Freq: Four times a day (QID) | INTRAMUSCULAR | Status: DC | PRN
  Administered 2023-06-20: 4 mg via INTRAVENOUS
  Filled 2023-06-16: qty 2

## 2023-06-16 NOTE — ED Notes (Signed)
Tried to get patient to drink some water from a straw and she was able to swallow a few gulps. She did cough for a little on one drink, but otherwise was able to drink some.

## 2023-06-16 NOTE — H&P (Signed)
History and Physical    Patient: Dawn Ho AVW:[id number] DOB: 03-Feb-1951 DOA: 06/15/2023 DOS: the patient was seen and examined on 06/16/2023 PCP: Bernerd Limbo, MD  Patient coming from: SNF  Chief Complaint:  Chief Complaint  Patient presents with   Altered Mental Status   HPI: Dawn Ho is a 72 y.o. female with medical history significant of CVA, depression with anxiety, diabetes mellitus type 2, dyslipidemia, fibromyalgia, hypertension, stroke, and more presents the ED with a chief complaint of altered mental status.  Patient has a history of dementia, but her altered mental status has been described as decreased p.o. intake.  She was given Rocephin with EMS.  She was reported to be somnolent more than baseline.  She has not been agitated in the ED.  She is not able to provide me any history as she is hypersomnolent.  She opens her eyes looks you in the eye moans, and then goes back to sleep.  When I asked her if she was in any pain she did not answer at all.  She did answer to a couple questions just one-word yes even if they were open ended questions.  But she did not give that answer to pain which is a good sign.  Her nursing home papers were reviewed at bedside and there is no information under the advanced directive section. Review of Systems: unable to review all systems due to the inability of the patient to answer questions. Past Medical History:  Diagnosis Date   Cataract, left eye    CVA (cerebral infarction)    Depression with anxiety    Diabetes mellitus    Dyslipidemia    Fibromyalgia    Hypertension    Retinopathy    Stroke (HCC)    L. hemiparesis   TIA (transient ischemic attack)    Past Surgical History:  Procedure Laterality Date   ABDOMINAL HYSTERECTOMY     age 39, "removed one ovary- excessive bleeding"   CESAREAN SECTION     CHOLECYSTECTOMY     LOOP RECORDER IMPLANT     Dr. Johney Frame placed 10/15/2014   ORIF HIP FRACTURE Right 09/10/2020    Procedure: im nail;  Surgeon: Eldred Manges, MD;  Location: [facility];  Service: Orthopedics;  Laterality: Right;   TEE WITHOUT CARDIOVERSION N/A 10/15/2014   Procedure: TRANSESOPHAGEAL ECHOCARDIOGRAM (TEE);  Surgeon: Pricilla Riffle, MD;  Location: [facility] ENDOSCOPY;  Service: Cardiovascular;  Laterality: N/A;   Social History:  reports that she has never smoked. She has never used smokeless tobacco. She reports that she does not drink alcohol and does not use drugs.  Allergies  Allergen Reactions   Penicillins Rash and Swelling   Iodinated Contrast Media     Per pt: swelling with first contrast xray in 1969. Second contrast xray in the 90s without symptoms:  Patient has been pre-medicated for CT scans in 2016. Needs to have pre-medications before any scans with IV contrast. / TSF 08/30/16 Per pt: swelling with first contrast xray in 1969. Second contrast xray in the 90s without symptoms:  Patient has been pre-medicated for CT scans in 2016. Needs to have pre-medications before any scans with IV contrast. / TSF 08/30/16    Family History  Problem Relation Age of Onset   Heart disease Father    Hypertension Sister    Colon cancer Neg Hx    Colon polyps Neg Hx     Prior to Admission medications   Medication Sig Start Date End  Date Taking? Authorizing Provider  acetaminophen (TYLENOL) 325 MG tablet Take 650 mg by mouth every 6 (six) hours as needed.    [provider]  acetaminophen (TYLENOL) 650 MG suppository Place 650 mg rectally every 12 (twelve) hours as needed for fever or mild pain (pain score 1-3). For 3 days    [provider]  atorvastatin (LIPITOR) 10 MG tablet Take 10 mg by mouth daily. 04/12/23   [provider]  clopidogrel (PLAVIX) 75 MG tablet Take 1 tablet (75 mg total) by mouth daily. 10/18/14   Meredeth Ide, MD  escitalopram (LEXAPRO) 20 MG tablet Take 1 tablet (20 mg total) by mouth daily. Restart on 06/14/23 06/14/23   Catarina Hartshorn, MD  famotidine (PEPCID)  20 MG tablet  12/05/19   [provider]  gabapentin (NEURONTIN) 300 MG capsule Take 1 capsule (300 mg total) by mouth at bedtime. 10/18/14   Meredeth Ide, MD  gabapentin (NEURONTIN) 400 MG capsule Take 400 mg by mouth. 03/12/22   [provider]  Heparin Sod, Pork, Lock Flush (HEPARIN FLUSH) 10 UNIT/ML SOLN injection Inject 3 mLs into the vein every 12 (twelve) hours.    [provider]  insulin glargine (LANTUS) 100 UNIT/ML injection Inject 0.1 mLs (10 Units total) into the skin at bedtime. Patient taking differently: Inject 15 Units into the skin at bedtime. 09/12/20   Lorin Glass, MD  insulin lispro (HUMALOG) 100 UNIT/ML KwikPen Inject 0-12 Units into the skin 3 (three) times daily. Inject as per sliding scale: If 100-150=0; 151-200=2; 201-250=4; 251-300=6; 301-350=8; 351-400=10; For CBG greater than 400 give 12 units and call provider. 06/04/23   [provider]  levothyroxine (SYNTHROID) 50 MCG tablet Take 50 mcg by mouth daily. 04/12/23   [provider]  LORazepam (ATIVAN) 0.5 MG tablet Take one tablet by mouth twice daily for anxiety Patient taking differently: Take 0.5 mg by mouth 2 (two) times daily. 11/14/17   Dhungel, Nishant, MD  melatonin 3 MG TABS tablet Take 6 mg by mouth daily as needed (sleep).    [provider]  metoprolol tartrate (LOPRESSOR) 25 MG tablet Take 0.5 tablets (12.5 mg total) by mouth every morning. 06/09/23   Catarina Hartshorn, MD  metoprolol tartrate (LOPRESSOR) 50 MG tablet Take 50 mg by mouth in the morning. Patient takes 50mg  in the morning and 25mg  in the evening    [provider]  Multiple Vitamins-Minerals (MULTIVITAMIN WITH MINERALS) tablet Take 1 tablet by mouth daily.    [provider]  nitroGLYCERIN (NITROSTAT) 0.4 MG SL tablet Place 0.4 mg under the tongue every 5 (five) minutes as needed for chest pain.    [provider]  nystatin (MYCOSTATIN/NYSTOP) powder Apply topically 2  (two) times daily. 06/07/23   Kathlen Mody, MD  sennosides-docusate sodium (SENOKOT-S) 8.6-50 MG tablet Take 1 tablet by mouth daily.    [provider]    Physical Exam: Vitals:   06/16/23 0300 06/16/23 0400 06/16/23 0430 06/16/23 0500  BP: 124/79 130/68 124/69 124/73  Pulse:  77 79 76  Resp: 16 17 20 17   Temp:      TempSrc:      SpO2:  95% 94% 95%  Weight:      Height:       1.  General: Patient lying supine in bed,  no acute distress   2. Psychiatric: Somnolent, nonverbal, not following commands   3. Neurologic: Somnolent, wakes up and makes eye contact but does not speak, not  following commands   4. HEENMT:  Head is atraumatic, normocephalic, pupils reactive to light, neck is supple, trachea is midline, mucous membranes are moist   5. Respiratory : Lungs are clear to auscultation bilaterally without wheezing, rhonchi, rales, no cyanosis, no increase in work of breathing or accessory muscle use   6. Cardiovascular : Heart rate normal, rhythm is regular, no murmurs, rubs or gallops, no peripheral edema, peripheral pulses palpated   7. Gastrointestinal:  Abdomen is soft, nondistended, nontender to palpation bowel sounds active, no masses or organomegaly palpated   8. Skin:  Skin is warm, dry and intact without rashes, acute lesions, or ulcers on limited exam   9.Musculoskeletal:  No acute deformities or trauma, no asymmetry in tone, no peripheral edema, peripheral pulses palpated, no tenderness to palpation in the extremities  Data Reviewed: In the ED Patient is febrile, tachycardic, tachypneic, with a leukocytosis of 14.6 Chemistry is mostly unremarkable Albumin 2.9 Negative COVID UA indicative of UTI Blood culture pending CT head shows no acute intracranial abnormality Chest x-ray shows cardiomegaly Lactic acid 1.3 Patient was started on linezolid She was given 1 L of fluid Tylenol suppository was given for fever Admission requested for  sepsis Assessment and Plan: * Sepsis (HCC) - Patient has a temp of 102.1, heart rate 108, respiratory rate 23, leukocytosis 14.6, with acute metabolic encephalopathy - Negative COVID - Blood culture pending - Continue gentle hydration - UA indicative of UTI - See respective assessment and plan for UTI - No lactic acidosis - Continue to monitor  Hypothyroidism - Last TSH was normal - Continue Synthroid  Dehydration - Appears volume down - Hypernatremia 150 - Continue gentle hydration with half-normal saline - Monitor in the a.m. labs  Uncontrolled type 2 diabetes mellitus with hyperglycemia, with long-term current use of insulin (HCC) - Continue reduced dose of basal insulin - Sliding scale coverage - Monitor CBG  Acute metabolic encephalopathy - CT head shows no acute changes - Likely related to sepsis see respective assessment and plan  UTI (urinary tract infection) - UA indicative of UTI - Last culture showed Enterococcus faecalis pansensitive - Patient was discharged on linezolid - Linezolid was again started in the ED - After speaking with pharmacy they prefer to continue linezolid - Blood culture pending - Urine culture pending - Leukocytosis at 14.6 - Continue to monitor  HLD (hyperlipidemia) - Continue statin  Depression with anxiety - Continue Ativan and Lexapro      Advance Care Planning:   Code Status: Full Code  Consults: None at this time  Family Communication: No family at bedside  Severity of Illness: The appropriate patient status for this patient is INPATIENT. Inpatient status is judged to be reasonable and necessary in order to provide the required intensity of service to ensure the patient's safety. The patient's presenting symptoms, physical exam findings, and initial radiographic and laboratory data in the context of their chronic comorbidities is felt to place them at high risk for further clinical deterioration. Furthermore, it is not  anticipated that the patient will be medically stable for discharge from the hospital within 2 midnights of admission.   * I certify that at the point of admission it is my clinical judgment that the patient will require inpatient hospital care spanning beyond 2 midnights from the point of admission due to high intensity of service, high risk for further deterioration and high frequency of surveillance required.*  Author: Lilyan Gilford, DO 06/16/2023 6:05 AM  For on call  review [email or web address].

## 2023-06-16 NOTE — ED Provider Notes (Signed)
[place] EMERGENCY DEPARTMENT AT [facility] Provider Note   CSN: [id number] Arrival date & time: 06/15/23  2100     History  Chief Complaint  Patient presents with   Altered Mental Status    KATHRENE SIKKENGA is a 72 y.o. female.  72 year old female present emergency department for altered mental status.  Has a history of dementia, typically confused but is alert and cannot wheel herself around in wheelchair in facility.  Will recommend his family and somewhat have a conversation.  Patient quite somnolent unable to provide much history.  Family present at bedside and states that patient has not had anything to eat or drink for the past several days and has just been very sleepy.  This was similar to prior admission for urinary tract infection.   Altered Mental Status      Home Medications Prior to Admission medications   Medication Sig Start Date End Date Taking? Authorizing Provider  acetaminophen (TYLENOL) 325 MG tablet Take 650 mg by mouth every 6 (six) hours as needed.    [provider]  acetaminophen (TYLENOL) 650 MG suppository Place 650 mg rectally every 12 (twelve) hours as needed for fever or mild pain (pain score 1-3). For 3 days    [provider]  atorvastatin (LIPITOR) 10 MG tablet Take 10 mg by mouth daily. 04/12/23   [provider]  clopidogrel (PLAVIX) 75 MG tablet Take 1 tablet (75 mg total) by mouth daily. 10/18/14   Meredeth Ide, MD  escitalopram (LEXAPRO) 20 MG tablet Take 1 tablet (20 mg total) by mouth daily. Restart on 06/14/23 06/14/23   Catarina Hartshorn, MD  famotidine (PEPCID) 20 MG tablet  12/05/19   [provider]  gabapentin (NEURONTIN) 300 MG capsule Take 1 capsule (300 mg total) by mouth at bedtime. 10/18/14   Meredeth Ide, MD  gabapentin (NEURONTIN) 400 MG capsule Take 400 mg by mouth. 03/12/22   [provider]  Heparin Sod, Pork, Lock Flush (HEPARIN FLUSH) 10 UNIT/ML SOLN injection Inject 3 mLs into  the vein every 12 (twelve) hours.    [provider]  insulin glargine (LANTUS) 100 UNIT/ML injection Inject 0.1 mLs (10 Units total) into the skin at bedtime. Patient taking differently: Inject 15 Units into the skin at bedtime. 09/12/20   Lorin Glass, MD  insulin lispro (HUMALOG) 100 UNIT/ML KwikPen Inject 0-12 Units into the skin 3 (three) times daily. Inject as per sliding scale: If 100-150=0; 151-200=2; 201-250=4; 251-300=6; 301-350=8; 351-400=10; For CBG greater than 400 give 12 units and call provider. 06/04/23   [provider]  levothyroxine (SYNTHROID) 50 MCG tablet Take 50 mcg by mouth daily. 04/12/23   [provider]  LORazepam (ATIVAN) 0.5 MG tablet Take one tablet by mouth twice daily for anxiety Patient taking differently: Take 0.5 mg by mouth 2 (two) times daily. 11/14/17   Dhungel, Nishant, MD  melatonin 3 MG TABS tablet Take 6 mg by mouth daily as needed (sleep).    [provider]  metoprolol tartrate (LOPRESSOR) 25 MG tablet Take 0.5 tablets (12.5 mg total) by mouth every morning. 06/09/23   Catarina Hartshorn, MD  metoprolol tartrate (LOPRESSOR) 50 MG tablet Take 50 mg by mouth in the morning. Patient takes 50mg  in the morning and 25mg  in the evening    [provider]  Multiple Vitamins-Minerals (MULTIVITAMIN WITH MINERALS) tablet Take 1 tablet by mouth daily.    [provider]  nitroGLYCERIN (NITROSTAT) 0.4 MG SL tablet  Place 0.4 mg under the tongue every 5 (five) minutes as needed for chest pain.    [provider]  nystatin (MYCOSTATIN/NYSTOP) powder Apply topically 2 (two) times daily. 06/07/23   Kathlen Mody, MD  sennosides-docusate sodium (SENOKOT-S) 8.6-50 MG tablet Take 1 tablet by mouth daily.    [provider]      Allergies    Penicillins and Iodinated contrast media    Review of Systems   Review of Systems  Physical Exam Updated Vital Signs BP 132/74 (BP Location: Right Arm)   Pulse 86    Temp 99 F (37.2 C) (Oral)   Resp 19   Ht 5' (1.524 m)   Wt 74 kg   SpO2 94%   BMI 31.86 kg/m  Physical Exam Vitals and nursing note reviewed.  Constitutional:      General: She is not in acute distress.    Appearance: She is not toxic-appearing.  HENT:     Head: Normocephalic.  Eyes:     Conjunctiva/sclera: Conjunctivae normal.  Cardiovascular:     Rate and Rhythm: Normal rate and regular rhythm.  Pulmonary:     Effort: Pulmonary effort is normal.     Breath sounds: Normal breath sounds.  Abdominal:     General: Abdomen is flat. There is no distension.     Tenderness: There is no abdominal tenderness. There is no guarding or rebound.  Musculoskeletal:     Right lower leg: No edema.     Left lower leg: No edema.  Skin:    General: Skin is warm and dry.     Capillary Refill: Capillary refill takes less than 2 seconds.  Neurological:     Mental Status: She is alert.     Comments: Exam limited by patient's mentation.  Will withdrawal extremities to pain.  Psychiatric:        Mood and Affect: Mood normal.        Behavior: Behavior normal.     ED Results / Procedures / Treatments   Labs (all labs ordered are listed, but only abnormal results are displayed) Labs Reviewed  COMPREHENSIVE METABOLIC PANEL - Abnormal; Notable for the following components:      Result Value   Sodium 150 (*)    Chloride 116 (*)    CO2 20 (*)    Glucose, Bld 255 (*)    BUN 29 (*)    Total Protein 6.3 (*)    Albumin 2.9 (*)    All other components within normal limits  CBC WITH DIFFERENTIAL/PLATELET - Abnormal; Notable for the following components:   WBC 14.6 (*)    Hemoglobin 11.9 (*)    Neutro Abs 9.5 (*)    Abs Immature Granulocytes 0.09 (*)    All other components within normal limits  URINALYSIS, W/ REFLEX TO CULTURE (INFECTION SUSPECTED) - Abnormal; Notable for the following components:   APPearance TURBID (*)    Hgb urine dipstick SMALL (*)    Protein, ur 100 (*)     Leukocytes,Ua LARGE (*)    Bacteria, UA MANY (*)    All other components within normal limits  TROPONIN I (HIGH SENSITIVITY) - Abnormal; Notable for the following components:   Troponin I (High Sensitivity) 27 (*)    All other components within normal limits  TROPONIN I (HIGH SENSITIVITY) - Abnormal; Notable for the following components:   Troponin I (High Sensitivity) 25 (*)    All other components within normal limits  CULTURE, BLOOD (ROUTINE X 2)  CULTURE, BLOOD (ROUTINE X 2)  RESP PANEL BY RT-PCR (RSV, FLU A&B, COVID)  [id number]  URINE CULTURE  LACTIC ACID, PLASMA  LACTIC ACID, PLASMA  PROTIME-INR  AMMONIA    EKG None  Radiology CT Head Wo Contrast  Result Date: 06/15/2023 CLINICAL DATA:  Mental status change, unknown cause. History of UTI and encephalopathy. EXAM: CT HEAD WITHOUT CONTRAST TECHNIQUE: Contiguous axial images were obtained from the base of the skull through the vertex without intravenous contrast. RADIATION DOSE REDUCTION: This exam was performed according to the departmental dose-optimization program which includes automated exposure control, adjustment of the mA and/or kV according to patient size and/or use of iterative reconstruction technique. COMPARISON:  06/04/2023. FINDINGS: Brain: No acute intracranial hemorrhage, midline shift or mass effect. No extra-axial fluid collection. Diffuse atrophy is noted. Extensive subcortical and periventricular white matter hypodensities are present bilaterally. An old infarct is noted in the basal ganglia on the left. Vascular: No hyperdense vessel or unexpected calcification. Skull: Normal. Negative for fracture or focal lesion. Sinuses/Orbits: No acute finding. Other: None. IMPRESSION: 1. No acute intracranial process. 2. Stable atrophy with extensive chronic microvascular ischemic changes and old infarct. Electronically Signed   By: Thornell Sartorius M.D.   On: 06/15/2023 22:40   DG Chest Portable 1 View  Result Date:  06/15/2023 CLINICAL DATA:  Sepsis EXAM: PORTABLE CHEST 1 VIEW COMPARISON:  Chest x-ray 06/04/2023 FINDINGS: Loop recorder overlies the chest. The heart is enlarged. The lungs are clear. There is no pleural effusion or pneumothorax. No acute fractures are seen. IMPRESSION: Cardiomegaly. No acute cardiopulmonary process. Electronically Signed   By: Darliss Cheney M.D.   On: 06/15/2023 22:04    Procedures Procedures    Medications Ordered in ED Medications  acetaminophen (TYLENOL) suppository 650 mg (650 mg Rectal Given 06/15/23 2215)  sodium chloride 0.9 % bolus 1,000 mL (1,000 mLs Intravenous New Bag/Given 06/15/23 2310)  linezolid (ZYVOX) IVPB 600 mg (600 mg Intravenous New Bag/Given 06/15/23 2312)    ED Course/ Medical Decision Making/ A&P                                 Medical Decision Making 72 year old female present emergency department for altered mental status.  Febrile with EMS 102.1, mildly elevated heart rate 97.  Hemodynamically stable.  No overt signs of external infection.  Received Rocephin with EMS prior to arrival.  Appears to have UTI on UA, given IV Zyvox as is but she was discharged on.  She has a hypernatremia at 150 which would go along with her decreased p.o. intake.  Clinically does appear dry.  No transaminitis to suggest hepatic encephalopathy.  Ammonia not elevated and unlikely cause of her altered mental status.  Given her fever, concern for possible viral etiology such as flu COVID, however that was negative.  She does have mildly elevated troponin, but EKG otherwise largely reassuring.  CT head without acute intracranial pathology.  Chest x-ray without pneumonia.  Given patient's markers of infection and altered mental status will admit for complicated UTI.  Amount and/or Complexity of Data Reviewed Labs: ordered. Radiology: ordered. ECG/medicine tests: ordered.  Risk OTC drugs. Prescription drug management. Decision regarding  hospitalization.          Final Clinical Impression(s) / ED Diagnoses Final diagnoses:  Hypernatremia  Altered mental status, unspecified altered mental status type  Urinary tract infection without hematuria, site unspecified    Rx / DC Orders  ED Discharge Orders     None         Coral Spikes, [place] 06/16/23 [id number]

## 2023-06-16 NOTE — ED Notes (Signed)
Pt grimaced and seemed to complain of pain in her left lower abdominal area.

## 2023-06-16 NOTE — ED Notes (Addendum)
ED TO INPATIENT HANDOFF REPORT  ED Nurse Name and Phone #: Beverley Fiedler Name/Age/Gender Dawn Ho 72 y.o. female Room/Bed: APA08/APA08  Code Status   Code Status: Full Code  Home/SNF/Other Nursing Home Patient oriented to: self Is this baseline? No   Triage Complete: Triage complete  Chief Complaint Fall [W19.XXXA] Sepsis (HCC) [A41.9]  Triage Note Pt bib EMS from [facility] after family requested that pt be evaluated at our facility for increased AMS. Per RN @ [facility], pt recently discharged from our facility (approximately 3 days ago) with dx of UTI and Encephalopathy. Pt was sent to NH on ABX (Linezolld 600mg  Q 12 hrs for 6 days). RN from [facility] states that pt returned to facility more "altered" than usual and has continued to decline over the last few days. States pt normally able to push herself around in wheelchair and is alert and mostly oriented (recent diagnosis of dementia). EMS reports that pt's O2 sats on R/A upon their arrival were 89-91% so they placed pt on O2 @ 2L [place] (pt not normally on O2), CBG was 273, and temp was 101.4 axillary. EMS reported that they gave pt NS and 1 gm Rocephin IV en route. Pt currently on responds to painful stimuli.    Allergies Allergies  Allergen Reactions   Penicillins Rash and Swelling   Iodinated Contrast Media Swelling    Per pt: swelling with first contrast xray in 1969. Second contrast xray in the 90s without symptoms:  Patient has been pre-medicated for CT scans in 2016. Needs to have pre-medications before any scans with IV contrast. / TSF 08/30/16 Per pt: swelling with first contrast xray in 1969. Second contrast xray in the 90s without symptoms:  Patient has been pre-medicated for CT scans in 2016. Needs to have pre-medications before any scans with IV contrast. / TSF 08/30/16    Level of Care/Admitting Diagnosis ED Disposition     ED Disposition  Admit   Condition  --   Comment  Hospital Area:  [facility] [[id number]]  Level of Care: Telemetry [5]  Covid Evaluation: Asymptomatic - no recent exposure (last 10 days) testing not required  Diagnosis: Sepsis [facility]) [[id number]]  Admitting Physician: Lilyan Gilford [[id number]]  Attending Physician: Lilyan Gilford [[id number]]  Certification:: I certify this patient will need inpatient services for at least 2 midnights  Expected Medical Readiness: 06/17/2023          B Medical/Surgery History Past Medical History:  Diagnosis Date   Cataract, left eye    CVA (cerebral infarction)    Depression with anxiety    Diabetes mellitus    Dyslipidemia    Fibromyalgia    Hypertension    Retinopathy    Stroke (HCC)    L. hemiparesis   TIA (transient ischemic attack)    Past Surgical History:  Procedure Laterality Date   ABDOMINAL HYSTERECTOMY     age 51, "removed one ovary- excessive bleeding"   CESAREAN SECTION     CHOLECYSTECTOMY     LOOP RECORDER IMPLANT     Dr. Johney Frame placed 10/15/2014   ORIF HIP FRACTURE Right 09/10/2020   Procedure: im nail;  Surgeon: Eldred Manges, MD;  Location: [facility] ORS;  Service: Orthopedics;  Laterality: Right;   TEE WITHOUT CARDIOVERSION N/A 10/15/2014   Procedure: TRANSESOPHAGEAL ECHOCARDIOGRAM (TEE);  Surgeon: Pricilla Riffle, MD;  Location: [facility] ENDOSCOPY;  Service: Cardiovascular;  Laterality: N/A;     A IV Location/Drains/Wounds Patient  Lines/Drains/Airways Status     Active Line/Drains/Airways     Name Placement date Placement time Site Days   Peripheral IV 06/15/23 20 G Posterior;Right Forearm 06/15/23  2129  Forearm  1            Intake/Output Last 24 hours  Intake/Output Summary (Last 24 hours) at 06/16/2023 1632 Last data filed at 06/16/2023 1301 Gross per 24 hour  Intake 547 ml  Output --  Net 547 ml    Labs/Imaging Results for orders placed or performed during the hospital encounter of 06/15/23 (from the past 48 hour(s))  Urinalysis, w/ Reflex to Culture (Infection  Suspected) -Urine, Catheterized     Status: Abnormal   Collection Time: 06/15/23  9:23 PM  Result Value Ref Range   Specimen Source URINE, CATHETERIZED    Color, Urine YELLOW YELLOW   APPearance TURBID (A) CLEAR   Specific Gravity, Urine 1.015 1.005 - 1.030   pH 6.0 5.0 - 8.0   Glucose, UA NEGATIVE NEGATIVE mg/dL   Hgb urine dipstick SMALL (A) NEGATIVE   Bilirubin Urine NEGATIVE NEGATIVE   Ketones, ur NEGATIVE NEGATIVE mg/dL   Protein, ur 161 (A) NEGATIVE mg/dL   Nitrite NEGATIVE NEGATIVE   Leukocytes,Ua LARGE (A) NEGATIVE   RBC / HPF >50 0 - 5 RBC/hpf   WBC, UA >50 0 - 5 WBC/hpf    Comment:        Reflex urine culture not performed if WBC <=10, OR if Squamous epithelial cells >5. If Squamous epithelial cells >5 suggest recollection.    Bacteria, UA MANY (A) NONE SEEN   Squamous Epithelial / HPF 0-5 0 - 5 /HPF   WBC Clumps PRESENT    Mucus PRESENT     Comment: Performed at [facility], [address] [place], [place] [address]  Comprehensive metabolic panel     Status: Abnormal   Collection Time: 06/15/23  9:44 PM  Result Value Ref Range   Sodium 150 (H) 135 - 145 mmol/L   Potassium 3.7 3.5 - 5.1 mmol/L   Chloride 116 (H) 98 - 111 mmol/L   CO2 20 (L) 22 - 32 mmol/L   Glucose, Bld 255 (H) 70 - 99 mg/dL    Comment: Glucose reference range applies only to samples taken after fasting for at least 8 hours.   BUN 29 (H) 8 - 23 mg/dL   Creatinine, Ser 5.40 0.44 - 1.00 mg/dL   Calcium 8.9 8.9 - 98.1 mg/dL   Total Protein 6.3 (L) 6.5 - 8.1 g/dL   Albumin 2.9 (L) 3.5 - 5.0 g/dL   AST 20 15 - 41 U/L   ALT 34 0 - 44 U/L   Alkaline Phosphatase 68 38 - 126 U/L   Total Bilirubin 0.7 0.3 - 1.2 mg/dL   GFR, Estimated >19 >14 mL/min    Comment: (NOTE) Calculated using the CKD-EPI Creatinine Equation (2021)    Anion gap 14 5 - 15    Comment: Performed at [facility], [address], [place], [place] [address]  Lactic acid, plasma     Status: None   Collection Time: 06/15/23  9:44  PM  Result Value Ref Range   Lactic Acid, Venous 1.3 0.5 - 1.9 mmol/L    Comment: Performed at [facility], [address], [place], [place] [address]  CBC with Differential     Status: Abnormal   Collection Time: 06/15/23  9:44 PM  Result Value Ref Range   WBC 14.6 (H) 4.0 - 10.5 K/uL  RBC 3.98 3.87 - 5.11 MIL/uL   Hemoglobin 11.9 (L) 12.0 - 15.0 g/dL   HCT 78.2 95.6 - 21.3 %   MCV 97.2 80.0 - 100.0 fL   MCH 29.9 26.0 - 34.0 pg   MCHC 30.7 30.0 - 36.0 g/dL   RDW 08.6 57.8 - 46.9 %   Platelets 180 150 - 400 K/uL    Comment: SPECIMEN CHECKED FOR CLOTS PLATELET COUNT CONFIRMED BY SMEAR    nRBC 0.1 0.0 - 0.2 %   Neutrophils Relative % 63 %   Neutro Abs 9.5 (H) 1.7 - 7.7 K/uL   Lymphocytes Relative 27 %   Lymphs Abs 3.9 0.7 - 4.0 K/uL   Monocytes Relative 7 %   Monocytes Absolute 1.0 0.1 - 1.0 K/uL   Eosinophils Relative 1 %   Eosinophils Absolute 0.1 0.0 - 0.5 K/uL   Basophils Relative 1 %   Basophils Absolute 0.1 0.0 - 0.1 K/uL   WBC Morphology MORPHOLOGY UNREMARKABLE    RBC Morphology POLYCHROMASIA    Smear Review PLATELET COUNT CONFIRMED BY SMEAR     Comment: Reviewed   Immature Granulocytes 1 %   Abs Immature Granulocytes 0.09 (H) 0.00 - 0.07 K/uL   Polychromasia PRESENT    Basophilic Stippling PRESENT     Comment: Performed at [facility], [address], [place], [place] [address]  Protime-INR     Status: None   Collection Time: 06/15/23  9:44 PM  Result Value Ref Range   Prothrombin Time 13.7 11.4 - 15.2 seconds   INR 1.0 0.8 - 1.2    Comment: (NOTE) INR goal varies based on device and disease states. Performed at [facility], [address] [place], [place] [address]   Culture, blood (Routine x 2)     Status: None (Preliminary result)   Collection Time: 06/15/23  9:44 PM   Specimen: BLOOD LEFT ARM  Result Value Ref Range   Specimen Description BLOOD LEFT ARM    Special Requests      BOTTLES DRAWN AEROBIC AND ANAEROBIC Blood Culture adequate volume    Culture      NO GROWTH < 12 HOURS Performed at [facility], [address], [place], [place] [address]    Report Status PENDING   Ammonia     Status: None   Collection Time: 06/15/23  9:44 PM  Result Value Ref Range   Ammonia 17 9 - 35 umol/L    Comment: Performed at [facility], [address], [place], [place] [address]  Troponin I (High Sensitivity)     Status: Abnormal   Collection Time: 06/15/23  9:44 PM  Result Value Ref Range   Troponin I (High Sensitivity) 27 (H) <18 ng/L    Comment: (NOTE) Elevated high sensitivity troponin I (hsTnI) values and significant  changes across serial measurements may suggest ACS but many other  chronic and acute conditions are known to elevate hsTnI results.  Refer to the "Links" section for chest pain algorithms and additional  guidance. Performed at [facility], [address] [place], [place] [address]   Culture, blood (Routine x 2)     Status: None (Preliminary result)   Collection Time: 06/15/23  9:55 PM   Specimen: Right Antecubital; Blood  Result Value Ref Range   Specimen Description RIGHT ANTECUBITAL    Special Requests      BOTTLES DRAWN AEROBIC ONLY Blood Culture adequate volume   Culture      NO GROWTH < 12 HOURS Performed at [facility], [address], [place], [place] [address]    Report Status PENDING   Resp panel by RT-PCR (RSV, Flu A&B, Covid) Anterior Nasal Swab     Status: None   Collection Time: 06/15/23 10:15 PM   Specimen: Anterior Nasal Swab  Result Value Ref Range   SARS Coronavirus 2 by RT PCR NEGATIVE NEGATIVE    Comment: (NOTE) SARS-CoV-2 target nucleic acids are NOT DETECTED.  The SARS-CoV-2 RNA is generally detectable in upper respiratory specimens during the acute phase of infection. The lowest concentration of SARS-CoV-2 viral copies this assay can detect is 138 copies/mL. A negative result does not preclude SARS-Cov-2 infection and should not be used as the sole basis for treatment or other patient  management decisions. A negative result may occur with  improper specimen collection/handling, submission of specimen other than nasopharyngeal swab, presence of viral mutation(s) within the areas targeted by this assay, and inadequate number of viral copies(<138 copies/mL). A negative result must be combined with clinical observations, patient history, and epidemiological information. The expected result is Negative.  Fact Sheet for Patients:  BloggerCourse.com  Fact Sheet for Healthcare Providers:  SeriousBroker.it  This test is no t yet approved or cleared by the [place] FDA and  has been authorized for detection and/or diagnosis of SARS-CoV-2 by FDA under an Emergency Use Authorization (EUA). This EUA will remain  in effect (meaning this test can be used) for the duration of the COVID-19 declaration under Section 564(b)(1) of the Act, 21 U.S.C.section 360bbb-3(b)(1), unless the authorization is terminated  or revoked sooner.       Influenza A by PCR NEGATIVE NEGATIVE   Influenza B by PCR NEGATIVE NEGATIVE    Comment: (NOTE) The Xpert Xpress SARS-CoV-2/FLU/RSV plus assay is intended as an aid in the diagnosis of influenza from Nasopharyngeal swab specimens and should not be used as a sole basis for treatment. Nasal washings and aspirates are unacceptable for Xpert Xpress SARS-CoV-2/FLU/RSV testing.  Fact Sheet for Patients: BloggerCourse.com  Fact Sheet for Healthcare Providers: SeriousBroker.it  This test is not yet approved or cleared by the [place] FDA and has been authorized for detection and/or diagnosis of SARS-CoV-2 by FDA under an Emergency Use Authorization (EUA). This EUA will remain in effect (meaning this test can be used) for the duration of the COVID-19 declaration under Section 564(b)(1) of the Act, 21 U.S.C. section 360bbb-3(b)(1), unless the  authorization is terminated or revoked.     Resp Syncytial Virus by PCR NEGATIVE NEGATIVE    Comment: (NOTE) Fact Sheet for Patients: BloggerCourse.com  Fact Sheet for Healthcare Providers: SeriousBroker.it  This test is not yet approved or cleared by the [place] FDA and has been authorized for detection and/or diagnosis of SARS-CoV-2 by FDA under an Emergency Use Authorization (EUA). This EUA will remain in effect (meaning this test can be used) for the duration of the COVID-19 declaration under Section 564(b)(1) of the Act, 21 U.S.C. section 360bbb-3(b)(1), unless the authorization is terminated or revoked.  Performed at [facility], [address], [place], [place] [address]   Lactic acid, plasma     Status: None   Collection Time: 06/15/23 11:25 PM  Result Value Ref Range   Lactic Acid, Venous 1.1 0.5 - 1.9 mmol/L    Comment: Performed at [facility], [address], [place], [place] [address]  Troponin I (High Sensitivity)     Status: Abnormal   Collection Time: 06/15/23 11:34 PM  Result Value Ref Range  Troponin I (High Sensitivity) 25 (H) <18 ng/L    Comment: (NOTE) Elevated high sensitivity troponin I (hsTnI) values and significant  changes across serial measurements may suggest ACS but many other  chronic and acute conditions are known to elevate hsTnI results.  Refer to the "Links" section for chest pain algorithms and additional  guidance. Performed at [facility], [address], [place], [place] [address]   Comprehensive metabolic panel     Status: Abnormal   Collection Time: 06/16/23  6:05 AM  Result Value Ref Range   Sodium 149 (H) 135 - 145 mmol/L   Potassium 4.1 3.5 - 5.1 mmol/L   Chloride 117 (H) 98 - 111 mmol/L   CO2 22 22 - 32 mmol/L   Glucose, Bld 342 (H) 70 - 99 mg/dL    Comment: Glucose reference range applies only to samples taken after fasting for at least 8 hours.   BUN 29 (H) 8 - 23  mg/dL   Creatinine, Ser 6.21 0.44 - 1.00 mg/dL   Calcium 8.6 (L) 8.9 - 10.3 mg/dL   Total Protein 6.0 (L) 6.5 - 8.1 g/dL   Albumin 2.6 (L) 3.5 - 5.0 g/dL   AST 23 15 - 41 U/L   ALT 33 0 - 44 U/L   Alkaline Phosphatase 64 38 - 126 U/L   Total Bilirubin 0.6 0.3 - 1.2 mg/dL   GFR, Estimated >30 >86 mL/min    Comment: (NOTE) Calculated using the CKD-EPI Creatinine Equation (2021)    Anion gap 10 5 - 15    Comment: Performed at [facility], [address], [place], [place] [address]  Magnesium     Status: None   Collection Time: 06/16/23  6:05 AM  Result Value Ref Range   Magnesium 2.1 1.7 - 2.4 mg/dL    Comment: Performed at [facility], [address] [place], [place] [address]  CBC with Differential/Platelet     Status: Abnormal   Collection Time: 06/16/23  6:05 AM  Result Value Ref Range   WBC 12.3 (H) 4.0 - 10.5 K/uL   RBC 3.91 3.87 - 5.11 MIL/uL   Hemoglobin 11.7 (L) 12.0 - 15.0 g/dL   HCT 28.4 13.2 - 44.0 %   MCV 96.9 80.0 - 100.0 fL   MCH 29.9 26.0 - 34.0 pg   MCHC 30.9 30.0 - 36.0 g/dL   RDW 10.2 72.5 - 36.6 %   Platelets 187 150 - 400 K/uL   nRBC 0.0 0.0 - 0.2 %   Neutrophils Relative % 70 %   Neutro Abs 8.8 (H) 1.7 - 7.7 K/uL   Lymphocytes Relative 22 %   Lymphs Abs 2.7 0.7 - 4.0 K/uL   Monocytes Relative 6 %   Monocytes Absolute 0.7 0.1 - 1.0 K/uL   Eosinophils Relative 1 %   Eosinophils Absolute 0.1 0.0 - 0.5 K/uL   Basophils Relative 1 %   Basophils Absolute 0.1 0.0 - 0.1 K/uL   Immature Granulocytes 0 %   Abs Immature Granulocytes 0.05 0.00 - 0.07 K/uL    Comment: Performed at [facility], [address], [place], [place] [address]  CBG monitoring, ED     Status: Abnormal   Collection Time: 06/16/23  8:13 AM  Result Value Ref Range   Glucose-Capillary 311 (H) 70 - 99 mg/dL    Comment: Glucose reference range applies only to samples taken after fasting for at least 8 hours.  CBG monitoring, ED     Status: Abnormal  Collection Time: 06/16/23 12:08 PM   Result Value Ref Range   Glucose-Capillary 268 (H) 70 - 99 mg/dL    Comment: Glucose reference range applies only to samples taken after fasting for at least 8 hours.  Basic metabolic panel     Status: Abnormal   Collection Time: 06/16/23  2:02 PM  Result Value Ref Range   Sodium 145 135 - 145 mmol/L   Potassium 4.2 3.5 - 5.1 mmol/L    Comment: HEMOLYSIS AT THIS LEVEL MAY AFFECT RESULT HEMOLYSIS AT THIS LEVEL MAY AFFECT RESULT    Chloride 114 (H) 98 - 111 mmol/L   CO2 19 (L) 22 - 32 mmol/L   Glucose, Bld 292 (H) 70 - 99 mg/dL    Comment: Glucose reference range applies only to samples taken after fasting for at least 8 hours.   BUN 25 (H) 8 - 23 mg/dL   Creatinine, Ser 1.19 0.44 - 1.00 mg/dL   Calcium 8.2 (L) 8.9 - 10.3 mg/dL   GFR, Estimated >14 >78 mL/min    Comment: (NOTE) Calculated using the CKD-EPI Creatinine Equation (2021)    Anion gap 12 5 - 15    Comment: Performed at [facility], [address] [place], [place] [address]   CT Head Wo Contrast  Result Date: 06/15/2023 CLINICAL DATA:  Mental status change, unknown cause. History of UTI and encephalopathy. EXAM: CT HEAD WITHOUT CONTRAST TECHNIQUE: Contiguous axial images were obtained from the base of the skull through the vertex without intravenous contrast. RADIATION DOSE REDUCTION: This exam was performed according to the departmental dose-optimization program which includes automated exposure control, adjustment of the mA and/or kV according to patient size and/or use of iterative reconstruction technique. COMPARISON:  06/04/2023. FINDINGS: Brain: No acute intracranial hemorrhage, midline shift or mass effect. No extra-axial fluid collection. Diffuse atrophy is noted. Extensive subcortical and periventricular white matter hypodensities are present bilaterally. An old infarct is noted in the basal ganglia on the left. Vascular: No hyperdense vessel or unexpected calcification. Skull: Normal. Negative for fracture or focal  lesion. Sinuses/Orbits: No acute finding. Other: None. IMPRESSION: 1. No acute intracranial process. 2. Stable atrophy with extensive chronic microvascular ischemic changes and old infarct. Electronically Signed   By: Thornell Sartorius M.D.   On: 06/15/2023 22:40   DG Chest Portable 1 View  Result Date: 06/15/2023 CLINICAL DATA:  Sepsis EXAM: PORTABLE CHEST 1 VIEW COMPARISON:  Chest x-ray 06/04/2023 FINDINGS: Loop recorder overlies the chest. The heart is enlarged. The lungs are clear. There is no pleural effusion or pneumothorax. No acute fractures are seen. IMPRESSION: Cardiomegaly. No acute cardiopulmonary process. Electronically Signed   By: Darliss Cheney M.D.   On: 06/15/2023 22:04    Pending Labs Unresulted Labs (From admission, onward)     Start     Ordered   06/17/23 0500  CBC  Tomorrow morning,   R        06/16/23 0857   06/17/23 0500  Basic metabolic panel  Tomorrow morning,   R        06/16/23 0857   06/16/23 0331  Urine Culture (for pregnant, neutropenic or urologic patients or patients with an indwelling urinary catheter)  (Urine Culture)  Add-on,   AD       Question:  Indication  Answer:  Altered mental status (if no other cause identified)   06/16/23 0331   06/15/23 2123  Urine Culture  Once,   R        06/15/23 2123  Vitals/Pain Today's Vitals   06/16/23 0753 06/16/23 1130 06/16/23 1145 06/16/23 1204  BP: 126/69 110/74  119/68  Pulse: 77 68 66 68  Resp: 16 17 19  (!) 21  Temp: 97.9 F (36.6 C)   97.9 F (36.6 C)  TempSrc: Oral   Oral  SpO2: 96% 91% 93% 95%  Weight:      Height:      PainSc: 0-No pain       Isolation Precautions No active isolations  Medications Medications  atorvastatin (LIPITOR) tablet 10 mg (10 mg Oral Given 06/16/23 0954)  metoprolol tartrate (LOPRESSOR) tablet 12.5 mg (12.5 mg Oral Given 06/16/23 0954)  LORazepam (ATIVAN) tablet 0.5 mg (0.5 mg Oral Given 06/16/23 0953)  levothyroxine (SYNTHROID) tablet 50 mcg (50 mcg Oral  Patient Refused/Not Given 06/16/23 0539)  famotidine (PEPCID) tablet 20 mg (has no administration in time range)  clopidogrel (PLAVIX) tablet 75 mg (75 mg Oral Given 06/16/23 0954)  melatonin tablet 6 mg (has no administration in time range)  gabapentin (NEURONTIN) capsule 300 mg (has no administration in time range)  insulin aspart (novoLOG) injection 0-15 Units (8 Units Subcutaneous Given 06/16/23 1305)  insulin aspart (novoLOG) injection 0-5 Units (has no administration in time range)  heparin injection 5,000 Units (5,000 Units Subcutaneous Given 06/16/23 1307)  acetaminophen (TYLENOL) tablet 650 mg (has no administration in time range)    Or  acetaminophen (TYLENOL) suppository 650 mg (has no administration in time range)  oxyCODONE (Oxy IR/ROXICODONE) immediate release tablet 5 mg (has no administration in time range)  ondansetron (ZOFRAN) tablet 4 mg (has no administration in time range)    Or  ondansetron (ZOFRAN) injection 4 mg (has no administration in time range)  linezolid (ZYVOX) IVPB 600 mg (0 mg Intravenous Stopped 06/16/23 1301)  0.45 % sodium chloride infusion ( Intravenous New Bag/Given 06/16/23 0752)  insulin detemir (LEVEMIR) injection 8 Units (8 Units Subcutaneous Given 06/16/23 1152)  cefTRIAXone (ROCEPHIN) 1 g in sodium chloride 0.9 % 100 mL IVPB (0 g Intravenous Stopped 06/16/23 1100)  acetaminophen (TYLENOL) suppository 650 mg (650 mg Rectal Given 06/15/23 2215)  sodium chloride 0.9 % bolus 1,000 mL (0 mLs Intravenous Stopped 06/16/23 0032)  linezolid (ZYVOX) IVPB 600 mg (0 mg Intravenous Stopped 06/16/23 0032)    Mobility non-ambulatory      R Recommendations: See Admitting Provider Note  Report given to: [facility]

## 2023-06-16 NOTE — Progress Notes (Signed)
   06/16/23 1720  TOC Brief Assessment  Insurance and Status Reviewed  Patient has primary care physician Yes  Home environment has been reviewed [facility]  Prior level of function: Needs Assistance  Prior/Current Home Services No current home services  Social Determinants of Health Reivew SDOH reviewed no interventions necessary  Readmission risk has been reviewed Yes  Transition of care needs transition of care needs identified, TOC will continue to follow

## 2023-06-16 NOTE — Assessment & Plan Note (Signed)
-   UA indicative of UTI - Last culture showed Enterococcus faecalis pansensitive - Patient was discharged on linezolid - Linezolid was again started in the ED - After speaking with pharmacy they prefer to continue linezolid - Blood culture pending - Urine culture pending - Leukocytosis at 14.6 - Continue to monitor

## 2023-06-16 NOTE — Assessment & Plan Note (Signed)
-   Continue Ativan and Lexapro

## 2023-06-16 NOTE — ED Notes (Signed)
Gave her her pills one at a time and was able to take them. She coughed a couple of times drinking the water at one point, but seemed to do okay. Am not sure she would pass the full swallow screen.

## 2023-06-16 NOTE — Assessment & Plan Note (Signed)
-   Patient has a temp of 102.1, heart rate 108, respiratory rate 23, leukocytosis 14.6, with acute metabolic encephalopathy - Negative COVID - Blood culture pending - Continue gentle hydration - UA indicative of UTI - See respective assessment and plan for UTI - No lactic acidosis - Continue to monitor

## 2023-06-16 NOTE — ED Notes (Signed)
Attempted to administer patient PO Levothyroxine per order. Patient able to swallow sip of water with no difficulty, but refused to open her mouth to take medication

## 2023-06-16 NOTE — Assessment & Plan Note (Signed)
Continue statin. 

## 2023-06-16 NOTE — Assessment & Plan Note (Signed)
-   Continue reduced dose of basal insulin - Sliding scale coverage - Monitor CBG

## 2023-06-16 NOTE — Assessment & Plan Note (Signed)
-   Last TSH was normal - Continue Synthroid

## 2023-06-16 NOTE — Assessment & Plan Note (Signed)
-   Appears volume down - Hypernatremia 150 - Continue gentle hydration with half-normal saline - Monitor in the a.m. labs

## 2023-06-16 NOTE — Progress Notes (Signed)
[facility] PROGRESS NOTE  Dawn Ho (DOB: [birth year]) [id number]:[id number] PCP: Bernerd Limbo, MD  Brief Narrative: Dawn Ho is a 72 y.o. female with a history of CVA, T2DM, HLD, HTN, depression/anxiety, and recent admission for Enterococcus UTI (discharged to [facility] 10/20) who presented to the ED on 06/15/2023 due to AMS, decreased per oral intake. She was found to be febrile (102.65F), tachycardic, with leukocytosis (WBC 14.6k) with significant pyuria and hypernatremia (Na 150). Neuroimaging with severe chronic changes but no acute abnormalities. She was admitted this morning for acute metabolic encephalopathy due to sepsis due to UTI, continued on linezolid.   Subjective: Unchanged from Dr. Elita Boone H&P, she moans and tracks but verbalized very little. Took water from [employer]. Glucose >300.  Objective: BP 126/69 (BP Location: Right Arm)   Pulse 77   Temp 97.9 F (36.6 C) (Oral)   Resp 16   Ht 5' (1.524 m)   Wt 74 kg   SpO2 96%   BMI 31.86 kg/m   Gen: Obese frail elderly female with relatively diminished muscle bulk Pulm: Clear, nonlabored  CV: RRR, no MRG or pitting edema GI: Soft, NT, ND, +BS  Neuro: Alert, moves extremities, not following all directions for full exam.  Assessment & Plan: Acute metabolic encephalopathy due to sepsis due to urinary tract infection:  - Continued linezolid. Given its lack of gram negative coverage and persistent pyuria (>50 WBCs with clumps on micro and many bacteria) and symptoms, will add ceftriaxone. Previous urine culture data available shows E. coli UTI's in 2019 (amp, gent resistance) and 2016 (pansensitive), and a polymicrobial urine Cx 2009, and suspected contaminant CONS blood culture.  - Monitor urine and blood cultures  Hypernatremia: Suspect low free water, +/- low total body sodium. This is similar to prior admission as well. Actual Na value likely higher than reported with her hyperglycemia.  - Increase rate of 1/2NS  from 40cc/hr, since she's not really taking po, and recheck labs 8 hrs after last check.   T2DM:  - Continue mod SSI and monitoring closely. Give levemir this AM, missed yesterday.  Otherwise, per H&P this AM.   Tyrone Nine, MD [facility] [email or web address] 06/16/2023, 8:43 AM

## 2023-06-16 NOTE — Assessment & Plan Note (Signed)
-   CT head shows no acute changes - Likely related to sepsis see respective assessment and plan

## 2023-06-16 NOTE — Progress Notes (Signed)
Pt arrived to floor awake disoriented patient knows name disoriented x3. Some difficulty with speech ER RN reports patient failed bedside swallow screen coughing after thin liquids. IV 0.45 NS infusing. Extremities weakness greater in the left upper extremity. Pt unable to answer admission questions. Some left eye droop present. L pupil oval shaped with some rough edges does not respond to light. R pupil round and reactive. MD informed. STAT CT ordered BP 117/75 HR 76 o2 98 temp 97.14f respirations regular and equal rise and fall. 20 respirations. Bed alarm placed on call light within reach.

## 2023-06-17 DIAGNOSIS — G9341 Metabolic encephalopathy: Secondary | ICD-10-CM | POA: Diagnosis not present

## 2023-06-17 DIAGNOSIS — A419 Sepsis, unspecified organism: Secondary | ICD-10-CM | POA: Diagnosis not present

## 2023-06-17 DIAGNOSIS — R6520 Severe sepsis without septic shock: Secondary | ICD-10-CM | POA: Diagnosis not present

## 2023-06-17 LAB — BASIC METABOLIC PANEL
Anion gap: 7 (ref 5–15)
BUN: 23 mg/dL (ref 8–23)
CO2: 25 mmol/L (ref 22–32)
Calcium: 8.4 mg/dL — ABNORMAL LOW (ref 8.9–10.3)
Chloride: 115 mmol/L — ABNORMAL HIGH (ref 98–111)
Creatinine, Ser: 0.78 mg/dL (ref 0.44–1.00)
GFR, Estimated: >60 mL/min (ref >60)
Glucose, Bld: 157 mg/dL — ABNORMAL HIGH (ref 70–99)
Potassium: 3.5 mmol/L (ref 3.5–5.1)
Sodium: 147 mmol/L — ABNORMAL HIGH (ref 135–145)

## 2023-06-17 LAB — CBC
HCT: 33.7 % — ABNORMAL LOW (ref 36.0–46.0)
Hemoglobin: 10.2 g/dL — ABNORMAL LOW (ref 12.0–15.0)
MCH: 29.4 pg (ref 26.0–34.0)
MCHC: 30.3 g/dL (ref 30.0–36.0)
MCV: 97.1 fL (ref 80.0–100.0)
Platelets: 163 10*3/uL (ref 150–400)
RBC: 3.47 MIL/uL — ABNORMAL LOW (ref 3.87–5.11)
RDW: 14.1 % (ref 11.5–15.5)
WBC: 11.4 10*3/uL — ABNORMAL HIGH (ref 4.0–10.5)
nRBC: 0 % (ref 0.0–0.2)

## 2023-06-17 LAB — GLUCOSE, CAPILLARY
Glucose-Capillary: 170 mg/dL — ABNORMAL HIGH (ref 70–99)
Glucose-Capillary: 219 mg/dL — ABNORMAL HIGH (ref 70–99)
Glucose-Capillary: 120 mg/dL — ABNORMAL HIGH (ref 70–99)
Glucose-Capillary: 115 mg/dL — ABNORMAL HIGH (ref 70–99)

## 2023-06-17 MED ORDER — POTASSIUM CHLORIDE IN NACL 20-0.45 MEQ/L-% IV SOLN: INTRAVENOUS | Status: AC

## 2023-06-17 NOTE — TOC Initial Note (Signed)
Transition of Care [facility]) - Initial/Assessment Note    Patient Details  Name: Dawn Ho MRN: [id number] Date of Birth: [birth year]  [facility]) CM/SW Contact:    Villa Herb, LCSWA Phone Number: 06/17/2023, 3:43 PM  Clinical Narrative:                 Pt arrived to hospital from [facility]. CSW spoke to [place] with [employer] who states pt can return once medically stable. Pt will need insurance auth started prior to pts return. PT eval is pending at this time. TOC to follow.   Expected Discharge Plan: Skilled Nursing Facility Barriers to Discharge: Continued Medical Work up   Patient Goals and CMS Choice Patient states their goals for this hospitalization and ongoing recovery are:: return to LTC CMS Medicare.gov Compare Post Acute Care list provided to:: Patient Choice offered to / list presented to : Patient      Expected Discharge Plan and Services In-house Referral: Clinical Social Work Discharge Planning Services: CM Consult Post Acute Care Choice: Skilled Nursing Facility Living arrangements for the past 2 months: Skilled Nursing Facility                                      Prior Living Arrangements/Services Living arrangements for the past 2 months: Skilled Nursing Facility Lives with:: Facility Resident Patient language and need for interpreter reviewed:: Yes Do you feel safe going back to the place where you live?: Yes      Need for Family Participation in Patient Care: Yes (Comment) Care giver support system in place?: Yes (comment)   Criminal Activity/Legal Involvement Pertinent to Current Situation/Hospitalization: No - Comment as needed  Activities of Daily Living      Permission Sought/Granted                  Emotional Assessment Appearance:: Appears stated age Attitude/Demeanor/Rapport: Engaged Affect (typically observed): Accepting   Alcohol / Substance Use: Not Applicable Psych Involvement: No  (comment)  Admission diagnosis:  Hypernatremia [E87.0] Fall [W19.XXXA] Sepsis (HCC) [A41.9] Urinary tract infection without hematuria, site unspecified [N39.0] Altered mental status, unspecified altered mental status type [R41.82] Patient Active Problem List   Diagnosis Date Noted   Dehydration 06/16/2023   Hypothyroidism 06/16/2023   Fall 06/15/2023   Sepsis (HCC) 06/15/2023   Acute metabolic encephalopathy 06/04/2023   Uncontrolled type 2 diabetes mellitus with hyperglycemia, with long-term current use of insulin (HCC) 06/04/2023   Hematuria 06/04/2023   Hypernatremia 06/04/2023   AKI (acute kidney injury) (HCC) 06/04/2023   Nephrolithiasis 04/18/2022   Closed right hip fracture, initial encounter (HCC) 09/09/2020   Hypomagnesemia 11/12/2017   Aspiration into airway    Acute respiratory distress    Lung crackles    Ileus (HCC)    Abdominal distension    Leukocytosis    Diarrhea    UTI (urinary tract infection) 10/31/2017   Hypokalemia 10/31/2017   Cryptogenic stroke (HCC) 01/12/2015   Spastic hemiparesis (HCC) 01/12/2015   HLD (hyperlipidemia)    History of recent stroke    Accelerated hypertension    Acute cystitis without hematuria    Tachycardia    Stroke (HCC) 11/17/2014   Left-sided weakness 11/17/2014   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 [place] [address] Phone: [phone] Fax: [phone]  [facility] [place], [place] - [address] [place] [address] Phone: [phone] Fax: [phone]     Social Determinants of Health (SDOH) Social History: SDOH Screenings   Food Insecurity: Patient Unable To Answer (06/04/2023)  Housing: Patient Unable To Answer (06/04/2023)  Transportation Needs: Patient Unable To Answer (06/04/2023)  Utilities: Patient Unable To Answer (06/04/2023)  Tobacco Use: Low Risk  (06/15/2023)   SDOH Interventions:     Readmission Risk Interventions    06/17/2023    3:42 PM  Readmission Risk Prevention Plan  Transportation Screening Complete  Home Care Screening Complete  Medication Review (RN CM) Complete

## 2023-06-17 NOTE — Progress Notes (Signed)
[facility] PROGRESS NOTE  Dawn Ho (DOB: 01-Feb-1951) MWU:[id number] PCP: Dawn Limbo, MD  Brief Narrative: Dawn Ho is a 72 y.o. female with a history of CVA, T2DM, HLD, HTN, depression/anxiety, and recent admission for Enterococcus UTI (discharged to [facility] 10/20) who presented to the ED on 06/15/2023 due to AMS, decreased per oral intake. She was found to be febrile (102.90F), tachycardic, with leukocytosis (WBC 14.6k) with significant pyuria and hypernatremia (Na 150). Neuroimaging with severe chronic changes but no acute abnormalities. She was admitted this morning for acute metabolic encephalopathy due to sepsis due to UTI, continued on linezolid.   Subjective: Not hungry or thirsty, responds to only some questions, says she has pain but does not answer where it is and has no point tenderness throughout exam. Holding right arm close to chest though has stable strength bilaterally.   Objective: BP 131/71 (BP Location: Right Arm)   Pulse 67   Temp 98 F (36.7 C)   Resp 20   Ht 5' (1.524 m)   Wt 74 kg   SpO2 99%   BMI 31.86 kg/m   Gen: Obese, frail elderly female in no acute distress HEENT: Poor dentition, maintaining airway, pupil asymmetry is stable, irregularity on left stable.  Pulm: Nonlabored, clear anterolaterally  CV: RRR, no MRG GI: Soft, NT, ND, +BS  Neuro: Alert, not oriented, does not follow most commands.  Ext: Warm, no deformities Skin: No rashes, lesions or ulcers on visualized skin   Previous Cx's: E. coli UTI's in 2019 (amp, gent resistance) and 2016 (pansensitive), and a polymicrobial urine Cx 2009, and suspected contaminant CONS blood culture.   Assessment & Plan: Acute metabolic encephalopathy due to sepsis due to urinary tract infection: Head CT negative at admission and again ~24 hours later, though chronic ischemic disease is significant.  - Continued linezolid. Given its lack of gram negative coverage and persistent pyuria (>50 WBCs  with clumps on micro and many bacteria) and symptoms, added ceftriaxone.  - Monitor urine and blood cultures. NGTD.  - Leukocytosis slightly improved. Afebrile.  Hypernatremia: Suspect low free water, +/- low total body sodium. This is similar to prior admission as well. Actual Na value likely higher than reported with her hyperglycemia.  - Resolved with gentle hypotonic saline, then recurrent, will restart maintenance IV fluids as she continues to have no significant per oral intake.   T2DM: Glucose improved. HbA1c 8.3%. - Continue mod SSI and monitoring closely.   AKI: SCr improved with IV fluid support.  - Restart IVF and monitor.   Demand myocardial ischemia: Troponin trending downward 27 > 25, no chest pain or ischemic ECG changes. - No inpatient work up planned.   Hypothyroidism: TSH 1.769.  - Continue synthroid.  HLD:  - Continue statin  Depression, anxiety:  - Continue home Tx including SSRI, lorazepam.   Dawn Nine, MD [facility] [email or web address] 06/17/2023, 9:25 AM

## 2023-06-18 DIAGNOSIS — R6520 Severe sepsis without septic shock: Secondary | ICD-10-CM | POA: Diagnosis not present

## 2023-06-18 DIAGNOSIS — A419 Sepsis, unspecified organism: Secondary | ICD-10-CM | POA: Diagnosis not present

## 2023-06-18 DIAGNOSIS — G9341 Metabolic encephalopathy: Secondary | ICD-10-CM | POA: Diagnosis not present

## 2023-06-18 LAB — BASIC METABOLIC PANEL
Anion gap: 7 (ref 5–15)
BUN: 19 mg/dL (ref 8–23)
CO2: 22 mmol/L (ref 22–32)
Calcium: 8.2 mg/dL — ABNORMAL LOW (ref 8.9–10.3)
Chloride: 112 mmol/L — ABNORMAL HIGH (ref 98–111)
Creatinine, Ser: 0.68 mg/dL (ref 0.44–1.00)
GFR, Estimated: >60 mL/min (ref >60)
Glucose, Bld: 146 mg/dL — ABNORMAL HIGH (ref 70–99)
Potassium: 3.6 mmol/L (ref 3.5–5.1)
Sodium: 141 mmol/L (ref 135–145)

## 2023-06-18 LAB — GLUCOSE, CAPILLARY
Glucose-Capillary: 127 mg/dL — ABNORMAL HIGH (ref 70–99)
Glucose-Capillary: 160 mg/dL — ABNORMAL HIGH (ref 70–99)
Glucose-Capillary: 126 mg/dL — ABNORMAL HIGH (ref 70–99)
Glucose-Capillary: 129 mg/dL — ABNORMAL HIGH (ref 70–99)

## 2023-06-18 LAB — URINE CULTURE: Culture: >=100000 — AB

## 2023-06-18 MED ORDER — POTASSIUM CHLORIDE IN NACL 20-0.45 MEQ/L-% IV SOLN
INTRAVENOUS | Status: AC
Start: 2023-06-18 — End: 2023-06-19

## 2023-06-18 MED ORDER — ESCITALOPRAM OXALATE 10 MG PO TABS
20.0000 mg | ORAL_TABLET | Freq: Every day | ORAL | Status: DC
  Administered 2023-06-18 – 2023-06-21 (×4): 20 mg via ORAL
  Filled 2023-06-18 (×4): qty 2

## 2023-06-18 MED ORDER — SODIUM CHLORIDE 0.9 % IV SOLN
1.0000 g | Freq: Three times a day (TID) | INTRAVENOUS | Status: DC
  Administered 2023-06-18 – 2023-06-21 (×10): 1 g via INTRAVENOUS
  Filled 2023-06-18 (×10): qty 20

## 2023-06-18 NOTE — Evaluation (Signed)
Clinical/Bedside Swallow Evaluation Patient Details  Name: Dawn Ho MRN: [id number] Date of Birth: 25-Aug-1950  Today's Date: 06/18/2023 Time: SLP Start Time (ACUTE ONLY): 1259 SLP Stop Time (ACUTE ONLY): 1325 SLP Time Calculation (min) (ACUTE ONLY): 26 min  Past Medical History:  Past Medical History:  Diagnosis Date   Cataract, left eye    CVA (cerebral infarction)    Depression with anxiety    Diabetes mellitus    Dyslipidemia    Fibromyalgia    Hypertension    Retinopathy    Stroke (HCC)    L. hemiparesis   TIA (transient ischemic attack)    Past Surgical History:  Past Surgical History:  Procedure Laterality Date   ABDOMINAL HYSTERECTOMY     age 53, "removed one ovary- excessive bleeding"   CESAREAN SECTION     CHOLECYSTECTOMY     LOOP RECORDER IMPLANT     Dr. Johney Frame placed 10/15/2014   ORIF HIP FRACTURE Right 09/10/2020   Procedure: im nail;  Surgeon: Eldred Manges, MD;  Location: [facility] ORS;  Service: Orthopedics;  Laterality: Right;   TEE WITHOUT CARDIOVERSION N/A 10/15/2014   Procedure: TRANSESOPHAGEAL ECHOCARDIOGRAM (TEE);  Surgeon: Pricilla Riffle, MD;  Location: [facility] ENDOSCOPY;  Service: Cardiovascular;  Laterality: N/A;   HPI:  [name] is a 72 y.o. female with a history of CVA, T2DM, HLD, HTN, depression/anxiety, and recent admission for Enterococcus UTI (discharged to [facility] 10/20) who presented to the ED on 06/15/2023 due to AMS, decreased per oral intake. She was found to be febrile (102.35F), tachycardic, with leukocytosis (WBC 14.6k) with significant pyuria and hypernatremia (Na 150). Neuroimaging with severe chronic changes but no acute abnormalities. She was admitted for acute metabolic encephalopathy due to sepsis due to UTI, continued on linezolid. Course of linezolid has been completed. ESBL E. coli grew on admission urine culture so meropenem is started 10/29. Pt's hypernatremia has resolved with IVF, though has not taken anything by mouth since arrival. BSE  requested. Pt just seen by SLP last week during hospitalization and was on puree and thin liquids.    Assessment / Plan / Recommendation  Clinical Impression  Clinical swallow evaluation completed in room with Pt upright in bed. Pt with robust vocal quality, which is improved from when she was seen just over a week ago here. Pt with sparse dentition. She consumed thin liquids via straw sip, puree, expectorated pieces of graham cracker, and chewed/swallowed mech soft with verbal cues. Pt without overt signs of aspiration. She does exhibit impaired oral phase for solid textures, but able to masticate D2 and some mech soft. Recommend D2/thin and PO medications in puree. SLP will follow for diet tolerance, above to RN. SLP Visit Diagnosis: Dysphagia, unspecified (R13.10)    Aspiration Risk  Mild aspiration risk    Diet Recommendation Dysphagia 2 (Fine chop);Thin liquid    Liquid Administration via: Cup;Straw Medication Administration: Crushed with puree Supervision: Full supervision/cueing for compensatory strategies Compensations: Small sips/bites;Minimize environmental distractions;Lingual sweep for clearance of pocketing Postural Changes: Seated upright at 90 degrees;Remain upright for at least 30 minutes after po intake    Other  Recommendations Oral Care Recommendations: Oral care before and after PO    Recommendations for follow up therapy are one component of a multi-disciplinary discharge planning process, led by the attending physician.  Recommendations may be updated based on patient status, additional functional criteria and insurance authorization.  Follow up Recommendations Follow physician's recommendations for discharge plan and follow up therapies  Assistance Recommended at Discharge    Functional Status Assessment Patient has had a recent decline in their functional status and/or demonstrates limited ability to make significant improvements in function in a reasonable and  predictable amount of time  Frequency and Duration min 2x/week  1 week       Prognosis Prognosis for improved oropharyngeal function: Fair Barriers to Reach Goals: Cognitive deficits      Swallow Study   General Date of Onset: 06/15/23 HPI: [name] is a 72 y.o. female with a history of CVA, T2DM, HLD, HTN, depression/anxiety, and recent admission for Enterococcus UTI (discharged to [facility] 10/20) who presented to the ED on 06/15/2023 due to AMS, decreased per oral intake. She was found to be febrile (102.43F), tachycardic, with leukocytosis (WBC 14.6k) with significant pyuria and hypernatremia (Na 150). Neuroimaging with severe chronic changes but no acute abnormalities. She was admitted for acute metabolic encephalopathy due to sepsis due to UTI, continued on linezolid. Course of linezolid has been completed. ESBL E. coli grew on admission urine culture so meropenem is started 10/29. Pt's hypernatremia has resolved with IVF, though has not taken anything by mouth since arrival. BSE requested. Pt just seen by SLP last week during hospitalization and was on puree and thin liquids. Type of Study: Bedside Swallow Evaluation Previous Swallow Assessment: BSE last week Diet Prior to this Study: Dysphagia 1 (pureed);Thin liquids (Level 0) Temperature Spikes Noted: No Respiratory Status: Room air History of Recent Intubation: No Behavior/Cognition: Alert;Cooperative;Requires cueing Oral Cavity Assessment: Within Functional Limits Oral Care Completed by SLP: Yes Oral Cavity - Dentition: Poor condition;Missing dentition Vision: Functional for self-feeding Self-Feeding Abilities: Needs assist;Needs set up Patient Positioning: Upright in bed Baseline Vocal Quality: Normal Volitional Cough: Weak Volitional Swallow: Able to elicit    Oral/Motor/Sensory Function Overall Oral Motor/Sensory Function: Generalized oral weakness   Ice Chips Ice chips: Not tested   Thin Liquid Thin Liquid: Within  functional limits Presentation: Self Fed;Straw    Nectar Thick Nectar Thick Liquid: Not tested   Honey Thick Honey Thick Liquid: Not tested   Puree Puree: Within functional limits Presentation: Spoon   Solid     Solid: Impaired Presentation: Spoon;Self Fed Oral Phase Impairments: Reduced lingual movement/coordination;Impaired mastication Oral Phase Functional Implications:  (expectorated piece of cracker, but masticated when placed in puree)     Thank you,  Havery Moros, CCC-SLP [phone]  Brisha Mccabe 06/18/2023,1:29 PM

## 2023-06-18 NOTE — Plan of Care (Signed)
  Problem: Education: Goal: Ability to describe self-care measures that may prevent or decrease complications (Diabetes Survival Skills Education) will improve Outcome: Not Met (add Reason)   Problem: Coping: Goal: Ability to adjust to condition or change in health will improve Outcome: Not Met (add Reason)   Problem: Fluid Volume: Goal: Ability to maintain a balanced intake and output will improve Outcome: Not Met (add Reason)   Problem: Health Behavior/Discharge Planning: Goal: Ability to manage health-related needs will improve Outcome: Not Met (add Reason)   Problem: Skin Integrity: Goal: Risk for impaired skin integrity will decrease Outcome: Not Met (add Reason)   Problem: Education: Goal: Knowledge of General Education information will improve Description: Including pain rating scale, medication(s)/side effects and non-pharmacologic comfort measures Outcome: Not Met (add Reason)   Problem: Health Behavior/Discharge Planning: Goal: Ability to manage health-related needs will improve Outcome: Not Met (add Reason)

## 2023-06-18 NOTE — Progress Notes (Signed)
[facility] PROGRESS NOTE  Dawn Ho (DOB: 03-29-1951) ZOX:[id number] PCP: Bernerd Limbo, MD  Brief Narrative: Dawn Ho is a 72 y.o. female with a history of CVA, T2DM, HLD, HTN, depression/anxiety, and recent admission for Enterococcus UTI (discharged to [facility] 10/20) who presented to the ED on 06/15/2023 due to AMS, decreased per oral intake. She was found to be febrile (102.33F), tachycardic, with leukocytosis (WBC 14.6k) with significant pyuria and hypernatremia (Na 150). Neuroimaging with severe chronic changes but no acute abnormalities. She was admitted for acute metabolic encephalopathy due to sepsis due to UTI, continued on linezolid. Course of linezolid has been completed. ESBL E. coli grew on admission urine culture so meropenem is started 10/29. Pt's hypernatremia has resolved with IVF, though has not taken anything by mouth since arrival. Palliative care consulted for assistance with goals of care discussions.  Subjective: Today she moans less, does track and verbalizes some words, repeating "urinary tract infection" and seems to say "kind of" when asked if she's hungry. Denies pain.  Objective: BP 125/73 (BP Location: Right Arm)   Pulse 62   Temp 98.2 F (36.8 C) (Axillary)   Resp 20   Ht 5' (1.524 m)   Wt 74 kg   SpO2 100%   BMI 31.86 kg/m   Gen: Obese frail elderly female in no distress HEENT: L pupil irregularity stable Pulm: Clear, nonlabored  CV: RRR, no MRG GI: Soft, NT, ND, +BS, +suprapubic tenderness today Neuro: R[place] and is confused, limited exam but no new focal deficits. Ext: Warm, no deformities Skin: No wounds on visualized skin   Previous Cx's: E. coli UTI's in 2019 (amp, gent resistance) and 2016 (pansensitive), and a polymicrobial urine Cx 2009, and suspected contaminant CONS blood culture.   Assessment & Plan: Acute metabolic encephalopathy due to sepsis due to ESBL UTI: Head CT negative at admission and again ~24 hours later,  though chronic ischemic disease is significant.  - Continued linezolid per prior plan, completed course. E. coli on culture is ESBL, pt still altered and still with leukocytosis. Hemodynamic improvement likely from IVF. Change to meropenem 10/29.   - Monitor blood cultures. NGTD.  - Will consult palliative care given persistent inability to take po for assistance with discussions Re: nutrition/artificial feeding  Hypernatremia: Suspect low free water, +/- low total body sodium. This is similar to prior admission as well.  - Resolved, continue maintenance IV fluids as she continues to have no significant per oral intake.   T2DM: Glucose improved. HbA1c 8.3%. - Continue mod SSI and monitoring closely. At inpatient goal.   AKI: SCr improved with IV fluid support.  - Restarted IVF, avoid nephrotoxins.  Demand myocardial ischemia: Troponin trending downward 27 > 25, no chest pain or ischemic ECG changes. - No inpatient work up planned.   Hypothyroidism: TSH 1.769.  - Continue synthroid.  HLD:  - Continue statin  Depression, anxiety:  - Continue home Tx including SSRI, lorazepam.   Tyrone Nine, MD [facility] [email or web address] 06/18/2023, 10:20 AM

## 2023-06-19 DIAGNOSIS — A4151 Sepsis due to Escherichia coli [E. coli]: Secondary | ICD-10-CM | POA: Diagnosis not present

## 2023-06-19 DIAGNOSIS — G9341 Metabolic encephalopathy: Secondary | ICD-10-CM | POA: Diagnosis not present

## 2023-06-19 DIAGNOSIS — Z794 Long term (current) use of insulin: Secondary | ICD-10-CM

## 2023-06-19 DIAGNOSIS — E1165 Type 2 diabetes mellitus with hyperglycemia: Secondary | ICD-10-CM

## 2023-06-19 DIAGNOSIS — E870 Hyperosmolality and hypernatremia: Secondary | ICD-10-CM | POA: Diagnosis not present

## 2023-06-19 LAB — BASIC METABOLIC PANEL
Anion gap: 7 (ref 5–15)
BUN: 14 mg/dL (ref 8–23)
CO2: 21 mmol/L — ABNORMAL LOW (ref 22–32)
Calcium: 8.4 mg/dL — ABNORMAL LOW (ref 8.9–10.3)
Chloride: 113 mmol/L — ABNORMAL HIGH (ref 98–111)
Creatinine, Ser: 0.75 mg/dL (ref 0.44–1.00)
GFR, Estimated: >60 mL/min (ref >60)
Glucose, Bld: 144 mg/dL — ABNORMAL HIGH (ref 70–99)
Potassium: 4.6 mmol/L (ref 3.5–5.1)
Sodium: 141 mmol/L (ref 135–145)

## 2023-06-19 LAB — CBC
HCT: 30.7 % — ABNORMAL LOW (ref 36.0–46.0)
Hemoglobin: 9.7 g/dL — ABNORMAL LOW (ref 12.0–15.0)
MCH: 30.1 pg (ref 26.0–34.0)
MCHC: 31.6 g/dL (ref 30.0–36.0)
MCV: 95.3 fL (ref 80.0–100.0)
Platelets: 119 10*3/uL — ABNORMAL LOW (ref 150–400)
RBC: 3.22 MIL/uL — ABNORMAL LOW (ref 3.87–5.11)
RDW: 14.2 % (ref 11.5–15.5)
WBC: 8.7 10*3/uL (ref 4.0–10.5)
nRBC: 0.2 % (ref 0.0–0.2)

## 2023-06-19 LAB — GLUCOSE, CAPILLARY
Glucose-Capillary: 94 mg/dL (ref 70–99)
Glucose-Capillary: 130 mg/dL — ABNORMAL HIGH (ref 70–99)
Glucose-Capillary: 131 mg/dL — ABNORMAL HIGH (ref 70–99)
Glucose-Capillary: 177 mg/dL — ABNORMAL HIGH (ref 70–99)

## 2023-06-19 MED ORDER — GERHARDT'S BUTT CREAM
TOPICAL_CREAM | Freq: Every day | CUTANEOUS | Status: DC
  Filled 2023-06-19: qty 1

## 2023-06-19 NOTE — Plan of Care (Signed)
  Problem: Education: Goal: Individualized Educational Video(s) Outcome: Progressing   Problem: Education: Goal: Ability to describe self-care measures that may prevent or decrease complications (Diabetes Survival Skills Education) will improve Outcome: Progressing   Problem: Coping: Goal: Ability to adjust to condition or change in health will improve Outcome: Progressing

## 2023-06-19 NOTE — Progress Notes (Signed)
PROGRESS NOTE  Dawn Ho NAT:[id number] DOB: March 31, 1951 DOA: 06/15/2023 PCP: Bernerd Limbo, MD  Brief History:   72 y.o. female with a history of CVA with left hemiparesis, T2DM, HLD, HTN, depression/anxiety,  fibromyalgia, bilateral staghorn calculi, and right femoral fracture status post IM nail 08/2020 presents from [facility] and recent admission for Enterococcus UTI (discharged to [facility] 10/20) who presented to the ED on 06/15/2023 due to AMS, decreased per oral intake. She was found to be febrile (102.40F), tachycardic, with leukocytosis (WBC 14.6k) with significant pyuria and hypernatremia (Na 150). Neuroimaging with severe chronic changes but no acute abnormalities. She was admitted for acute metabolic encephalopathy due to sepsis due to UTI, continued on linezolid. Course of linezolid has been completed. ESBL E. coli grew on admission urine culture so meropenem is started 10/29. Pt's hypernatremia has resolved with IVF, though has not taken anything by mouth since arrival. Palliative care consulted for assistance with goals of care discussions.     Assessment/Plan:  Sepsis -Present on admission -Presented with leukocytosis, tachycardia and fever up to 102.1 F. -Secondary to UTI -UA > 50 WBC -06/04/2023 blood cultures negative -Lactic acid 1.3>> 1.1 -Chest x-ray--no infiltrates -sepsis physiology resolved   UTI -Urine culture showed E coli--ESBL -merrem started 10/29 -blood cultures neg   Acute metabolic encephalopathy -Multifactorial including sepsis/UTI, hypernatremia, AKI -Ammonia 26 -B12 1048 -TSH 1.769 -06/16/2023 CT brain negative  AKI -baseline 0.6-0.7 -presented with serum creatinine 1.07   Hypernatremia -Presented with sodium 150 -Restarted hypotonic fluid -Hold furosemide--do not plan to restart as she has poor po intake at baseline   Uncontrolled diabetes mellitus type 2 with hyperglycemia -06/04/2023 hemoglobin A1c 8.3 -Continue  Semglee 8 units -Continue NovoLog sliding scale   Essential hypertension -holding metoprolol secondary to soft blood pressure   Metabolic acidosis -Presented with bicarb 19   Personal history of PCN allergy -pt is able to tolerate carbapenems  HLD:  - Continue statin   Depression, anxiety:  - Continue home Tx including SSRI, lorazepam.  -PDMP reviewed--lorazepam 1 mg, #60 filled 06/11/23       Family Communication: no  Family at bedside  Consultants:  none  Code Status:  FULL   DVT Prophylaxis:  [place] Heparin   Procedures: As Listed in Progress Note Above  Antibiotics: Merrem 10/29>>.dt       Subjective: Patient denies fevers, chills, headache, chest pain, dyspnea, nausea, vomiting, diarrhea, abdominal pain, dysuria,   Objective: Vitals:   06/18/23 1242 06/18/23 2140 06/19/23 0200 06/19/23 1319  BP: 136/64 97/77 106/61 98/75  Pulse: (!) 59 62 61 75  Resp: 16 20 18 16   Temp: 98 F (36.7 C) 98 F (36.7 C) (!) 97.5 F (36.4 C) 98 F (36.7 C)  TempSrc: Oral  Oral Oral  SpO2: 98% 100% 100% 97%  Weight:      Height:        Intake/Output Summary (Last 24 hours) at 06/19/2023 1755 Last data filed at 06/19/2023 1744 Gross per 24 hour  Intake 1615.63 ml  Output --  Net 1615.63 ml   Weight change:  Exam:  General:  Pt is alert, follows commands appropriately, not in acute distress HEENT: No icterus, No thrush, No neck mass, [place]/AT Cardiovascular: RRR, S1/S2, no rubs, no gallops Respiratory: bibasilar crackles.  No wheeze Abdomen: Soft/+BS, non tender, non distended, no guarding Extremities: No edema, No lymphangitis, No petechiae, No rashes, no synovitis   Data Reviewed: I have  personally reviewed following labs and imaging studies Basic Metabolic Panel: Recent Labs  Lab 06/16/23 0605 06/16/23 1402 06/17/23 0456 06/18/23 0826 06/19/23 1213  NA 149* 145 147* 141 141  K 4.1 4.2 3.5 3.6 4.6  CL 117* 114* 115* 112* 113*  CO2 22 19* 25 22  21*  GLUCOSE 342* 292* 157* 146* 144*  BUN 29* 25* 23 19 14   CREATININE 0.98 0.95 0.78 0.68 0.75  CALCIUM 8.6* 8.2* 8.4* 8.2* 8.4*  MG 2.1  --   --   --   --    Liver Function Tests: Recent Labs  Lab 06/15/23 2144 06/16/23 0605  AST 20 23  ALT 34 33  ALKPHOS 68 64  BILITOT 0.7 0.6  PROT 6.3* 6.0*  ALBUMIN 2.9* 2.6*   No results for input(s): "LIPASE", "AMYLASE" in the last 168 hours. Recent Labs  Lab 06/15/23 2144  AMMONIA 17   Coagulation Profile: Recent Labs  Lab 06/15/23 2144  INR 1.0   CBC: Recent Labs  Lab 06/15/23 2144 06/16/23 0605 06/17/23 0456 06/19/23 1213  WBC 14.6* 12.3* 11.4* 8.7  NEUTROABS 9.5* 8.8*  --   --   HGB 11.9* 11.7* 10.2* 9.7*  HCT 38.7 37.9 33.7* 30.7*  MCV 97.2 96.9 97.1 95.3  PLT 180 187 163 119*   Cardiac Enzymes: No results for input(s): "CKTOTAL", "CKMB", "CKMBINDEX", "TROPONINI" in the last 168 hours. BNP: Invalid input(s): "POCBNP" CBG: Recent Labs  Lab 06/18/23 1630 06/18/23 2143 06/19/23 0708 06/19/23 1111 06/19/23 1607  GLUCAP 126* 129* 94 130* 131*   HbA1C: No results for input(s): "HGBA1C" in the last 72 hours. Urine analysis:    Component Value Date/Time   COLORURINE YELLOW 06/15/2023 2123   APPEARANCEUR TURBID (A) 06/15/2023 2123   APPEARANCEUR Clear 04/18/2022 1133   LABSPEC 1.015 06/15/2023 2123   PHURINE 6.0 06/15/2023 2123   GLUCOSEU NEGATIVE 06/15/2023 2123   HGBUR SMALL (A) 06/15/2023 2123   BILIRUBINUR NEGATIVE 06/15/2023 2123   BILIRUBINUR Negative 04/18/2022 1133   KETONESUR NEGATIVE 06/15/2023 2123   PROTEINUR 100 (A) 06/15/2023 2123   UROBILINOGEN 0.2 11/17/2014 1100   NITRITE NEGATIVE 06/15/2023 2123   LEUKOCYTESUR LARGE (A) 06/15/2023 2123   Sepsis Labs: @LABRCNTIP (procalcitonin:4,lacticidven:4) ) Recent Results (from the past 240 hour(s))  Urine Culture     Status: Abnormal   Collection Time: 06/15/23  9:23 PM   Specimen: Urine, Random  Result Value Ref Range Status   Specimen  Description   Final    URINE, RANDOM Performed at [facility], [address] [place], [place] [address]    Special Requests   Final    NONE Reflexed from [id number] Performed at [facility], [address] [place], [place] [address]    Culture (A)  Final    >=100,000 COLONIES/mL ESCHERICHIA COLI Confirmed Extended Spectrum Beta-Lactamase Producer (ESBL).  In bloodstream infections from ESBL organisms, carbapenems are preferred over piperacillin/tazobactam. They are shown to have a lower risk of mortality.    Report Status 06/18/2023 FINAL  Final   Organism ID, Bacteria ESCHERICHIA COLI (A)  Final      Susceptibility   Escherichia coli - MIC*    AMPICILLIN >=32 RESISTANT Resistant     CEFAZOLIN >=64 RESISTANT Resistant     CEFEPIME >=32 RESISTANT Resistant     CEFTRIAXONE >=64 RESISTANT Resistant     CIPROFLOXACIN >=4 RESISTANT Resistant     GENTAMICIN <=1 SENSITIVE Sensitive     IMIPENEM <=0.25 SENSITIVE Sensitive     NITROFURANTOIN <=16  SENSITIVE Sensitive     TRIMETH/SULFA >=320 RESISTANT Resistant     AMPICILLIN/SULBACTAM >=32 RESISTANT Resistant     PIP/TAZO 16 SENSITIVE Sensitive ug/mL    * >=100,000 COLONIES/mL ESCHERICHIA COLI  Culture, blood (Routine x 2)     Status: None (Preliminary result)   Collection Time: 06/15/23  9:44 PM   Specimen: BLOOD LEFT ARM  Result Value Ref Range Status   Specimen Description BLOOD LEFT ARM  Final   Special Requests   Final    BOTTLES DRAWN AEROBIC AND ANAEROBIC Blood Culture adequate volume   Culture   Final    NO GROWTH 3 DAYS Performed at [facility], [address], [place], [place] [address]    Report Status PENDING  Incomplete  Culture, blood (Routine x 2)     Status: None (Preliminary result)   Collection Time: 06/15/23  9:55 PM   Specimen: Right Antecubital; Blood  Result Value Ref Range Status   Specimen Description RIGHT ANTECUBITAL  Final   Special Requests   Final    BOTTLES DRAWN AEROBIC ONLY Blood Culture adequate  volume   Culture   Final    NO GROWTH 3 DAYS Performed at [facility], [address], [place], [place] [address]    Report Status PENDING  Incomplete  Resp panel by RT-PCR (RSV, Flu A&B, Covid) Anterior Nasal Swab     Status: None   Collection Time: 06/15/23 10:15 PM   Specimen: Anterior Nasal Swab  Result Value Ref Range Status   SARS Coronavirus 2 by RT PCR NEGATIVE NEGATIVE Final    Comment: (NOTE) SARS-CoV-2 target nucleic acids are NOT DETECTED.  The SARS-CoV-2 RNA is generally detectable in upper respiratory specimens during the acute phase of infection. The lowest concentration of SARS-CoV-2 viral copies this assay can detect is 138 copies/mL. A negative result does not preclude SARS-Cov-2 infection and should not be used as the sole basis for treatment or other patient management decisions. A negative result may occur with  improper specimen collection/handling, submission of specimen other than nasopharyngeal swab, presence of viral mutation(s) within the areas targeted by this assay, and inadequate number of viral copies(<138 copies/mL). A negative result must be combined with clinical observations, patient history, and epidemiological information. The expected result is Negative.  Fact Sheet for Patients:  BloggerCourse.com  Fact Sheet for Healthcare Providers:  SeriousBroker.it  This test is no t yet approved or cleared by the [place] FDA and  has been authorized for detection and/or diagnosis of SARS-CoV-2 by FDA under an Emergency Use Authorization (EUA). This EUA will remain  in effect (meaning this test can be used) for the duration of the COVID-19 declaration under Section 564(b)(1) of the Act, 21 U.S.C.section 360bbb-3(b)(1), unless the authorization is terminated  or revoked sooner.       Influenza A by PCR NEGATIVE NEGATIVE Final   Influenza B by PCR NEGATIVE NEGATIVE Final    Comment: (NOTE) The  Xpert Xpress SARS-CoV-2/FLU/RSV plus assay is intended as an aid in the diagnosis of influenza from Nasopharyngeal swab specimens and should not be used as a sole basis for treatment. Nasal washings and aspirates are unacceptable for Xpert Xpress SARS-CoV-2/FLU/RSV testing.  Fact Sheet for Patients: BloggerCourse.com  Fact Sheet for Healthcare Providers: SeriousBroker.it  This test is not yet approved or cleared by the [place] FDA and has been authorized for detection and/or diagnosis of SARS-CoV-2 by FDA under an Emergency Use Authorization (EUA). This EUA will remain in effect (meaning  this test can be used) for the duration of the COVID-19 declaration under Section 564(b)(1) of the Act, 21 U.S.C. section 360bbb-3(b)(1), unless the authorization is terminated or revoked.     Resp Syncytial Virus by PCR NEGATIVE NEGATIVE Final    Comment: (NOTE) Fact Sheet for Patients: BloggerCourse.com  Fact Sheet for Healthcare Providers: SeriousBroker.it  This test is not yet approved or cleared by the [place] FDA and has been authorized for detection and/or diagnosis of SARS-CoV-2 by FDA under an Emergency Use Authorization (EUA). This EUA will remain in effect (meaning this test can be used) for the duration of the COVID-19 declaration under Section 564(b)(1) of the Act, 21 U.S.C. section 360bbb-3(b)(1), unless the authorization is terminated or revoked.  Performed at [facility], [address], [place], [place] [address]      Scheduled Meds:  atorvastatin  10 mg Oral Daily   clopidogrel  75 mg Oral Daily   escitalopram  20 mg Oral Daily   famotidine  20 mg Oral QHS   gabapentin  300 mg Oral QHS   Gerhardt's butt cream   Topical Daily   heparin  5,000 Units Subcutaneous Q8H   insulin aspart  0-15 Units Subcutaneous TID WC   insulin aspart  0-5 Units Subcutaneous QHS    insulin detemir  8 Units Subcutaneous Daily   levothyroxine  50 mcg Oral Daily   LORazepam  0.5 mg Oral BID   metoprolol tartrate  12.5 mg Oral q morning   Continuous Infusions:  meropenem (MERREM) IV 1 g (06/19/23 1343)    Procedures/Studies: CT HEAD WO CONTRAST ( )  Result Date: 06/16/2023 CLINICAL DATA:  Mental status change with unknown cause. EXAM: CT HEAD WITHOUT CONTRAST TECHNIQUE: Contiguous axial images were obtained from the base of the skull through the vertex without intravenous contrast. RADIATION DOSE REDUCTION: This exam was performed according to the departmental dose-optimization program which includes automated exposure control, adjustment of the mA and/or kV according to patient size and/or use of iterative reconstruction technique. COMPARISON:  Yesterday FINDINGS: Brain: Chronic perforator infarct at the left basal ganglia and deep white matter. Chronic small vessel ischemic gliosis in the cerebral white matter that is extensive. Cerebral volume loss that is generalized. No evidence of acute infarct, acute hemorrhage, hydrocephalus, mass, or collection. Vascular: No hyperdense vessel or unexpected calcification. Skull: Normal. Negative for fracture or focal lesion. Sinuses/Orbits: No acute finding. IMPRESSION: 1. No acute finding. 2. Extensive chronic small vessel ischemia. Electronically Signed   By: Tiburcio Pea M.D.   On: 06/16/2023 19:03   CT Head Wo Contrast  Result Date: 06/15/2023 CLINICAL DATA:  Mental status change, unknown cause. History of UTI and encephalopathy. EXAM: CT HEAD WITHOUT CONTRAST TECHNIQUE: Contiguous axial images were obtained from the base of the skull through the vertex without intravenous contrast. RADIATION DOSE REDUCTION: This exam was performed according to the departmental dose-optimization program which includes automated exposure control, adjustment of the mA and/or kV according to patient size and/or use of iterative reconstruction  technique. COMPARISON:  06/04/2023. FINDINGS: Brain: No acute intracranial hemorrhage, midline shift or mass effect. No extra-axial fluid collection. Diffuse atrophy is noted. Extensive subcortical and periventricular white matter hypodensities are present bilaterally. An old infarct is noted in the basal ganglia on the left. Vascular: No hyperdense vessel or unexpected calcification. Skull: Normal. Negative for fracture or focal lesion. Sinuses/Orbits: No acute finding. Other: None. IMPRESSION: 1. No acute intracranial process. 2. Stable atrophy with extensive chronic microvascular ischemic changes and  old infarct. Electronically Signed   By: Thornell Sartorius M.D.   On: 06/15/2023 22:40   DG Chest Portable 1 View  Result Date: 06/15/2023 CLINICAL DATA:  Sepsis EXAM: PORTABLE CHEST 1 VIEW COMPARISON:  Chest x-ray 06/04/2023 FINDINGS: Loop recorder overlies the chest. The heart is enlarged. The lungs are clear. There is no pleural effusion or pneumothorax. No acute fractures are seen. IMPRESSION: Cardiomegaly. No acute cardiopulmonary process. Electronically Signed   By: Darliss Cheney M.D.   On: 06/15/2023 22:04   CT HEAD WO CONTRAST ( )  Result Date: 06/04/2023 CLINICAL DATA:  Mental status change of unknown cause EXAM: CT HEAD WITHOUT CONTRAST TECHNIQUE: Contiguous axial images were obtained from the base of the skull through the vertex without intravenous contrast. RADIATION DOSE REDUCTION: This exam was performed according to the departmental dose-optimization program which includes automated exposure control, adjustment of the mA and/or kV according to patient size and/or use of iterative reconstruction technique. COMPARISON:  02/21/2019 FINDINGS: Brain: Age related volume loss. Chronic small-vessel ischemic changes affect the pons. No focal cerebellar insult. Generalized atrophy of the cerebral hemispheres. Chronic small-vessel ischemic changes throughout the white matter. Old left basal  ganglia/radiating white matter tract infarction. Old subcortical infarction at the right parietal vertex. No sign of acute infarction, mass lesion, hemorrhage, hydrocephalus or extra-axial collection. Vascular: There is atherosclerotic calcification of the major vessels at the base of the brain. Skull: Negative Sinuses/Orbits: Clear/normal Other: None IMPRESSION: No acute finding by CT. Atrophy and chronic small-vessel ischemic changes as outlined above. Old left basal ganglia/radiating white matter tract infarction. Old subcortical infarction at the right parietal vertex. Electronically Signed   By: Paulina Fusi M.D.   On: 06/04/2023 18:17   DG Chest Port 1 View  Result Date: 06/04/2023 CLINICAL DATA:  Weakness EXAM: PORTABLE CHEST 1 VIEW COMPARISON:  XR 09/09/20. FINDINGS: Film is rotated to the right. Underinflation. No consolidation, pneumothorax or effusion. No edema. Normal cardiopericardial silhouette. Old right-sided rib fractures. Loop recorder overlying the midthorax. Calcified aorta. Overlapping cardiac leads. IMPRESSION: Rotated radiograph. Loop recorder. No acute cardiopulmonary disease. Electronically Signed   By: Karen Kays M.D.   On: 06/04/2023 15:19    Catarina Hartshorn, DO  [facility]  If 7PM-7AM, please contact night-coverage [email or web address] Password [email or web address] 06/19/2023, 5:55 PM   LOS: 4 days

## 2023-06-19 NOTE — Plan of Care (Signed)
  Problem: Metabolic: Goal: Ability to maintain appropriate glucose levels will improve Outcome: Not Progressing   Problem: Health Behavior/Discharge Planning: Goal: Ability to manage health-related needs will improve Outcome: Not Progressing   Problem: Skin Integrity: Goal: Risk for impaired skin integrity will decrease Outcome: Not Progressing   Problem: Clinical Measurements: Goal: Will remain free from infection Outcome: Not Progressing

## 2023-06-19 NOTE — Evaluation (Signed)
Physical Therapy Evaluation Patient Details Name: Dawn Ho MRN: [id number] DOB: November 08, 1950 Today's Date: 06/19/2023  History of Present Illness  Per NW:GNFA Dawn Ho is a 72 y.o. female with medical history significant of CVA, depression with anxiety, diabetes mellitus type 2, dyslipidemia, fibromyalgia, hypertension, stroke, and more presents the ED with a chief complaint of altered mental status.  Patient has a history of dementia, but her altered mental status has been described as decreased p.o. intake.  She was given Rocephin with EMS.  She was reported to be somnolent more than baseline.  She has not been agitated in the ED.  She is not able to provide me any history as she is hypersomnolent.  She opens her eyes looks you in the eye moans, and then goes back to sleep.  When I asked her if she was in any pain she did not answer at all.  She did answer to a couple questions just one-word yes even if they were open ended questions.  But she did not give that answer to pain which is a good sign.  Her nursing home papers were reviewed  Clinical Impression  Pt will not assist in moving joints or bed mobility.  Therapist does not feel she is a good candidate for skilled physical therapy at this time.         If plan is discharge home, recommend the following: A lot of help with walking and/or transfers;A lot of help with bathing/dressing/bathroom;Help with stairs or ramp for entrance;Assistance with cooking/housework   Can travel by private vehicle        Equipment Recommendations None recommended by PT  Recommendations for Other Services       Functional Status Assessment Patient has had a recent decline in their functional status and/or demonstrates limited ability to make significant improvements in function in a reasonable and predictable amount of time     Precautions / Restrictions Precautions Precautions: Fall Restrictions Weight Bearing Restrictions: No      Mobility  Bed  Mobility Overal bed mobility: Needs Assistance Bed Mobility: Supine to Sit, Sit to Supine     Supine to sit: Total assist Sit to supine: Total assist                                                                                       Pertinent Vitals/Pain Pain Assessment Pain Assessment: Faces Faces Pain Scale: Hurts little more (with attempt of moving limbs.)    Home Living Family/patient expects to be discharged to:: Skilled nursing facility                        Prior Function Prior Level of Function : Needs assist       Physical Assist : Mobility (physical);ADLs (physical) Mobility (physical): Bed mobility;Gait;Transfers;Stairs   Mobility Comments: Assisted transfers, uses w/c for mobility ADLs Comments: Assisted by SNF staff     Extremity/Trunk Assessment   Upper Extremity Assessment Upper Extremity Assessment: Generalized weakness (Pt does not assist with movement of joins at all)    Lower Extremity Assessment Lower Extremity Assessment: Generalized weakness (Pt does not assist with  movement of joints at all)       Communication   Communication Communication: Other (comment) (hx of dementia) Cueing Techniques: Verbal cues;Tactile cues  Cognition Arousal: Alert Behavior During Therapy: Flat affect Overall Cognitive Status: History of cognitive impairments - at baseline                                                     Assessment/Plan    PT Assessment Patient does not need any further PT services  PT Problem List Decreased strength;Decreased activity tolerance;Decreased balance;Decreased mobility                      AM-PAC PT "6 Clicks" Mobility  Outcome Measure Help needed turning from your back to your side while in a flat bed without using bedrails?: Total Help needed moving from lying on your back to sitting on the side of a flat bed without using bedrails?:  Total Help needed moving to and from a bed to a chair (including a wheelchair)?: Total Help needed standing up from a chair using your arms (e.g., wheelchair or bedside chair)?: Total Help needed to walk in hospital room?: Total Help needed climbing 3-5 steps with a railing? : Total 6 Click Score: 6    End of Session   Activity Tolerance: Patient limited by pain Patient left: in bed;with call bell/phone within reach Nurse Communication: Mobility status;Need for lift equipment PT Visit Diagnosis: Muscle weakness (generalized) (M62.81);Other abnormalities of gait and mobility (R26.89)    Time: 1020-1035 PT Time Calculation (min) (ACUTE ONLY): 15 min   Charges:   PT Evaluation $PT Eval Low Complexity: 1 Low   PT General Charges $$ ACUTE PT VISIT: 1 Visit    Virgina Organ, PT CLT [phone]  06/19/2023, 10:37 AM

## 2023-06-19 NOTE — Hospital Course (Signed)
72 y.o. female with a history of CVA with left hemiparesis, T2DM, HLD, HTN, depression/anxiety,  fibromyalgia, bilateral staghorn calculi, and right femoral fracture status post IM nail 08/2020 presents from [facility] and recent admission for Enterococcus UTI (discharged to [facility] 10/20) who presented to the ED on 06/15/2023 due to AMS, decreased per oral intake. She was found to be febrile (102.47F), tachycardic, with leukocytosis (WBC 14.6k) with significant pyuria and hypernatremia (Na 150). Neuroimaging with severe chronic changes but no acute abnormalities. She was admitted for acute metabolic encephalopathy due to sepsis due to UTI, continued on linezolid. Course of linezolid has been completed. ESBL E. coli grew on admission urine culture so meropenem is started 10/29. Pt's hypernatremia has resolved with IVF, though has not taken anything by mouth since arrival. Palliative care consulted for assistance with goals of care discussions.

## 2023-06-19 NOTE — Progress Notes (Signed)
Iv infiltrated and removed notified Dawn Ho of [facility], to do alternating warm and cold  compress for now.  ICU called to do insert new line  using ultrasound as patient doesn't have any visible vein for new line

## 2023-06-19 NOTE — Plan of Care (Signed)
  Problem: Education: Goal: Ability to describe self-care measures that may prevent or decrease complications (Diabetes Survival Skills Education) will improve Outcome: Not Met (add Reason)   Problem: Coping: Goal: Ability to adjust to condition or change in health will improve Outcome: Not Met (add Reason)   Problem: Fluid Volume: Goal: Ability to maintain a balanced intake and output will improve Outcome: Progressing   Problem: Health Behavior/Discharge Planning: Goal: Ability to manage health-related needs will improve Outcome: Not Met (add Reason)   Problem: Metabolic: Goal: Ability to maintain appropriate glucose levels will improve Outcome: Progressing   Problem: Skin Integrity: Goal: Risk for impaired skin integrity will decrease Outcome: Not Met (add Reason)

## 2023-06-20 DIAGNOSIS — E1165 Type 2 diabetes mellitus with hyperglycemia: Secondary | ICD-10-CM | POA: Diagnosis not present

## 2023-06-20 DIAGNOSIS — G9341 Metabolic encephalopathy: Secondary | ICD-10-CM | POA: Diagnosis not present

## 2023-06-20 DIAGNOSIS — Z794 Long term (current) use of insulin: Secondary | ICD-10-CM | POA: Diagnosis not present

## 2023-06-20 DIAGNOSIS — Z7189 Other specified counseling: Secondary | ICD-10-CM | POA: Diagnosis not present

## 2023-06-20 DIAGNOSIS — Z515 Encounter for palliative care: Secondary | ICD-10-CM | POA: Diagnosis not present

## 2023-06-20 DIAGNOSIS — F0390 Unspecified dementia without behavioral disturbance: Secondary | ICD-10-CM | POA: Diagnosis not present

## 2023-06-20 DIAGNOSIS — A4151 Sepsis due to Escherichia coli [E. coli]: Secondary | ICD-10-CM | POA: Diagnosis not present

## 2023-06-20 LAB — GLUCOSE, CAPILLARY
Glucose-Capillary: 130 mg/dL — ABNORMAL HIGH (ref 70–99)
Glucose-Capillary: 111 mg/dL — ABNORMAL HIGH (ref 70–99)
Glucose-Capillary: 97 mg/dL (ref 70–99)
Glucose-Capillary: 106 mg/dL — ABNORMAL HIGH (ref 70–99)

## 2023-06-20 LAB — BASIC METABOLIC PANEL
Anion gap: 9 (ref 5–15)
BUN: 13 mg/dL (ref 8–23)
CO2: 20 mmol/L — ABNORMAL LOW (ref 22–32)
Calcium: 8.5 mg/dL — ABNORMAL LOW (ref 8.9–10.3)
Chloride: 112 mmol/L — ABNORMAL HIGH (ref 98–111)
Creatinine, Ser: 0.72 mg/dL (ref 0.44–1.00)
GFR, Estimated: >60 mL/min (ref >60)
Glucose, Bld: 135 mg/dL — ABNORMAL HIGH (ref 70–99)
Potassium: 3.7 mmol/L (ref 3.5–5.1)
Sodium: 141 mmol/L (ref 135–145)

## 2023-06-20 LAB — CULTURE, BLOOD (ROUTINE X 2)
Culture: NO GROWTH
Culture: NO GROWTH
Special Requests: ADEQUATE
Special Requests: ADEQUATE

## 2023-06-20 LAB — CBC
HCT: 33.4 % — ABNORMAL LOW (ref 36.0–46.0)
Hemoglobin: 10.2 g/dL — ABNORMAL LOW (ref 12.0–15.0)
MCH: 29.4 pg (ref 26.0–34.0)
MCHC: 30.5 g/dL (ref 30.0–36.0)
MCV: 96.3 fL (ref 80.0–100.0)
Platelets: 106 10*3/uL — ABNORMAL LOW (ref 150–400)
RBC: 3.47 MIL/uL — ABNORMAL LOW (ref 3.87–5.11)
RDW: 14.2 % (ref 11.5–15.5)
WBC: 7.4 10*3/uL (ref 4.0–10.5)
nRBC: 0.3 % — ABNORMAL HIGH (ref 0.0–0.2)

## 2023-06-20 LAB — MAGNESIUM: Magnesium: 2 mg/dL (ref 1.7–2.4)

## 2023-06-20 NOTE — Progress Notes (Signed)
PROGRESS NOTE  Dawn Ho GMW:[id number] DOB: 04/27/1951 DOA: 06/15/2023 PCP: Bernerd Limbo, MD  Brief History:   72 y.o. female with a history of CVA with left hemiparesis, T2DM, HLD, HTN, depression/anxiety,  fibromyalgia, bilateral staghorn calculi, and right femoral fracture status post IM nail 08/2020 presents from [facility] and recent admission for Enterococcus UTI (discharged to [facility] 10/20) who presented to the ED on 06/15/2023 due to AMS, decreased per oral intake. She was found to be febrile (102.45F), tachycardic, with leukocytosis (WBC 14.6k) with significant pyuria and hypernatremia (Na 150). Neuroimaging with severe chronic changes but no acute abnormalities. She was admitted for acute metabolic encephalopathy due to sepsis due to UTI, continued on linezolid. Course of linezolid has been completed. ESBL E. coli grew on admission urine culture so meropenem is started 10/29. Pt's hypernatremia has resolved with IVF, though has not taken anything by mouth since arrival. Palliative care consulted for assistance with goals of care discussions.     Assessment/Plan: Sepsis -Present on admission -Presented with leukocytosis, tachycardia and fever up to 102.1 F. -Secondary to UTI -UA > 50 WBC -06/04/2023 blood cultures negative -Lactic acid 1.3>> 1.1 -Chest x-ray--no infiltrates -sepsis physiology resolved   UTI -Urine culture showed E coli--ESBL -merrem started 10/29 -blood cultures neg   Acute metabolic encephalopathy -Multifactorial including sepsis/UTI, hypernatremia, AKI -Ammonia 26 -B12 1048 -TSH 1.769 -06/16/2023 CT brain negative -overall improved, but son states pt has had gradual cognitive decline worsened by recent hospitalizations   AKI -baseline 0.6-0.7 -presented with serum creatinine 1.07   Hypernatremia -Presented with sodium 150 -Restarted hypotonic fluid -Hold furosemide--do not plan to restart as she has poor po intake at  baseline   Uncontrolled diabetes mellitus type 2 with hyperglycemia -06/04/2023 hemoglobin A1c 8.3 -Continue Semglee 8 units -Continue NovoLog sliding scale   Essential hypertension -holding metoprolol secondary to soft blood pressure   Metabolic acidosis -Presented with bicarb 19   Personal history of PCN allergy -pt is able to tolerate carbapenems   HLD:  - Continue statin   Depression, anxiety:  - Continue home Tx including SSRI, lorazepam.  -PDMP reviewed--lorazepam 1 mg, #60 filled 06/11/23             Family Communication: son updated 10/31   Consultants:  none   Code Status:  FULL    DVT Prophylaxis:  Watts Heparin     Procedures: As Listed in Progress Note Above   Antibiotics: Merrem 10/29>>             Subjective: Pt is pleasantly confused.  Denies f/c, cp, n/v/d, abd pain, sob  Objective: Vitals:   06/19/23 1319 06/19/23 2014 06/20/23 0925 06/20/23 1218  BP: 98/75 (!) 107/48 (!) 117/48 (!) 96/49  Pulse: 75 76 68 67  Resp: 16 19  18   Temp: 98 F (36.7 C) (!) 97.1 F (36.2 C)  97.6 F (36.4 C)  TempSrc: Oral Oral  Axillary  SpO2: 97% 100%  98%  Weight:      Height:        Intake/Output Summary (Last 24 hours) at 06/20/2023 1713 Last data filed at 06/20/2023 1239 Gross per 24 hour  Intake 240 ml  Output --  Net 240 ml   Weight change:  Exam:  General:  Pt is alert, follows commands appropriately, not in acute distress HEENT: No icterus, No thrush, No neck mass, [place]/AT Cardiovascular: RRR, S1/S2, no rubs, no gallops Respiratory: bibasilar crackles.  No wheeze Abdomen: Soft/+BS, non tender, non distended, no guarding Extremities: No edema, No lymphangitis, No petechiae, No rashes, no synovitis   Data Reviewed: I have personally reviewed following labs and imaging studies Basic Metabolic Panel: Recent Labs  Lab 06/16/23 0605 06/16/23 1402 06/17/23 0456 06/18/23 0826 06/19/23 1213 06/20/23 0507  NA 149* 145 147* 141  141 141  K 4.1 4.2 3.5 3.6 4.6 3.7  CL 117* 114* 115* 112* 113* 112*  CO2 22 19* 25 22 21* 20*  GLUCOSE 342* 292* 157* 146* 144* 135*  BUN 29* 25* 23 19 14 13   CREATININE 0.98 0.95 0.78 0.68 0.75 0.72  CALCIUM 8.6* 8.2* 8.4* 8.2* 8.4* 8.5*  MG 2.1  --   --   --   --  2.0   Liver Function Tests: Recent Labs  Lab 06/15/23 2144 06/16/23 0605  AST 20 23  ALT 34 33  ALKPHOS 68 64  BILITOT 0.7 0.6  PROT 6.3* 6.0*  ALBUMIN 2.9* 2.6*   No results for input(s): "LIPASE", "AMYLASE" in the last 168 hours. Recent Labs  Lab 06/15/23 2144  AMMONIA 17   Coagulation Profile: Recent Labs  Lab 06/15/23 2144  INR 1.0   CBC: Recent Labs  Lab 06/15/23 2144 06/16/23 0605 06/17/23 0456 06/19/23 1213 06/20/23 0507  WBC 14.6* 12.3* 11.4* 8.7 7.4  NEUTROABS 9.5* 8.8*  --   --   --   HGB 11.9* 11.7* 10.2* 9.7* 10.2*  HCT 38.7 37.9 33.7* 30.7* 33.4*  MCV 97.2 96.9 97.1 95.3 96.3  PLT 180 187 163 119* 106*   Cardiac Enzymes: No results for input(s): "CKTOTAL", "CKMB", "CKMBINDEX", "TROPONINI" in the last 168 hours. BNP: Invalid input(s): "POCBNP" CBG: Recent Labs  Lab 06/19/23 1607 06/19/23 2226 06/20/23 0734 06/20/23 1127 06/20/23 1601  GLUCAP 131* 177* 130* 111* 97   HbA1C: No results for input(s): "HGBA1C" in the last 72 hours. Urine analysis:    Component Value Date/Time   COLORURINE YELLOW 06/15/2023 2123   APPEARANCEUR TURBID (A) 06/15/2023 2123   APPEARANCEUR Clear 04/18/2022 1133   LABSPEC 1.015 06/15/2023 2123   PHURINE 6.0 06/15/2023 2123   GLUCOSEU NEGATIVE 06/15/2023 2123   HGBUR SMALL (A) 06/15/2023 2123   BILIRUBINUR NEGATIVE 06/15/2023 2123   BILIRUBINUR Negative 04/18/2022 1133   KETONESUR NEGATIVE 06/15/2023 2123   PROTEINUR 100 (A) 06/15/2023 2123   UROBILINOGEN 0.2 11/17/2014 1100   NITRITE NEGATIVE 06/15/2023 2123   LEUKOCYTESUR LARGE (A) 06/15/2023 2123   Sepsis Labs: @LABRCNTIP (procalcitonin:4,lacticidven:4) ) Recent Results (from the  past 240 hour(s))  Urine Culture     Status: Abnormal   Collection Time: 06/15/23  9:23 PM   Specimen: Urine, Random  Result Value Ref Range Status   Specimen Description   Final    URINE, RANDOM Performed at [facility], [address] [place], [place] [address]    Special Requests   Final    NONE Reflexed from [id number] Performed at [facility], [address] [place], [place] [address]    Culture (A)  Final    >=100,000 COLONIES/mL ESCHERICHIA COLI Confirmed Extended Spectrum Beta-Lactamase Producer (ESBL).  In bloodstream infections from ESBL organisms, carbapenems are preferred over piperacillin/tazobactam. They are shown to have a lower risk of mortality.    Report Status 06/18/2023 FINAL  Final   Organism ID, Bacteria ESCHERICHIA COLI (A)  Final      Susceptibility   Escherichia coli - MIC*    AMPICILLIN >=32 RESISTANT Resistant     CEFAZOLIN >=64  RESISTANT Resistant     CEFEPIME >=32 RESISTANT Resistant     CEFTRIAXONE >=64 RESISTANT Resistant     CIPROFLOXACIN >=4 RESISTANT Resistant     GENTAMICIN <=1 SENSITIVE Sensitive     IMIPENEM <=0.25 SENSITIVE Sensitive     NITROFURANTOIN <=16 SENSITIVE Sensitive     TRIMETH/SULFA >=320 RESISTANT Resistant     AMPICILLIN/SULBACTAM >=32 RESISTANT Resistant     PIP/TAZO 16 SENSITIVE Sensitive ug/mL    * >=100,000 COLONIES/mL ESCHERICHIA COLI  Culture, blood (Routine x 2)     Status: None   Collection Time: 06/15/23  9:44 PM   Specimen: BLOOD LEFT ARM  Result Value Ref Range Status   Specimen Description   Final    BLOOD LEFT ARM Performed at [facility], [address], [place], [place] [address]    Special Requests   Final    BOTTLES DRAWN AEROBIC AND ANAEROBIC Blood Culture adequate volume Performed at [facility], [address] [place], [place] [address]    Culture   Final    NO GROWTH 5 DAYS Performed at [facility], [address], [place], [place] [address]    Report Status 06/20/2023 FINAL  Final   Culture, blood (Routine x 2)     Status: None   Collection Time: 06/15/23  9:55 PM   Specimen: Right Antecubital; Blood  Result Value Ref Range Status   Specimen Description   Final    RIGHT ANTECUBITAL Performed at [facility], [address], [place], [place] [address]    Special Requests   Final    BOTTLES DRAWN AEROBIC ONLY Blood Culture adequate volume Performed at [facility], [address] [place], [place] [address]    Culture   Final    NO GROWTH 5 DAYS Performed at [facility], [address] [place], [place] [address]    Report Status 06/20/2023 FINAL  Final  Resp panel by RT-PCR (RSV, Flu A&B, Covid) Anterior Nasal Swab     Status: None   Collection Time: 06/15/23 10:15 PM   Specimen: Anterior Nasal Swab  Result Value Ref Range Status   SARS Coronavirus 2 by RT PCR NEGATIVE NEGATIVE Final    Comment: (NOTE) SARS-CoV-2 target nucleic acids are NOT DETECTED.  The SARS-CoV-2 RNA is generally detectable in upper respiratory specimens during the acute phase of infection. The lowest concentration of SARS-CoV-2 viral copies this assay can detect is 138 copies/mL. A negative result does not preclude SARS-Cov-2 infection and should not be used as the sole basis for treatment or other patient management decisions. A negative result may occur with  improper specimen collection/handling, submission of specimen other than nasopharyngeal swab, presence of viral mutation(s) within the areas targeted by this assay, and inadequate number of viral copies(<138 copies/mL). A negative result must be combined with clinical observations, patient history, and epidemiological information. The expected result is Negative.  Fact Sheet for Patients:  BloggerCourse.com  Fact Sheet for Healthcare Providers:  SeriousBroker.it  This test is no t yet approved or cleared by the [place] FDA and  has been authorized for detection  and/or diagnosis of SARS-CoV-2 by FDA under an Emergency Use Authorization (EUA). This EUA will remain  in effect (meaning this test can be used) for the duration of the COVID-19 declaration under Section 564(b)(1) of the Act, 21 U.S.C.section 360bbb-3(b)(1), unless the authorization is terminated  or revoked sooner.       Influenza A by PCR NEGATIVE NEGATIVE Final  Influenza B by PCR NEGATIVE NEGATIVE Final    Comment: (NOTE) The Xpert Xpress SARS-CoV-2/FLU/RSV plus assay is intended as an aid in the diagnosis of influenza from Nasopharyngeal swab specimens and should not be used as a sole basis for treatment. Nasal washings and aspirates are unacceptable for Xpert Xpress SARS-CoV-2/FLU/RSV testing.  Fact Sheet for Patients: BloggerCourse.com  Fact Sheet for Healthcare Providers: SeriousBroker.it  This test is not yet approved or cleared by the [place] FDA and has been authorized for detection and/or diagnosis of SARS-CoV-2 by FDA under an Emergency Use Authorization (EUA). This EUA will remain in effect (meaning this test can be used) for the duration of the COVID-19 declaration under Section 564(b)(1) of the Act, 21 U.S.C. section 360bbb-3(b)(1), unless the authorization is terminated or revoked.     Resp Syncytial Virus by PCR NEGATIVE NEGATIVE Final    Comment: (NOTE) Fact Sheet for Patients: BloggerCourse.com  Fact Sheet for Healthcare Providers: SeriousBroker.it  This test is not yet approved or cleared by the [place] FDA and has been authorized for detection and/or diagnosis of SARS-CoV-2 by FDA under an Emergency Use Authorization (EUA). This EUA will remain in effect (meaning this test can be used) for the duration of the COVID-19 declaration under Section 564(b)(1) of the Act, 21 U.S.C. section 360bbb-3(b)(1), unless the authorization is terminated  or revoked.  Performed at [facility], [address], [place], [place] [address]      Scheduled Meds:  atorvastatin  10 mg Oral Daily   clopidogrel  75 mg Oral Daily   escitalopram  20 mg Oral Daily   famotidine  20 mg Oral QHS   gabapentin  300 mg Oral QHS   Gerhardt's butt cream   Topical Daily   heparin  5,000 Units Subcutaneous Q8H   insulin aspart  0-15 Units Subcutaneous TID WC   insulin aspart  0-5 Units Subcutaneous QHS   insulin detemir  8 Units Subcutaneous Daily   levothyroxine  50 mcg Oral Daily   LORazepam  0.5 mg Oral BID   metoprolol tartrate  12.5 mg Oral q morning   Continuous Infusions:  meropenem (MERREM) IV 1 g (06/20/23 1503)    Procedures/Studies: CT HEAD WO CONTRAST ( )  Result Date: 06/16/2023 CLINICAL DATA:  Mental status change with unknown cause. EXAM: CT HEAD WITHOUT CONTRAST TECHNIQUE: Contiguous axial images were obtained from the base of the skull through the vertex without intravenous contrast. RADIATION DOSE REDUCTION: This exam was performed according to the departmental dose-optimization program which includes automated exposure control, adjustment of the mA and/or kV according to patient size and/or use of iterative reconstruction technique. COMPARISON:  Yesterday FINDINGS: Brain: Chronic perforator infarct at the left basal ganglia and deep white matter. Chronic small vessel ischemic gliosis in the cerebral white matter that is extensive. Cerebral volume loss that is generalized. No evidence of acute infarct, acute hemorrhage, hydrocephalus, mass, or collection. Vascular: No hyperdense vessel or unexpected calcification. Skull: Normal. Negative for fracture or focal lesion. Sinuses/Orbits: No acute finding. IMPRESSION: 1. No acute finding. 2. Extensive chronic small vessel ischemia. Electronically Signed   By: Tiburcio Pea M.D.   On: 06/16/2023 19:03   CT Head Wo Contrast  Result Date: 06/15/2023 CLINICAL DATA:  Mental status change,  unknown cause. History of UTI and encephalopathy. EXAM: CT HEAD WITHOUT CONTRAST TECHNIQUE: Contiguous axial images were obtained from the base of the skull through the vertex without intravenous contrast. RADIATION DOSE REDUCTION: This exam was performed according to  the departmental dose-optimization program which includes automated exposure control, adjustment of the mA and/or kV according to patient size and/or use of iterative reconstruction technique. COMPARISON:  06/04/2023. FINDINGS: Brain: No acute intracranial hemorrhage, midline shift or mass effect. No extra-axial fluid collection. Diffuse atrophy is noted. Extensive subcortical and periventricular white matter hypodensities are present bilaterally. An old infarct is noted in the basal ganglia on the left. Vascular: No hyperdense vessel or unexpected calcification. Skull: Normal. Negative for fracture or focal lesion. Sinuses/Orbits: No acute finding. Other: None. IMPRESSION: 1. No acute intracranial process. 2. Stable atrophy with extensive chronic microvascular ischemic changes and old infarct. Electronically Signed   By: Thornell Sartorius M.D.   On: 06/15/2023 22:40   DG Chest Portable 1 View  Result Date: 06/15/2023 CLINICAL DATA:  Sepsis EXAM: PORTABLE CHEST 1 VIEW COMPARISON:  Chest x-ray 06/04/2023 FINDINGS: Loop recorder overlies the chest. The heart is enlarged. The lungs are clear. There is no pleural effusion or pneumothorax. No acute fractures are seen. IMPRESSION: Cardiomegaly. No acute cardiopulmonary process. Electronically Signed   By: Darliss Cheney M.D.   On: 06/15/2023 22:04   CT HEAD WO CONTRAST ( )  Result Date: 06/04/2023 CLINICAL DATA:  Mental status change of unknown cause EXAM: CT HEAD WITHOUT CONTRAST TECHNIQUE: Contiguous axial images were obtained from the base of the skull through the vertex without intravenous contrast. RADIATION DOSE REDUCTION: This exam was performed according to the departmental dose-optimization  program which includes automated exposure control, adjustment of the mA and/or kV according to patient size and/or use of iterative reconstruction technique. COMPARISON:  02/21/2019 FINDINGS: Brain: Age related volume loss. Chronic small-vessel ischemic changes affect the pons. No focal cerebellar insult. Generalized atrophy of the cerebral hemispheres. Chronic small-vessel ischemic changes throughout the white matter. Old left basal ganglia/radiating white matter tract infarction. Old subcortical infarction at the right parietal vertex. No sign of acute infarction, mass lesion, hemorrhage, hydrocephalus or extra-axial collection. Vascular: There is atherosclerotic calcification of the major vessels at the base of the brain. Skull: Negative Sinuses/Orbits: Clear/normal Other: None IMPRESSION: No acute finding by CT. Atrophy and chronic small-vessel ischemic changes as outlined above. Old left basal ganglia/radiating white matter tract infarction. Old subcortical infarction at the right parietal vertex. Electronically Signed   By: Paulina Fusi M.D.   On: 06/04/2023 18:17   DG Chest Port 1 View  Result Date: 06/04/2023 CLINICAL DATA:  Weakness EXAM: PORTABLE CHEST 1 VIEW COMPARISON:  XR 09/09/20. FINDINGS: Film is rotated to the right. Underinflation. No consolidation, pneumothorax or effusion. No edema. Normal cardiopericardial silhouette. Old right-sided rib fractures. Loop recorder overlying the midthorax. Calcified aorta. Overlapping cardiac leads. IMPRESSION: Rotated radiograph. Loop recorder. No acute cardiopulmonary disease. Electronically Signed   By: Karen Kays M.D.   On: 06/04/2023 15:19    Catarina Hartshorn, DO  [facility]  If 7PM-7AM, please contact night-coverage [email or web address] Password [email or web address] 06/20/2023, 5:13 PM   LOS: 5 days

## 2023-06-21 DIAGNOSIS — E1165 Type 2 diabetes mellitus with hyperglycemia: Secondary | ICD-10-CM | POA: Diagnosis not present

## 2023-06-21 DIAGNOSIS — E870 Hyperosmolality and hypernatremia: Secondary | ICD-10-CM | POA: Diagnosis not present

## 2023-06-21 DIAGNOSIS — N390 Urinary tract infection, site not specified: Secondary | ICD-10-CM

## 2023-06-21 DIAGNOSIS — G9341 Metabolic encephalopathy: Secondary | ICD-10-CM | POA: Diagnosis not present

## 2023-06-21 LAB — TSH: TSH: 2.636 u[IU]/mL (ref 0.350–4.500)

## 2023-06-21 LAB — GLUCOSE, CAPILLARY
Glucose-Capillary: 107 mg/dL — ABNORMAL HIGH (ref 70–99)
Glucose-Capillary: 127 mg/dL — ABNORMAL HIGH (ref 70–99)
Glucose-Capillary: 171 mg/dL — ABNORMAL HIGH (ref 70–99)

## 2023-06-21 LAB — BASIC METABOLIC PANEL
Anion gap: 8 (ref 5–15)
BUN: 10 mg/dL (ref 8–23)
CO2: 21 mmol/L — ABNORMAL LOW (ref 22–32)
Calcium: 8.7 mg/dL — ABNORMAL LOW (ref 8.9–10.3)
Chloride: 110 mmol/L (ref 98–111)
Creatinine, Ser: 0.71 mg/dL (ref 0.44–1.00)
GFR, Estimated: >60 mL/min (ref >60)
Glucose, Bld: 101 mg/dL — ABNORMAL HIGH (ref 70–99)
Potassium: 3.6 mmol/L (ref 3.5–5.1)
Sodium: 139 mmol/L (ref 135–145)

## 2023-06-21 LAB — T4, FREE: Free T4: 1.08 ng/dL (ref 0.61–1.12)

## 2023-06-21 LAB — AMMONIA: Ammonia: 23 umol/L (ref 9–35)

## 2023-06-21 LAB — VITAMIN B12: Vitamin B-12: 994 pg/mL — ABNORMAL HIGH (ref 180–914)

## 2023-06-21 LAB — FOLATE: Folate: 21.6 ng/mL (ref >5.9)

## 2023-06-21 MED ORDER — GERHARDT'S BUTT CREAM: 1.0000 | TOPICAL_CREAM | Freq: Every day | CUTANEOUS | Status: DC

## 2023-06-21 MED ORDER — FOSFOMYCIN TROMETHAMINE 3 G PO PACK: 3.0000 g | PACK | Freq: Once | ORAL | Status: AC

## 2023-06-21 NOTE — Progress Notes (Signed)
Patient d/c back to [facility], transported by [facility]. Report called to nurse Asher Muir @ [facility] discharge paper work sent with paitent. Family aware of plan of care.  Malakye Nolden, Kae Heller, RN

## 2023-06-21 NOTE — Progress Notes (Signed)
OT Cancellation Note  Patient Details Name: [name] MRN: [id number] DOB: 03-23-1951   Cancelled Treatment:    Reason Eval/Treat Not Completed: OT screened, no needs identified, will sign off  OT attempted to complete evaluation, but pt was unable to participate. Ot attempted to arouse pt but she was unable to follow commands such as "squeeze my hand". Pt making audible moans but does not answer questions. Pt is not appropriate for OT services at this time due to inability to follow commands and inability to participate in therapy. Recommend pt d/c to long term care facility due to needs.    Bevelyn Ngo, OTR/L  06/21/2023, 9:27 AM

## 2023-06-21 NOTE — TOC Transition Note (Signed)
Transition of Care [facility]) - CM/SW Discharge Note   Patient Details  Name: Dawn Ho MRN: [id number] Date of Birth: Jun 03, 1951  [facility] CM/SW Contact:  Villa Herb, LCSWA Phone Number: 06/21/2023, 3:43 PM   Clinical Narrative:    CSW updated that pt is medically stable for D/C back to facility. CSW spoke to [place] with [facility] who states they can accept back. CSW updated that EMS may be late as they do not have many trucks running today, [facility] confirms this is okay. CSW completed med necessity and sent it to floor for RN. CSW called for EMS transport at 3:30pm. TOC signing off.   Final next level of care: Long Term Nursing Home Barriers to Discharge: Barriers Resolved   Patient Goals and CMS Choice CMS Medicare.gov Compare Post Acute Care list provided to:: Patient Choice offered to / list presented to : Patient  Discharge Placement                         Discharge Plan and Services Additional resources added to the After Visit Summary for   In-house Referral: Clinical Social Work Discharge Planning Services: CM Consult Post Acute Care Choice: Skilled Nursing Facility                               Social Determinants of Health (SDOH) Interventions SDOH Screenings   Food Insecurity: Patient Unable To Answer (06/04/2023)  Housing: Patient Unable To Answer (06/04/2023)  Transportation Needs: Patient Unable To Answer (06/04/2023)  Utilities: Patient Unable To Answer (06/04/2023)  Tobacco Use: Low Risk  (06/15/2023)     Readmission Risk Interventions    06/17/2023    3:42 PM  Readmission Risk Prevention Plan  Transportation Screening Complete  Home Care Screening Complete  Medication Review (RN CM) Complete

## 2023-06-21 NOTE — Plan of Care (Signed)
Problem: Education: Goal: Ability to describe self-care measures that may prevent or decrease complications (Diabetes Survival Skills Education) will improve Outcome: Adequate for Discharge Goal: Individualized Educational Video(s) Outcome: Adequate for Discharge   Problem: Coping: Goal: Ability to adjust to condition or change in health will improve Outcome: Adequate for Discharge   Problem: Fluid Volume: Goal: Ability to maintain a balanced intake and output will improve Outcome: Adequate for Discharge   Problem: Health Behavior/Discharge Planning: Goal: Ability to identify and utilize available resources and services will improve Outcome: Adequate for Discharge Goal: Ability to manage health-related needs will improve Outcome: Adequate for Discharge   Problem: Metabolic: Goal: Ability to maintain appropriate glucose levels will improve Outcome: Adequate for Discharge   Problem: Nutritional: Goal: Maintenance of adequate nutrition will improve Outcome: Adequate for Discharge Goal: Progress toward achieving an optimal weight will improve Outcome: Adequate for Discharge   Problem: Clinical Measurements: Goal: Ability to maintain clinical measurements within normal limits will improve Outcome: Adequate for Discharge Goal: Will remain free from infection Outcome: Adequate for Discharge Goal: Diagnostic test results will improve Outcome: Adequate for Discharge Goal: Respiratory complications will improve Outcome: Adequate for Discharge Goal: Cardiovascular complication will be avoided Outcome: Adequate for Discharge   Problem: Activity: Goal: Risk for activity intolerance will decrease Outcome: Adequate for Discharge   Problem: Clinical Measurements: Goal: Ability to maintain clinical measurements within normal limits will improve Outcome: Adequate for Discharge Goal: Will remain free from infection Outcome: Adequate for Discharge Goal: Diagnostic test results will  improve Outcome: Adequate for Discharge Goal: Respiratory complications will improve Outcome: Adequate for Discharge Goal: Cardiovascular complication will be avoided Outcome: Adequate for Discharge   Problem: Activity: Goal: Risk for activity intolerance will decrease Outcome: Adequate for Discharge   Problem: Nutrition: Goal: Adequate nutrition will be maintained Outcome: Adequate for Discharge   Problem: Activity: Goal: Risk for activity intolerance will decrease Outcome: Adequate for Discharge   Problem: Nutrition: Goal: Adequate nutrition will be maintained Outcome: Adequate for Discharge   Problem: Coping: Goal: Level of anxiety will decrease Outcome: Adequate for Discharge   Problem: Activity: Goal: Risk for activity intolerance will decrease Outcome: Adequate for Discharge   Problem: Nutrition: Goal: Adequate nutrition will be maintained Outcome: Adequate for Discharge   Problem: Coping: Goal: Level of anxiety will decrease Outcome: Adequate for Discharge   Problem: Elimination: Goal: Will not experience complications related to bowel motility Outcome: Adequate for Discharge Goal: Will not experience complications related to urinary retention Outcome: Adequate for Discharge   Problem: Nutrition: Goal: Adequate nutrition will be maintained Outcome: Adequate for Discharge   Problem: Coping: Goal: Level of anxiety will decrease Outcome: Adequate for Discharge   Problem: Elimination: Goal: Will not experience complications related to bowel motility Outcome: Adequate for Discharge Goal: Will not experience complications related to urinary retention Outcome: Adequate for Discharge   Problem: Pain Management: Goal: General experience of comfort will improve Outcome: Adequate for Discharge   Problem: Elimination: Goal: Will not experience complications related to bowel motility Outcome: Adequate for Discharge Goal: Will not experience complications  related to urinary retention Outcome: Adequate for Discharge   Problem: Pain Management: Goal: General experience of comfort will improve Outcome: Adequate for Discharge   Problem: Safety: Goal: Ability to remain free from injury will improve Outcome: Adequate for Discharge   Problem: Pain Management: Goal: General experience of comfort will improve Outcome: Adequate for Discharge   Problem: Safety: Goal: Ability to remain free from injury will improve Outcome: Adequate for  Discharge   Problem: Skin Integrity: Goal: Risk for impaired skin integrity will decrease Outcome: Adequate for Discharge   Problem: Pain Management: Goal: General experience of comfort will improve Outcome: Adequate for Discharge   Problem: Safety: Goal: Ability to remain free from injury will improve Outcome: Adequate for Discharge   Problem: Skin Integrity: Goal: Risk for impaired skin integrity will decrease Outcome: Adequate for Discharge

## 2023-06-21 NOTE — Discharge Summary (Addendum)
Physician Discharge Summary   Patient: Dawn Ho MRN: [id number] DOB: 08-23-1950  Admit date:     06/15/2023  Discharge date: 06/21/23  Discharge Physician: Onalee Hua Shavaun Osterloh   PCP: Bernerd Limbo, MD   Recommendations at discharge:  Please follow up with primary care provider within 1-2 weeks  Please repeat BMP and CBC in one week     Hospital Course:  72 y.o. female with a history of CVA with left hemiparesis, T2DM, HLD, HTN, depression/anxiety,  fibromyalgia, bilateral staghorn calculi, and right femoral fracture status post IM nail 08/2020 presents from [facility] and recent admission for Enterococcus UTI (discharged to [facility] 10/20) who presented to the ED on 06/15/2023 due to AMS, decreased per oral intake. She was found to be febrile (102.47F), tachycardic, with leukocytosis (WBC 14.6k) with significant pyuria and hypernatremia (Na 150). Neuroimaging with severe chronic changes but no acute abnormalities. She was admitted for acute metabolic encephalopathy due to sepsis due to UTI, continued on linezolid. Course of linezolid has been completed. ESBL E. coli grew on admission urine culture so meropenem is started 10/29. Pt's hypernatremia has resolved with IVF, though has not taken anything by mouth since arrival. Palliative care consulted for assistance with goals of care discussions.    Assessment and Plan:  Sepsis -Present on admission -Presented with leukocytosis, tachycardia and fever up to 102.1 F. -Secondary to UTI -UA > 50 WBC -06/04/2023 blood cultures negative -Lactic acid 1.3>> 1.1 -Chest x-ray--no infiltrates -sepsis physiology resolved   UTI -Urine culture showed E coli--ESBL -merrem started 10/29 -blood cultures neg -received 4 days merrem -dose fosfomycin on 06/22/23   Acute metabolic encephalopathy -Multifactorial including sepsis/UTI, hypernatremia, AKI -Ammonia 26 -B12 1048 -TSH 1.769 -06/16/2023 CT brain negative -overall improved, but son  states pt has had gradual cognitive decline worsened by recent hospitalizations   AKI -baseline 0.6-0.7 -presented with serum creatinine 1.07 -improved with IVF -serum creatinine 0.71 on day of dc   Hypernatremia -Presented with sodium 150 -Restarted hypotonic fluid>>improved to 139 -Hold furosemide--do not plan to restart as she has poor po intake at baseline   Uncontrolled diabetes mellitus type 2 with hyperglycemia -06/04/2023 hemoglobin A1c 8.3 -Continue Semglee 8 units during hospitalization -Continue NovoLog sliding scale   Essential hypertension -holding metoprolol secondary to soft blood pressure   Metabolic acidosis -Presented with bicarb 19   Personal history of PCN allergy -pt is able to tolerate carbapenems   HLD:  - Continue statin   Depression, anxiety:  - Continue home Tx including SSRI, lorazepam.  -PDMP reviewed--lorazepam 1 mg, #60 filled 06/11/23        Consultants: palliative Procedures performed: none  Disposition: Home Diet recommendation:  Carb modified DISCHARGE MEDICATION: Allergies as of 06/21/2023       Reactions   Penicillins Rash, Swelling   Iodinated Contrast Media Swelling   Per pt: swelling with first contrast xray in 1969. Second contrast xray in the 90s without symptoms:  Patient has been pre-medicated for CT scans in 2016. Needs to have pre-medications before any scans with IV contrast. / TSF 08/30/16 Per pt: swelling with first contrast xray in 1969. Second contrast xray in the 90s without symptoms:  Patient has been pre-medicated for CT scans in 2016. Needs to have pre-medications before any scans with IV contrast. / TSF 08/30/16        Medication List     STOP taking these medications    cefTRIAXone 2 g injection Commonly known as: ROCEPHIN   ertapenem 1  g injection Commonly known as: INVANZ   linezolid 600 MG tablet Commonly known as: ZYVOX   LORazepam 0.5 MG tablet Commonly known as: ATIVAN   metoprolol  tartrate 25 MG tablet Commonly known as: LOPRESSOR   metoprolol tartrate 50 MG tablet Commonly known as: LOPRESSOR   nystatin powder Commonly known as: MYCOSTATIN/NYSTOP       TAKE these medications    acetaminophen 325 MG tablet Commonly known as: TYLENOL Take 650 mg by mouth every 6 (six) hours as needed (pain, discomfort).   atorvastatin 10 MG tablet Commonly known as: LIPITOR Take 10 mg by mouth every evening.   clopidogrel 75 MG tablet Commonly known as: PLAVIX Take 1 tablet (75 mg total) by mouth daily.   escitalopram 20 MG tablet Commonly known as: LEXAPRO Take 1 tablet (20 mg total) by mouth daily. Restart on 06/14/23   fosfomycin 3 g Pack Commonly known as: MONUROL Take 3 g by mouth once for 1 dose. Start taking on: June 22, 2023   gabapentin 300 MG capsule Commonly known as: NEURONTIN Take 1 capsule (300 mg total) by mouth at bedtime.   gabapentin 400 MG capsule Commonly known as: NEURONTIN Take 400 mg by mouth daily.   Gerhardt's butt cream Crea Apply 1 Application topically daily. Start taking on: June 22, 2023   insulin glargine 100 UNIT/ML injection Commonly known as: LANTUS Inject 0.1 mLs (10 Units total) into the skin at bedtime.   insulin lispro 100 UNIT/ML KwikPen Commonly known as: HUMALOG Inject 0-12 Units into the skin See admin instructions. Inject 3 times daily with meals per sliding scale:     0 - 150 : 0; 151 - 200 : 2; 201 - 250 : 4; 251 - 300 : 6; 301 - 350 : 8; 351 - 400 : 10; > 401 : 12 units and call provider   levothyroxine 50 MCG tablet Commonly known as: SYNTHROID Take 50 mcg by mouth daily before breakfast.   MELATONIN PO Take 6 mg by mouth at bedtime.   multivitamin with minerals tablet Take 1 tablet by mouth daily.   nitroGLYCERIN 0.4 MG SL tablet Commonly known as: NITROSTAT Place 0.4 mg under the tongue every 5 (five) minutes as needed for chest pain.   sennosides-docusate sodium 8.6-50 MG  tablet Commonly known as: SENOKOT-S Take 1 tablet by mouth daily.        Discharge Exam: Filed Weights   06/15/23 2119  Weight: 74 kg   HEENT:  [place]/AT, No thrush, no icterus CV:  RRR, no rub, no S3, no S4 Lung:  CTA, no wheeze, no rhonchi Abd:  soft/+BS, NT Ext:  No edema, no lymphangitis, no synovitis, no rash   Condition at discharge: stable  The results of significant diagnostics from this hospitalization (including imaging, microbiology, ancillary and laboratory) are listed below for reference.   Imaging Studies: CT HEAD WO CONTRAST ( )  Result Date: 06/16/2023 CLINICAL DATA:  Mental status change with unknown cause. EXAM: CT HEAD WITHOUT CONTRAST TECHNIQUE: Contiguous axial images were obtained from the base of the skull through the vertex without intravenous contrast. RADIATION DOSE REDUCTION: This exam was performed according to the departmental dose-optimization program which includes automated exposure control, adjustment of the mA and/or kV according to patient size and/or use of iterative reconstruction technique. COMPARISON:  Yesterday FINDINGS: Brain: Chronic perforator infarct at the left basal ganglia and deep white matter. Chronic small vessel ischemic gliosis in the cerebral white matter that is extensive. Cerebral volume loss  that is generalized. No evidence of acute infarct, acute hemorrhage, hydrocephalus, mass, or collection. Vascular: No hyperdense vessel or unexpected calcification. Skull: Normal. Negative for fracture or focal lesion. Sinuses/Orbits: No acute finding. IMPRESSION: 1. No acute finding. 2. Extensive chronic small vessel ischemia. Electronically Signed   By: Tiburcio Pea M.D.   On: 06/16/2023 19:03   CT Head Wo Contrast  Result Date: 06/15/2023 CLINICAL DATA:  Mental status change, unknown cause. History of UTI and encephalopathy. EXAM: CT HEAD WITHOUT CONTRAST TECHNIQUE: Contiguous axial images were obtained from the base of the skull through  the vertex without intravenous contrast. RADIATION DOSE REDUCTION: This exam was performed according to the departmental dose-optimization program which includes automated exposure control, adjustment of the mA and/or kV according to patient size and/or use of iterative reconstruction technique. COMPARISON:  06/04/2023. FINDINGS: Brain: No acute intracranial hemorrhage, midline shift or mass effect. No extra-axial fluid collection. Diffuse atrophy is noted. Extensive subcortical and periventricular white matter hypodensities are present bilaterally. An old infarct is noted in the basal ganglia on the left. Vascular: No hyperdense vessel or unexpected calcification. Skull: Normal. Negative for fracture or focal lesion. Sinuses/Orbits: No acute finding. Other: None. IMPRESSION: 1. No acute intracranial process. 2. Stable atrophy with extensive chronic microvascular ischemic changes and old infarct. Electronically Signed   By: Thornell Sartorius M.D.   On: 06/15/2023 22:40   DG Chest Portable 1 View  Result Date: 06/15/2023 CLINICAL DATA:  Sepsis EXAM: PORTABLE CHEST 1 VIEW COMPARISON:  Chest x-ray 06/04/2023 FINDINGS: Loop recorder overlies the chest. The heart is enlarged. The lungs are clear. There is no pleural effusion or pneumothorax. No acute fractures are seen. IMPRESSION: Cardiomegaly. No acute cardiopulmonary process. Electronically Signed   By: Darliss Cheney M.D.   On: 06/15/2023 22:04   CT HEAD WO CONTRAST ( )  Result Date: 06/04/2023 CLINICAL DATA:  Mental status change of unknown cause EXAM: CT HEAD WITHOUT CONTRAST TECHNIQUE: Contiguous axial images were obtained from the base of the skull through the vertex without intravenous contrast. RADIATION DOSE REDUCTION: This exam was performed according to the departmental dose-optimization program which includes automated exposure control, adjustment of the mA and/or kV according to patient size and/or use of iterative reconstruction technique.  COMPARISON:  02/21/2019 FINDINGS: Brain: Age related volume loss. Chronic small-vessel ischemic changes affect the pons. No focal cerebellar insult. Generalized atrophy of the cerebral hemispheres. Chronic small-vessel ischemic changes throughout the white matter. Old left basal ganglia/radiating white matter tract infarction. Old subcortical infarction at the right parietal vertex. No sign of acute infarction, mass lesion, hemorrhage, hydrocephalus or extra-axial collection. Vascular: There is atherosclerotic calcification of the major vessels at the base of the brain. Skull: Negative Sinuses/Orbits: Clear/normal Other: None IMPRESSION: No acute finding by CT. Atrophy and chronic small-vessel ischemic changes as outlined above. Old left basal ganglia/radiating white matter tract infarction. Old subcortical infarction at the right parietal vertex. Electronically Signed   By: Paulina Fusi M.D.   On: 06/04/2023 18:17   DG Chest Port 1 View  Result Date: 06/04/2023 CLINICAL DATA:  Weakness EXAM: PORTABLE CHEST 1 VIEW COMPARISON:  XR 09/09/20. FINDINGS: Film is rotated to the right. Underinflation. No consolidation, pneumothorax or effusion. No edema. Normal cardiopericardial silhouette. Old right-sided rib fractures. Loop recorder overlying the midthorax. Calcified aorta. Overlapping cardiac leads. IMPRESSION: Rotated radiograph. Loop recorder. No acute cardiopulmonary disease. Electronically Signed   By: Karen Kays M.D.   On: 06/04/2023 15:19    Microbiology: Results for orders placed  or performed during the hospital encounter of 06/15/23  Urine Culture     Status: Abnormal   Collection Time: 06/15/23  9:23 PM   Specimen: Urine, Random  Result Value Ref Range Status   Specimen Description   Final    URINE, RANDOM Performed at [facility], [address] [place], [place] [address]    Special Requests   Final    NONE Reflexed from [phone] Performed at [facility], [address], [place], [place]  [address]    Culture (A)  Final    >=100,000 COLONIES/mL ESCHERICHIA COLI Confirmed Extended Spectrum Beta-Lactamase Producer (ESBL).  In bloodstream infections from ESBL organisms, carbapenems are preferred over piperacillin/tazobactam. They are shown to have a lower risk of mortality.    Report Status 06/18/2023 FINAL  Final   Organism ID, Bacteria ESCHERICHIA COLI (A)  Final      Susceptibility   Escherichia coli - MIC*    AMPICILLIN >=32 RESISTANT Resistant     CEFAZOLIN >=64 RESISTANT Resistant     CEFEPIME >=32 RESISTANT Resistant     CEFTRIAXONE >=64 RESISTANT Resistant     CIPROFLOXACIN >=4 RESISTANT Resistant     GENTAMICIN <=1 SENSITIVE Sensitive     IMIPENEM <=0.25 SENSITIVE Sensitive     NITROFURANTOIN <=16 SENSITIVE Sensitive     TRIMETH/SULFA >=320 RESISTANT Resistant     AMPICILLIN/SULBACTAM >=32 RESISTANT Resistant     PIP/TAZO 16 SENSITIVE Sensitive ug/mL    * >=100,000 COLONIES/mL ESCHERICHIA COLI  Culture, blood (Routine x 2)     Status: None   Collection Time: 06/15/23  9:44 PM   Specimen: BLOOD LEFT ARM  Result Value Ref Range Status   Specimen Description   Final    BLOOD LEFT ARM Performed at [facility], [address], [place], [place] [address]    Special Requests   Final    BOTTLES DRAWN AEROBIC AND ANAEROBIC Blood Culture adequate volume Performed at [facility], [address] [place], [place] [address]    Culture   Final    NO GROWTH 5 DAYS Performed at [facility], [address], [place], [place] [address]    Report Status 06/20/2023 FINAL  Final  Culture, blood (Routine x 2)     Status: None   Collection Time: 06/15/23  9:55 PM   Specimen: Right Antecubital; Blood  Result Value Ref Range Status   Specimen Description   Final    RIGHT ANTECUBITAL Performed at [facility], [address] [place], [place] [address]    Special Requests   Final    BOTTLES DRAWN AEROBIC ONLY Blood Culture adequate volume Performed at [facility], [address] [place], [place] [address]    Culture   Final    NO GROWTH 5 DAYS Performed at [facility], [address], [place], [place] [address]    Report Status 06/20/2023 FINAL  Final  Resp panel by RT-PCR (RSV, Flu A&B, Covid) Anterior Nasal Swab     Status: None   Collection Time: 06/15/23 10:15 PM   Specimen: Anterior Nasal Swab  Result Value Ref Range Status   SARS Coronavirus 2 by RT PCR NEGATIVE NEGATIVE Final    Comment: (NOTE) SARS-CoV-2 target nucleic acids are NOT DETECTED.  The SARS-CoV-2 RNA is generally detectable in upper respiratory specimens during the acute phase of infection. The lowest concentration of SARS-CoV-2 viral copies this assay can detect is 138 copies/mL. A negative result does not preclude SARS-Cov-2 infection and  should not be used as the sole basis for treatment or other patient management decisions. A negative result may occur with  improper specimen collection/handling, submission of specimen other than nasopharyngeal swab, presence of viral mutation(s) within the areas targeted by this assay, and inadequate number of viral copies(<138 copies/mL). A negative result must be combined with clinical observations, patient history, and epidemiological information. The expected result is Negative.  Fact Sheet for Patients:  BloggerCourse.com  Fact Sheet for Healthcare Providers:  SeriousBroker.it  This test is no t yet approved or cleared by the [place] FDA and  has been authorized for detection and/or diagnosis of SARS-CoV-2 by FDA under an Emergency Use Authorization (EUA). This EUA will remain  in effect (meaning this test can be used) for the duration of the COVID-19 declaration under Section 564(b)(1) of the Act, 21 U.S.C.section 360bbb-3(b)(1), unless the authorization is terminated  or revoked sooner.       Influenza A by PCR NEGATIVE NEGATIVE Final   Influenza B by  PCR NEGATIVE NEGATIVE Final    Comment: (NOTE) The Xpert Xpress SARS-CoV-2/FLU/RSV plus assay is intended as an aid in the diagnosis of influenza from Nasopharyngeal swab specimens and should not be used as a sole basis for treatment. Nasal washings and aspirates are unacceptable for Xpert Xpress SARS-CoV-2/FLU/RSV testing.  Fact Sheet for Patients: BloggerCourse.com  Fact Sheet for Healthcare Providers: SeriousBroker.it  This test is not yet approved or cleared by the [place] FDA and has been authorized for detection and/or diagnosis of SARS-CoV-2 by FDA under an Emergency Use Authorization (EUA). This EUA will remain in effect (meaning this test can be used) for the duration of the COVID-19 declaration under Section 564(b)(1) of the Act, 21 U.S.C. section 360bbb-3(b)(1), unless the authorization is terminated or revoked.     Resp Syncytial Virus by PCR NEGATIVE NEGATIVE Final    Comment: (NOTE) Fact Sheet for Patients: BloggerCourse.com  Fact Sheet for Healthcare Providers: SeriousBroker.it  This test is not yet approved or cleared by the [place] FDA and has been authorized for detection and/or diagnosis of SARS-CoV-2 by FDA under an Emergency Use Authorization (EUA). This EUA will remain in effect (meaning this test can be used) for the duration of the COVID-19 declaration under Section 564(b)(1) of the Act, 21 U.S.C. section 360bbb-3(b)(1), unless the authorization is terminated or revoked.  Performed at [facility], [address] [place], [place] [address]     Labs: CBC: Recent Labs  Lab 06/15/23 2144 06/16/23 0605 06/17/23 0456 06/19/23 1213 06/20/23 0507  WBC 14.6* 12.3* 11.4* 8.7 7.4  NEUTROABS 9.5* 8.8*  --   --   --   HGB 11.9* 11.7* 10.2* 9.7* 10.2*  HCT 38.7 37.9 33.7* 30.7* 33.4*  MCV 97.2 96.9 97.1 95.3 96.3  PLT 180 187 163 119* 106*    Basic Metabolic Panel: Recent Labs  Lab 06/16/23 0605 06/16/23 1402 06/17/23 0456 06/18/23 0826 06/19/23 1213 06/20/23 0507 06/21/23 0451  NA 149*   < > 147* 141 141 141 139  K 4.1   < > 3.5 3.6 4.6 3.7 3.6  CL 117*   < > 115* 112* 113* 112* 110  CO2 22   < > 25 22 21* 20* 21*  GLUCOSE 342*   < > 157* 146* 144* 135* 101*  BUN 29*   < > 23 19 14 13 10   CREATININE 0.98   < > 0.78 0.68 0.75 0.72 0.71  CALCIUM 8.6*   < > 8.4*  8.2* 8.4* 8.5* 8.7*  MG 2.1  --   --   --   --  2.0  --    < > = values in this interval not displayed.   Liver Function Tests: Recent Labs  Lab 06/15/23 2144 06/16/23 0605  AST 20 23  ALT 34 33  ALKPHOS 68 64  BILITOT 0.7 0.6  PROT 6.3* 6.0*  ALBUMIN 2.9* 2.6*   CBG: Recent Labs  Lab 06/20/23 1127 06/20/23 1601 06/20/23 2100 06/21/23 0746 06/21/23 1215  GLUCAP 111* 97 106* 107* 127*    Discharge time spent: greater than 30 minutes.  Signed: Catarina Hartshorn, MD [facility] 06/21/2023

## 2024-07-28 ENCOUNTER — Other Ambulatory Visit: Payer: Self-pay

## 2024-07-28 ENCOUNTER — Emergency Department (HOSPITAL_COMMUNITY)

## 2024-07-28 ENCOUNTER — Inpatient Hospital Stay (HOSPITAL_COMMUNITY)
Admission: EM | Admit: 2024-07-28 | Discharge: 2024-08-08 | DRG: 689 | Disposition: A | Discharge status: Skilled Nursing Facility | Source: Skilled Nursing Facility | Attending: Family Medicine | Admitting: Family Medicine

## 2024-07-28 ENCOUNTER — Encounter (HOSPITAL_COMMUNITY): Payer: Self-pay

## 2024-07-28 DIAGNOSIS — E11649 Type 2 diabetes mellitus with hypoglycemia without coma: Secondary | ICD-10-CM | POA: Diagnosis present

## 2024-07-28 DIAGNOSIS — Z7902 Long term (current) use of antithrombotics/antiplatelets: Secondary | ICD-10-CM

## 2024-07-28 DIAGNOSIS — I10 Essential (primary) hypertension: Secondary | ICD-10-CM | POA: Diagnosis present

## 2024-07-28 DIAGNOSIS — Z880 Allergy status to penicillin: Secondary | ICD-10-CM

## 2024-07-28 DIAGNOSIS — Z91041 Radiographic dye allergy status: Secondary | ICD-10-CM

## 2024-07-28 DIAGNOSIS — Z90710 Acquired absence of both cervix and uterus: Secondary | ICD-10-CM

## 2024-07-28 DIAGNOSIS — N200 Calculus of kidney: Secondary | ICD-10-CM | POA: Diagnosis present

## 2024-07-28 DIAGNOSIS — Z79890 Hormone replacement therapy (postmenopausal): Secondary | ICD-10-CM

## 2024-07-28 DIAGNOSIS — R6520 Severe sepsis without septic shock: Secondary | ICD-10-CM | POA: Diagnosis present

## 2024-07-28 DIAGNOSIS — E785 Hyperlipidemia, unspecified: Secondary | ICD-10-CM | POA: Diagnosis present

## 2024-07-28 DIAGNOSIS — E861 Hypovolemia: Secondary | ICD-10-CM | POA: Diagnosis present

## 2024-07-28 DIAGNOSIS — Z87442 Personal history of urinary calculi: Secondary | ICD-10-CM

## 2024-07-28 DIAGNOSIS — F0154 Vascular dementia, unspecified severity, with anxiety: Secondary | ICD-10-CM | POA: Diagnosis present

## 2024-07-28 DIAGNOSIS — E1165 Type 2 diabetes mellitus with hyperglycemia: Secondary | ICD-10-CM | POA: Diagnosis present

## 2024-07-28 DIAGNOSIS — M797 Fibromyalgia: Secondary | ICD-10-CM | POA: Diagnosis present

## 2024-07-28 DIAGNOSIS — E871 Hypo-osmolality and hyponatremia: Secondary | ICD-10-CM | POA: Diagnosis present

## 2024-07-28 DIAGNOSIS — Z1612 Extended spectrum beta lactamase (ESBL) resistance: Secondary | ICD-10-CM | POA: Diagnosis present

## 2024-07-28 DIAGNOSIS — E872 Acidosis, unspecified: Secondary | ICD-10-CM | POA: Diagnosis present

## 2024-07-28 DIAGNOSIS — N12 Tubulo-interstitial nephritis, not specified as acute or chronic: Secondary | ICD-10-CM | POA: Diagnosis present

## 2024-07-28 DIAGNOSIS — A4151 Sepsis due to Escherichia coli [E. coli]: Secondary | ICD-10-CM | POA: Diagnosis present

## 2024-07-28 DIAGNOSIS — Z79899 Other long term (current) drug therapy: Secondary | ICD-10-CM

## 2024-07-28 DIAGNOSIS — E039 Hypothyroidism, unspecified: Secondary | ICD-10-CM | POA: Diagnosis present

## 2024-07-28 DIAGNOSIS — E876 Hypokalemia: Secondary | ICD-10-CM | POA: Diagnosis present

## 2024-07-28 DIAGNOSIS — T380X5A Adverse effect of glucocorticoids and synthetic analogues, initial encounter: Secondary | ICD-10-CM | POA: Diagnosis present

## 2024-07-28 DIAGNOSIS — F32A Depression, unspecified: Secondary | ICD-10-CM | POA: Diagnosis present

## 2024-07-28 DIAGNOSIS — Z794 Long term (current) use of insulin: Secondary | ICD-10-CM

## 2024-07-28 DIAGNOSIS — N3000 Acute cystitis without hematuria: Principal | ICD-10-CM | POA: Diagnosis present

## 2024-07-28 DIAGNOSIS — E11319 Type 2 diabetes mellitus with unspecified diabetic retinopathy without macular edema: Secondary | ICD-10-CM | POA: Diagnosis present

## 2024-07-28 DIAGNOSIS — Z8249 Family history of ischemic heart disease and other diseases of the circulatory system: Secondary | ICD-10-CM

## 2024-07-28 DIAGNOSIS — N179 Acute kidney failure, unspecified: Secondary | ICD-10-CM | POA: Diagnosis present

## 2024-07-28 DIAGNOSIS — E870 Hyperosmolality and hypernatremia: Secondary | ICD-10-CM | POA: Diagnosis present

## 2024-07-28 DIAGNOSIS — E860 Dehydration: Secondary | ICD-10-CM | POA: Diagnosis present

## 2024-07-28 HISTORY — DX: Encephalopathy, unspecified: G93.40

## 2024-07-28 HISTORY — DX: Cognitive communication deficit: R41.841

## 2024-07-28 HISTORY — DX: Vascular dementia, unspecified severity, without behavioral disturbance, psychotic disturbance, mood disturbance, and anxiety: F01.50

## 2024-07-28 HISTORY — DX: Chronic atrial fibrillation, unspecified: I48.20

## 2024-07-28 HISTORY — DX: Dysphagia, unspecified: R13.10

## 2024-07-28 HISTORY — DX: Cerebral infarction, unspecified: I63.9

## 2024-07-28 LAB — URINALYSIS, W/ REFLEX TO CULTURE (INFECTION SUSPECTED)
Bilirubin Urine: NEGATIVE
Glucose, UA: 50 mg/dL — AB
Ketones, ur: NEGATIVE mg/dL
Nitrite: POSITIVE — AB
Protein, ur: 100 mg/dL — AB
RBC / HPF: >50 RBC/hpf (ref 0–5)
Specific Gravity, Urine: 1.01 (ref 1.005–1.030)
WBC, UA: >50 WBC/hpf (ref 0–5)
pH: 8 (ref 5.0–8.0)

## 2024-07-28 LAB — CBC WITH DIFFERENTIAL/PLATELET
Abs Immature Granulocytes: 0.03 K/uL (ref 0.00–0.07)
Basophils Absolute: 0.1 K/uL (ref 0.0–0.1)
Basophils Relative: 1 %
Eosinophils Absolute: 0 K/uL (ref 0.0–0.5)
Eosinophils Relative: 0 %
HCT: 40.4 % (ref 36.0–46.0)
Hemoglobin: 12.6 g/dL (ref 12.0–15.0)
Immature Granulocytes: 0 %
Lymphocytes Relative: 19 %
Lymphs Abs: 1.7 K/uL (ref 0.7–4.0)
MCH: 28.6 pg (ref 26.0–34.0)
MCHC: 31.2 g/dL (ref 30.0–36.0)
MCV: 91.6 fL (ref 80.0–100.0)
Monocytes Absolute: 0.7 K/uL (ref 0.1–1.0)
Monocytes Relative: 7 %
Neutro Abs: 6.6 K/uL (ref 1.7–7.7)
Neutrophils Relative %: 73 %
Platelets: 343 K/uL (ref 150–400)
RBC: 4.41 MIL/uL (ref 3.87–5.11)
RDW: 14.8 % (ref 11.5–15.5)
WBC: 9.1 K/uL (ref 4.0–10.5)
nRBC: 0 % (ref 0.0–0.2)

## 2024-07-28 LAB — COMPREHENSIVE METABOLIC PANEL WITH GFR
ALT: 10 U/L (ref 0–44)
AST: 13 U/L — ABNORMAL LOW (ref 15–41)
Albumin: 3.5 g/dL (ref 3.5–5.0)
Alkaline Phosphatase: 103 U/L (ref 38–126)
Anion gap: 17 — ABNORMAL HIGH (ref 5–15)
BUN: 34 mg/dL — ABNORMAL HIGH (ref 8–23)
CO2: 19 mmol/L — ABNORMAL LOW (ref 22–32)
Calcium: 9.5 mg/dL (ref 8.9–10.3)
Chloride: 113 mmol/L — ABNORMAL HIGH (ref 98–111)
Creatinine, Ser: 1.73 mg/dL — ABNORMAL HIGH (ref 0.44–1.00)
GFR, Estimated: 30 mL/min — ABNORMAL LOW (ref >60)
Glucose, Bld: 306 mg/dL — ABNORMAL HIGH (ref 70–99)
Potassium: 4 mmol/L (ref 3.5–5.1)
Sodium: 149 mmol/L — ABNORMAL HIGH (ref 135–145)
Total Bilirubin: 0.3 mg/dL (ref 0.0–1.2)
Total Protein: 7.8 g/dL (ref 6.5–8.1)

## 2024-07-28 LAB — PROTIME-INR
INR: 1.1 (ref 0.8–1.2)
Prothrombin Time: 15 s (ref 11.4–15.2)

## 2024-07-28 LAB — LACTIC ACID, PLASMA: Lactic Acid, Venous: 1 mmol/L (ref 0.5–1.9)

## 2024-07-28 LAB — CBG MONITORING, ED: Glucose-Capillary: 306 mg/dL — ABNORMAL HIGH (ref 70–99)

## 2024-07-28 MED ORDER — SODIUM CHLORIDE 0.9 % IV BOLUS
1000.0000 mL | Freq: Once | INTRAVENOUS | Status: AC
  Administered 2024-07-28: 1000 mL via INTRAVENOUS

## 2024-07-28 MED ORDER — SODIUM CHLORIDE 0.9 % IV SOLN
2.0000 g | Freq: Once | INTRAVENOUS | Status: AC
  Administered 2024-07-28: 2 g via INTRAVENOUS
  Filled 2024-07-28: qty 20

## 2024-07-28 NOTE — ED Notes (Signed)
Blood cultures have been drawn by lab

## 2024-07-28 NOTE — ED Notes (Signed)
 ED Provider at bedside.

## 2024-07-28 NOTE — H&P (Signed)
 History and Physical  Dawn Ho FMW:[id number] DOB: [birth year] DOA: 07/28/2024  Referring physician: ***  PCP: Celine Erla Bong, MD  Outpatient Specialists: *** Patient coming from: *** & is able to ambulate ***  Chief Complaint: ***   HPI: Dawn Ho is a 73 y.o. female with medical history significant for *** (For level 3, the HPI must include 4+ descriptors: Location, Quality, Severity, Duration, Timing, Context, modifying factors, associated signs/symptoms and/or status of 3+ chronic problems.)   ED Course: ***  Review of Systems: Review of systems as noted in the HPI. All other systems reviewed and are negative.   Past Medical History:  Diagnosis Date   Cataract, left eye    Cerebral infarction (HCC)    Chronic atrial fibrillation (HCC)    Cognitive communication deficit    CVA (cerebral infarction)    Depression with anxiety    Diabetes mellitus    Dyslipidemia    Dysphagia    Encephalopathy    Fibromyalgia    Hypertension    Retinopathy    Stroke (HCC)    L. hemiparesis   TIA (transient ischemic attack)    Vascular dementia [facility])    Past Surgical History:  Procedure Laterality Date   ABDOMINAL HYSTERECTOMY     age 30, removed one ovary- excessive bleeding   CESAREAN SECTION     CHOLECYSTECTOMY     LOOP RECORDER IMPLANT     Dr. Kelsie placed 10/15/2014   ORIF HIP FRACTURE Right 09/10/2020   Procedure: im nail;  Surgeon: Barbarann Oneil BROCKS, MD;  Location: [facility];  Service: Orthopedics;  Laterality: Right;   TEE WITHOUT CARDIOVERSION N/A 10/15/2014   Procedure: TRANSESOPHAGEAL ECHOCARDIOGRAM (TEE);  Surgeon: Vina Okey GAILS, MD;  Location: [facility] ENDOSCOPY;  Service: Cardiovascular;  Laterality: N/A;    Social History:  reports that she has never smoked. She has never used smokeless tobacco. She reports that she does not drink alcohol and does not use drugs.   Allergies  Allergen Reactions   Penicillins Rash and Swelling   Iodinated Contrast Media Swelling     Per pt: swelling with first contrast xray in 1969. Second contrast xray in the 90s without symptoms:  Patient has been pre-medicated for CT scans in 2016. Needs to have pre-medications before any scans with IV contrast. / TSF 08/30/16    Family History  Problem Relation Age of Onset   Heart disease Father    Hypertension Sister    Colon cancer Neg Hx    Colon polyps Neg Hx     ***  Prior to Admission medications   Medication Sig Start Date End Date Taking? Authorizing Provider  acetaminophen  (TYLENOL ) 325 MG tablet Take 650 mg by mouth every 6 (six) hours as needed for fever or mild pain (pain score 1-3).   Yes [provider]  atorvastatin  (LIPITOR) 10 MG tablet Take 10 mg by mouth every evening. 04/12/23  Yes [provider]  clopidogrel  (PLAVIX ) 75 MG tablet Take 1 tablet (75 mg total) by mouth daily. 10/18/14  Yes Drusilla Sabas RAMAN, MD  estradiol (ESTRACE) 0.01 % CREA vaginal cream Place 1 Applicatorful vaginally every Monday, Wednesday, and Friday.   Yes [provider]  ferrous sulfate 325 (65 FE) MG tablet Take 325 mg by mouth 2 (two) times daily with a meal.   Yes [provider]  gabapentin  (NEURONTIN ) 300 MG capsule Take 1 capsule (300 mg total) by mouth at bedtime. 10/18/14  Yes Drusilla Sabas RAMAN, MD  gabapentin  (NEURONTIN ) 400 MG capsule Take 400 mg by mouth daily. 03/12/22  Yes [provider]  insulin  glargine (LANTUS ) 100 UNIT/ML injection Inject 0.1 mLs (10 Units total) into the skin at bedtime. Patient taking differently: Inject 10 Units into the skin 2 (two) times daily. 09/12/20  Yes Dahal, Chapman, MD  insulin  lispro (HUMALOG) 100 UNIT/ML KwikPen Inject 0-12 Units into the skin See admin instructions. Inject 3 times daily with meals per sliding scale:     0 - 150 : 0; 151 - 200 : 2; 201 - 250 : 4; 251 - 300 : 6; 301 - 350 : 8; 351 - 400 : 10; > 401 : 12 units and call provider 06/04/23  Yes [provider]  levothyroxine   (SYNTHROID ) 50 MCG tablet Take 50 mcg by mouth daily before breakfast. 04/12/23  Yes [provider]  LORazepam  (ATIVAN ) 0.5 MG tablet Take 0.5 mg by mouth 2 (two) times daily.   Yes [provider]  MELATONIN PO Take 6 mg by mouth at bedtime.   Yes [provider]  Multiple Vitamins-Minerals (MULTIVITAMIN WITH MINERALS) tablet Take 1 tablet by mouth daily.   Yes [provider]  nitroGLYCERIN  (NITROSTAT ) 0.4 MG SL tablet Place 0.4 mg under the tongue every 5 (five) minutes as needed for chest pain.   Yes [provider]  ondansetron  (ZOFRAN ) 4 MG tablet Take 4 mg by mouth every 8 (eight) hours as needed for nausea or vomiting.   Yes [provider]  sennosides-docusate sodium  (SENOKOT-S) 8.6-50 MG tablet Take 1 tablet by mouth daily.   Yes [provider]  sertraline  (ZOLOFT ) 50 MG tablet Take 50 mg by mouth daily.   Yes [provider]    Physical Exam: BP (!) 159/90   Pulse 95   Temp 99.7 F (37.6 C) (Oral)   Resp (!) 21   Ht 5' (1.524 m)   Wt 74 kg   SpO2 94%   BMI 31.86 kg/m   General: 73 y.o. year-old female well developed well nourished in no acute distress.  Alert and oriented x3. Cardiovascular: Regular rate and rhythm with no rubs or gallops.  No thyromegaly or JVD noted.  No lower extremity edema. 2/4 pulses in all 4 extremities. Respiratory: Clear to auscultation with no wheezes or rales. Good inspiratory effort. Abdomen: Soft nontender nondistended with normal bowel sounds x4 quadrants. Muskuloskeletal: No cyanosis, clubbing or edema noted bilaterally Neuro: CN II-XII intact, strength, sensation, reflexes Skin: No ulcerative lesions noted or rashes Psychiatry: Judgement and insight appear normal. Mood is appropriate for condition and setting          Labs on Admission:  Basic Metabolic Panel: Recent Labs  Lab 07/28/24 1954  NA 149*  K 4.0  CL 113*  CO2 19*  GLUCOSE 306*  BUN 34*  CREATININE  1.73*  CALCIUM  9.5   Liver Function Tests: Recent Labs  Lab 07/28/24 1954  AST 13*  ALT 10  ALKPHOS 103  BILITOT 0.3  PROT 7.8  ALBUMIN 3.5   No results for input(s): LIPASE, AMYLASE in the last 168 hours. No results for input(s): AMMONIA in the last 168 hours. CBC: Recent Labs  Lab 07/28/24 1954  WBC 9.1  NEUTROABS 6.6  HGB 12.6  HCT 40.4  MCV 91.6  PLT 343   Cardiac Enzymes: No results for input(s): CKTOTAL, CKMB, CKMBINDEX, TROPONINI in the last 168 hours.  BNP (last 3 results) No results for input(s): BNP in the last 8760  hours.  ProBNP (last 3 results) No results for input(s): PROBNP in the last 8760 hours.  CBG: Recent Labs  Lab 07/28/24 1925  GLUCAP 306*    Radiological Exams on Admission: DG Chest Port 1 View if patient is in a treatment room. Result Date: 07/28/2024 CLINICAL DATA:  Suspected sepsis. EXAM: PORTABLE CHEST 1 VIEW COMPARISON:  Chest radiograph dated 06/15/2023. FINDINGS: No focal consolidation, pleural effusion, pneumothorax. Stable cardiac silhouette. Loop recorder device. No acute osseous pathology. IMPRESSION: No active disease. Electronically Signed   By: Vanetta Chou M.D.   On: 07/28/2024 20:01    EKG: I independently viewed the EKG done and my findings are as followed: ***   Assessment/Plan Present on Admission:  AKI (acute kidney injury)  Principal Problem:   AKI (acute kidney injury)   DVT prophylaxis: ***   Code Status: ***   Family Communication: ***   Disposition Plan: ***   Consults called: ***   Admission status: ***    Status is: Observation {Observation:23811}   Terry LOISE Hurst MD [facility] Pager [phone]  If 7PM-7AM, please contact night-coverage [email or web address] Password [email or web address]  07/28/2024, 11:28 PM

## 2024-07-28 NOTE — ED Provider Notes (Signed)
 [place] EMERGENCY DEPARTMENT AT [facility] Provider Note   CSN: [id number] Arrival date & time: 07/28/24  1914     Patient presents with: not feeling well   Dawn Ho is a 73 y.o. female.  {Add pertinent medical, surgical, social history, OB history to YEP:67052} Patient has a history of stroke, diabetes.  Patient is weaker than usual and was sent from the nursing home.   Weakness      Prior to Admission medications   Medication Sig Start Date End Date Taking? Authorizing Provider  acetaminophen  (TYLENOL ) 325 MG tablet Take 650 mg by mouth every 6 (six) hours as needed for fever or mild pain (pain score 1-3).   Yes [provider]  atorvastatin  (LIPITOR) 10 MG tablet Take 10 mg by mouth every evening. 04/12/23  Yes [provider]  clopidogrel  (PLAVIX ) 75 MG tablet Take 1 tablet (75 mg total) by mouth daily. 10/18/14  Yes Drusilla Sabas RAMAN, MD  estradiol (ESTRACE) 0.01 % CREA vaginal cream Place 1 Applicatorful vaginally every Monday, Wednesday, and Friday.   Yes [provider]  ferrous sulfate 325 (65 FE) MG tablet Take 325 mg by mouth 2 (two) times daily with a meal.   Yes [provider]  gabapentin  (NEURONTIN ) 300 MG capsule Take 1 capsule (300 mg total) by mouth at bedtime. 10/18/14  Yes Drusilla Sabas RAMAN, MD  gabapentin  (NEURONTIN ) 400 MG capsule Take 400 mg by mouth daily. 03/12/22  Yes [provider]  insulin  glargine (LANTUS ) 100 UNIT/ML injection Inject 0.1 mLs (10 Units total) into the skin at bedtime. Patient taking differently: Inject 10 Units into the skin 2 (two) times daily. 09/12/20  Yes Dahal, Chapman, MD  insulin  lispro (HUMALOG) 100 UNIT/ML KwikPen Inject 0-12 Units into the skin See admin instructions. Inject 3 times daily with meals per sliding scale:     0 - 150 : 0; 151 - 200 : 2; 201 - 250 : 4; 251 - 300 : 6; 301 - 350 : 8; 351 - 400 : 10; > 401 : 12 units and call provider 06/04/23  Yes [provider]  levothyroxine  (SYNTHROID ) 50 MCG tablet Take 50 mcg by mouth daily before breakfast. 04/12/23  Yes [provider]  LORazepam  (ATIVAN ) 0.5 MG tablet Take 0.5 mg by mouth 2 (two) times daily.   Yes [provider]  MELATONIN PO Take 6 mg by mouth at bedtime.   Yes [provider]  Multiple Vitamins-Minerals (MULTIVITAMIN WITH MINERALS) tablet Take 1 tablet by mouth daily.   Yes [provider]  nitroGLYCERIN  (NITROSTAT ) 0.4 MG SL tablet Place 0.4 mg under the tongue every 5 (five) minutes as needed for chest pain.   Yes [provider]  ondansetron  (ZOFRAN ) 4 MG tablet Take 4 mg by mouth every 8 (eight) hours as needed for nausea or vomiting.   Yes [provider]  sennosides-docusate sodium  (SENOKOT-S) 8.6-50 MG tablet Take 1 tablet by mouth daily.   Yes [provider]  sertraline  (ZOLOFT ) 50 MG tablet Take 50 mg by mouth daily.   Yes [provider]    Allergies: Penicillins and Iodinated contrast media    Review of Systems  Neurological:  Positive for weakness.    Updated Vital Signs BP (!) 159/90   Pulse 95   Temp 99 F (37.2 C)   Resp (!) 21   Ht 5' (1.524 m)   Wt 74 kg   SpO2 94%   BMI  31.86 kg/m   Physical Exam  (all labs ordered are listed, but only abnormal results are displayed) Labs Reviewed  COMPREHENSIVE METABOLIC PANEL WITH GFR - Abnormal; Notable for the following components:      Result Value   Sodium 149 (*)    Chloride 113 (*)    CO2 19 (*)    Glucose, Bld 306 (*)    BUN 34 (*)    Creatinine, Ser 1.73 (*)    AST 13 (*)    GFR, Estimated 30 (*)    Anion gap 17 (*)    All other components within normal limits  URINALYSIS, W/ REFLEX TO CULTURE (INFECTION SUSPECTED) - Abnormal; Notable for the following components:   APPearance CLOUDY (*)    Glucose, UA 50 (*)    Hgb urine dipstick MODERATE (*)    Protein, ur 100 (*)    Nitrite POSITIVE (*)    Leukocytes,Ua LARGE (*)     Bacteria, UA FEW (*)    All other components within normal limits  CBG MONITORING, ED - Abnormal; Notable for the following components:   Glucose-Capillary 306 (*)    All other components within normal limits  CULTURE, BLOOD (ROUTINE X 2)  CULTURE, BLOOD (ROUTINE X 2)  URINE CULTURE  LACTIC ACID, PLASMA  CBC WITH DIFFERENTIAL/PLATELET  PROTIME-INR    EKG: None  Radiology: DG Chest Port 1 View if patient is in a treatment room. Result Date: 07/28/2024 CLINICAL DATA:  Suspected sepsis. EXAM: PORTABLE CHEST 1 VIEW COMPARISON:  Chest radiograph dated 06/15/2023. FINDINGS: No focal consolidation, pleural effusion, pneumothorax. Stable cardiac silhouette. Loop recorder device. No acute osseous pathology. IMPRESSION: No active disease. Electronically Signed   By: Vanetta Chou M.D.   On: 07/28/2024 20:01    {Document cardiac monitor, telemetry assessment procedure when appropriate:32947} Procedures   Medications Ordered in the ED  sodium chloride  0.9 % bolus 1,000 mL (0 mLs Intravenous Stopped 07/28/24 2141)  cefTRIAXone  (ROCEPHIN ) 2 g in sodium chloride  0.9 % 100 mL IVPB (0 g Intravenous Stopped 07/28/24 2211)      {Click here for ABCD2, HEART and other calculators REFRESH Note before signing:1}                              Medical Decision Making Amount and/or Complexity of Data Reviewed Labs: ordered. Radiology: ordered.  Risk Decision regarding hospitalization.   Patient with an AKI and UTI.  She will be admitted to medicine  {Document critical care time when appropriate  Document review of labs and clinical decision tools ie CHADS2VASC2, etc  Document your independent review of radiology images and any outside records  Document your discussion with family members, caretakers and with consultants  Document social determinants of health affecting pt's care  Document your decision making why or why not admission, treatments were needed:32947:::1}   Final diagnoses:   None    ED Discharge Orders     None

## 2024-07-28 NOTE — ED Notes (Signed)
 ED TO INPATIENT HANDOFF REPORT  ED Nurse Name and Phone #:   S Name/Age/Gender Dawn Ho 73 y.o. female Room/Bed: APA08/APA08  Code Status   Code Status: Prior  Home/SNF/Other Nursing Home Patient oriented to: disoriented x 4 Is this baseline? Yes   Triage Complete: Triage complete  Chief Complaint AKI (acute kidney injury) [N17.9]  Triage Note Pt bib EMS from [facility] with hx of encephalopathy, pt tracks with eyes but is not verbal, per EMS staff reports this is her baseline., report pt just hasn't been feeling well the past few days. Pt sugar was high, 320 CBG - SNF gave unknown amount of insulin  prior to arrival. EMS checked in route and CBG was 316. Temp 99.5   Allergies Allergies  Allergen Reactions   Penicillins Rash and Swelling   Iodinated Contrast Media Swelling    Per pt: swelling with first contrast xray in 1969. Second contrast xray in the 90s without symptoms:  Patient has been pre-medicated for CT scans in 2016. Needs to have pre-medications before any scans with IV contrast. / TSF 08/30/16    Level of Care/Admitting Diagnosis ED Disposition     ED Disposition  Admit   Condition  --   Comment  Hospital Area: [facility] [[id number]]  Level of Care: Telemetry [5]  Diagnosis: AKI (acute kidney injury) [309830]  Admitting Physician: SHONA TERRY SAILOR [[id number]]  Attending Physician: SHONA TERRY SAILOR [[id number]]          B Medical/Surgery History Past Medical History:  Diagnosis Date   Cataract, left eye    Cerebral infarction (HCC)    Chronic atrial fibrillation (HCC)    Cognitive communication deficit    CVA (cerebral infarction)    Depression with anxiety    Diabetes mellitus    Dyslipidemia    Dysphagia    Encephalopathy    Fibromyalgia    Hypertension    Retinopathy    Stroke (HCC)    L. hemiparesis   TIA (transient ischemic attack)    Vascular dementia [facility])    Past Surgical History:  Procedure Laterality Date    ABDOMINAL HYSTERECTOMY     age 28, removed one ovary- excessive bleeding   CESAREAN SECTION     CHOLECYSTECTOMY     LOOP RECORDER IMPLANT     Dr. Kelsie placed 10/15/2014   ORIF HIP FRACTURE Right 09/10/2020   Procedure: im nail;  Surgeon: Barbarann Oneil BROCKS, MD;  Location: [facility];  Service: Orthopedics;  Laterality: Right;   TEE WITHOUT CARDIOVERSION N/A 10/15/2014   Procedure: TRANSESOPHAGEAL ECHOCARDIOGRAM (TEE);  Surgeon: Vina Okey GAILS, MD;  Location: [facility] ENDOSCOPY;  Service: Cardiovascular;  Laterality: N/A;     A IV Location/Drains/Wounds Patient Lines/Drains/Airways Status     Active Line/Drains/Airways     Name Placement date Placement time Site Days   Peripheral IV 07/28/24 20 G 1 Right Hand 07/28/24  2013  Hand  less than 1            Intake/Output Last 24 hours No intake or output data in the 24 hours ending 07/28/24 2341  Labs/Imaging Results for orders placed or performed during the hospital encounter of 07/28/24 (from the past 48 hours)  CBG monitoring, ED     Status: Abnormal   Collection Time: 07/28/24  7:25 PM  Result Value Ref Range   Glucose-Capillary 306 (H) 70 - 99 mg/dL    Comment: Glucose reference range applies only to samples taken after fasting for at least  8 hours.  Comprehensive metabolic panel     Status: Abnormal   Collection Time: 07/28/24  7:54 PM  Result Value Ref Range   Sodium 149 (H) 135 - 145 mmol/L   Potassium 4.0 3.5 - 5.1 mmol/L   Chloride 113 (H) 98 - 111 mmol/L   CO2 19 (L) 22 - 32 mmol/L   Glucose, Bld 306 (H) 70 - 99 mg/dL    Comment: Glucose reference range applies only to samples taken after fasting for at least 8 hours.   BUN 34 (H) 8 - 23 mg/dL   Creatinine, Ser 8.26 (H) 0.44 - 1.00 mg/dL   Calcium  9.5 8.9 - 10.3 mg/dL   Total Protein 7.8 6.5 - 8.1 g/dL   Albumin 3.5 3.5 - 5.0 g/dL   AST 13 (L) 15 - 41 U/L   ALT 10 0 - 44 U/L   Alkaline Phosphatase 103 38 - 126 U/L   Total Bilirubin 0.3 0.0 - 1.2 mg/dL   GFR, Estimated 30  (L) >60 mL/min    Comment: (NOTE) Calculated using the CKD-EPI Creatinine Equation (2021)    Anion gap 17 (H) 5 - 15    Comment: Performed at [facility], [address], [place], [place] [address]  Lactic acid, plasma     Status: None   Collection Time: 07/28/24  7:54 PM  Result Value Ref Range   Lactic Acid, Venous 1.0 0.5 - 1.9 mmol/L    Comment: Performed at [facility], [address] [place], [place] [address]  CBC with Differential     Status: None   Collection Time: 07/28/24  7:54 PM  Result Value Ref Range   WBC 9.1 4.0 - 10.5 K/uL   RBC 4.41 3.87 - 5.11 MIL/uL   Hemoglobin 12.6 12.0 - 15.0 g/dL   HCT 59.5 63.9 - 53.9 %   MCV 91.6 80.0 - 100.0 fL   MCH 28.6 26.0 - 34.0 pg   MCHC 31.2 30.0 - 36.0 g/dL   RDW 85.1 88.4 - 84.4 %   Platelets 343 150 - 400 K/uL   nRBC 0.0 0.0 - 0.2 %   Neutrophils Relative % 73 %   Neutro Abs 6.6 1.7 - 7.7 K/uL   Lymphocytes Relative 19 %   Lymphs Abs 1.7 0.7 - 4.0 K/uL   Monocytes Relative 7 %   Monocytes Absolute 0.7 0.1 - 1.0 K/uL   Eosinophils Relative 0 %   Eosinophils Absolute 0.0 0.0 - 0.5 K/uL   Basophils Relative 1 %   Basophils Absolute 0.1 0.0 - 0.1 K/uL   Immature Granulocytes 0 %   Abs Immature Granulocytes 0.03 0.00 - 0.07 K/uL    Comment: Performed at [facility], [address], [place], [place] [address]  Protime-INR     Status: None   Collection Time: 07/28/24  7:54 PM  Result Value Ref Range   Prothrombin Time 15.0 11.4 - 15.2 seconds   INR 1.1 0.8 - 1.2    Comment: (NOTE) INR goal varies based on device and disease states. Performed at [facility], [address], [place], [place] [address]   Urinalysis, w/ Reflex to Culture (Infection Suspected) -Urine, Catheterized     Status: Abnormal   Collection Time: 07/28/24  8:10 PM  Result Value Ref Range   Specimen Source URINE, CATHETERIZED    Color, Urine YELLOW YELLOW   APPearance CLOUDY (A) CLEAR   Specific Gravity, Urine 1.010 1.005 - 1.030   pH 8.0 5.0 - 8.0  Glucose, UA 50 (A) NEGATIVE mg/dL   Hgb urine dipstick MODERATE (A) NEGATIVE   Bilirubin Urine NEGATIVE NEGATIVE   Ketones, ur NEGATIVE NEGATIVE mg/dL   Protein, ur 899 (A) NEGATIVE mg/dL   Nitrite POSITIVE (A) NEGATIVE   Leukocytes,Ua LARGE (A) NEGATIVE   RBC / HPF >50 0 - 5 RBC/hpf   WBC, UA >50 0 - 5 WBC/hpf    Comment:        Reflex urine culture not performed if WBC <=10, OR if Squamous epithelial cells >5. If Squamous epithelial cells >5 suggest recollection.    Bacteria, UA FEW (A) NONE SEEN   Squamous Epithelial / HPF 0-5 0 - 5 /HPF   WBC Clumps PRESENT     Comment: Performed at [facility], [address] [place], [place] [address]   DG Chest [place] 1 View if patient is in a treatment room. Result Date: 07/28/2024 CLINICAL DATA:  Suspected sepsis. EXAM: PORTABLE CHEST 1 VIEW COMPARISON:  Chest radiograph dated 06/15/2023. FINDINGS: No focal consolidation, pleural effusion, pneumothorax. Stable cardiac silhouette. Loop recorder device. No acute osseous pathology. IMPRESSION: No active disease. Electronically Signed   By: Vanetta Chou M.D.   On: 07/28/2024 20:01    Pending Labs Unresulted Labs (From admission, onward)     Start     Ordered   07/28/24 2010  Urine Culture  Once,   R        07/28/24 2010   07/28/24 1929  Culture, blood (Routine x 2)  BLOOD CULTURE X 2,   R (with STAT occurrences)      07/28/24 1929            Vitals/Pain Today's Vitals   07/28/24 2100 07/28/24 2130 07/28/24 2200 07/28/24 2300  BP: (!) 159/83 (!) 143/90 (!) 157/88 (!) 159/90  Pulse: (!) 102 97 96 95  Resp: (!) 22 20 (!) 21 (!) 21  Temp:    99 F (37.2 C)  TempSrc:      SpO2: 94% 93% 94% 94%  Weight:      Height:        Isolation Precautions No active isolations  Medications Medications  sodium chloride  0.9 % bolus 1,000 mL (0 mLs Intravenous Stopped 07/28/24 2141)  cefTRIAXone  (ROCEPHIN ) 2 g in sodium chloride  0.9 % 100 mL IVPB (0 g Intravenous Stopped 07/28/24 2211)     Mobility non-ambulatory     Focused Assessments    R Recommendations: See Admitting Provider Note  Report given to:   Additional Notes:

## 2024-07-28 NOTE — ED Triage Notes (Signed)
 Pt bib EMS from [facility] with hx of encephalopathy, pt tracks with eyes but is not verbal, per EMS staff reports this is her baseline., report pt just hasn't been feeling well the past few days. Pt sugar was high, 320 CBG - SNF gave unknown amount of insulin  prior to arrival. EMS checked in route and CBG was 316. Temp 99.5

## 2024-07-29 DIAGNOSIS — F0154 Vascular dementia, unspecified severity, with anxiety: Secondary | ICD-10-CM | POA: Diagnosis present

## 2024-07-29 DIAGNOSIS — N200 Calculus of kidney: Secondary | ICD-10-CM | POA: Diagnosis not present

## 2024-07-29 DIAGNOSIS — Z7902 Long term (current) use of antithrombotics/antiplatelets: Secondary | ICD-10-CM | POA: Diagnosis not present

## 2024-07-29 DIAGNOSIS — N12 Tubulo-interstitial nephritis, not specified as acute or chronic: Secondary | ICD-10-CM | POA: Diagnosis present

## 2024-07-29 DIAGNOSIS — A4151 Sepsis due to Escherichia coli [E. coli]: Secondary | ICD-10-CM | POA: Diagnosis present

## 2024-07-29 DIAGNOSIS — E870 Hyperosmolality and hypernatremia: Secondary | ICD-10-CM | POA: Diagnosis present

## 2024-07-29 DIAGNOSIS — R509 Fever, unspecified: Secondary | ICD-10-CM | POA: Diagnosis not present

## 2024-07-29 DIAGNOSIS — E039 Hypothyroidism, unspecified: Secondary | ICD-10-CM | POA: Diagnosis present

## 2024-07-29 DIAGNOSIS — E871 Hypo-osmolality and hyponatremia: Secondary | ICD-10-CM | POA: Diagnosis present

## 2024-07-29 DIAGNOSIS — E11649 Type 2 diabetes mellitus with hypoglycemia without coma: Secondary | ICD-10-CM | POA: Diagnosis present

## 2024-07-29 DIAGNOSIS — N3000 Acute cystitis without hematuria: Secondary | ICD-10-CM | POA: Diagnosis present

## 2024-07-29 DIAGNOSIS — N179 Acute kidney failure, unspecified: Secondary | ICD-10-CM | POA: Diagnosis present

## 2024-07-29 DIAGNOSIS — Z8673 Personal history of transient ischemic attack (TIA), and cerebral infarction without residual deficits: Secondary | ICD-10-CM | POA: Diagnosis not present

## 2024-07-29 DIAGNOSIS — M797 Fibromyalgia: Secondary | ICD-10-CM | POA: Diagnosis present

## 2024-07-29 DIAGNOSIS — E860 Dehydration: Secondary | ICD-10-CM | POA: Diagnosis present

## 2024-07-29 DIAGNOSIS — E1165 Type 2 diabetes mellitus with hyperglycemia: Secondary | ICD-10-CM | POA: Diagnosis present

## 2024-07-29 DIAGNOSIS — R6520 Severe sepsis without septic shock: Secondary | ICD-10-CM | POA: Diagnosis present

## 2024-07-29 DIAGNOSIS — I10 Essential (primary) hypertension: Secondary | ICD-10-CM | POA: Diagnosis present

## 2024-07-29 DIAGNOSIS — E876 Hypokalemia: Secondary | ICD-10-CM | POA: Diagnosis present

## 2024-07-29 DIAGNOSIS — Z79890 Hormone replacement therapy (postmenopausal): Secondary | ICD-10-CM | POA: Diagnosis not present

## 2024-07-29 DIAGNOSIS — E785 Hyperlipidemia, unspecified: Secondary | ICD-10-CM | POA: Diagnosis present

## 2024-07-29 DIAGNOSIS — E872 Acidosis, unspecified: Secondary | ICD-10-CM | POA: Diagnosis present

## 2024-07-29 DIAGNOSIS — E11319 Type 2 diabetes mellitus with unspecified diabetic retinopathy without macular edema: Secondary | ICD-10-CM | POA: Diagnosis present

## 2024-07-29 DIAGNOSIS — Z1612 Extended spectrum beta lactamase (ESBL) resistance: Secondary | ICD-10-CM | POA: Diagnosis present

## 2024-07-29 DIAGNOSIS — Z794 Long term (current) use of insulin: Secondary | ICD-10-CM | POA: Diagnosis not present

## 2024-07-29 DIAGNOSIS — E861 Hypovolemia: Secondary | ICD-10-CM | POA: Diagnosis present

## 2024-07-29 DIAGNOSIS — F32A Depression, unspecified: Secondary | ICD-10-CM | POA: Diagnosis present

## 2024-07-29 LAB — CBC
HCT: 38.8 % (ref 36.0–46.0)
Hemoglobin: 11.8 g/dL — ABNORMAL LOW (ref 12.0–15.0)
MCH: 28.4 pg (ref 26.0–34.0)
MCHC: 30.4 g/dL (ref 30.0–36.0)
MCV: 93.5 fL (ref 80.0–100.0)
Platelets: 304 K/uL (ref 150–400)
RBC: 4.15 MIL/uL (ref 3.87–5.11)
RDW: 14.9 % (ref 11.5–15.5)
WBC: 10.8 K/uL — ABNORMAL HIGH (ref 4.0–10.5)
nRBC: 0 % (ref 0.0–0.2)

## 2024-07-29 LAB — GLUCOSE, CAPILLARY
Glucose-Capillary: 271 mg/dL — ABNORMAL HIGH (ref 70–99)
Glucose-Capillary: 281 mg/dL — ABNORMAL HIGH (ref 70–99)
Glucose-Capillary: 292 mg/dL — ABNORMAL HIGH (ref 70–99)
Glucose-Capillary: 292 mg/dL — ABNORMAL HIGH (ref 70–99)
Glucose-Capillary: 326 mg/dL — ABNORMAL HIGH (ref 70–99)

## 2024-07-29 LAB — MAGNESIUM: Magnesium: 2.4 mg/dL (ref 1.7–2.4)

## 2024-07-29 LAB — BASIC METABOLIC PANEL WITH GFR
Anion gap: 13 (ref 5–15)
BUN: 33 mg/dL — ABNORMAL HIGH (ref 8–23)
CO2: 23 mmol/L (ref 22–32)
Calcium: 9 mg/dL (ref 8.9–10.3)
Chloride: 117 mmol/L — ABNORMAL HIGH (ref 98–111)
Creatinine, Ser: 1.64 mg/dL — ABNORMAL HIGH (ref 0.44–1.00)
GFR, Estimated: 32 mL/min — ABNORMAL LOW (ref >60)
Glucose, Bld: 306 mg/dL — ABNORMAL HIGH (ref 70–99)
Potassium: 3.7 mmol/L (ref 3.5–5.1)
Sodium: 153 mmol/L — ABNORMAL HIGH (ref 135–145)

## 2024-07-29 LAB — PHOSPHORUS: Phosphorus: 3.7 mg/dL (ref 2.5–4.6)

## 2024-07-29 LAB — HEMOGLOBIN A1C
Hgb A1c MFr Bld: 8.3 % — ABNORMAL HIGH (ref 4.8–5.6)
Mean Plasma Glucose: 191.51 mg/dL

## 2024-07-29 MED ORDER — INSULIN GLARGINE-YFGN 100 UNIT/ML ~~LOC~~ SOLN: 6.0000 [IU] | Freq: Two times a day (BID) | SUBCUTANEOUS | Status: DC

## 2024-07-29 MED ORDER — GABAPENTIN 300 MG PO CAPS
300.0000 mg | ORAL_CAPSULE | Freq: Every day | ORAL | Status: DC
  Administered 2024-07-29 – 2024-08-07 (×11): 300 mg via ORAL
  Filled 2024-07-29 (×11): qty 1

## 2024-07-29 MED ORDER — MELATONIN 3 MG PO TABS: 6.0000 mg | ORAL_TABLET | Freq: Every evening | ORAL | Status: DC | PRN

## 2024-07-29 MED ORDER — POLYETHYLENE GLYCOL 3350 17 G PO PACK: 17.0000 g | PACK | Freq: Every day | ORAL | Status: DC | PRN

## 2024-07-29 MED ORDER — MELATONIN 5 MG PO TABS: 5.0000 mg | ORAL_TABLET | Freq: Every evening | ORAL | Status: DC | PRN

## 2024-07-29 MED ORDER — INSULIN ASPART 100 UNIT/ML IJ SOLN
0.0000 [IU] | Freq: Every day | INTRAMUSCULAR | Status: DC
  Administered 2024-07-29: 3 [IU] via SUBCUTANEOUS
  Administered 2024-07-30: 5 [IU] via SUBCUTANEOUS
  Administered 2024-07-31 – 2024-08-02 (×2): 3 [IU] via SUBCUTANEOUS
  Administered 2024-08-02: 5 [IU] via SUBCUTANEOUS
  Administered 2024-08-04: 22:00:00 3 [IU] via SUBCUTANEOUS
  Administered 2024-08-05: 22:00:00 2 [IU] via SUBCUTANEOUS
  Administered 2024-08-06 – 2024-08-07 (×2): 3 [IU] via SUBCUTANEOUS
  Filled 2024-07-29 (×9): qty 1

## 2024-07-29 MED ORDER — INSULIN GLARGINE-YFGN 100 UNIT/ML ~~LOC~~ SOLN
3.0000 [IU] | Freq: Two times a day (BID) | SUBCUTANEOUS | Status: DC
  Administered 2024-07-29: 3 [IU] via SUBCUTANEOUS
  Filled 2024-07-29 (×3): qty 0.03

## 2024-07-29 MED ORDER — ATORVASTATIN CALCIUM 10 MG PO TABS
10.0000 mg | ORAL_TABLET | Freq: Every evening | ORAL | Status: DC
  Administered 2024-07-29 – 2024-08-07 (×10): 10 mg via ORAL
  Filled 2024-07-29 (×10): qty 1

## 2024-07-29 MED ORDER — DEXTROSE 5 % IV SOLN: INTRAVENOUS | Status: AC

## 2024-07-29 MED ORDER — LEVOTHYROXINE SODIUM 50 MCG PO TABS
50.0000 ug | ORAL_TABLET | Freq: Every day | ORAL | Status: DC
  Administered 2024-07-29 – 2024-08-08 (×11): 50 ug via ORAL
  Filled 2024-07-29 (×11): qty 1

## 2024-07-29 MED ORDER — ENOXAPARIN SODIUM 30 MG/0.3ML IJ SOSY
30.0000 mg | PREFILLED_SYRINGE | INTRAMUSCULAR | Status: DC
  Administered 2024-07-29 – 2024-08-01 (×4): 30 mg via SUBCUTANEOUS
  Filled 2024-07-29 (×4): qty 0.3

## 2024-07-29 MED ORDER — SERTRALINE HCL 50 MG PO TABS
50.0000 mg | ORAL_TABLET | Freq: Every day | ORAL | Status: DC
  Administered 2024-07-29 – 2024-08-08 (×10): 50 mg via ORAL
  Filled 2024-07-29 (×10): qty 1

## 2024-07-29 MED ORDER — PROCHLORPERAZINE EDISYLATE 10 MG/2ML IJ SOLN: 5.0000 mg | Freq: Four times a day (QID) | INTRAMUSCULAR | Status: DC | PRN

## 2024-07-29 MED ORDER — ACETAMINOPHEN 500 MG PO TABS
500.0000 mg | ORAL_TABLET | Freq: Four times a day (QID) | ORAL | Status: DC | PRN
  Administered 2024-07-30 – 2024-08-06 (×3): 500 mg via ORAL
  Filled 2024-07-29 (×3): qty 1

## 2024-07-29 MED ORDER — SODIUM CHLORIDE 0.45 % IV SOLN: INTRAVENOUS | Status: DC

## 2024-07-29 MED ORDER — INSULIN GLARGINE 100 UNIT/ML ~~LOC~~ SOLN
6.0000 [IU] | Freq: Two times a day (BID) | SUBCUTANEOUS | Status: DC
  Administered 2024-07-29: 6 [IU] via SUBCUTANEOUS
  Filled 2024-07-29 (×4): qty 0.06

## 2024-07-29 MED ORDER — SODIUM CHLORIDE 0.9 % IV SOLN
2.0000 g | INTRAVENOUS | Status: DC
  Administered 2024-07-29 – 2024-07-30 (×2): 2 g via INTRAVENOUS
  Filled 2024-07-29 (×2): qty 20

## 2024-07-29 MED ORDER — CLOPIDOGREL BISULFATE 75 MG PO TABS
75.0000 mg | ORAL_TABLET | Freq: Every day | ORAL | Status: DC
  Administered 2024-07-29 – 2024-08-03 (×6): 75 mg via ORAL
  Filled 2024-07-29 (×6): qty 1

## 2024-07-29 MED ORDER — INSULIN ASPART 100 UNIT/ML IJ SOLN
0.0000 [IU] | Freq: Three times a day (TID) | INTRAMUSCULAR | Status: DC
  Administered 2024-07-29 (×2): 5 [IU] via SUBCUTANEOUS
  Administered 2024-07-29: 7 [IU] via SUBCUTANEOUS
  Administered 2024-07-30 (×2): 9 [IU] via SUBCUTANEOUS
  Administered 2024-07-30: 7 [IU] via SUBCUTANEOUS
  Administered 2024-07-31: 5 [IU] via SUBCUTANEOUS
  Administered 2024-07-31: 3 [IU] via SUBCUTANEOUS
  Administered 2024-07-31: 5 [IU] via SUBCUTANEOUS
  Administered 2024-08-01: 9 [IU] via SUBCUTANEOUS
  Administered 2024-08-01: 7 [IU] via SUBCUTANEOUS
  Administered 2024-08-02 (×3): 2 [IU] via SUBCUTANEOUS
  Administered 2024-08-03: 09:00:00 3 [IU] via SUBCUTANEOUS
  Administered 2024-08-03: 12:00:00 2 [IU] via SUBCUTANEOUS
  Administered 2024-08-04: 12:00:00 1 [IU] via SUBCUTANEOUS
  Administered 2024-08-04: 18:00:00 5 [IU] via SUBCUTANEOUS
  Administered 2024-08-05 (×2): 2 [IU] via SUBCUTANEOUS
  Administered 2024-08-05: 12:00:00 1 [IU] via SUBCUTANEOUS
  Administered 2024-08-06: 17:00:00 7 [IU] via SUBCUTANEOUS
  Administered 2024-08-06: 09:00:00 2 [IU] via SUBCUTANEOUS
  Administered 2024-08-07: 7 [IU] via SUBCUTANEOUS
  Administered 2024-08-07: 9 [IU] via SUBCUTANEOUS
  Administered 2024-08-08: 2 [IU] via SUBCUTANEOUS
  Filled 2024-07-29 (×23): qty 1

## 2024-07-29 NOTE — Evaluation (Signed)
 Physical Therapy Evaluation Patient Details Name: Dawn Ho MRN: [id number] DOB: 1951/02/19 Today's Date: 07/29/2024  History of Present Illness  Dawn Ho is a 73 y.o. female with medical history significant for CVA with left hemiparesis, type 2 diabetes, hypertension, hyperlipidemia, chronic anxiety/depression, fibromyalgia, bilateral staghorn calculi, who presents to the ER from SNF due to increased fatigue for the past few days.  The patient is minimally verbal.  EMS was activated.  Upon EMS arrival the patient was hyperglycemic with CBG in the 300s.     In the ER, alert and confused, hypertensive and tachypneic.  Sinus tachycardia with rate of 116 and QTc of 487 on 12-lead EKG.       Lab studies notable for serum sodium of 149, bicarb 19, glucose 306, anion gap 17, GFR 30, BUN 34 and creatinine 1.73.  UA positive for pyuria.   Clinical Impression  Patient appears agreeable to OT/PT co-evaluation. Patient is unable to verbally express herself this date. Per previous documentation, pt is from a SNF, w/c level at baseline, and requires assist for all mobility, transfers, and ADLs. This date, pt requires max/total assist for bed mobility. Pt demonstrates extensor tone and little active motion in BLE while supine in bed. Pt is able to actively move RUE against gravity some but still with limitations. Appears to have no AROM in LUE and passive ROM is painful. Pt remains in bed at end of session, all needs met, call button in reach, and nursing staff notified of possible feeding needs. Patient will benefit from continued skilled physical therapy acutely and in recommended venue in order to address current deficits and improve overall function.        If plan is discharge home, recommend the following: Two people to help with walking and/or transfers;A lot of help with bathing/dressing/bathroom;Assistance with feeding   Can travel by private vehicle        Equipment Recommendations None  recommended by PT  Recommendations for Other Services       Functional Status Assessment Patient has had a recent decline in their functional status and demonstrates the ability to make significant improvements in function in a reasonable and predictable amount of time.     Precautions / Restrictions Precautions Precautions: Fall Recall of Precautions/Restrictions: Impaired Restrictions Weight Bearing Restrictions Per Provider Order: No      Mobility  Bed Mobility Overal bed mobility: Needs Assistance Bed Mobility: Supine to Sit, Sit to Supine     Supine to sit: Max assist, Total assist Sit to supine: Max assist, Total assist   General bed mobility comments: max assist x1 this date for supine<>sit for LE management and trunk control, pt with little active assist and groans with pain    Transfers                   General transfer comment: unsafe to perform this date, pt at w/c level at baseline    Ambulation/Gait               General Gait Details: unsafe to perform this date, pt at w/c level at baseline  Stairs            Wheelchair Mobility     Tilt Bed    Modified Rankin (Stroke Patients Only)       Balance Overall balance assessment: Needs assistance Sitting-balance support: Feet supported, Single extremity supported Sitting balance-Leahy Scale: Zero Sitting balance - Comments: pt unable to perform seated balance independently this  date       Standing balance comment: unsafe to perform this date, pt at w/c level at baseline                             Pertinent Vitals/Pain Pain Assessment Pain Assessment: Faces Faces Pain Scale: Hurts little more Pain Location: Pain in LUE with PROM, inc discomfort during bed mobility Pain Descriptors / Indicators: Moaning, Grimacing Pain Intervention(s): Limited activity within patient's tolerance, Repositioned, Monitored during session    Home Living Family/patient expects to be  discharged to:: Skilled nursing facility                   Additional Comments: Pt from [facility]. Unable to verbally report history due to aphasia.    Prior Function Prior Level of Function : Needs assist       Physical Assist : Mobility (physical);ADLs (physical) Mobility (physical): Bed mobility;Gait;Transfers ADLs (physical): Bathing;Dressing;Toileting Mobility Comments: pt unable to provide PLOF. Current LOF would suggest, pt requires max assist for mobility at baseline. per previous note, pt assisted with transfers, uses w/c for mobility ADLs Comments: Assisted by staff at SNF per previous note     Extremity/Trunk Assessment   Upper Extremity Assessment Upper Extremity Assessment: Defer to OT evaluation    Lower Extremity Assessment Lower Extremity Assessment: Generalized weakness;RLE deficits/detail;LLE deficits/detail RLE Deficits / Details: pt unable to perform active movements with RLE while in bed, unable to hold limb against gravity, noted extensor tone in BLE RLE Coordination: decreased gross motor LLE Deficits / Details: Pt able to slightly lift LLE from bed when verbally cued, but still remains severely limited, noted extensor tone in BLE LLE Coordination: decreased gross motor    Cervical / Trunk Assessment Cervical / Trunk Assessment: Kyphotic  Communication   Communication Communication: Impaired Factors Affecting Communication: Difficulty expressing self;Reduced clarity of speech    Cognition Arousal: Alert Behavior During Therapy: WFL for tasks assessed/performed   PT - Cognitive impairments: Difficult to assess, History of cognitive impairments Difficult to assess due to: Impaired communication                       Following commands: Intact       Cueing Cueing Techniques: Verbal cues, Tactile cues, Visual cues, Gestural cues     General Comments      Exercises     Assessment/Plan    PT Assessment Patient needs  continued PT services;All further PT needs can be met in the next venue of care  PT Problem List Decreased strength;Decreased range of motion;Decreased cognition;Decreased activity tolerance;Decreased balance;Decreased mobility;Pain;Impaired tone       PT Treatment Interventions Functional mobility training;Therapeutic activities;Therapeutic exercise;Balance training;Neuromuscular re-education;Patient/family education;Wheelchair mobility training    PT Goals (Current goals can be found in the Care Plan section)  Acute Rehab PT Goals Patient Stated Goal: pt unable to verbally express, would rec return to SNF with therapy services PT Goal Formulation: With patient Time For Goal Achievement: 08/05/24 Potential to Achieve Goals: Good    Frequency Min 3X/week     Co-evaluation PT/OT/SLP Co-Evaluation/Treatment: Yes Reason for Co-Treatment: To address functional/ADL transfers PT goals addressed during session: Mobility/safety with mobility         AM-PAC PT 6 Clicks Mobility  Outcome Measure Help needed turning from your back to your side while in a flat bed without using bedrails?: A Lot Help needed moving from lying on  your back to sitting on the side of a flat bed without using bedrails?: A Lot Help needed moving to and from a bed to a chair (including a wheelchair)?: A Lot Help needed standing up from a chair using your arms (e.g., wheelchair or bedside chair)?: Total Help needed to walk in hospital room?: Total Help needed climbing 3-5 steps with a railing? : Total 6 Click Score: 9    End of Session   Activity Tolerance: Patient limited by pain Patient left: in bed;with call bell/phone within reach   PT Visit Diagnosis: Other abnormalities of gait and mobility (R26.89);Muscle weakness (generalized) (M62.81);Hemiplegia and hemiparesis Hemiplegia - Right/Left: Left Hemiplegia - caused by: Cerebral infarction    Time: 9181-9172 PT Time Calculation (min) (ACUTE ONLY): 9  min   Charges:   PT Evaluation $PT Eval Low Complexity: 1 Low   PT General Charges $$ ACUTE PT VISIT: 1 Visit         10:48 AM, 07/29/24 Rosaria Settler, PT, DPT [place] with [facility]

## 2024-07-29 NOTE — Progress Notes (Signed)
 PROGRESS NOTE    Dawn Ho  FMW:[id number] DOB: [birth year] DOA: 07/28/2024 PCP: Celine Erla Bong, MD   Brief Narrative:   Dawn Ho is a 73 y.o. female with medical history significant for CVA with left hemiparesis, type 2 diabetes, hypertension, hyperlipidemia, chronic anxiety/depression, fibromyalgia, bilateral staghorn calculi, who presents to the ER from SNF due to increased fatigue for the past few days.  The patient is minimally verbal.  EMS was activated.  Upon EMS arrival the patient was hyperglycemic with CBG in the 300s.   In the ER, alert and confused, hypertensive and tachypneic.  Sinus tachycardia with rate of 116 and QTc of 487 on 12-lead EKG.     Lab studies notable for serum sodium of 149, bicarb 19, glucose 306, anion gap 17, GFR 30, BUN 34 and creatinine 1.73.  UA positive for pyuria.   The patient received 1 dose of Rocephin , 1 L IV fluid bolus NS x 1 in the ER.  TRH, hospitalist service, was asked to admit for hypernatremia secondary to dehydration, AKI, and presumed UTI.   ED Course: Temperature 98.  BP 136/77, pulse 85, respiratory rate 22, O2 saturation 93% on room air.  Assessment & Plan:   Principal Problem:   AKI (acute kidney injury) Active Problems:   Diabetes mellitus (HCC)   History of TIAs   H/O: CVA (cerebrovascular accident)   Hypernatremia   Dehydration   Hypothyroidism   AKI, prerenal in the setting of dehydration from poor oral intake. Patient from SNF. At baseline creatinine 0.7 with GFR greater than 60. Presented with creatinine of 1.73, BUN 34, GFR 30. Mild improvement overnight. Will switch fluid to D5W due to worsening hypernatremia   High anion gap metabolic acidosis in the setting of acute renal insufficiency Will continue IV fluid as above   hypernatremia, hypovolemic in the setting of poor oral intake. Presented with serum sodium of 149 and chloride 113 Sodium worsened to 153 today. Will start D5W with close  monitoring of blood sugars and insulin  therapy  Presumed UTI, POA UA positive for pyuria Follow urine culture and peripheral blood cultures x 2 Continue Rocephin  De-escalate antibiotics as able Monitor fever curve and WBCs   Type 2 diabetes with hyperglycemia Presented with serum glucose of 306 Last hemoglobin A1c 8.3 on 06/04/2023 Heart healthy carb modified diet Insulin  coverage, long-acting and short acting.  Increase Lantus  to 6 units twice daily   Generalized weakness PT OT evaluation Fall precautions.   History of prior CVA Resume home regimen Prior to admission on Plavix  and Lipitor.   Hypothyroidism Resume home levothyroxine .   Chronic anxiety/depression Resume home regimen.     Time: 75 minutes.     DVT prophylaxis: Subcu Lovenox  daily.   Code Status: Full code.   Family Communication: None at bedside.   Disposition Plan: Admitted to telemetry unit.   Consults called: None.      Status is: Observation The patient will require care spanning > 2 midnights and should be moved to inpatient because: Continued need for IV fluids due to worsening hypernatremia and AKI  Subjective:  Patient seen and examined at the bedside.  She is alert and oriented x 1.  Was able to tell her name.  Did not answer to aother questions.  Objective: Vitals:   07/28/24 2330 07/29/24 0000 07/29/24 0053 07/29/24 0444  BP: (!) 148/94 (!) 153/87 136/77 (!) 161/86  Pulse: 91 86 85 81  Resp: (!) 23 (!) 22 18 18  Temp:   98 F (36.7 C) 98.1 F (36.7 C)  TempSrc:      SpO2: 93% 93% 93% 94%  Weight:   63.4 kg   Height:   5' (1.524 m)     Intake/Output Summary (Last 24 hours) at 07/29/2024 1405 Last data filed at 07/29/2024 0500 Gross per 24 hour  Intake 240 ml  Output 200 ml  Net 40 ml   Filed Weights   07/28/24 1927 07/29/24 0053  Weight: 74 kg 63.4 kg    Examination:  General exam: Alert, confused Respiratory system: Bilateral decreased breath sounds at  bases Cardiovascular system: S1 & S2 heard, Rate controlled Gastrointestinal system: Abdomen is nondistended, soft and nontender. Normal bowel sounds heard. Extremities: No edema Central nervous system: Alert and oriented x 1, cranial nerves grossly intact, answers very few questions, does not follow commands   Data Reviewed: I have personally reviewed following labs and imaging studies  CBC: Recent Labs  Lab 07/28/24 1954 07/29/24 0509  WBC 9.1 10.8*  NEUTROABS 6.6  --   HGB 12.6 11.8*  HCT 40.4 38.8  MCV 91.6 93.5  PLT 343 304   Basic Metabolic Panel: Recent Labs  Lab 07/28/24 1954 07/29/24 0509  NA 149* 153*  K 4.0 3.7  CL 113* 117*  CO2 19* 23  GLUCOSE 306* 306*  BUN 34* 33*  CREATININE 1.73* 1.64*  CALCIUM  9.5 9.0  MG  --  2.4  PHOS  --  3.7   GFR: Estimated Creatinine Clearance: 25 mL/min (A) (by C-G formula based on SCr of 1.64 mg/dL (H)). Liver Function Tests: Recent Labs  Lab 07/28/24 1954  AST 13*  ALT 10  ALKPHOS 103  BILITOT 0.3  PROT 7.8  ALBUMIN 3.5   No results for input(s): LIPASE, AMYLASE in the last 168 hours. No results for input(s): AMMONIA in the last 168 hours. Coagulation Profile: Recent Labs  Lab 07/28/24 1954  INR 1.1   Cardiac Enzymes: No results for input(s): CKTOTAL, CKMB, CKMBINDEX, TROPONINI in the last 168 hours. BNP (last 3 results) No results for input(s): PROBNP in the last 8760 hours. HbA1C: Recent Labs    07/28/24 1954  HGBA1C 8.3*   CBG: Recent Labs  Lab 07/28/24 1925 07/29/24 0056 07/29/24 0759 07/29/24 1129  GLUCAP 306* 271* 292* 326*   Lipid Profile: No results for input(s): CHOL, HDL, LDLCALC, TRIG, CHOLHDL, LDLDIRECT in the last 72 hours. Thyroid  Function Tests: No results for input(s): TSH, T4TOTAL, FREET4, T3FREE, THYROIDAB in the last 72 hours. Anemia Panel: No results for input(s): VITAMINB12, FOLATE, FERRITIN, TIBC, IRON, RETICCTPCT in the  last 72 hours. Sepsis Labs: Recent Labs  Lab 07/28/24 1954  LATICACIDVEN 1.0    Recent Results (from the past 240 hours)  Culture, blood (Routine x 2)     Status: None (Preliminary result)   Collection Time: 07/28/24  7:54 PM   Specimen: BLOOD  Result Value Ref Range Status   Specimen Description BLOOD BLOOD RIGHT HAND  Final   Special Requests   Final    BOTTLES DRAWN AEROBIC AND ANAEROBIC Blood Culture adequate volume   Culture   Final    NO GROWTH < 12 HOURS Performed at [facility], [address], [place], [place] [address]    Report Status PENDING  Incomplete  Culture, blood (Routine x 2)     Status: None (Preliminary result)   Collection Time: 07/28/24  8:22 PM   Specimen: BLOOD  Result Value Ref Range Status  Specimen Description BLOOD BLOOD LEFT HAND  Final   Special Requests   Final    AEROBIC BOTTLE ONLY Blood Culture results may not be optimal due to an inadequate volume of blood received in culture bottles   Culture   Final    NO GROWTH < 12 HOURS Performed at [facility], [address] [place], [place] [address]    Report Status PENDING  Incomplete         Radiology Studies: DG Chest Port 1 View if patient is in a treatment room. Result Date: 07/28/2024 CLINICAL DATA:  Suspected sepsis. EXAM: PORTABLE CHEST 1 VIEW COMPARISON:  Chest radiograph dated 06/15/2023. FINDINGS: No focal consolidation, pleural effusion, pneumothorax. Stable cardiac silhouette. Loop recorder device. No acute osseous pathology. IMPRESSION: No active disease. Electronically Signed   By: Vanetta Chou M.D.   On: 07/28/2024 20:01        Scheduled Meds:  atorvastatin   10 mg Oral QPM   clopidogrel   75 mg Oral Daily   enoxaparin  (LOVENOX ) injection  30 mg Subcutaneous Q24H   gabapentin   300 mg Oral QHS   insulin  aspart  0-5 Units Subcutaneous QHS   insulin  aspart  0-9 Units Subcutaneous TID WC   insulin  glargine  6 Units Subcutaneous BID   levothyroxine   50 mcg Oral Q0600    sertraline   50 mg Oral Daily   Continuous Infusions:  cefTRIAXone  (ROCEPHIN )  IV     dextrose             Derryl Duval, MD [facility] 07/29/2024, 2:05 PM

## 2024-07-29 NOTE — Evaluation (Signed)
 Occupational Therapy Evaluation Patient Details Name: Dawn Ho MRN: [id number] DOB: Aug 05, 1951 Today's Date: 07/29/2024   History of Present Illness   Dawn Ho is a 73 y.o. female with medical history significant for CVA with left hemiparesis, type 2 diabetes, hypertension, hyperlipidemia, chronic anxiety/depression, fibromyalgia, bilateral staghorn calculi, who presents to the ER from SNF due to increased fatigue for the past few days.  The patient is minimally verbal.  EMS was activated.  Upon EMS arrival the patient was hyperglycemic with CBG in the 300s.     In the ER, alert and confused, hypertensive and tachypneic.  Sinus tachycardia with rate of 116 and QTc of 487 on 12-lead EKG.       Lab studies notable for serum sodium of 149, bicarb 19, glucose 306, anion gap 17, GFR 30, BUN 34 and creatinine 1.73.  UA positive for pyuria.     Clinical Impressions Pt agreeable to OT and PT co-evaluation. Pt unable to functionally communicate today. Pt required max to total assist for bed mobility with inability to maintain sitting balance at EOB without assist. Pt at level of max to total assist for ADL's based on clinical judgement. L resting hand splint adjusted for better fit during session. Pt left back in bed with call bell within reach. Pt will benefit from continued OT in the hospital to increase strength, balance, and endurance for safe ADL's.         If plan is discharge home, recommend the following:   Two people to help with walking and/or transfers;A lot of help with bathing/dressing/bathroom;Assistance with cooking/housework;Assistance with feeding;Direct supervision/assist for medications management;Direct supervision/assist for financial management;Assist for transportation;Help with stairs or ramp for entrance     Functional Status Assessment   Patient has had a recent decline in their functional status and/or demonstrates limited ability to make significant improvements  in function in a reasonable and predictable amount of time     Equipment Recommendations   None recommended by OT             Precautions/Restrictions   Precautions Precautions: Fall Recall of Precautions/Restrictions: Impaired Restrictions Weight Bearing Restrictions Per Provider Order: No     Mobility Bed Mobility Overal bed mobility: Needs Assistance Bed Mobility: Supine to Sit, Sit to Supine     Supine to sit: Max assist, Total assist Sit to supine: Max assist, Total assist   General bed mobility comments: Poor tunk control; assist to manage B LE into and out of bed.    Transfers                   General transfer comment: Not attempted due to pt inability to maintain sitting balance without assist.      Balance Overall balance assessment: Needs assistance Sitting-balance support: Feet supported, Single extremity supported Sitting balance-Leahy Scale: Zero Sitting balance - Comments: Unable to sit without physical assist at EOB.                                   ADL either performed or assessed with clinical judgement   ADL Overall ADL's : Needs assistance/impaired Eating/Feeding: Maximal assistance;Bed level   Grooming: Maximal assistance;Bed level   Upper Body Bathing: Maximal assistance;Bed level   Lower Body Bathing: Total assistance;Maximal assistance;Bed level   Upper Body Dressing : Maximal assistance;Bed level   Lower Body Dressing: Maximal assistance;Total assistance;Bed level   Toilet Transfer: Total  assistance Toilet Transfer Details (indicate cue type and reason): Not directly observed, based on poor sitting balance. Toileting- Clothing Manipulation and Hygiene: Total assistance;Bed level               Vision Patient Visual Report:  (Pt unable to give vision history.) Vision Assessment?: No apparent visual deficits Additional Comments: Futher observation in functional context needed. No obvious deficits  today.     Perception Perception: Not tested       Praxis Praxis: Not tested       Pertinent Vitals/Pain Pain Assessment Pain Assessment: Faces Faces Pain Scale: Hurts little more Pain Location: Pain in LUE with PROM, general pain during bed mobility. Pain Descriptors / Indicators: Moaning, Grimacing Pain Intervention(s): Limited activity within patient's tolerance, Monitored during session, Repositioned     Extremity/Trunk Assessment Upper Extremity Assessment Upper Extremity Assessment: RUE deficits/detail;LUE deficits/detail RUE Deficits / Details: Shoulder flexion limited to <75% available range. Difficult to fullly assess given pt's cognitive status. Near full P/ROM for elbow flexion/extension. 3+/5 gross grasp. LUE Deficits / Details: L hemiparisis at baseline. L hand in resting hand splint that was adjusted for better fit by this OT.   Lower Extremity Assessment Lower Extremity Assessment: Defer to PT evaluation RLE Deficits / Details: pt unable to perform active movements with RLE while in bed, unable to hold limb against gravity, noted extensor tone in BLE RLE Coordination: decreased gross motor LLE Deficits / Details: Pt able to slightly lift LLE from bed when verbally cued, but still remains severely limited, noted extensor tone in BLE LLE Coordination: decreased gross motor   Cervical / Trunk Assessment Cervical / Trunk Assessment: Kyphotic   Communication Communication Communication: Impaired Factors Affecting Communication: Difficulty expressing self;Reduced clarity of speech   Cognition Arousal: Alert Behavior During Therapy: WFL for tasks assessed/performed Cognition: No apparent impairments                               Following commands: Impaired Following commands impaired: Follows one step commands inconsistently     Cueing  General Comments   Cueing Techniques: Verbal cues;Tactile cues;Visual cues;Gestural cues                  Home Living Family/patient expects to be discharged to:: Skilled nursing facility                                 Additional Comments: Pt from [facility]. Pt only saying a couple words today. No functional communication.      Prior Functioning/Environment Prior Level of Function : Needs assist;Patient poor historian/Family not available       Physical Assist : Mobility (physical);ADLs (physical) Mobility (physical): Bed mobility;Gait;Transfers ADLs (physical): Bathing;Dressing;Toileting;Grooming;Feeding;IADLs Mobility Comments: Pt unable to provide history. Chart review indicates assist needed for transfer to w/c with w/c used for mobility. ADLs Comments: Chart indicates assist for ADL's by SNF staff.    OT Problem List: Decreased strength;Decreased range of motion;Decreased activity tolerance;Impaired balance (sitting and/or standing);Decreased coordination;Decreased cognition   OT Treatment/Interventions: Self-care/ADL training;Therapeutic exercise;DME and/or AE instruction;Therapeutic activities;Cognitive remediation/compensation;Visual/perceptual remediation/compensation;Patient/family education;Splinting;Balance training      OT Goals(Current goals can be found in the care plan section)   Acute Rehab OT Goals Patient Stated Goal: none stated OT Goal Formulation: Patient unable to participate in goal setting Time For Goal Achievement: 08/12/24 Potential to Achieve Goals: Fair  OT Frequency:  Min 1X/week    Co-evaluation PT/OT/SLP Co-Evaluation/Treatment: Yes Reason for Co-Treatment: To address functional/ADL transfers;Complexity of the patient's impairments (multi-system involvement) PT goals addressed during session: Mobility/safety with mobility OT goals addressed during session: ADL's and self-care      AM-PAC OT 6 Clicks Daily Activity     Outcome Measure Help from another person eating meals?: A Lot Help from another person taking  care of personal grooming?: A Lot Help from another person toileting, which includes using toliet, bedpan, or urinal?: Total Help from another person bathing (including washing, rinsing, drying)?: Total Help from another person to put on and taking off regular upper body clothing?: A Lot Help from another person to put on and taking off regular lower body clothing?: Total 6 Click Score: 9   End of Session    Activity Tolerance: Patient tolerated treatment well Patient left: in bed;with call bell/phone within reach  OT Visit Diagnosis: Unsteadiness on feet (R26.81);Other abnormalities of gait and mobility (R26.89);Muscle weakness (generalized) (M62.81)                Time: 9182-9170 OT Time Calculation (min): 12 min Charges:  OT General Charges $OT Visit: 1 Visit OT Evaluation $OT Eval Low Complexity: 1 Low  Jaquavian Firkus OT, MOT   Jayson Person 07/29/2024, 11:16 AM

## 2024-07-29 NOTE — Plan of Care (Signed)

## 2024-07-29 NOTE — Plan of Care (Signed)
°  Problem: Acute Rehab OT Goals (only OT should resolve) Goal: Pt. Will Perform Eating Flowsheets (Taken 07/29/2024 1119) Pt Will Perform Eating:  with set-up  sitting Goal: Pt. Will Perform Grooming Flowsheets (Taken 07/29/2024 1119) Pt Will Perform Grooming:  with contact guard assist  with min assist  sitting Goal: Pt. Will Perform Upper Body Bathing Flowsheets (Taken 07/29/2024 1119) Pt Will Perform Upper Body Bathing:  with min assist  with mod assist  sitting Goal: Pt. Will Perform Upper Body Dressing Flowsheets (Taken 07/29/2024 1119) Pt Will Perform Upper Body Dressing:  with mod assist  with min assist  bed level  sitting Goal: Pt. Will Transfer To Toilet Flowsheets (Taken 07/29/2024 1119) Pt Will Transfer to Toilet:  with mod assist  stand pivot transfer Goal: Pt/Caregiver Will Perform Home Exercise Program Flowsheets (Taken 07/29/2024 1119) Pt/caregiver will Perform Home Exercise Program:  Increased ROM  Increased strength  Left upper extremity  Right Upper extremity  With minimal assist  Forrester Blando OT, MOT

## 2024-07-29 NOTE — ED Notes (Signed)
 Called third floor to inform pt will be coming up now

## 2024-07-29 NOTE — Progress Notes (Signed)
 When doing bedside swallow study. Patient tolerated po meds in applesauce without diff. No coughing or clearing throat observed. While drinking through straw. Patient would suck up liquid too fast which caused coughing. Writer instructed patient to take time with no further episodes of coughing. Was able to tolerate drinking from straw after encouragement and instruction. Brace noted to left hand to prevent further contractions. [facility].

## 2024-07-29 NOTE — Plan of Care (Signed)
°  Problem: Acute Rehab PT Goals(only PT should resolve) Goal: Pt Will Go Supine/Side To Sit Outcome: Progressing Flowsheets (Taken 07/29/2024 1050) Pt will go Supine/Side to Sit: with moderate assist Goal: Patient Will Perform Sitting Balance Outcome: Progressing Flowsheets (Taken 07/29/2024 1050) Patient will perform sitting balance: with moderate assist Goal: Pt Will Transfer Bed To Chair/Chair To Bed Outcome: Progressing Flowsheets (Taken 07/29/2024 1050) Pt will Transfer Bed to Chair/Chair to Bed: with mod assist    10:50 AM, 07/29/24 Rosaria Settler, PT, DPT [place] [facility]

## 2024-07-29 NOTE — TOC Initial Note (Signed)
 Transition of Care Dawn Ho) - Initial/Assessment Note    Patient Details  Name: Dawn Ho MRN: [id number] Date of Birth: [birth year]  Transition of Care Dawn Ho) CM/SW Contact:    Dawn DELENA Bigness, LCSW Phone Number: 07/29/2024, 10:59 AM  Clinical Narrative:                 Pt admitted from [facility] where she is a LTC resident. Pt is nonverbal at baseline and uses wheelchair for ambulation. Spoke with pt's son, Dawn Ho, and confirmed plan for pt to return to [facility] at discharge. CSW spoke with Dawn Ho at [facility] and confirmed pt is able to return at discharge.   Expected Discharge Plan: Long Term Nursing Home Barriers to Discharge: Continued Medical Work up   Patient Goals and CMS Choice Patient states their goals for this hospitalization and ongoing recovery are:: For pt to return to [facility]          Expected Discharge Plan and Services In-house Referral: Clinical Social Work Discharge Planning Services: NA Post Acute Care Choice: Skilled Nursing Facility, Resumption of Svcs/PTA Provider Living arrangements for the past 2 months: Skilled Nursing Facility                 DME Arranged: N/A DME Agency: NA                  Prior Living Arrangements/Services Living arrangements for the past 2 months: Skilled Nursing Facility Lives with:: Facility Resident Patient language and need for interpreter reviewed:: Yes Do you feel safe going back to the place where you live?: Yes      Need for Family Participation in Patient Care: Yes (Comment) Care giver support system in place?: Yes (comment) Current home services: DME (wheelchair) Criminal Activity/Legal Involvement Pertinent to Current Situation/Hospitalization: No - Comment as needed  Activities of Daily Living   ADL Screening (condition at time of admission) Independently performs ADLs?: No Does the patient have a NEW difficulty with bathing/dressing/toileting/self-feeding that is expected to last >3 days?: Yes  (Initiates electronic notice to provider for possible OT consult) Does the patient have a NEW difficulty with getting in/out of bed, walking, or climbing stairs that is expected to last >3 days?: Yes (Initiates electronic notice to provider for possible PT consult) Does the patient have a NEW difficulty with communication that is expected to last >3 days?: Yes (Initiates electronic notice to provider for possible SLP consult) Is the patient deaf or have difficulty hearing?: No Does the patient have difficulty seeing, even when wearing glasses/contacts?: Yes Does the patient have difficulty concentrating, remembering, or making decisions?: Yes  Permission Sought/Granted Permission sought to share information with : Family Supports, Magazine Features Editor                Emotional Assessment   Attitude/Demeanor/Rapport: Unable to Assess Affect (typically observed): Unable to Assess Orientation: : Oriented to Self Alcohol / Substance Use: Not Applicable Psych Involvement: No (comment)  Admission diagnosis:  Acute cystitis without hematuria [N30.00] AKI (acute kidney injury) [N17.9] Patient Active Problem List   Diagnosis Date Noted   Dehydration 06/16/2023   Hypothyroidism 06/16/2023   Fall 06/15/2023   Sepsis (HCC) 06/15/2023   Acute metabolic encephalopathy 06/04/2023   Uncontrolled type 2 diabetes mellitus with hyperglycemia, with long-term current use of insulin  (HCC) 06/04/2023   Hematuria 06/04/2023   Hypernatremia 06/04/2023   AKI (acute kidney injury) 06/04/2023   Nephrolithiasis 04/18/2022   Closed right hip fracture, initial encounter (HCC)  09/09/2020   Hypomagnesemia 11/12/2017   Aspiration into airway    Acute respiratory distress    Lung crackles    Ileus (HCC)    Abdominal distension    Leukocytosis    Diarrhea    UTI (urinary tract infection) 10/31/2017   Hypokalemia 10/31/2017   Cryptogenic stroke (HCC) 01/12/2015   Spastic hemiparesis (HCC)  01/12/2015   HLD (hyperlipidemia)    History of recent stroke    Accelerated hypertension    Acute cystitis without hematuria    Tachycardia    Stroke (HCC) 11/17/2014   Left-sided weakness 11/17/2014   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)    CVA (cerebral vascular accident) (HCC) 10/14/2014   TIA (transient ischemic attack) 10/13/2014   Chest pain 08/07/2011   Fibromyalgia 08/07/2011   Costochondritis 08/07/2011   Diabetes mellitus (HCC) 08/07/2011   Depression with anxiety 08/07/2011   Obesity 08/07/2011   History of TIAs 08/07/2011   Hyperlipidemia 08/07/2011   PCP:  Dawn Erla Bong, MD Pharmacy:   [facility] - [place], [place] - [address] [place] [address] Phone: [phone] Fax: [phone]  [facility] [place], [place] - [address] [place] [address] Phone: [phone] Fax: [phone]     Social Drivers of Health (SDOH) Social History: SDOH Screenings   Food Insecurity: Patient Unable To Answer (07/29/2024)  Housing: Unknown (07/29/2024)  Transportation Needs: Patient Unable To Answer (07/29/2024)  Utilities: Patient Unable To Answer (07/29/2024)  Social Connections: Patient Unable To Answer (07/29/2024)  Tobacco Use: Low Risk  (07/28/2024)   SDOH Interventions:     Readmission Risk Interventions    06/17/2023    3:42 PM  Readmission Risk Prevention Plan  Transportation Screening Complete  Home Care Screening Complete  Medication Review (RN CM) Complete

## 2024-07-29 NOTE — [place] FL2 (Signed)
 [place]  MEDICAID FL2 LEVEL OF CARE FORM     IDENTIFICATION  Patient Name: Dawn Ho Birthdate: Oct 07, 1950 Sex: female Admission Date (Current Location): 07/28/2024  [place] and [place] Number:  Raynaldo [id number] Facility and Address:  [facility],  [address], [place] [address]      Provider Number: [id number]  Attending Physician Name and Address:  Mcarthur Pick, MD  Relative Name and Phone Number:  Debby [name] Rhody)  [phone]    Current Level of Care: Hospital Recommended Level of Care: Skilled Nursing Facility Prior Approval Number:    Date Approved/Denied:   PASRR Number: [id number]  Discharge Plan: SNF    Current Diagnoses: Patient Active Problem List   Diagnosis Date Noted   Dehydration 06/16/2023   Hypothyroidism 06/16/2023   Fall 06/15/2023   Sepsis (HCC) 06/15/2023   Acute metabolic encephalopathy 06/04/2023   Uncontrolled type 2 diabetes mellitus with hyperglycemia, with long-term current use of insulin  (HCC) 06/04/2023   Hematuria 06/04/2023   Hypernatremia 06/04/2023   AKI (acute kidney injury) 06/04/2023   Nephrolithiasis 04/18/2022   Closed right hip fracture, initial encounter (HCC) 09/09/2020   Hypomagnesemia 11/12/2017   Aspiration into airway    Acute respiratory distress    Lung crackles    Ileus (HCC)    Abdominal distension    Leukocytosis    Diarrhea    UTI (urinary tract infection) 10/31/2017   Hypokalemia 10/31/2017   Cryptogenic stroke (HCC) 01/12/2015   Spastic hemiparesis (HCC) 01/12/2015   HLD (hyperlipidemia)    History of recent stroke    Accelerated hypertension    Acute cystitis without hematuria    Tachycardia    Stroke (HCC) 11/17/2014   Left-sided weakness 11/17/2014   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)    CVA (cerebral vascular accident) (HCC) 10/14/2014   TIA (transient ischemic attack) 10/13/2014   Chest pain 08/07/2011   Fibromyalgia 08/07/2011    Costochondritis 08/07/2011   Diabetes mellitus (HCC) 08/07/2011   Depression with anxiety 08/07/2011   Obesity 08/07/2011   History of TIAs 08/07/2011   Hyperlipidemia 08/07/2011    Orientation RESPIRATION BLADDER Height & Weight     Self  Normal Incontinent, External catheter Weight: 139 lb 12.4 oz (63.4 kg) Height:  5' (152.4 cm)  BEHAVIORAL SYMPTOMS/MOOD NEUROLOGICAL BOWEL NUTRITION STATUS      Incontinent Diet (See DC summary)  AMBULATORY STATUS COMMUNICATION OF NEEDS Skin   Total Care Non-Verbally Normal                       Personal Care Assistance Level of Assistance  Bathing, Feeding, Dressing Bathing Assistance: Maximum assistance Feeding assistance: Maximum assistance Dressing Assistance: Maximum assistance     Functional Limitations Info  Sight, Speech, Hearing Sight Info: Adequate Hearing Info: Adequate Speech Info: Impaired (whispers)    SPECIAL CARE FACTORS FREQUENCY  PT (By licensed PT), OT (By licensed OT)     PT Frequency: 5x/wk OT Frequency: 5x/wk            Contractures Contractures Info: Not present (Paralysis)    Additional Factors Info  Code Status, Allergies, Psychotropic, Insulin  Sliding Scale Code Status Info: FULL Allergies Info: Penicillins, Iodinated Contrast Media Psychotropic Info: Zoloft , Neurontin  Insulin  Sliding Scale Info: See MAR       Current Medications (07/29/2024):  This is the current hospital active medication list Current Facility-Administered Medications  Medication Dose Route Frequency Provider Last Rate Last Admin   acetaminophen  (TYLENOL ) tablet  500 mg  500 mg Oral Q6H PRN Shona Terry SAILOR, DO       atorvastatin  (LIPITOR) tablet 10 mg  10 mg Oral QPM Hall, Carole N, DO       cefTRIAXone  (ROCEPHIN ) 2 g in sodium chloride  0.9 % 100 mL IVPB  2 g Intravenous Q24H Hall, Carole N, DO       clopidogrel  (PLAVIX ) tablet 75 mg  75 mg Oral Daily Shona Terry N, DO   75 mg at 07/29/24 1043   dextrose  5 % solution    Intravenous Continuous Sigdel, Santosh, MD       enoxaparin  (LOVENOX ) injection 30 mg  30 mg Subcutaneous Q24H Hall, Carole N, DO   30 mg at 07/29/24 1047   gabapentin  (NEURONTIN ) capsule 300 mg  300 mg Oral QHS Shona Terry N, DO   300 mg at 07/29/24 0225   insulin  aspart (novoLOG ) injection 0-5 Units  0-5 Units Subcutaneous QHS Shona Terry N, DO       insulin  aspart (novoLOG ) injection 0-9 Units  0-9 Units Subcutaneous TID WC Shona Terry N, DO   5 Units at 07/29/24 1046   insulin  glargine (LANTUS ) injection 6 Units  6 Units Subcutaneous BID Sigdel, Derryl, MD       levothyroxine  (SYNTHROID ) tablet 50 mcg  50 mcg Oral Q0600 Shona Terry SAILOR, DO   50 mcg at 07/29/24 0512   melatonin tablet 6 mg  6 mg Oral QHS PRN Shona Terry SAILOR, DO       polyethylene glycol (MIRALAX  / GLYCOLAX ) packet 17 g  17 g Oral Daily PRN Shona Terry N, DO       prochlorperazine (COMPAZINE) injection 5 mg  5 mg Intravenous Q6H PRN Shona Terry N, DO       sertraline  (ZOLOFT ) tablet 50 mg  50 mg Oral Daily Hall, Carole N, DO   50 mg at 07/29/24 1043     Discharge Medications: Please see discharge summary for a list of discharge medications.  Relevant Imaging Results:  Relevant Lab Results:   Additional Information SSN: [id number]  Hoy DELENA Bigness, LCSW

## 2024-07-30 LAB — CBC WITH DIFFERENTIAL/PLATELET
Abs Immature Granulocytes: 0.05 K/uL (ref 0.00–0.07)
Basophils Absolute: 0.1 K/uL (ref 0.0–0.1)
Basophils Relative: 1 %
Eosinophils Absolute: 0.1 K/uL (ref 0.0–0.5)
Eosinophils Relative: 1 %
HCT: 39.9 % (ref 36.0–46.0)
Hemoglobin: 11.7 g/dL — ABNORMAL LOW (ref 12.0–15.0)
Immature Granulocytes: 1 %
Lymphocytes Relative: 29 %
Lymphs Abs: 2.6 K/uL (ref 0.7–4.0)
MCH: 28.1 pg (ref 26.0–34.0)
MCHC: 29.3 g/dL — ABNORMAL LOW (ref 30.0–36.0)
MCV: 95.9 fL (ref 80.0–100.0)
Monocytes Absolute: 0.6 K/uL (ref 0.1–1.0)
Monocytes Relative: 7 %
Neutro Abs: 5.6 K/uL (ref 1.7–7.7)
Neutrophils Relative %: 61 %
Platelets: 263 K/uL (ref 150–400)
RBC: 4.16 MIL/uL (ref 3.87–5.11)
RDW: 14.9 % (ref 11.5–15.5)
WBC: 9 K/uL (ref 4.0–10.5)
nRBC: 0 % (ref 0.0–0.2)

## 2024-07-30 LAB — GLUCOSE, CAPILLARY
Glucose-Capillary: 342 mg/dL — ABNORMAL HIGH (ref 70–99)
Glucose-Capillary: 395 mg/dL — ABNORMAL HIGH (ref 70–99)
Glucose-Capillary: 370 mg/dL — ABNORMAL HIGH (ref 70–99)
Glucose-Capillary: 353 mg/dL — ABNORMAL HIGH (ref 70–99)

## 2024-07-30 LAB — BASIC METABOLIC PANEL WITH GFR
Anion gap: 10 (ref 5–15)
BUN: 31 mg/dL — ABNORMAL HIGH (ref 8–23)
CO2: 23 mmol/L (ref 22–32)
Calcium: 8.9 mg/dL (ref 8.9–10.3)
Chloride: 116 mmol/L — ABNORMAL HIGH (ref 98–111)
Creatinine, Ser: 1.46 mg/dL — ABNORMAL HIGH (ref 0.44–1.00)
GFR, Estimated: 37 mL/min — ABNORMAL LOW (ref >60)
Glucose, Bld: 337 mg/dL — ABNORMAL HIGH (ref 70–99)
Potassium: 3.4 mmol/L — ABNORMAL LOW (ref 3.5–5.1)
Sodium: 149 mmol/L — ABNORMAL HIGH (ref 135–145)

## 2024-07-30 LAB — URINE CULTURE

## 2024-07-30 MED ORDER — LORAZEPAM 0.5 MG PO TABS
0.5000 mg | ORAL_TABLET | Freq: Two times a day (BID) | ORAL | Status: DC
  Administered 2024-07-30 – 2024-08-08 (×17): 0.5 mg via ORAL
  Filled 2024-07-30 (×17): qty 1

## 2024-07-30 MED ORDER — INSULIN GLARGINE 100 UNIT/ML ~~LOC~~ SOLN
10.0000 [IU] | Freq: Two times a day (BID) | SUBCUTANEOUS | Status: DC
  Administered 2024-07-30 – 2024-08-01 (×5): 10 [IU] via SUBCUTANEOUS
  Filled 2024-07-30 (×8): qty 0.1

## 2024-07-30 MED ORDER — FERROUS SULFATE 325 (65 FE) MG PO TABS
325.0000 mg | ORAL_TABLET | Freq: Two times a day (BID) | ORAL | Status: DC
  Administered 2024-07-30 – 2024-08-08 (×17): 325 mg via ORAL
  Filled 2024-07-30 (×17): qty 1

## 2024-07-30 NOTE — Evaluation (Signed)
 Clinical/Bedside Swallow Evaluation Patient Details  Name: Dawn Ho MRN: [id number] Date of Birth: May 22, 1951  Today's Date: 07/30/2024 Time: SLP Start Time (ACUTE ONLY): 1155 SLP Stop Time (ACUTE ONLY): 1220 SLP Time Calculation (min) (ACUTE ONLY): 25 min  Past Medical History:  Past Medical History:  Diagnosis Date   Cataract, left eye    Cerebral infarction (HCC)    Chronic atrial fibrillation (HCC)    Cognitive communication deficit    CVA (cerebral infarction)    Depression with anxiety    Diabetes mellitus    Dyslipidemia    Dysphagia    Encephalopathy    Fibromyalgia    Hypertension    Retinopathy    Stroke (HCC)    L. hemiparesis   TIA (transient ischemic attack)    Vascular dementia [facility])    Past Surgical History:  Past Surgical History:  Procedure Laterality Date   ABDOMINAL HYSTERECTOMY     age 42, removed one ovary- excessive bleeding   CESAREAN SECTION     CHOLECYSTECTOMY     LOOP RECORDER IMPLANT     Dr. Kelsie placed 10/15/2014   ORIF HIP FRACTURE Right 09/10/2020   Procedure: im nail;  Surgeon: Barbarann Oneil BROCKS, MD;  Location: [facility];  Service: Orthopedics;  Laterality: Right;   TEE WITHOUT CARDIOVERSION N/A 10/15/2014   Procedure: TRANSESOPHAGEAL ECHOCARDIOGRAM (TEE);  Surgeon: Vina Okey GAILS, MD;  Location: [facility];  Service: Cardiovascular;  Laterality: N/A;   HPI:  Dawn Ho is a 73 y.o. female with medical history significant for CVA with left hemiparesis, type 2 diabetes, hypertension, hyperlipidemia, chronic anxiety/depression, fibromyalgia, bilateral staghorn calculi, who presents to the ER from SNF due to increased fatigue for the past few days.  The patient is minimally verbal.  EMS was activated.  Upon EMS arrival the patient was hyperglycemic with CBG in the 300s.CXR negative for acute process.  ST evaluated 10/29 with recommendations for Dysphagia 2(minced)/thin liquids given min dentition/cognitive impairment.    Assessment / Plan /  Recommendation  Clinical Impression  Recommend Dysphagia 2(minced)/thin liquids with swallowing precautions in place to provide slow rate, small bites/sips, watch for swallow initiation prior to next bite/sip, and provide guided, single sips to eliminate/reduce aspiration risk with FULL supervision and 1:1 A with meals.  Pt seen for clinical swallow evaluation with pt exhibiting oropharyngeal dysphagia c/b oral holding/retention with all consistencies, delay in the initiation of the swallow and reported coughing (by CNA) with successive swallows with a straw during meal consumption.  Pt provided oral care with weak cough response noted and spontaneous swallow, generalized oral weakness observed during OME.  Pt consumed thin via single, guided sips and via tsp and puree without overt s/s of aspiration elicited.  Impaired mastication efforts d/t limited dentition and cognitive impairment with prolonged oral manipulation observed during soft solids.  Pt would benefit from medications being provided whole in puree.  ST will f/u briefly during acute stay for diet tolerance/education.  Thank you for this consult. SLP Visit Diagnosis: Dysphagia, oropharyngeal phase (R13.12)    Aspiration Risk  Mild aspiration risk;Moderate aspiration risk    Diet Recommendation   Thin;Dysphagia 2 (chopped)  Medication Administration: Whole meds with puree    Other Recommendations Oral Care Recommendations: Oral care BID;Staff/trained caregiver to provide oral care     Swallow Evaluation Recommendations  PO diet; Dysphagia 2/thin liquids with swallow precautions/aspiration precautions in place   Assistance Recommended at Discharge  FULL A/supervision  Functional Status Assessment Patient has had  a recent decline in their functional status and demonstrates the ability to make significant improvements in function in a reasonable and predictable amount of time.  Frequency and Duration min 1 x/week  1 week        Prognosis Prognosis for improved oropharyngeal function: Good Barriers to Reach Goals: Cognitive deficits      Swallow Study   General Date of Onset: 07/28/24 HPI: Dawn Ho is a 73 y.o. female with medical history significant for CVA with left hemiparesis, type 2 diabetes, hypertension, hyperlipidemia, chronic anxiety/depression, fibromyalgia, bilateral staghorn calculi, who presents to the ER from SNF due to increased fatigue for the past few days.  The patient is minimally verbal.  EMS was activated.  Upon EMS arrival the patient was hyperglycemic with CBG in the 300s.CXR negative for acute process.  ST evaluated 10/29 with recommendations for Dysphagia 2(minced)/thin liquids given min dentition/cognitive impairment. Type of Study: Bedside Swallow Evaluation Previous Swallow Assessment: 10/29 BSE completed with recs for Dysphagia 2/thin liquids Diet Prior to this Study: Regular;Thin liquids (Level 0) Temperature Spikes Noted: No Respiratory Status: Room air History of Recent Intubation: No Behavior/Cognition: Alert;Requires cueing Oral Cavity Assessment: Within Functional Limits Oral Care Completed by SLP: Yes Oral Cavity - Dentition: Missing dentition;Other (Comment) Self-Feeding Abilities: Total assist Patient Positioning: Upright in bed Baseline Vocal Quality: Low vocal intensity Volitional Cough: Weak Volitional Swallow: Able to elicit    Oral/Motor/Sensory Function Overall Oral Motor/Sensory Function: Mild impairment   Ice Chips Ice chips: Impaired Presentation: Spoon Oral Phase Impairments: Reduced lingual movement/coordination Oral Phase Functional Implications: Oral holding Pharyngeal Phase Impairments: Suspected delayed Swallow   Thin Liquid Thin Liquid: Impaired Presentation: Straw;Spoon Oral Phase Functional Implications: Oral holding Pharyngeal  Phase Impairments: Suspected delayed Swallow Other Comments: reported coughing with successive swallows per CNA     Nectar Thick Nectar Thick Liquid: Not tested   Honey Thick Honey Thick Liquid: Not tested   Puree Puree: Impaired Presentation: Spoon Oral Phase Impairments: Impaired mastication Oral Phase Functional Implications: Oral holding Pharyngeal Phase Impairments: Suspected delayed Swallow   Solid     Solid: Impaired Presentation: Spoon Oral Phase Impairments: Reduced lingual movement/coordination;Impaired mastication Oral Phase Functional Implications: Oral holding;Impaired mastication Pharyngeal Phase Impairments: Suspected delayed Swallow      Pat Jasman Pfeifle,M.S.,CCC-SLP 07/30/2024,12:33 PM

## 2024-07-30 NOTE — Inpatient Diabetes Management (Signed)
 Inpatient Diabetes Program Recommendations  AACE/ADA: New Consensus Statement on Inpatient Glycemic Control (2015)  Target Ranges:  Prepandial:   less than 140 mg/dL      Peak postprandial:   less than 180 mg/dL (1-2 hours)      Critically ill patients:  140 - 180 mg/dL   Lab Results  Component Value Date   GLUCAP 342 (H) 07/30/2024   HGBA1C 8.3 (H) 07/28/2024    Latest Reference Range & Units 07/29/24 07:59 07/29/24 11:29 07/29/24 16:52 07/29/24 20:09 07/30/24 07:31  Glucose-Capillary 70 - 99 mg/dL 707 (H) 673 (H) 718 (H) 292 (H) 342 (H)  (H): Data is abnormally high  Diabetes history: DM2 Outpatient Diabetes medications:  Lantus  10 bid, Humalog 0-12 tid  Current orders for Inpatient glycemic control: Lantus  6 bid, 0-9 tid, 0-5 units hs  Inpatient Diabetes Program Recommendations:   Please consider: -Increase Lantus  to 10 units bid -Add Novolog  3 units tid meal coverage if eats 50% -Add carb mod to current diet if appropriate  Thank you, Dagoberto E. Kazuo Durnil, RN, MSN, CNS, CDCES  Diabetes Coordinator Inpatient Glycemic Control Team Team Pager [phone] (8am-5pm) 07/30/2024 8:30 AM

## 2024-07-30 NOTE — Progress Notes (Signed)
 PROGRESS NOTE    Dawn Ho  FMW:[id number] DOB: 27-Jun-1951 DOA: 07/28/2024 PCP: Celine Erla Bong, MD   Brief Narrative:   Dawn Ho is a 73 y.o. female with medical history significant for CVA with left hemiparesis, type 2 diabetes, hypertension, hyperlipidemia, chronic anxiety/depression, fibromyalgia, bilateral staghorn calculi, who presents to the ER from SNF due to increased fatigue for the past few days.  The patient is minimally verbal.  EMS was activated.  Upon EMS arrival the patient was hyperglycemic with CBG in the 300s.   In the ER, alert and confused, hypertensive and tachypneic.  Sinus tachycardia with rate of 116 and QTc of 487 on 12-lead EKG.     Lab studies notable for serum sodium of 149, bicarb 19, glucose 306, anion gap 17, GFR 30, BUN 34 and creatinine 1.73.  UA positive for pyuria.   The patient received 1 dose of Rocephin , 1 L IV fluid bolus NS x 1 in the ER.  TRH, hospitalist service, was asked to admit for hypernatremia secondary to dehydration, AKI, and presumed UTI.   ED Course: Temperature 98.  BP 136/77, pulse 85, respiratory rate 22, O2 saturation 93% on room air.  Assessment & Plan:   Principal Problem:   AKI (acute kidney injury) Active Problems:   Diabetes mellitus (HCC)   History of TIAs   H/O: CVA (cerebrovascular accident)   Hypernatremia   Dehydration   Hypothyroidism   AKI, prerenal in the setting of dehydration from poor oral intake. Patient from SNF. Creatinine 1.73 on presentation, improving to 1.26, baseline is 0.7.  Will continue D5W due to hypernatremia  High anion gap metabolic acidosis in the setting of acute renal insufficiency Will continue IV fluid as above   hypernatremia, hypovolemic in the setting of poor oral intake. Presented with serum sodium of 149 and chloride 113 Sodium worsened to 153 but back to 149. Will continue D5W, increase Lantus  to 10 units twice daily.  Presumed UTI, POA UA positive for  pyuria Urine culture with insignificant growth, blood culture negative for 24 hours. Completed Rocephin  for 3 days then stop.  Type 2 diabetes with hyperglycemia Presented with serum glucose of 306 Last hemoglobin A1c 8.3 on 06/04/2023 Heart healthy carb modified diet Insulin  coverage, long-acting and short acting.  Increase Lantus  to 6 units twice daily   Generalized weakness PT OT evaluation Fall precautions.   History of prior CVA Resume home regimen Prior to admission on Plavix  and Lipitor.   Hypothyroidism Resume home levothyroxine .   Chronic anxiety/depression Resume home regimen.     Time: 75 minutes.     DVT prophylaxis: Subcu Lovenox  daily.   Code Status: Full code.   Family Communication: None at bedside.   Disposition Plan: Admitted to telemetry unit.   Consults called: None.      Status is: Observation The patient will require care spanning > 2 midnights and should be moved to inpatient because: Continued need for IV fluids due to worsening hypernatremia and AKI  Subjective:  Patient seen and examined at the bedside.  She is alert and oriented x 1.  More conversant today.  Agitated.  Objective: Vitals:   07/29/24 2250 07/30/24 0600 07/30/24 1009 07/30/24 1303  BP: (!) 145/77 (!) 153/83 (!) 176/87 (!) 169/85  Pulse:   84 84  Resp:  18 16 20   Temp:   98.4 F (36.9 C) 97.8 F (36.6 C)  TempSrc:   Oral Oral  SpO2:  98% 95% 94%  Weight:  Height:        Intake/Output Summary (Last 24 hours) at 07/30/2024 1516 Last data filed at 07/30/2024 1305 Gross per 24 hour  Intake 1390 ml  Output 1450 ml  Net -60 ml   Filed Weights   07/28/24 1927 07/29/24 0053  Weight: 74 kg 63.4 kg    Examination:  General exam: Alert, confused Respiratory system: Bilateral decreased breath sounds at bases Cardiovascular system: S1 & S2 heard, Rate controlled Gastrointestinal system: Abdomen is nondistended, soft and nontender. Normal bowel sounds  heard. Extremities: No edema Central nervous system: Alert and oriented x 1, cranial nerves grossly intact, more conversant today Data Reviewed: I have personally reviewed following labs and imaging studies  CBC: Recent Labs  Lab 07/28/24 1954 07/29/24 0509 07/30/24 0632  WBC 9.1 10.8* 9.0  NEUTROABS 6.6  --  5.6  HGB 12.6 11.8* 11.7*  HCT 40.4 38.8 39.9  MCV 91.6 93.5 95.9  PLT 343 304 263   Basic Metabolic Panel: Recent Labs  Lab 07/28/24 1954 07/29/24 0509 07/30/24 0632  NA 149* 153* 149*  K 4.0 3.7 3.4*  CL 113* 117* 116*  CO2 19* 23 23  GLUCOSE 306* 306* 337*  BUN 34* 33* 31*  CREATININE 1.73* 1.64* 1.46*  CALCIUM  9.5 9.0 8.9  MG  --  2.4  --   PHOS  --  3.7  --    GFR: Estimated Creatinine Clearance: 28.1 mL/min (A) (by C-G formula based on SCr of 1.46 mg/dL (H)). Liver Function Tests: Recent Labs  Lab 07/28/24 1954  AST 13*  ALT 10  ALKPHOS 103  BILITOT 0.3  PROT 7.8  ALBUMIN 3.5   No results for input(s): LIPASE, AMYLASE in the last 168 hours. No results for input(s): AMMONIA in the last 168 hours. Coagulation Profile: Recent Labs  Lab 07/28/24 1954  INR 1.1   Cardiac Enzymes: No results for input(s): CKTOTAL, CKMB, CKMBINDEX, TROPONINI in the last 168 hours. BNP (last 3 results) No results for input(s): PROBNP in the last 8760 hours. HbA1C: Recent Labs    07/28/24 1954  HGBA1C 8.3*   CBG: Recent Labs  Lab 07/29/24 1129 07/29/24 1652 07/29/24 2009 07/30/24 0731 07/30/24 1206  GLUCAP 326* 281* 292* 342* 395*   Lipid Profile: No results for input(s): CHOL, HDL, LDLCALC, TRIG, CHOLHDL, LDLDIRECT in the last 72 hours. Thyroid  Function Tests: No results for input(s): TSH, T4TOTAL, FREET4, T3FREE, THYROIDAB in the last 72 hours. Anemia Panel: No results for input(s): VITAMINB12, FOLATE, FERRITIN, TIBC, IRON, RETICCTPCT in the last 72 hours. Sepsis Labs: Recent Labs  Lab  07/28/24 1954  LATICACIDVEN 1.0    Recent Results (from the past 240 hours)  Culture, blood (Routine x 2)     Status: None (Preliminary result)   Collection Time: 07/28/24  7:54 PM   Specimen: BLOOD  Result Value Ref Range Status   Specimen Description BLOOD BLOOD RIGHT HAND  Final   Special Requests   Final    BOTTLES DRAWN AEROBIC AND ANAEROBIC Blood Culture adequate volume   Culture   Final    NO GROWTH 2 DAYS Performed at [facility], [address] [place], [place] [address]    Report Status PENDING  Incomplete  Urine Culture     Status: Abnormal   Collection Time: 07/28/24  8:10 PM   Specimen: Urine, Random  Result Value Ref Range Status   Specimen Description   Final    URINE, RANDOM Performed at [facility], [address]  [place], [place] [address]    Special Requests   Final    NONE Reflexed from [id number] Performed at [facility], [address] [place], [place] [address]    Culture MULTIPLE SPECIES PRESENT, SUGGEST RECOLLECTION (A)  Final   Report Status 07/30/2024 FINAL  Final  Culture, blood (Routine x 2)     Status: None (Preliminary result)   Collection Time: 07/28/24  8:22 PM   Specimen: BLOOD  Result Value Ref Range Status   Specimen Description BLOOD BLOOD LEFT HAND  Final   Special Requests   Final    AEROBIC BOTTLE ONLY Blood Culture results may not be optimal due to an inadequate volume of blood received in culture bottles   Culture   Final    NO GROWTH 2 DAYS Performed at [facility], [address] [place], [place] [address]    Report Status PENDING  Incomplete         Radiology Studies: DG Chest Port 1 View if patient is in a treatment room. Result Date: 07/28/2024 CLINICAL DATA:  Suspected sepsis. EXAM: PORTABLE CHEST 1 VIEW COMPARISON:  Chest radiograph dated 06/15/2023. FINDINGS: No focal consolidation, pleural effusion, pneumothorax. Stable cardiac silhouette. Loop recorder device. No acute osseous pathology. IMPRESSION: No active  disease. Electronically Signed   By: Vanetta Chou M.D.   On: 07/28/2024 20:01        Scheduled Meds:  atorvastatin   10 mg Oral QPM   clopidogrel   75 mg Oral Daily   enoxaparin  (LOVENOX ) injection  30 mg Subcutaneous Q24H   ferrous sulfate  325 mg Oral BID WC   gabapentin   300 mg Oral QHS   insulin  aspart  0-5 Units Subcutaneous QHS   insulin  aspart  0-9 Units Subcutaneous TID WC   insulin  glargine  10 Units Subcutaneous BID   levothyroxine   50 mcg Oral Q0600   LORazepam   0.5 mg Oral BID   sertraline   50 mg Oral Daily   Continuous Infusions:  cefTRIAXone  (ROCEPHIN )  IV Stopped (07/29/24 1620)          Derryl Duval, MD [facility] 07/30/2024, 3:16 PM

## 2024-07-30 NOTE — Plan of Care (Signed)

## 2024-07-31 ENCOUNTER — Inpatient Hospital Stay (HOSPITAL_COMMUNITY)

## 2024-07-31 DIAGNOSIS — R509 Fever, unspecified: Secondary | ICD-10-CM

## 2024-07-31 DIAGNOSIS — E860 Dehydration: Secondary | ICD-10-CM

## 2024-07-31 DIAGNOSIS — E870 Hyperosmolality and hypernatremia: Secondary | ICD-10-CM

## 2024-07-31 LAB — URINALYSIS, ROUTINE W REFLEX MICROSCOPIC
Bilirubin Urine: NEGATIVE
Glucose, UA: 150 mg/dL — AB
Ketones, ur: NEGATIVE mg/dL
Nitrite: POSITIVE — AB
Protein, ur: 100 mg/dL — AB
RBC / HPF: >50 RBC/hpf (ref 0–5)
Specific Gravity, Urine: 1.013 (ref 1.005–1.030)
WBC, UA: >50 WBC/hpf (ref 0–5)
pH: 6 (ref 5.0–8.0)

## 2024-07-31 LAB — BASIC METABOLIC PANEL WITH GFR
Anion gap: 10 (ref 5–15)
BUN: 20 mg/dL (ref 8–23)
CO2: 24 mmol/L (ref 22–32)
Calcium: 9.1 mg/dL (ref 8.9–10.3)
Chloride: 119 mmol/L — ABNORMAL HIGH (ref 98–111)
Creatinine, Ser: 1.39 mg/dL — ABNORMAL HIGH (ref 0.44–1.00)
GFR, Estimated: 40 mL/min — ABNORMAL LOW (ref >60)
Glucose, Bld: 253 mg/dL — ABNORMAL HIGH (ref 70–99)
Potassium: 3.1 mmol/L — ABNORMAL LOW (ref 3.5–5.1)
Sodium: 153 mmol/L — ABNORMAL HIGH (ref 135–145)

## 2024-07-31 LAB — GLUCOSE, CAPILLARY
Glucose-Capillary: 239 mg/dL — ABNORMAL HIGH (ref 70–99)
Glucose-Capillary: 297 mg/dL — ABNORMAL HIGH (ref 70–99)
Glucose-Capillary: 294 mg/dL — ABNORMAL HIGH (ref 70–99)
Glucose-Capillary: 276 mg/dL — ABNORMAL HIGH (ref 70–99)

## 2024-07-31 MED ORDER — BISACODYL 10 MG RE SUPP
10.0000 mg | Freq: Once | RECTAL | Status: AC
  Administered 2024-07-31: 10 mg via RECTAL
  Filled 2024-07-31: qty 1

## 2024-07-31 MED ORDER — SODIUM CHLORIDE 0.9 % IV SOLN
2.0000 g | Freq: Two times a day (BID) | INTRAVENOUS | Status: DC
  Administered 2024-07-31 – 2024-08-01 (×2): 2 g via INTRAVENOUS
  Filled 2024-07-31 (×2): qty 12.5

## 2024-07-31 MED ORDER — SENNOSIDES-DOCUSATE SODIUM 8.6-50 MG PO TABS
1.0000 | ORAL_TABLET | Freq: Two times a day (BID) | ORAL | Status: DC
  Administered 2024-07-31 – 2024-08-08 (×15): 1 via ORAL
  Filled 2024-07-31 (×15): qty 1

## 2024-07-31 MED ORDER — POTASSIUM CL IN DEXTROSE 5% 20 MEQ/L IV SOLN
20.0000 meq | INTRAVENOUS | Status: AC
  Administered 2024-07-31 (×2): 20 meq via INTRAVENOUS

## 2024-07-31 MED ORDER — FLEET ENEMA RE ENEM
1.0000 | ENEMA | Freq: Once | RECTAL | Status: AC
  Administered 2024-07-31: 1 via RECTAL

## 2024-07-31 NOTE — Progress Notes (Addendum)
 PROGRESS NOTE    Dawn Ho  FMW:[id number] DOB: 15-Jan-1951 DOA: 07/28/2024 PCP: Celine Erla Bong, MD   Brief Narrative:   Dawn Ho is a 73 y.o. female with medical history significant for CVA with left hemiparesis, type 2 diabetes, hypertension, hyperlipidemia, chronic anxiety/depression, fibromyalgia, bilateral staghorn calculi, who presents to the ER from SNF due to increased fatigue for the past few days.  The patient is minimally verbal.  EMS was activated.  Upon EMS arrival the patient was hyperglycemic with CBG in the 300s.   In the ER, alert and confused, hypertensive and tachypneic.  Sinus tachycardia with rate of 116 and QTc of 487 on 12-lead EKG.     Lab studies notable for serum sodium of 149, bicarb 19, glucose 306, anion gap 17, GFR 30, BUN 34 and creatinine 1.73.  UA positive for pyuria.   The patient received 1 dose of Rocephin , 1 L IV fluid bolus NS x 1 in the ER.  TRH, hospitalist service, was asked to admit for hypernatremia secondary to dehydration, AKI, and presumed UTI.   ED Course: Temperature 98.  BP 136/77, pulse 85, respiratory rate 22, O2 saturation 93% on room air.  Assessment & Plan:   Principal Problem:   AKI (acute kidney injury) Active Problems:   Diabetes mellitus (HCC)   History of TIAs   H/O: CVA (cerebrovascular accident)   Hypernatremia   Dehydration   Hypothyroidism   AKI, prerenal in the setting of dehydration from poor oral intake. Patient from SNF. Creatinine 1.73 on presentation, improving to 1.26, baseline is 0.7.  Will continue D5W due to hypernatremia  High anion gap metabolic acidosis in the setting of acute renal insufficiency Will continue IV fluid as above   hypernatremia, hypovolemic in the setting of poor oral intake. Presented with serum sodium of 149 and chloride 113 Sodium initially worsened to 153 and improved to 149, again worsened to 153 overnight.  Patient has not continuously received D5 infusion.  Will  ensure continuous D5 infusion.  Will add KCl due to low potassium.  Presumed UTI, POA UA positive for pyuria Urine culture with insignificant growth, blood culture negative for 48 hours. 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  Will follow-up on urine culture, continue cefepime.  CT also shows large stool load in rectum, will give enema, scheduled Senokot.    Type 2 diabetes with hyperglycemia Presented with serum glucose of 306 Last hemoglobin A1c 8.3 on 06/04/2023 Heart healthy carb modified diet Insulin  coverage, long-acting and short acting.  Lantus  currently 10 units twice daily, will decrease the dose once we take her off dextrose  infusion  Generalized weakness PT OT evaluation Fall precautions.   History of prior CVA Resume home regimen Prior to admission on Plavix  and Lipitor.   Hypothyroidism Resume home levothyroxine .   Chronic anxiety/depression Resume home regimen.        DVT prophylaxis: Subcu Lovenox  daily.   Code Status: Full code.   Family Communication: Updated patient's daughter-in-law over the phone Disposition Plan: Admitted to telemetry unit.   Consults called: None.      Patient will require inpatient management due to ongoing hyponatremia requiring continuous IV fluid, also fever requiring further workup and antibiotics.  subjective:  Patient seen and examined at the bedside.  She is alert and  oriented x 1.  Confused.  Febrile today with Tmax 101.1.  Objective: Vitals:   07/30/24 2331 07/31/24 0027 07/31/24 0446 07/31/24 1033  BP: (!) 167/85 (!) 158/79 (!) 150/77   Pulse: (!) 106 100 87   Resp: 19 18 18    Temp: (!) 100.9 F (38.3 C) 99.6 F (37.6 C) 98.3 F (36.8 C) (!) 101.1 F (38.4 C)  TempSrc: Oral Oral Oral  Axillary  SpO2: 94% 95% 100%   Weight:      Height:        Intake/Output Summary (Last 24 hours) at 07/31/2024 1400 Last data filed at 07/30/2024 1700 Gross per 24 hour  Intake 240 ml  Output --  Net 240 ml   Filed Weights   07/28/24 1927 07/29/24 0053  Weight: 74 kg 63.4 kg    Examination:  General exam: Alert, confused Respiratory system: Bilateral decreased breath sounds at bases Cardiovascular system: S1 & S2 heard, Rate controlled Gastrointestinal system: Abdomen is nondistended, soft and nontender. Normal bowel sounds heard. Extremities: No edema Central nervous system: Alert and oriented x 1, cranial nerves grossly intact, less conversant today data Reviewed: I have personally reviewed following labs and imaging studies  CBC: Recent Labs  Lab 07/28/24 1954 07/29/24 0509 07/30/24 0632  WBC 9.1 10.8* 9.0  NEUTROABS 6.6  --  5.6  HGB 12.6 11.8* 11.7*  HCT 40.4 38.8 39.9  MCV 91.6 93.5 95.9  PLT 343 304 263   Basic Metabolic Panel: Recent Labs  Lab 07/28/24 1954 07/29/24 0509 07/30/24 0632 07/31/24 0817  NA 149* 153* 149* 153*  K 4.0 3.7 3.4* 3.1*  CL 113* 117* 116* 119*  CO2 19* 23 23 24   GLUCOSE 306* 306* 337* 253*  BUN 34* 33* 31* 20  CREATININE 1.73* 1.64* 1.46* 1.39*  CALCIUM  9.5 9.0 8.9 9.1  MG  --  2.4  --   --   PHOS  --  3.7  --   --    GFR: Estimated Creatinine Clearance: 29.5 mL/min (A) (by C-G formula based on SCr of 1.39 mg/dL (H)). Liver Function Tests: Recent Labs  Lab 07/28/24 1954  AST 13*  ALT 10  ALKPHOS 103  BILITOT 0.3  PROT 7.8  ALBUMIN 3.5   No results for input(s): LIPASE, AMYLASE in the last 168 hours. No results for input(s): AMMONIA in the last 168 hours. Coagulation Profile: Recent Labs  Lab 07/28/24 1954  INR 1.1   Cardiac Enzymes: No results for input(s): CKTOTAL, CKMB, CKMBINDEX, TROPONINI in the last 168 hours. BNP (last 3 results) No results for input(s): PROBNP in the last 8760  hours. HbA1C: Recent Labs    07/28/24 1954  HGBA1C 8.3*   CBG: Recent Labs  Lab 07/30/24 1206 07/30/24 1618 07/30/24 2100 07/31/24 0731 07/31/24 1123  GLUCAP 395* 370* 353* 239* 297*   Lipid Profile: No results for input(s): CHOL, HDL, LDLCALC, TRIG, CHOLHDL, LDLDIRECT in the last 72 hours. Thyroid  Function Tests: No results for input(s): TSH, T4TOTAL, FREET4, T3FREE, THYROIDAB in the last 72 hours. Anemia Panel: No results for input(s): VITAMINB12, FOLATE, FERRITIN, TIBC, IRON, RETICCTPCT in the last 72 hours. Sepsis Labs: Recent Labs  Lab 07/28/24 1954  LATICACIDVEN 1.0    Recent Results (from the past 240 hours)  Culture, blood (Routine x 2)     Status: None (Preliminary result)   Collection Time: 07/28/24  7:54 PM   Specimen: BLOOD  Result Value Ref Range Status   Specimen Description BLOOD BLOOD RIGHT HAND  Final   Special Requests   Final    BOTTLES DRAWN AEROBIC AND ANAEROBIC Blood Culture adequate volume   Culture   Final    NO GROWTH 3 DAYS Performed at [facility], [address] [place], [place] [address]    Report Status PENDING  Incomplete  Urine Culture     Status: Abnormal   Collection Time: 07/28/24  8:10 PM   Specimen: Urine, Random  Result Value Ref Range Status   Specimen Description   Final    URINE, RANDOM Performed at [facility], [address], [place], [place] [address]    Special Requests   Final    NONE Reflexed from [id number] Performed at [facility], [address], [place], [place] [address]    Culture MULTIPLE SPECIES PRESENT, SUGGEST RECOLLECTION (A)  Final   Report Status 07/30/2024 FINAL  Final  Culture, blood (Routine x 2)     Status: None (Preliminary result)   Collection Time: 07/28/24  8:22 PM   Specimen: BLOOD  Result Value Ref Range Status   Specimen Description BLOOD BLOOD LEFT HAND  Final   Special Requests   Final    AEROBIC BOTTLE ONLY Blood Culture results may not be optimal due  to an inadequate volume of blood received in culture bottles   Culture   Final    NO GROWTH 3 DAYS Performed at [facility], [address] [place], [place] [address]    Report Status PENDING  Incomplete         Radiology Studies: No results found.       Scheduled Meds:  atorvastatin   10 mg Oral QPM   clopidogrel   75 mg Oral Daily   enoxaparin  (LOVENOX ) injection  30 mg Subcutaneous Q24H   ferrous sulfate  325 mg Oral BID WC   gabapentin   300 mg Oral QHS   insulin  aspart  0-5 Units Subcutaneous QHS   insulin  aspart  0-9 Units Subcutaneous TID WC   insulin  glargine  10 Units Subcutaneous BID   levothyroxine   50 mcg Oral Q0600   LORazepam   0.5 mg Oral BID   sertraline   50 mg Oral Daily   Continuous Infusions:  ceFEPime (MAXIPIME) IV     dextrose  5 % with KCl 20 mEq / L 20 mEq (07/31/24 1159)          Derryl Duval, MD [facility] 07/31/2024, 2:00 PM

## 2024-07-31 NOTE — Care Management Important Message (Signed)
 Important Message  Patient Details  Name: Dawn Ho MRN: [id number] Date of Birth: [birth year]   Important Message Given:  Yes - Medicare IM     Evoleth Nordmeyer L Edie Vallandingham 07/31/2024, 2:32 PM

## 2024-07-31 NOTE — Progress Notes (Signed)
 Speech Language Pathology Treatment: Dysphagia  Patient Details Name: Dawn Ho MRN: [id number] DOB: 1951/04/04 Today's Date: 07/31/2024 Time: 8855-8843 SLP Time Calculation (min) (ACUTE ONLY): 12 min  Assessment / Plan / Recommendation Clinical Impression  Ongoing diagnostic dysphagia therapy provided; Pt was taken down for CT shortly after SLP entered the room therefore very limited trials provided. Pt consumed thin liquids via straw and note even with very small sips an immediate cough with every sip. No coughing noted with limited trials of NTL. NT reports Pt only accepted puree textures at breakfast and expectorated all presented solid textures that were NOT puree. RN reports Pt currently has fever. Unfortunately d/t Pt being taken OTF for CT unable to provide additional trials of solids however, recommend downgrade diet do D1/puree and NECTAR thick liquids. ST will continue to provide dysphagia therapy to assess diet tolerance and possible upgrade, thank you,   HPI HPI: Dawn Ho is a 73 y.o. female with medical history significant for CVA with left hemiparesis, type 2 diabetes, hypertension, hyperlipidemia, chronic anxiety/depression, fibromyalgia, bilateral staghorn calculi, who presents to the ER from SNF due to increased fatigue for the past few days.  The patient is minimally verbal.  EMS was activated.  Upon EMS arrival the patient was hyperglycemic with CBG in the 300s.CXR negative for acute process.  ST evaluated 10/29 with recommendations for Dysphagia 2(minced)/thin liquids given min dentition/cognitive impairment.      SLP Plan  Continue with current plan of care        Swallow Evaluation Recommendations   Recommendations: PO diet PO Diet Recommendation: Dysphagia 1 (Pureed);Mildly thick liquids (Level 2, nectar thick) Liquid Administration via: Cup;Straw Medication Administration: Whole meds with puree Supervision: Full assist for feeding Postural changes: Position  pt fully upright for meals Oral care recommendations: Oral care BID (2x/day)     Recommendations   D1/puree and NTL                  Oral care BID;Staff/trained caregiver to provide oral care     Dysphagia, oropharyngeal phase (R13.12)     Continue with current plan of care    Shantanique Hodo H. [name] KILLIAN, CCC-SLP Speech Language Pathologist  Raguel VEAR [name]  07/31/2024, 12:06 PM

## 2024-07-31 NOTE — Plan of Care (Signed)
°  Problem: Education: Goal: Knowledge of General Education information will improve Description: Including pain rating scale, medication(s)/side effects and non-pharmacologic comfort measures Outcome: Not Progressing   Problem: Health Behavior/Discharge Planning: Goal: Ability to manage health-related needs will improve Outcome: Not Progressing   Problem: Clinical Measurements: Goal: Ability to maintain clinical measurements within normal limits will improve Outcome: Progressing Goal: Will remain free from infection Outcome: Progressing Goal: Diagnostic test results will improve Outcome: Progressing Goal: Respiratory complications will improve Outcome: Progressing Goal: Cardiovascular complication will be avoided Outcome: Progressing   Problem: Activity: Goal: Risk for activity intolerance will decrease Outcome: Not Progressing   Problem: Nutrition: Goal: Adequate nutrition will be maintained Outcome: Progressing   Problem: Coping: Goal: Level of anxiety will decrease Outcome: Progressing   Problem: Elimination: Goal: Will not experience complications related to bowel motility Outcome: Progressing Goal: Will not experience complications related to urinary retention Outcome: Progressing   Problem: Pain Managment: Goal: General experience of comfort will improve and/or be controlled Outcome: Progressing   Problem: Skin Integrity: Goal: Risk for impaired skin integrity will decrease Outcome: Progressing   Problem: Coping: Goal: Ability to adjust to condition or change in health will improve Outcome: Progressing   Problem: Fluid Volume: Goal: Ability to maintain a balanced intake and output will improve Outcome: Progressing   Problem: Metabolic: Goal: Ability to maintain appropriate glucose levels will improve Outcome: Progressing   Problem: Nutritional: Goal: Maintenance of adequate nutrition will improve Outcome: Progressing Goal: Progress toward achieving  an optimal weight will improve Outcome: Progressing   Problem: Skin Integrity: Goal: Risk for impaired skin integrity will decrease Outcome: Progressing   Problem: Tissue Perfusion: Goal: Adequacy of tissue perfusion will improve Outcome: Progressing

## 2024-07-31 NOTE — Inpatient Diabetes Management (Signed)
 Inpatient Diabetes Program Recommendations  AACE/ADA: New Consensus Statement on Inpatient Glycemic Control   Target Ranges:  Prepandial:   less than 140 mg/dL      Peak postprandial:   less than 180 mg/dL (1-2 hours)      Critically ill patients:  140 - 180 mg/dL   Lab Results  Component Value Date   GLUCAP 239 (H) 07/31/2024   HGBA1C 8.3 (H) 07/28/2024    Latest Reference Range & Units 07/30/24 07:31 07/30/24 12:06 07/30/24 16:18 07/30/24 21:00 07/31/24 07:31  Glucose-Capillary 70 - 99 mg/dL 657 (H) 604 (H) 629 (H) 353 (H) 239 (H)   Review of Glycemic Control  Diabetes history: DM2  Outpatient Diabetes medications:  Lantus  10 units BID Humalog 0-12 units TID   Current orders for Inpatient glycemic control:  Lantus  10 units BID  Humalog 0-9 units TID + 0-5 units at bedtime   Inpatient Diabetes Program Recommendations:   Please consider increasing Lantus  to 15 units BID.   Thanks,  Lavanda Search, RN, MSN, [facility]  Inpatient Diabetes Coordinator  Pager [phone] (8a-5p)

## 2024-08-01 LAB — BASIC METABOLIC PANEL WITH GFR
Anion gap: 9 (ref 5–15)
BUN: 22 mg/dL (ref 8–23)
CO2: 24 mmol/L (ref 22–32)
Calcium: 9.1 mg/dL (ref 8.9–10.3)
Chloride: 117 mmol/L — ABNORMAL HIGH (ref 98–111)
Creatinine, Ser: 1.35 mg/dL — ABNORMAL HIGH (ref 0.44–1.00)
GFR, Estimated: 41 mL/min — ABNORMAL LOW (ref >60)
Glucose, Bld: 224 mg/dL — ABNORMAL HIGH (ref 70–99)
Potassium: 3.3 mmol/L — ABNORMAL LOW (ref 3.5–5.1)
Sodium: 150 mmol/L — ABNORMAL HIGH (ref 135–145)

## 2024-08-01 LAB — GLUCOSE, CAPILLARY
Glucose-Capillary: 310 mg/dL — ABNORMAL HIGH (ref 70–99)
Glucose-Capillary: 379 mg/dL — ABNORMAL HIGH (ref 70–99)
Glucose-Capillary: 412 mg/dL — ABNORMAL HIGH (ref 70–99)
Glucose-Capillary: 459 mg/dL — ABNORMAL HIGH (ref 70–99)

## 2024-08-01 LAB — GLUCOSE, RANDOM: Glucose, Bld: 364 mg/dL — ABNORMAL HIGH (ref 70–99)

## 2024-08-01 MED ORDER — SODIUM CHLORIDE 0.9 % IV SOLN
1.0000 g | Freq: Two times a day (BID) | INTRAVENOUS | Status: DC
  Administered 2024-08-01 – 2024-08-06 (×12): 1 g via INTRAVENOUS
  Filled 2024-08-01 (×12): qty 20

## 2024-08-01 MED ORDER — INSULIN GLARGINE 100 UNIT/ML ~~LOC~~ SOLN
15.0000 [IU] | Freq: Two times a day (BID) | SUBCUTANEOUS | Status: DC
  Administered 2024-08-02 – 2024-08-03 (×5): 15 [IU] via SUBCUTANEOUS
  Filled 2024-08-01 (×8): qty 0.15

## 2024-08-01 MED ORDER — INSULIN ASPART 100 UNIT/ML IJ SOLN
3.0000 [IU] | Freq: Three times a day (TID) | INTRAMUSCULAR | Status: DC
  Administered 2024-08-01 – 2024-08-08 (×16): 3 [IU] via SUBCUTANEOUS
  Filled 2024-08-01 (×16): qty 1

## 2024-08-01 MED ORDER — SODIUM CHLORIDE 0.9 % IV SOLN: 1.0000 g | INTRAVENOUS | Status: DC

## 2024-08-01 MED ORDER — INSULIN ASPART 100 UNIT/ML IJ SOLN
15.0000 [IU] | Freq: Once | INTRAMUSCULAR | Status: AC
  Administered 2024-08-01: 15 [IU] via SUBCUTANEOUS
  Filled 2024-08-01: qty 1

## 2024-08-01 MED ORDER — KCL IN DEXTROSE-NACL 20-5-0.45 MEQ/L-%-% IV SOLN: INTRAVENOUS | Status: AC

## 2024-08-01 MED ORDER — ENOXAPARIN SODIUM 40 MG/0.4ML IJ SOSY
40.0000 mg | PREFILLED_SYRINGE | INTRAMUSCULAR | Status: DC
  Administered 2024-08-02 – 2024-08-08 (×6): 40 mg via SUBCUTANEOUS
  Filled 2024-08-01 (×6): qty 0.4

## 2024-08-01 NOTE — Progress Notes (Signed)
 PROGRESS NOTE    Dawn Ho  FMW:[id number] DOB: November 28, 1950 DOA: 07/28/2024 PCP: Celine Erla Bong, MD   Brief Narrative:   Dawn Ho is a 73 y.o. female with medical history significant for CVA with left hemiparesis, type 2 diabetes, hypertension, hyperlipidemia, chronic anxiety/depression, fibromyalgia, bilateral staghorn calculi, who presents to the ER from SNF due to increased fatigue for the past few days.  The patient is minimally verbal.  EMS was activated.  Upon EMS arrival the patient was hyperglycemic with CBG in the 300s.   In the ER, alert and confused, hypertensive and tachypneic.  Sinus tachycardia with rate of 116 and QTc of 487 on 12-lead EKG.     Lab studies notable for serum sodium of 149, bicarb 19, glucose 306, anion gap 17, GFR 30, BUN 34 and creatinine 1.73.  UA positive for pyuria.   The patient received 1 dose of Rocephin , 1 L IV fluid bolus NS x 1 in the ER.  TRH, hospitalist service, was asked to admit for hypernatremia secondary to dehydration, AKI, and presumed UTI.  Patient developed fever despite antibiotic therapy.  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  Antibiotics switched to meropenem  based on previous cultures.     Assessment & Plan:   Principal Problem:   AKI (acute kidney injury) Active Problems:   Diabetes mellitus (HCC)   History of TIAs   H/O: CVA (cerebrovascular accident)   Hypernatremia   Dehydration   Hypothyroidism   AKI, prerenal in the setting of dehydration from poor oral intake. Patient from SNF. Creatinine 1.73 on presentation, improving to 1.26, baseline is 0.7.  IV fluid is below CT shows bilateral staghorn calculi with no hydronephrosis.   High anion gap metabolic acidosis in the setting of acute renal insufficiency Will continue IV fluid as above   hypernatremia, hypovolemic in the setting of poor oral intake. Presented with serum sodium of 149 and chloride 113 Sodium  initially worsened to 153->149->153->150 overnight.  Patient unfortunately did not receive D5W consistently. - Will continue with D5W half patient normal saline plus Kcl Repeat labs in the a.m.  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  Patient received IV ceftriaxone  for 3 doses +1-day of cefepime .  Will switch to meropenem  based on previous culture.  New urine culture obtained from 12/12, will follow - Patient has history of known kidney stones on both sides and has seen urology in the past but looks like lost follow-up Patient denies abdominal pain and is nontender to exam -Will monitor fever curve.  She is afebrile for 24 hours now   Type 2 diabetes with hyperglycemia Presented with serum glucose of 306 Last hemoglobin A1c 8.3 on 06/04/2023 Heart healthy carb modified diet Insulin  coverage, long-acting and short acting.  Lantus  currently 10 units twice daily, will decrease the dose once we take her off dextrose  infusion  Generalized weakness PT OT evaluation Fall precautions.   History of prior CVA Resume home regimen Prior to admission on Plavix  and Lipitor.   Hypothyroidism Resume home levothyroxine .   Chronic anxiety/depression Resume home regimen.     DVT prophylaxis: Subcu Lovenox  daily.   Code Status: Full code.   Family Communication: Updated patient's daughter-in-law over the phone 12/12 Disposition Plan: Admitted to telemetry unit.   Consults called: None.      Patient will require inpatient management due to ongoing hyponatremia requiring continuous IV fluid,  also fever requiring further workup and antibiotics.  subjective:  Patient seen and examined at the bedside.  She has been afebrile for more than 24 hours now.  Alert and oriented.  Denies any abdominal pain, nontender on exam.  Sodium is still high at 150.  Objective: Vitals:    07/31/24 1731 07/31/24 2124 08/01/24 0511 08/01/24 1352  BP: (!) 154/87 (!) 157/87 (!) 170/80 (!) 159/82  Pulse: 79 77 76 84  Resp: 20 20 18 16   Temp: 97.7 F (36.5 C) 98.3 F (36.8 C) (!) 97.5 F (36.4 C) 98.6 F (37 C)  TempSrc: Axillary Oral Oral Oral  SpO2: 96% 97% 97% 97%  Weight:   63.4 kg   Height:        Intake/Output Summary (Last 24 hours) at 08/01/2024 1626 Last data filed at 08/01/2024 1329 Gross per 24 hour  Intake 2203.29 ml  Output 900 ml  Net 1303.29 ml   Filed Weights   07/28/24 1927 07/29/24 0053 08/01/24 0511  Weight: 74 kg 63.4 kg 63.4 kg    Examination:  General exam: Alert, confused Respiratory system: Bilateral decreased breath sounds at bases Cardiovascular system: S1 & S2 heard, Rate controlled Gastrointestinal system: Abdomen is nondistended, soft and nontender. Normal bowel sounds heard. Extremities: No edema Central nervous system: Alert and oriented x 1, cranial nerves grossly intact, less conversant today data Reviewed: I have personally reviewed following labs and imaging studies  CBC: Recent Labs  Lab 07/28/24 1954 07/29/24 0509 07/30/24 0632  WBC 9.1 10.8* 9.0  NEUTROABS 6.6  --  5.6  HGB 12.6 11.8* 11.7*  HCT 40.4 38.8 39.9  MCV 91.6 93.5 95.9  PLT 343 304 263   Basic Metabolic Panel: Recent Labs  Lab 07/28/24 1954 07/29/24 0509 07/30/24 0632 07/31/24 0817 08/01/24 0508  NA 149* 153* 149* 153* 150*  K 4.0 3.7 3.4* 3.1* 3.3*  CL 113* 117* 116* 119* 117*  CO2 19* 23 23 24 24   GLUCOSE 306* 306* 337* 253* 224*  BUN 34* 33* 31* 20 22  CREATININE 1.73* 1.64* 1.46* 1.39* 1.35*  CALCIUM  9.5 9.0 8.9 9.1 9.1  MG  --  2.4  --   --   --   PHOS  --  3.7  --   --   --    GFR: Estimated Creatinine Clearance: 30.4 mL/min (A) (by C-G formula based on SCr of 1.35 mg/dL (H)). Liver Function Tests: Recent Labs  Lab 07/28/24 1954  AST 13*  ALT 10  ALKPHOS 103  BILITOT 0.3  PROT 7.8  ALBUMIN 3.5   No results for input(s):  LIPASE, AMYLASE in the last 168 hours. No results for input(s): AMMONIA in the last 168 hours. Coagulation Profile: Recent Labs  Lab 07/28/24 1954  INR 1.1   Cardiac Enzymes: No results for input(s): CKTOTAL, CKMB, CKMBINDEX, TROPONINI in the last 168 hours. BNP (last 3 results) No results for input(s): PROBNP in the last 8760 hours. HbA1C: No results for input(s): HGBA1C in the last 72 hours.  CBG: Recent Labs  Lab 07/31/24 1123 07/31/24 1643 07/31/24 2110 08/01/24 0749 08/01/24 1127  GLUCAP 297* 294* 276* 310* 379*   Lipid Profile: No results for input(s): CHOL, HDL, LDLCALC, TRIG, CHOLHDL, LDLDIRECT in the last 72 hours. Thyroid  Function Tests: No results for input(s): TSH, T4TOTAL, FREET4, T3FREE, THYROIDAB in the last 72 hours. Anemia Panel: No results for input(s): VITAMINB12, FOLATE, FERRITIN, TIBC, IRON, RETICCTPCT in the last 72 hours. Sepsis Labs: Recent Labs  Lab  07/28/24 1954  LATICACIDVEN 1.0    Recent Results (from the past 240 hours)  Culture, blood (Routine x 2)     Status: None (Preliminary result)   Collection Time: 07/28/24  7:54 PM   Specimen: BLOOD  Result Value Ref Range Status   Specimen Description BLOOD BLOOD RIGHT HAND  Final   Special Requests   Final    BOTTLES DRAWN AEROBIC AND ANAEROBIC Blood Culture adequate volume   Culture   Final    NO GROWTH 4 DAYS Performed at [facility], [address] [place], [place] [address]    Report Status PENDING  Incomplete  Urine Culture     Status: Abnormal   Collection Time: 07/28/24  8:10 PM   Specimen: Urine, Random  Result Value Ref Range Status   Specimen Description   Final    URINE, RANDOM Performed at [facility], [address] [place], [place] [address]    Special Requests   Final    NONE Reflexed from [id number] Performed at [facility], [address] [place], [place] [address]    Culture MULTIPLE SPECIES PRESENT, SUGGEST  RECOLLECTION (A)  Final   Report Status 07/30/2024 FINAL  Final  Culture, blood (Routine x 2)     Status: None (Preliminary result)   Collection Time: 07/28/24  8:22 PM   Specimen: BLOOD  Result Value Ref Range Status   Specimen Description BLOOD BLOOD LEFT HAND  Final   Special Requests   Final    AEROBIC BOTTLE ONLY Blood Culture results may not be optimal due to an inadequate volume of blood received in culture bottles   Culture   Final    NO GROWTH 4 DAYS Performed at [facility], [address], [place], [place] [address]    Report Status PENDING  Incomplete         Radiology Studies: CT CHEST ABDOMEN PELVIS WO CONTRAST Result Date: 07/31/2024 CLINICAL DATA:  Sepsis and fever. EXAM: CT CHEST, ABDOMEN AND PELVIS WITHOUT CONTRAST TECHNIQUE: Multidetector CT imaging of the chest, abdomen and pelvis was performed following the standard protocol without IV contrast. RADIATION DOSE REDUCTION: This exam was performed according to the departmental dose-optimization program which includes automated exposure control, adjustment of the mA and/or kV according to patient size and/or use of iterative reconstruction technique. COMPARISON:  05/10/2022 FINDINGS: CT CHEST FINDINGS Cardiovascular: The heart size is upper normal to borderline enlarged. No substantial pericardial effusion. Coronary artery calcification is evident. Mild atherosclerotic calcification is noted in the wall of the thoracic aorta. Mediastinum/Nodes: No mediastinal lymphadenopathy. No evidence for gross hilar lymphadenopathy although assessment is limited by the lack of intravenous contrast on the current study. The esophagus has normal imaging features. There is no axillary lymphadenopathy. Lungs/Pleura: No suspicious pulmonary nodule or mass. Asymmetric ground-glass opacity in the right lower lobe is probably atelectasis although component of pneumonia is not excluded. No suspicious pulmonary nodule or mass. No pulmonary edema or  pleural effusion. Musculoskeletal: No worrisome lytic or sclerotic osseous abnormality. CT ABDOMEN PELVIS FINDINGS Hepatobiliary: No suspicious focal abnormality in the liver on this study without intravenous contrast. A tiny hypodensity in the liver parenchyma is too small to characterize but is statistically most likely benign. No followup imaging is recommended. Gallbladder is surgically absent. No intrahepatic or extrahepatic biliary dilation. Pancreas: No focal mass lesion. No dilatation of the main duct. No intraparenchymal cyst. No peripancreatic edema. Spleen: No splenomegaly. No suspicious focal mass lesion. Adrenals/Urinary Tract: Nodular thickening of both adrenal glands  is similar to prior. Large staghorn calculi are seen in both kidneys, largely filling the renal calices and renal pelvis bilaterally. There is associated gas in the left intrarenal collecting system. No hydroureter. The urinary bladder appears normal for the degree of distention. Stomach/Bowel: Stomach is decompressed. Duodenum is normally positioned as is the ligament of Treitz. No small bowel wall thickening. No small bowel dilatation. The terminal ileum is normal. The appendix is not well visualized, but there is no edema or inflammation in the region of the cecal tip to suggest appendicitis. No gross colonic mass. No colonic wall thickening. Large stool volume noted in the rectum. Vascular/Lymphatic: There is moderate atherosclerotic calcification of the abdominal aorta without aneurysm. There is no gastrohepatic or hepatoduodenal ligament lymphadenopathy. No retroperitoneal or mesenteric lymphadenopathy. No pelvic sidewall lymphadenopathy. Reproductive: Hysterectomy.  There is no adnexal mass. Other: No intraperitoneal free fluid. Musculoskeletal: Scattered foci of gas in the subcutaneous fat of the lower anterior abdominal wall likely secondary to injection. Bones are diffusely demineralized. Sclerotic lesion left iliac bone is  stable since prior, likely benign. Fixation hardware in the proximal right femur has been incompletely visualized. IMPRESSION: 1. Large staghorn calculi in both kidneys, largely filling the renal calices and renal pelves bilaterally. There is associated gas in the left intrarenal collecting system. Imaging features are most suggestive of left-sided emphysematous pyelitis. No definite parenchymal gas at this time to suggest emphysematous pyonephrosis. 2. Asymmetric ground-glass opacity in the right lower lobe is probably atelectasis although component of pneumonia is not excluded. 3. Large stool volume in the rectum. Correlation for signs/symptoms of fecal impaction recommended. 4. Aortic Atherosclerosis (ICD10-I70.0) and Emphysema (ICD10-J43.9). These results will be called to the ordering clinician or representative by the Radiologist Assistant, and communication documented in the PACS or [employer]. Electronically Signed   By: Camellia Candle M.D.   On: 07/31/2024 15:23         Scheduled Meds:  atorvastatin   10 mg Oral QPM   clopidogrel   75 mg Oral Daily   [START ON 08/02/2024] enoxaparin  (LOVENOX ) injection  40 mg Subcutaneous Q24H   ferrous sulfate   325 mg Oral BID WC   gabapentin   300 mg Oral QHS   insulin  aspart  0-5 Units Subcutaneous QHS   insulin  aspart  0-9 Units Subcutaneous TID WC   insulin  glargine  10 Units Subcutaneous BID   levothyroxine   50 mcg Oral Q0600   LORazepam   0.5 mg Oral BID   senna-docusate  1 tablet Oral BID   sertraline   50 mg Oral Daily   Continuous Infusions:  dextrose  5 % and 0.45 % NaCl with KCl 20 mEq/L 100 mL/hr at 08/01/24 1140   meropenem  (MERREM ) IV 1 g (08/01/24 1042)          Derryl Duval, MD [facility] 08/01/2024, 4:26 PM

## 2024-08-01 NOTE — Plan of Care (Signed)

## 2024-08-01 NOTE — Plan of Care (Signed)

## 2024-08-02 DIAGNOSIS — R509 Fever, unspecified: Secondary | ICD-10-CM | POA: Diagnosis not present

## 2024-08-02 DIAGNOSIS — N179 Acute kidney failure, unspecified: Secondary | ICD-10-CM | POA: Diagnosis not present

## 2024-08-02 DIAGNOSIS — E860 Dehydration: Secondary | ICD-10-CM | POA: Diagnosis not present

## 2024-08-02 DIAGNOSIS — E870 Hyperosmolality and hypernatremia: Secondary | ICD-10-CM | POA: Diagnosis not present

## 2024-08-02 LAB — URINALYSIS, W/ REFLEX TO CULTURE (INFECTION SUSPECTED)
Bilirubin Urine: NEGATIVE
Glucose, UA: 150 mg/dL — AB
Ketones, ur: NEGATIVE mg/dL
Nitrite: POSITIVE — AB
Protein, ur: 100 mg/dL — AB
Specific Gravity, Urine: 1.014 (ref 1.005–1.030)
WBC, UA: >50 WBC/hpf (ref 0–5)
pH: 6 (ref 5.0–8.0)

## 2024-08-02 LAB — CBC
HCT: 35 % — ABNORMAL LOW (ref 36.0–46.0)
Hemoglobin: 10.9 g/dL — ABNORMAL LOW (ref 12.0–15.0)
MCH: 28.7 pg (ref 26.0–34.0)
MCHC: 31.1 g/dL (ref 30.0–36.0)
MCV: 92.1 fL (ref 80.0–100.0)
Platelets: 207 K/uL (ref 150–400)
RBC: 3.8 MIL/uL — ABNORMAL LOW (ref 3.87–5.11)
RDW: 15 % (ref 11.5–15.5)
WBC: 12.4 K/uL — ABNORMAL HIGH (ref 4.0–10.5)
nRBC: 0 % (ref 0.0–0.2)

## 2024-08-02 LAB — BASIC METABOLIC PANEL WITH GFR
Anion gap: 7 (ref 5–15)
BUN: 17 mg/dL (ref 8–23)
CO2: 24 mmol/L (ref 22–32)
Calcium: 8.6 mg/dL — ABNORMAL LOW (ref 8.9–10.3)
Chloride: 117 mmol/L — ABNORMAL HIGH (ref 98–111)
Creatinine, Ser: 1.18 mg/dL — ABNORMAL HIGH (ref 0.44–1.00)
GFR, Estimated: 48 mL/min — ABNORMAL LOW (ref >60)
Glucose, Bld: 194 mg/dL — ABNORMAL HIGH (ref 70–99)
Potassium: 3.5 mmol/L (ref 3.5–5.1)
Sodium: 148 mmol/L — ABNORMAL HIGH (ref 135–145)

## 2024-08-02 LAB — GLUCOSE, CAPILLARY
Glucose-Capillary: 190 mg/dL — ABNORMAL HIGH (ref 70–99)
Glucose-Capillary: 199 mg/dL — ABNORMAL HIGH (ref 70–99)
Glucose-Capillary: 162 mg/dL — ABNORMAL HIGH (ref 70–99)
Glucose-Capillary: 214 mg/dL — ABNORMAL HIGH (ref 70–99)

## 2024-08-02 LAB — CULTURE, BLOOD (ROUTINE X 2)
Culture: NO GROWTH
Culture: NO GROWTH
Special Requests: ADEQUATE

## 2024-08-02 MED ORDER — KCL IN DEXTROSE-NACL 20-5-0.45 MEQ/L-%-% IV SOLN: INTRAVENOUS | Status: AC

## 2024-08-02 NOTE — Plan of Care (Signed)

## 2024-08-02 NOTE — Progress Notes (Signed)
 Speech Language Pathology Treatment: Dysphagia  Patient Details Name: Dawn Ho MRN: [id number] DOB: 1950-09-19 Today's Date: 08/02/2024 Time: 1500-1520 SLP Time Calculation (min) (ACUTE ONLY): 20 min  Assessment / Plan / Recommendation Clinical Impression  Ongoing diagnostic dysphagia therapy provided while Pt's sister was present at bedside. Pt was provided ice chips, cups sips of thin liquids and small straw sips of thin liquids; note no coughing or wet vocal quality with trials. Pt accepted limited trials of mechanical soft (peaches); Pt with some prolonged mastication and mild oral residue after the swallow. Liquid wash was effective for clearing oral residue. Recommend upgrade Pt's diet to D2/fine chopped and THIN liquids. Recommend Pt take SMALL SIPS and only be provided PO when alert and responsive. ST will continue to follow,    HPI HPI: Dawn Ho is a 73 y.o. female with medical history significant for CVA with left hemiparesis, type 2 diabetes, hypertension, hyperlipidemia, chronic anxiety/depression, fibromyalgia, bilateral staghorn calculi, who presents to the ER from SNF due to increased fatigue for the past few days.  The patient is minimally verbal.  EMS was activated.  Upon EMS arrival the patient was hyperglycemic with CBG in the 300s.CXR negative for acute process.  ST evaluated 10/29 with recommendations for Dysphagia 2(minced)/thin liquids given min dentition/cognitive impairment.      SLP Plan  Continue with current plan of care        Swallow Evaluation Recommendations   Recommendations: PO diet PO Diet Recommendation: Dysphagia 2 (Finely chopped);Thin liquids (Level 0) Liquid Administration via: Cup;Straw Medication Administration: Crushed with puree Supervision: Full assist for feeding Postural changes: Stay upright 30-60 min after meals;Out of bed for meals Oral care recommendations: Oral care BID (2x/day) Caregiver Recommendations: Other (Comment)      Recommendations                     Oral care BID;Staff/trained caregiver to provide oral care           Continue with current plan of care    Dawn Ho, CCC-SLP Speech Language Pathologist  Dawn Ho Dawn Ho  08/02/2024, 3:21 PM

## 2024-08-02 NOTE — Progress Notes (Signed)
 PROGRESS NOTE    Dawn Ho  FMW:[id number] DOB: Jan 02, 1951 DOA: 07/28/2024 PCP: Celine Erla Bong, MD   Brief Narrative:   Dawn Ho is a 73 y.o. female with medical history significant for CVA with left hemiparesis, type 2 diabetes, hypertension, hyperlipidemia, chronic anxiety/depression, fibromyalgia, bilateral staghorn calculi, who presents to the ER from SNF due to increased fatigue for the past few days.  The patient is minimally verbal.  EMS was activated.  Upon EMS arrival the patient was hyperglycemic with CBG in the 300s.   In the ER, alert and confused, hypertensive and tachypneic.  Sinus tachycardia with rate of 116 and QTc of 487 on 12-lead EKG.     Lab studies notable for serum sodium of 149, bicarb 19, glucose 306, anion gap 17, GFR 30, BUN 34 and creatinine 1.73.  UA positive for pyuria.   The patient received 1 dose of Rocephin , 1 L IV fluid bolus NS x 1 in the ER.  TRH, hospitalist service, was asked to admit for hypernatremia secondary to dehydration, AKI, and presumed UTI.  Patient developed fever despite antibiotic therapy.  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  Antibiotics switched to meropenem  based on previous cultures.     Assessment & Plan:   Principal Problem:   AKI (acute kidney injury) Active Problems:   Diabetes mellitus (HCC)   History of TIAs   H/O: CVA (cerebrovascular accident)   Hypernatremia   Dehydration   Hypothyroidism   AKI, prerenal in the setting of dehydration from poor oral intake. Patient from SNF. Creatinine 1.73 on presentation, improving to 1.1, baseline is 0.7.  IV fluid is below CT shows bilateral staghorn calculi with no hydronephrosis. Monitor intake and output   High anion gap metabolic acidosis in the setting of acute renal insufficiency Will continue IV fluid as above   hypernatremia, hypovolemic in the setting of poor oral intake. Presented with serum sodium of 149  and chloride 113 Sodium initially worsened to 153->149->153->150->148 overnight.   - Will continue with D5W half patient normal saline plus Kcl Repeat labs in the a.m.  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  Patient received IV ceftriaxone  for 3 doses +1-day of cefepime .  Will switch to meropenem  based on previous culture.  New UA obtained from 12/12 is positive, will follow cultures - Patient has history of known kidney stones on both sides and has seen urology in the past but looks like lost follow-up Patient denies abdominal pain and is nontender to exam -Will monitor fever curve.  She is afebrile for >24 hours now   Type 2 diabetes with hyperglycemia Presented with serum glucose of 306 Last hemoglobin A1c 8.3 on 06/04/2023 Heart healthy carb modified diet Insulin  coverage, long-acting and short acting.  Lantus  currently 10 units twice daily, will decrease the dose once we take her off dextrose  infusion  Generalized weakness PT OT evaluation Fall precautions.   History of prior CVA Resume home regimen Prior to admission on Plavix  and Lipitor.   Hypothyroidism Resume home levothyroxine .   Chronic anxiety/depression Resume home regimen.     DVT prophylaxis: Subcu Lovenox  daily.   Code Status: Full code.   Family Communication: Updated patient's daughter-in-law over the phone 12/12 Disposition Plan: Admitted to telemetry unit.   Consults called: None.      Patient will require inpatient management due to ongoing hyponatremia requiring continuous IV fluid,  also fever requiring further workup and antibiotics.  subjective:  Patient seen and examined at the bedside.  She has been afebrile for more than 48 hours now.  Alert and oriented.  Denies any abdominal pain, nontender on exam.  Sodium is still high at 148.  Objective: Vitals:   08/01/24 0511  08/01/24 1352 08/01/24 1939 08/02/24 0452  BP: (!) 170/80 (!) 159/82 (!) 141/70 139/69  Pulse: 76 84 77 78  Resp: 18 16 18 17   Temp: (!) 97.5 F (36.4 C) 98.6 F (37 C) 98.1 F (36.7 C) 98.9 F (37.2 C)  TempSrc: Oral Oral Oral Oral  SpO2: 97% 97% 99% 99%  Weight: 63.4 kg     Height:        Intake/Output Summary (Last 24 hours) at 08/02/2024 1326 Last data filed at 08/02/2024 0825 Gross per 24 hour  Intake 3061.51 ml  Output 1000 ml  Net 2061.51 ml   Filed Weights   07/28/24 1927 07/29/24 0053 08/01/24 0511  Weight: 74 kg 63.4 kg 63.4 kg    Examination:  General exam: Alert, confused Respiratory system: Bilateral decreased breath sounds at bases Cardiovascular system: S1 & S2 heard, Rate controlled Gastrointestinal system: Abdomen is nondistended, soft and nontender. Normal bowel sounds heard. Extremities: No edema Central nervous system: Alert and oriented x 1, cranial nerves grossly intact, less conversant today data Reviewed: I have personally reviewed following labs and imaging studies  CBC: Recent Labs  Lab 07/28/24 1954 07/29/24 0509 07/30/24 0632 08/02/24 0632  WBC 9.1 10.8* 9.0 12.4*  NEUTROABS 6.6  --  5.6  --   HGB 12.6 11.8* 11.7* 10.9*  HCT 40.4 38.8 39.9 35.0*  MCV 91.6 93.5 95.9 92.1  PLT 343 304 263 207   Basic Metabolic Panel: Recent Labs  Lab 07/29/24 0509 07/30/24 0632 07/31/24 0817 08/01/24 0508 08/01/24 2234 08/02/24 0632  NA 153* 149* 153* 150*  --  148*  K 3.7 3.4* 3.1* 3.3*  --  3.5  CL 117* 116* 119* 117*  --  117*  CO2 23 23 24 24   --  24  GLUCOSE 306* 337* 253* 224* 364* 194*  BUN 33* 31* 20 22  --  17  CREATININE 1.64* 1.46* 1.39* 1.35*  --  1.18*  CALCIUM  9.0 8.9 9.1 9.1  --  8.6*  MG 2.4  --   --   --   --   --   PHOS 3.7  --   --   --   --   --    GFR: Estimated Creatinine Clearance: 34.8 mL/min (A) (by C-G formula based on SCr of 1.18 mg/dL (H)). Liver Function Tests: Recent Labs  Lab 07/28/24 1954  AST 13*   ALT 10  ALKPHOS 103  BILITOT 0.3  PROT 7.8  ALBUMIN 3.5   No results for input(s): LIPASE, AMYLASE in the last 168 hours. No results for input(s): AMMONIA in the last 168 hours. Coagulation Profile: Recent Labs  Lab 07/28/24 1954  INR 1.1   Cardiac Enzymes: No results for input(s): CKTOTAL, CKMB, CKMBINDEX, TROPONINI in the last 168 hours. BNP (last 3 results) No results for input(s): PROBNP in the last 8760 hours. HbA1C: No results for input(s): HGBA1C in the last 72 hours.  CBG: Recent Labs  Lab 08/01/24 1127 08/01/24 1632 08/01/24 1941 08/02/24 0741 08/02/24 1142  GLUCAP 379* 412* 459* 190* 199*   Lipid Profile: No results for input(s): CHOL, HDL, LDLCALC, TRIG, CHOLHDL, LDLDIRECT in the last 72 hours. Thyroid   Function Tests: No results for input(s): TSH, T4TOTAL, FREET4, T3FREE, THYROIDAB in the last 72 hours. Anemia Panel: No results for input(s): VITAMINB12, FOLATE, FERRITIN, TIBC, IRON, RETICCTPCT in the last 72 hours. Sepsis Labs: Recent Labs  Lab 07/28/24 1954  LATICACIDVEN 1.0    Recent Results (from the past 240 hours)  Culture, blood (Routine x 2)     Status: None   Collection Time: 07/28/24  7:54 PM   Specimen: BLOOD  Result Value Ref Range Status   Specimen Description BLOOD BLOOD RIGHT HAND  Final   Special Requests   Final    BOTTLES DRAWN AEROBIC AND ANAEROBIC Blood Culture adequate volume   Culture   Final    NO GROWTH 5 DAYS Performed at [facility], [address] [place], [place] [address]    Report Status 08/02/2024 FINAL  Final  Urine Culture     Status: Abnormal   Collection Time: 07/28/24  8:10 PM   Specimen: Urine, Random  Result Value Ref Range Status   Specimen Description   Final    URINE, RANDOM Performed at [facility], [address] [place], [place] [address]    Special Requests   Final    NONE Reflexed from [id number] Performed at [facility], [address],  [place], [place] [address]    Culture MULTIPLE SPECIES PRESENT, SUGGEST RECOLLECTION (A)  Final   Report Status 07/30/2024 FINAL  Final  Culture, blood (Routine x 2)     Status: None   Collection Time: 07/28/24  8:22 PM   Specimen: BLOOD  Result Value Ref Range Status   Specimen Description BLOOD BLOOD LEFT HAND  Final   Special Requests   Final    AEROBIC BOTTLE ONLY Blood Culture results may not be optimal due to an inadequate volume of blood received in culture bottles   Culture   Final    NO GROWTH 5 DAYS Performed at [facility], [address], [place], [place] [address]    Report Status 08/02/2024 FINAL  Final         Radiology Studies: No results found.        Scheduled Meds:  atorvastatin   10 mg Oral QPM   clopidogrel   75 mg Oral Daily   enoxaparin  (LOVENOX ) injection  40 mg Subcutaneous Q24H   ferrous sulfate   325 mg Oral BID WC   gabapentin   300 mg Oral QHS   insulin  aspart  0-5 Units Subcutaneous QHS   insulin  aspart  0-9 Units Subcutaneous TID WC   insulin  aspart  3 Units Subcutaneous TID WC   insulin  glargine  15 Units Subcutaneous BID   levothyroxine   50 mcg Oral Q0600   LORazepam   0.5 mg Oral BID   senna-docusate  1 tablet Oral BID   sertraline   50 mg Oral Daily   Continuous Infusions:  dextrose  5 % and 0.45 % NaCl with KCl 20 mEq/L     meropenem  (MERREM ) IV 1 g (08/02/24 1240)          Derryl Duval, MD [facility] 08/02/2024, 1:26 PM

## 2024-08-03 LAB — CBC WITH DIFFERENTIAL/PLATELET
Abs Immature Granulocytes: 0.1 K/uL — ABNORMAL HIGH (ref 0.00–0.07)
Basophils Absolute: 0 K/uL (ref 0.0–0.1)
Basophils Relative: 0 %
Eosinophils Absolute: 0.2 K/uL (ref 0.0–0.5)
Eosinophils Relative: 2 %
HCT: 38.7 % (ref 36.0–46.0)
Hemoglobin: 11.7 g/dL — ABNORMAL LOW (ref 12.0–15.0)
Immature Granulocytes: 1 %
Lymphocytes Relative: 11 %
Lymphs Abs: 1.4 K/uL (ref 0.7–4.0)
MCH: 28.7 pg (ref 26.0–34.0)
MCHC: 30.2 g/dL (ref 30.0–36.0)
MCV: 95.1 fL (ref 80.0–100.0)
Monocytes Absolute: 0.7 K/uL (ref 0.1–1.0)
Monocytes Relative: 6 %
Neutro Abs: 10.3 K/uL — ABNORMAL HIGH (ref 1.7–7.7)
Neutrophils Relative %: 80 %
Platelets: 208 K/uL (ref 150–400)
RBC: 4.07 MIL/uL (ref 3.87–5.11)
RDW: 15 % (ref 11.5–15.5)
WBC: 12.8 K/uL — ABNORMAL HIGH (ref 4.0–10.5)
nRBC: 0 % (ref 0.0–0.2)

## 2024-08-03 LAB — GLUCOSE, CAPILLARY
Glucose-Capillary: 214 mg/dL — ABNORMAL HIGH (ref 70–99)
Glucose-Capillary: 187 mg/dL — ABNORMAL HIGH (ref 70–99)
Glucose-Capillary: 106 mg/dL — ABNORMAL HIGH (ref 70–99)
Glucose-Capillary: 83 mg/dL (ref 70–99)

## 2024-08-03 LAB — BASIC METABOLIC PANEL WITH GFR
Anion gap: 9 (ref 5–15)
BUN: 16 mg/dL (ref 8–23)
CO2: 22 mmol/L (ref 22–32)
Calcium: 8.8 mg/dL — ABNORMAL LOW (ref 8.9–10.3)
Chloride: 115 mmol/L — ABNORMAL HIGH (ref 98–111)
Creatinine, Ser: 1.13 mg/dL — ABNORMAL HIGH (ref 0.44–1.00)
GFR, Estimated: 51 mL/min — ABNORMAL LOW (ref >60)
Glucose, Bld: 227 mg/dL — ABNORMAL HIGH (ref 70–99)
Potassium: 3.8 mmol/L (ref 3.5–5.1)
Sodium: 146 mmol/L — ABNORMAL HIGH (ref 135–145)

## 2024-08-03 LAB — MRSA NEXT GEN BY PCR, NASAL: MRSA by PCR Next Gen: DETECTED — AB

## 2024-08-03 MED ORDER — SODIUM CHLORIDE 0.9 % IV SOLN: 2.0000 g | INTRAVENOUS | Status: DC

## 2024-08-03 NOTE — Consult Note (Signed)
 Chief Complaint: Patient was seen in consultation today for  Chief Complaint  Patient presents with   not feeling well   Referring Physician(s): Dr. Maree  Supervising Physician: Dr. Jenna  Patient Status: Dawn Ho - Inpatient   History of Present Illness: Dawn Ho is a 73 y.o. female with a medical history significant for CVA with left hemiparesis, DM2, HTN, chronic anxiety/depression, fibromyalgia and bilateral staghorn calculi, who presented to the [facility] ED 07/28/24 from SNF due to increased fatigue for several days. Upon EMS arrival the patient was hyperglycemic with CBG in the 300s. Patient was admitted with prerenal AKI in the setting of dehydration and was also noted to be hypovolemic and hyponatremic.  She was noted to have UTI with bilateral staghorn calculi and left emphysematous pyelitis. Case was discussed with urology on 12/15 with recommendation for left-sided nephrostomy tube placement. Urology is planning for staged PCNL.   CT chest/abdomen/pelvis 07/31/24 IMPRESSION: 1. Large staghorn calculi in both kidneys, largely filling the renal calices and renal pelves bilaterally. There is associated gas in the left intrarenal collecting system. Imaging features are most suggestive of left-sided emphysematous pyelitis. No definite parenchymal gas at this time to suggest emphysematous pyonephrosis. 2. Asymmetric ground-glass opacity in the right lower lobe is probably atelectasis although component of pneumonia is not excluded. 3. Large stool volume in the rectum. Correlation for signs/symptoms of fecal impaction recommended. 4. Aortic Atherosclerosis (ICD10-I70.0) and Emphysema (ICD10-J43.9).  Interventional Radiology has been asked to evaluate this patient for an image-guided left percutaneous nephrostomy tube. Imaging reviewed and procedure approved by Dr. Jenna.    Past Medical History:  Diagnosis Date   Cataract, left eye    Cerebral infarction  (HCC)    Chronic atrial fibrillation (HCC)    Cognitive communication deficit    CVA (cerebral infarction)    Depression with anxiety    Diabetes mellitus    Dyslipidemia    Dysphagia    Encephalopathy    Fibromyalgia    Hypertension    Retinopathy    Stroke (HCC)    L. hemiparesis   TIA (transient ischemic attack)    Vascular dementia [facility])     Past Surgical History:  Procedure Laterality Date   ABDOMINAL HYSTERECTOMY     age 73, removed one ovary- excessive bleeding   CESAREAN SECTION     CHOLECYSTECTOMY     LOOP RECORDER IMPLANT     Dr. Kelsie placed 10/15/2014   ORIF HIP FRACTURE Right 09/10/2020   Procedure: im nail;  Surgeon: Barbarann Oneil BROCKS, MD;  Location: [facility]S;  Service: Orthopedics;  Laterality: Right;   TEE WITHOUT CARDIOVERSION N/A 10/15/2014   Procedure: TRANSESOPHAGEAL ECHOCARDIOGRAM (TEE);  Surgeon: Vina Okey GAILS, MD;  Location: [facility] ENDOSCOPY;  Service: Cardiovascular;  Laterality: N/A;    Allergies: Penicillins and Iodinated contrast media  Medications: Prior to Admission medications  Medication Sig Start Date End Date Taking? Authorizing Provider  acetaminophen  (TYLENOL ) 325 MG tablet Take 650 mg by mouth every 6 (six) hours as needed for fever or mild pain (pain score 1-3).   Yes [provider]  atorvastatin  (LIPITOR) 10 MG tablet Take 10 mg by mouth every evening. 04/12/23  Yes [provider]  clopidogrel  (PLAVIX ) 75 MG tablet Take 1 tablet (75 mg total) by mouth daily. 10/18/14  Yes Drusilla Sabas RAMAN, MD  estradiol (ESTRACE) 0.01 % CREA vaginal cream Place 1 Applicatorful vaginally every Monday, Wednesday, and Friday.   Yes [provider]  ferrous  sulfate 325 (65 FE) MG tablet Take 325 mg by mouth 2 (two) times daily with a meal.   Yes [provider]  gabapentin  (NEURONTIN ) 300 MG capsule Take 1 capsule (300 mg total) by mouth at bedtime. 10/18/14  Yes Drusilla Sabas RAMAN, MD  gabapentin  (NEURONTIN ) 400 MG capsule Take 400 mg by  mouth daily. 03/12/22  Yes [provider]  insulin  glargine (LANTUS ) 100 UNIT/ML injection Inject 0.1 mLs (10 Units total) into the skin at bedtime. Patient taking differently: Inject 10 Units into the skin 2 (two) times daily. 09/12/20  Yes Dahal, Chapman, MD  insulin  lispro (HUMALOG) 100 UNIT/ML KwikPen Inject 0-12 Units into the skin See admin instructions. Inject 3 times daily with meals per sliding scale:     0 - 150 : 0; 151 - 200 : 2; 201 - 250 : 4; 251 - 300 : 6; 301 - 350 : 8; 351 - 400 : 10; > 401 : 12 units and call provider 06/04/23  Yes [provider]  levothyroxine  (SYNTHROID ) 50 MCG tablet Take 50 mcg by mouth daily before breakfast. 04/12/23  Yes [provider]  LORazepam  (ATIVAN ) 0.5 MG tablet Take 0.5 mg by mouth 2 (two) times daily.   Yes [provider]  MELATONIN PO Take 6 mg by mouth at bedtime.   Yes [provider]  Multiple Vitamins-Minerals (MULTIVITAMIN WITH MINERALS) tablet Take 1 tablet by mouth daily.   Yes [provider]  nitroGLYCERIN  (NITROSTAT ) 0.4 MG SL tablet Place 0.4 mg under the tongue every 5 (five) minutes as needed for chest pain.   Yes [provider]  ondansetron  (ZOFRAN ) 4 MG tablet Take 4 mg by mouth every 8 (eight) hours as needed for nausea or vomiting.   Yes [provider]  sennosides-docusate sodium  (SENOKOT-S) 8.6-50 MG tablet Take 1 tablet by mouth daily.   Yes [provider]  sertraline  (ZOLOFT ) 50 MG tablet Take 50 mg by mouth daily.   Yes [provider]     Family History  Problem Relation Age of Onset   Heart disease Father    Hypertension Sister    Colon cancer Neg Hx    Colon polyps Neg Hx     Social History   Socioeconomic History   Marital status: Married    Spouse name: Not on file   Number of children: 2   Years of education: 16   Highest education level: Not on file  Occupational History   Occupation: retired    Comment:  agricultural engineer   Tobacco Use   Smoking status: Never   Smokeless tobacco: Never  Vaping Use   Vaping status: Unknown  Substance and Sexual Activity   Alcohol use: No   Drug use: No   Sexual activity: Not Currently  Other Topics Concern   Not on file  Social History Narrative   Married, 2 children   Right handed   caffeinne use- occasionally   Social Drivers of Health   Tobacco Use: Low Risk (07/28/2024)   Patient History    Smoking Tobacco Use: Never    Smokeless Tobacco Use: Never    Passive Exposure: Not on file  Financial Resource Strain: Not on file  Food Insecurity: Patient Unable To Answer (07/29/2024)   Epic    Worried About Programme Researcher, Broadcasting/film/video in the Last Year: Patient unable to answer    Ran Out of Food in the Last Year: Patient unable to answer  Transportation Needs: Patient  Unable To Answer (07/29/2024)   Epic    Lack of Transportation (Medical): Patient unable to answer    Lack of Transportation (Non-Medical): Patient unable to answer  Physical Activity: Not on file  Stress: Not on file  Social Connections: Patient Unable To Answer (07/29/2024)   Social Connection and Isolation Panel    Frequency of Communication with Friends and Family: Patient unable to answer    Frequency of Social Gatherings with Friends and Family: Patient unable to answer    Attends Religious Services: Patient unable to answer    Active Member of Clubs or Organizations: Patient unable to answer    Attends Banker Meetings: Patient unable to answer    Marital Status: Patient unable to answer  Depression (PHQ2-9): Not on file  Alcohol Screen: Not on file  Housing: Unknown (07/29/2024)   Epic    Unable to Pay for Housing in the Last Year: Patient unable to answer    Number of Times Moved in the Last Year: 0    Homeless in the Last Year: Patient unable to answer  Utilities: Patient Unable To Answer (07/29/2024)   Epic    Threatened with loss of utilities: Patient unable  to answer  Health Literacy: Not on file    Review of Systems: A 12 point ROS discussed and pertinent positives are indicated in the HPI above.  All other systems are negative.  Review of Systems  Unable to perform ROS: Mental status change    Vital Signs: BP (!) 163/71   Pulse 64   Temp 98.2 F (36.8 C)   Resp 17   Ht 5' (1.524 m)   Wt 139 lb 12.4 oz (63.4 kg)   SpO2 100%   BMI 27.30 kg/m   Physical Exam Constitutional:      General: She is not in acute distress.    Appearance: She is not ill-appearing.  HENT:     Mouth/Throat:     Mouth: Mucous membranes are dry.     Pharynx: Oropharynx is clear.  Cardiovascular:     Rate and Rhythm: Normal rate.  Pulmonary:     Effort: Pulmonary effort is normal.  Skin:    General: Skin is warm and dry.  Neurological:     Mental Status: She is disoriented.     Comments: Patient with eyes open and incomprehensible speech with intermittent moaning. Able to follow a few simple commands.      Imaging:   Labs:  CBC: Recent Labs    07/29/24 0509 07/30/24 0632 08/02/24 0632 08/03/24 0530  WBC 10.8* 9.0 12.4* 12.8*  HGB 11.8* 11.7* 10.9* 11.7*  HCT 38.8 39.9 35.0* 38.7  PLT 304 263 207 208    COAGS: Recent Labs    07/28/24 1954  INR 1.1    BMP: Recent Labs    07/31/24 0817 08/01/24 0508 08/01/24 2234 08/02/24 0632 08/03/24 0530  NA 153* 150*  --  148* 146*  K 3.1* 3.3*  --  3.5 3.8  CL 119* 117*  --  117* 115*  CO2 24 24  --  24 22  GLUCOSE 253* 224* 364* 194* 227*  BUN 20 22  --  17 16  CALCIUM  9.1 9.1  --  8.6* 8.8*  CREATININE 1.39* 1.35*  --  1.18* 1.13*  GFRNONAA 40* 41*  --  48* 51*    LIVER FUNCTION TESTS: Recent Labs    07/28/24 1954  BILITOT 0.3  AST 13*  ALT 10  ALKPHOS  103  PROT 7.8  ALBUMIN 3.5    TUMOR MARKERS: No results for input(s): AFPTM, CEA, CA199, CHROMGRNA in the last 8760 hours.  Assessment and Plan:  Bilateral staghorn calculi and left emphysematous  pyelitis: Dawn Ho. Dawn Ho, 73 year old female, is tentatively scheduled 08/07/24 for an image-guided left percutaneous nephrostomy placement. Procedure scheduled for 08/07/24 to allow for Plavix  washout. The procedure was discussed with the patient's son and telephone consent was obtained. The Dawn Ho team will arrange for [facility] transportation to [facility] Friday morning. Patient will need to arrive to [facility] by 7:30-8 am. She will be transported back to [facility] post-procedure.   Risks and benefits of Left PCN placement was discussed with the patient including, but not limited to, infection, bleeding, significant bleeding causing loss or decrease in renal function or damage to adjacent structures.   All of the patient's questions were answered, patient is agreeable to proceed. She will be NPO at midnight Friday 12/19. Labs will be obtained that morning.   Consent signed and in IR.  Thank you for this interesting consult.  I greatly enjoyed meeting Dawn Ho and look forward to participating in their care.  A copy of this report was sent to the requesting provider on this date.  Electronically Signed: Warren Dais, AGACNP-BC 08/03/2024, 10:59 AM   I spent a total of 20 Minutes    in face to face in clinical consultation, greater than 50% of which was counseling/coordinating care for left emphysematous pyelitis.

## 2024-08-03 NOTE — Plan of Care (Signed)

## 2024-08-03 NOTE — Progress Notes (Signed)
 PROGRESS NOTE    Dawn Ho  FMW:[id number] DOB: [birth year] DOA: 07/28/2024 PCP: Celine Erla Bong, MD   Brief Narrative:    Dawn Ho is a 73 y.o. female with medical history significant for CVA with left hemiparesis, type 2 diabetes, hypertension, hyperlipidemia, chronic anxiety/depression, fibromyalgia, bilateral staghorn calculi, who presents to the ER from SNF due to increased fatigue for the past few days.  The patient is minimally verbal.  EMS was activated.  Upon EMS arrival the patient was hyperglycemic with CBG in the 300s.  Patient was admitted with prerenal AKI in the setting of dehydration and was also noted to be hypovolemic and hyponatremic.  She is noted to have UTI with bilateral staghorn calculi and left emphysematous pyelitis.  She was switched to Merrem  for antibiotic treatment given persistent fevers on 12/12.  Case was discussed with urology on 12/15 with recommendation for left-sided nephrostomy tube placement and IR has been consulted.  Assessment & Plan:   Principal Problem:   AKI (acute kidney injury) Active Problems:   Diabetes mellitus (HCC)   History of TIAs   H/O: CVA (cerebrovascular accident)   Hypernatremia   Dehydration   Hypothyroidism  Assessment and Plan:   AKI, prerenal in the setting of dehydration from poor oral intake. Patient from SNF. Creatinine 1.73 on presentation, improving to 1.1, baseline is 0.7.  IV fluid is below CT shows bilateral staghorn calculi with no hydronephrosis. Appreciate urology recommendations for nephrostomy tube placement per IR.   Hypernatremia, hypovolemic in the setting of poor oral intake. Presented with serum sodium of 149 and chloride 113 Sodium initially worsened to 153->149->153->150->148 overnight.   - Will continue with D5W half patient normal saline plus Kcl Repeat labs in the a.m.   Bilateral staghorn calculi with left emphysematous pyelitis:   CT abdomen pelvis 12/12 shows bilateral  staghorn calculi with left emphysematous pyelitis.  Patient received IV ceftriaxone  for 3 doses +1-day of cefepime .  Will switch to meropenem  based on previous culture.  New UA obtained from 12/12 is positive, will follow cultures - Patient has history of known kidney stones on both sides and has seen urology in the past but looks like lost follow-up Patient denies abdominal pain and is nontender to exam -Will monitor fever curve.  She is afebrile for >24 hours now -Appreciate IR consultation for nephrostomy tube placement when appropriate     Type 2 diabetes with hyperglycemia Presented with serum glucose of 306 Last hemoglobin A1c 8.3 on 06/04/2023 Heart healthy carb modified diet Insulin  coverage, long-acting and short acting.  Lantus  currently 10 units twice daily, will decrease the dose once we take her off dextrose  infusion   Generalized weakness PT OT evaluation with recommendation for SNF on discharge Fall precautions.   History of prior CVA Resume home regimen Prior to admission on Plavix  and Lipitor.   Hypothyroidism Resume home levothyroxine .   Chronic anxiety/depression Resume home regimen.    DVT prophylaxis:Lovenox  Code Status: Full Family Communication: Discussed with son on phone 12/15 Disposition Plan:  Status is: Inpatient Remains inpatient appropriate because: Need for IV meds and neo  Consultants:  Discussed with urology 12/15 Dr. Sherrilee IR  Procedures:  None  Antimicrobials:  Anti-infectives (From admission, onward)    Start     Dose/Rate Route Frequency Ordered Stop   08/01/24 1000  meropenem  (MERREM ) 1 g in sodium chloride  0.9 % 100 mL IVPB        1 g 200 mL/hr over 30 Minutes Intravenous  Every 12 hours 08/01/24 0910     08/01/24 0915  ertapenem (INVANZ) 1 g in sodium chloride  0.9 % 100 mL IVPB  Status:  Discontinued        1 g 200 mL/hr over 30 Minutes Intravenous Every 24 hours 08/01/24 0829 08/01/24 0910   07/31/24 1130  ceFEPIme   (MAXIPIME ) 2 g in sodium chloride  0.9 % 100 mL IVPB  Status:  Discontinued        2 g 200 mL/hr over 30 Minutes Intravenous Every 12 hours 07/31/24 1055 08/01/24 0829   07/29/24 1400  cefTRIAXone  (ROCEPHIN ) 2 g in sodium chloride  0.9 % 100 mL IVPB  Status:  Discontinued        2 g 200 mL/hr over 30 Minutes Intravenous Every 24 hours 07/29/24 0122 07/31/24 0737   07/28/24 2000  cefTRIAXone  (ROCEPHIN ) 2 g in sodium chloride  0.9 % 100 mL IVPB        2 g 200 mL/hr over 30 Minutes Intravenous  Once 07/28/24 1958 07/28/24 2211       Subjective: Patient seen and evaluated today with no new acute complaints or concerns. No acute concerns or events noted overnight.  Objective: Vitals:   08/02/24 0452 08/02/24 1340 08/02/24 1933 08/03/24 0544  BP: 139/69 137/82 (!) 140/69 (!) 163/71  Pulse: 78 86 70 64  Resp: 17 19 18 17   Temp: 98.9 F (37.2 C) 99 F (37.2 C) 98.3 F (36.8 C) 98.2 F (36.8 C)  TempSrc: Oral Oral    SpO2: 99% 98% 100% 100%  Weight:      Height:        Intake/Output Summary (Last 24 hours) at 08/03/2024 1037 Last data filed at 08/03/2024 0946 Gross per 24 hour  Intake 1348.33 ml  Output 850 ml  Net 498.33 ml   Filed Weights   07/28/24 1927 07/29/24 0053 08/01/24 0511  Weight: 74 kg 63.4 kg 63.4 kg    Examination:  General exam: Appears calm and comfortable  Respiratory system: Clear to auscultation. Respiratory effort normal. Cardiovascular system: S1 & S2 heard, RRR.  Gastrointestinal system: Abdomen is soft Central nervous system: Alert and awake Extremities: No edema Skin: No significant lesions noted Psychiatry: Flat affect.    Data Reviewed: I have personally reviewed following labs and imaging studies  CBC: Recent Labs  Lab 07/28/24 1954 07/29/24 0509 07/30/24 0632 08/02/24 0632 08/03/24 0530  WBC 9.1 10.8* 9.0 12.4* 12.8*  NEUTROABS 6.6  --  5.6  --  10.3*  HGB 12.6 11.8* 11.7* 10.9* 11.7*  HCT 40.4 38.8 39.9 35.0* 38.7  MCV 91.6  93.5 95.9 92.1 95.1  PLT 343 304 263 207 208   Basic Metabolic Panel: Recent Labs  Lab 07/29/24 0509 07/30/24 0632 07/31/24 0817 08/01/24 0508 08/01/24 2234 08/02/24 0632 08/03/24 0530  NA 153* 149* 153* 150*  --  148* 146*  K 3.7 3.4* 3.1* 3.3*  --  3.5 3.8  CL 117* 116* 119* 117*  --  117* 115*  CO2 23 23 24 24   --  24 22  GLUCOSE 306* 337* 253* 224* 364* 194* 227*  BUN 33* 31* 20 22  --  17 16  CREATININE 1.64* 1.46* 1.39* 1.35*  --  1.18* 1.13*  CALCIUM  9.0 8.9 9.1 9.1  --  8.6* 8.8*  MG 2.4  --   --   --   --   --   --   PHOS 3.7  --   --   --   --   --   --  GFR: Estimated Creatinine Clearance: 36.3 mL/min (A) (by C-G formula based on SCr of 1.13 mg/dL (H)). Liver Function Tests: Recent Labs  Lab 07/28/24 1954  AST 13*  ALT 10  ALKPHOS 103  BILITOT 0.3  PROT 7.8  ALBUMIN 3.5   No results for input(s): LIPASE, AMYLASE in the last 168 hours. No results for input(s): AMMONIA in the last 168 hours. Coagulation Profile: Recent Labs  Lab 07/28/24 1954  INR 1.1   Cardiac Enzymes: No results for input(s): CKTOTAL, CKMB, CKMBINDEX, TROPONINI in the last 168 hours. BNP (last 3 results) No results for input(s): PROBNP in the last 8760 hours. HbA1C: No results for input(s): HGBA1C in the last 72 hours. CBG: Recent Labs  Lab 08/02/24 0741 08/02/24 1142 08/02/24 1637 08/02/24 1935 08/03/24 0738  GLUCAP 190* 199* 162* 214* 214*   Lipid Profile: No results for input(s): CHOL, HDL, LDLCALC, TRIG, CHOLHDL, LDLDIRECT in the last 72 hours. Thyroid  Function Tests: No results for input(s): TSH, T4TOTAL, FREET4, T3FREE, THYROIDAB in the last 72 hours. Anemia Panel: No results for input(s): VITAMINB12, FOLATE, FERRITIN, TIBC, IRON, RETICCTPCT in the last 72 hours. Sepsis Labs: Recent Labs  Lab 07/28/24 1954  LATICACIDVEN 1.0    Recent Results (from the past 240 hours)  Culture, blood (Routine x 2)      Status: None   Collection Time: 07/28/24  7:54 PM   Specimen: BLOOD  Result Value Ref Range Status   Specimen Description BLOOD BLOOD RIGHT HAND  Final   Special Requests   Final    BOTTLES DRAWN AEROBIC AND ANAEROBIC Blood Culture adequate volume   Culture   Final    NO GROWTH 5 DAYS Performed at [facility], [address], [place], [place] [address]    Report Status 08/02/2024 FINAL  Final  Urine Culture     Status: Abnormal   Collection Time: 07/28/24  8:10 PM   Specimen: Urine, Random  Result Value Ref Range Status   Specimen Description   Final    URINE, RANDOM Performed at [facility], [address] [place], [place] [address]    Special Requests   Final    NONE Reflexed from [phone] [phone] Performed at [facility], [address], [place], [place] [address]    Culture MULTIPLE SPECIES PRESENT, SUGGEST RECOLLECTION (A)  Final   Report Status 07/30/2024 FINAL  Final  Culture, blood (Routine x 2)     Status: None   Collection Time: 07/28/24  8:22 PM   Specimen: BLOOD  Result Value Ref Range Status   Specimen Description BLOOD BLOOD LEFT HAND  Final   Special Requests   Final    AEROBIC BOTTLE ONLY Blood Culture results may not be optimal due to an inadequate volume of blood received in culture bottles   Culture   Final    NO GROWTH 5 DAYS Performed at [facility], [address] [place], [place] [address]    Report Status 08/02/2024 FINAL  Final         Radiology Studies: No results found.      Scheduled Meds:  atorvastatin   10 mg Oral QPM   clopidogrel   75 mg Oral Daily   enoxaparin  (LOVENOX ) injection  40 mg Subcutaneous Q24H   ferrous sulfate   325 mg Oral BID WC   gabapentin   300 mg Oral QHS   insulin  aspart  0-5 Units Subcutaneous QHS   insulin  aspart  0-9 Units Subcutaneous TID WC   insulin  aspart  3 Units Subcutaneous  TID WC   insulin  glargine  15 Units Subcutaneous BID   levothyroxine   50 mcg Oral Q0600   LORazepam   0.5 mg Oral BID   senna-docusate   1 tablet Oral BID   sertraline   50 mg Oral Daily   Continuous Infusions:  dextrose  5 % and 0.45 % NaCl with KCl 20 mEq/L 100 mL/hr at 08/03/24 0912   meropenem  (MERREM ) IV 1 g (08/03/24 0835)     LOS: 5 days    Time spent: 55 minutes    Blade Scheff JONETTA Fairly, DO [facility]  If 7PM-7AM, please contact night-coverage [email or web address] 08/03/2024, 10:37 AM

## 2024-08-03 NOTE — Plan of Care (Signed)
   Problem: Education: Goal: Knowledge of General Education information will improve Description Including pain rating scale, medication(s)/side effects and non-pharmacologic comfort measures Outcome: Progressing

## 2024-08-04 LAB — BASIC METABOLIC PANEL WITH GFR
Anion gap: 7 (ref 5–15)
BUN: 14 mg/dL (ref 8–23)
CO2: 26 mmol/L (ref 22–32)
Calcium: 9 mg/dL (ref 8.9–10.3)
Chloride: 117 mmol/L — ABNORMAL HIGH (ref 98–111)
Creatinine, Ser: 1.12 mg/dL — ABNORMAL HIGH (ref 0.44–1.00)
GFR, Estimated: 51 mL/min — ABNORMAL LOW (ref >60)
Glucose, Bld: 56 mg/dL — ABNORMAL LOW (ref 70–99)
Potassium: 3.3 mmol/L — ABNORMAL LOW (ref 3.5–5.1)
Sodium: 150 mmol/L — ABNORMAL HIGH (ref 135–145)

## 2024-08-04 LAB — CBC
HCT: 37.9 % (ref 36.0–46.0)
Hemoglobin: 11.7 g/dL — ABNORMAL LOW (ref 12.0–15.0)
MCH: 28.8 pg (ref 26.0–34.0)
MCHC: 30.9 g/dL (ref 30.0–36.0)
MCV: 93.3 fL (ref 80.0–100.0)
Platelets: 186 K/uL (ref 150–400)
RBC: 4.06 MIL/uL (ref 3.87–5.11)
RDW: 15.5 % (ref 11.5–15.5)
WBC: 11.7 K/uL — ABNORMAL HIGH (ref 4.0–10.5)
nRBC: 0 % (ref 0.0–0.2)

## 2024-08-04 LAB — URINE CULTURE: Culture: >=100000 — AB

## 2024-08-04 LAB — GLUCOSE, CAPILLARY
Glucose-Capillary: 53 mg/dL — ABNORMAL LOW (ref 70–99)
Glucose-Capillary: 76 mg/dL (ref 70–99)
Glucose-Capillary: 135 mg/dL — ABNORMAL HIGH (ref 70–99)
Glucose-Capillary: 254 mg/dL — ABNORMAL HIGH (ref 70–99)
Glucose-Capillary: 296 mg/dL — ABNORMAL HIGH (ref 70–99)

## 2024-08-04 LAB — MAGNESIUM: Magnesium: 2.3 mg/dL (ref 1.7–2.4)

## 2024-08-04 MED ORDER — POTASSIUM CL IN DEXTROSE 5% 20 MEQ/L IV SOLN
20.0000 meq | INTRAVENOUS | Status: AC
  Administered 2024-08-04 – 2024-08-05 (×3): 20 meq via INTRAVENOUS

## 2024-08-04 MED ORDER — GLUCERNA SHAKE PO LIQD
237.0000 mL | Freq: Three times a day (TID) | ORAL | Status: DC
  Administered 2024-08-04 – 2024-08-08 (×8): 237 mL via ORAL

## 2024-08-04 MED ORDER — ADULT MULTIVITAMIN W/MINERALS CH
1.0000 | ORAL_TABLET | Freq: Every day | ORAL | Status: DC
  Administered 2024-08-04 – 2024-08-08 (×4): 1 via ORAL
  Filled 2024-08-04 (×4): qty 1

## 2024-08-04 NOTE — Plan of Care (Signed)

## 2024-08-04 NOTE — Progress Notes (Signed)
 Occupational Therapy Treatment Patient Details Name: Dawn Ho MRN: [id number] DOB: 1951/01/30 Today's Date: 08/04/2024   History of present illness Dawn Ho is a 73 y.o. female with medical history significant for CVA with left hemiparesis, type 2 diabetes, hypertension, hyperlipidemia, chronic anxiety/depression, fibromyalgia, bilateral staghorn calculi, who presents to the ER from SNF due to increased fatigue for the past few days.  The patient is minimally verbal.  EMS was activated.  Upon EMS arrival the patient was hyperglycemic with CBG in the 300s.     In the ER, alert and confused, hypertensive and tachypneic.  Sinus tachycardia with rate of 116 and QTc of 487 on 12-lead EKG.       Lab studies notable for serum sodium of 149, bicarb 19, glucose 306, anion gap 17, GFR 30, BUN 34 and creatinine 1.73.  UA positive for pyuria.   OT comments  Pt agreeable to OT and PT co-treatment. Pt required max A for rolling and max to total assist to sit at EOB and return to supine. Pt unable to maintain balance without additional physical support. Pt required total assist for peri-care to clean after a bowel movement while supine in bed. Pt left in the bed with bed alarm set. Pt will benefit from continued OT in the hospital to increase strength, balance, and endurance for safe ADL's.         If plan is discharge home, recommend the following:  Two people to help with walking and/or transfers;A lot of help with bathing/dressing/bathroom;Assistance with cooking/housework;Assistance with feeding;Direct supervision/assist for medications management;Direct supervision/assist for financial management;Assist for transportation;Help with stairs or ramp for entrance   Equipment Recommendations  None recommended by OT          Precautions / Restrictions Precautions Precautions: Fall Recall of Precautions/Restrictions: Impaired Restrictions Weight Bearing Restrictions Per Provider Order: No        Mobility Bed Mobility Overal bed mobility: Needs Assistance Bed Mobility: Rolling, Supine to Sit, Sit to Supine Rolling: Max assist   Supine to sit: Max assist, HOB elevated Sit to supine: Max assist   General bed mobility comments: Much assist; poor trunk control; rolling with assist during peri-care.    Transfers Overall transfer level: Needs assistance Equipment used: Rolling walker (2 wheels)               General transfer comment: Partial stand at EOB without AD; Max to total assist. Pt using R UE on RW.     Balance Overall balance assessment: Needs assistance Sitting-balance support: Feet supported, Single extremity supported Sitting balance-Leahy Scale: Poor Sitting balance - Comments: seated at EOB                                   ADL either performed or assessed with clinical judgement   ADL Overall ADL's : Needs assistance/impaired                     Lower Body Dressing: Total assistance;Bed level Lower Body Dressing Details (indicate cue type and reason): Assited for peri-care after a bed level bowel movement.                     Communication Communication Communication: Impaired Factors Affecting Communication: Difficulty expressing self;Reduced clarity of speech   Cognition Arousal: Alert Behavior During Therapy: WFL for tasks assessed/performed Cognition: No family/caregiver present to determine baseline  Following commands: Impaired Following commands impaired: Follows one step commands inconsistently      Cueing   Cueing Techniques: Verbal cues, Tactile cues, Visual cues, Gestural cues  Exercises                   Pertinent Vitals/ Pain       Pain Assessment Pain Assessment: Faces Faces Pain Scale: Hurts little more Pain Location: Moaning with bed mobility. Pain Descriptors / Indicators: Moaning, Grimacing Pain Intervention(s): Monitored during session,  Repositioned                                                          Frequency  Min 1X/week        Progress Toward Goals  OT Goals(current goals can now be found in the care plan section)  Progress towards OT goals: Progressing toward goals  Acute Rehab OT Goals Patient Stated Goal: None stated. OT Goal Formulation: Patient unable to participate in goal setting Time For Goal Achievement: 08/12/24 Potential to Achieve Goals: Fair ADL Goals Pt Will Perform Eating: with set-up;sitting Pt Will Perform Grooming: with contact guard assist;with min assist;sitting Pt Will Perform Upper Body Bathing: with min assist;with mod assist;sitting Pt Will Perform Upper Body Dressing: with mod assist;with min assist;bed level;sitting Pt Will Transfer to Toilet: with mod assist;stand pivot transfer Pt/caregiver will Perform Home Exercise Program: Increased ROM;Increased strength;Left upper extremity;Right Upper extremity;With minimal assist  Plan      Co-evaluation    PT/OT/SLP Co-Evaluation/Treatment: Yes Reason for Co-Treatment: To address functional/ADL transfers;Complexity of the patient's impairments (multi-system involvement)   OT goals addressed during session: ADL's and self-care                         End of Session Equipment Utilized During Treatment: Gait belt;Rolling walker (2 wheels)  OT Visit Diagnosis: Unsteadiness on feet (R26.81);Other abnormalities of gait and mobility (R26.89);Muscle weakness (generalized) (M62.81)   Activity Tolerance Patient tolerated treatment well   Patient Left in bed;with call bell/phone within reach;with bed alarm set   Nurse Communication          Time: 1337-1410 OT Time Calculation (min): 33 min  Charges: OT General Charges $OT Visit: 1 Visit OT Treatments $Self Care/Home Management : 8-22 mins (One unit taken due to co-treat with PT.)  JAYSON PERSON OT, MOT   Jayson Person 08/04/2024, 3:29 PM

## 2024-08-04 NOTE — Progress Notes (Signed)
 Physical Therapy Treatment Patient Details Name: Dawn Ho MRN: [id number] DOB: 01-23-1951 Today's Date: 08/04/2024   History of Present Illness Dawn Ho is a 73 y.o. female with medical history significant for CVA with left hemiparesis, type 2 diabetes, hypertension, hyperlipidemia, chronic anxiety/depression, fibromyalgia, bilateral staghorn calculi, who presents to the ER from SNF due to increased fatigue for the past few days.  The patient is minimally verbal.  EMS was activated.  Upon EMS arrival the patient was hyperglycemic with CBG in the 300s.     In the ER, alert and confused, hypertensive and tachypneic.  Sinus tachycardia with rate of 116 and QTc of 487 on 12-lead EKG.       Lab studies notable for serum sodium of 149, bicarb 19, glucose 306, anion gap 17, GFR 30, BUN 34 and creatinine 1.73.  UA positive for pyuria.    PT Comments  Patient appears agreeable to OT/PT co-treatment session. Patient received supine in bed. Patient continues to require max assistance with bed mobility and during an attempt for functional transfer. Pt unable to fully stand with max/total assist this date. Patient becomes incontinent during mobility. Returned pt to supine with max assist. Max assist for rolling during pericare. Patient with difficulty with following commands during session and remains unable to verbally respond appropriately, but noted increase in overall vocal responses. Pt left in bed with bed alarm set, call button in reach, and all needs met. Patient will benefit from continued skilled physical therapy acutely and in recommended venue in order to address current deficits and improve overall function.     If plan is discharge home, recommend the following: Two people to help with walking and/or transfers;A lot of help with bathing/dressing/bathroom;Assistance with feeding   Can travel by private vehicle        Equipment Recommendations  None recommended by PT    Recommendations for  Other Services       Precautions / Restrictions Precautions Precautions: Fall Recall of Precautions/Restrictions: Impaired Restrictions Weight Bearing Restrictions Per Provider Order: No     Mobility  Bed Mobility Overal bed mobility: Needs Assistance Bed Mobility: Rolling, Supine to Sit, Sit to Supine Rolling: Max assist   Supine to sit: Max assist, HOB elevated Sit to supine: Max assist   General bed mobility comments: max/total assist, pt unable to initiate with verbal cueing, assist with moving RLE to EOB with pt able to slightly bring LLE over this date, assisted to shift shoulders in opposite direction, much assist for trunk elevation and control while sitting EOB, rolling assist for pericare once returned to bed    Transfers Overall transfer level: Needs assistance Equipment used: Rolling walker (2 wheels) Transfers: Sit to/from Stand Sit to Stand: Max assist, Total assist           General transfer comment: partial stand with OT, pt unable to assist, R hand supported on RW/bed railing throughout    Ambulation/Gait               General Gait Details: unsafe to perform this date, pt at w/c level at baseline   Stairs             Wheelchair Mobility     Tilt Bed    Modified Rankin (Stroke Patients Only)       Balance Overall balance assessment: Needs assistance Sitting-balance support: Feet supported, Single extremity supported Sitting balance-Leahy Scale: Poor Sitting balance - Comments: seated at EOB   Standing balance support: Single  extremity supported, During functional activity Standing balance-Leahy Scale: Zero Standing balance comment: max/total assist to stand at bedside                            Communication Communication Communication: Impaired Factors Affecting Communication: Difficulty expressing self;Reduced clarity of speech  Cognition Arousal: Alert Behavior During Therapy: [facility] for tasks  assessed/performed   PT - Cognitive impairments: Difficult to assess, History of cognitive impairments Difficult to assess due to: Impaired communication                       Following commands: Impaired Following commands impaired: Follows one step commands inconsistently    Cueing Cueing Techniques: Verbal cues, Tactile cues, Visual cues, Gestural cues  Exercises      General Comments        Pertinent Vitals/Pain Pain Assessment Pain Assessment: Faces Faces Pain Scale: Hurts little more Pain Location: Moaning with bed mobility. Pain Descriptors / Indicators: Moaning, Grimacing Pain Intervention(s): Limited activity within patient's tolerance, Repositioned, Monitored during session    Home Living                          Prior Function            PT Goals (current goals can now be found in the care plan section) Acute Rehab PT Goals Patient Stated Goal: pt unable to verbally express, would rec return to SNF with therapy services PT Goal Formulation: With patient Time For Goal Achievement: 08/05/24 Potential to Achieve Goals: Good Progress towards PT goals: Progressing toward goals    Frequency    Min 3X/week      PT Plan      Co-evaluation PT/OT/SLP Co-Evaluation/Treatment: Yes Reason for Co-Treatment: To address functional/ADL transfers PT goals addressed during session: Mobility/safety with mobility OT goals addressed during session: ADL's and self-care      AM-PAC PT 6 Clicks Mobility   Outcome Measure  Help needed turning from your back to your side while in a flat bed without using bedrails?: A Lot Help needed moving from lying on your back to sitting on the side of a flat bed without using bedrails?: A Lot Help needed moving to and from a bed to a chair (including a wheelchair)?: A Lot Help needed standing up from a chair using your arms (e.g., wheelchair or bedside chair)?: Total Help needed to walk in hospital room?:  Total Help needed climbing 3-5 steps with a railing? : Total 6 Click Score: 9    End of Session Equipment Utilized During Treatment: Gait belt Activity Tolerance: Patient limited by pain;Patient limited by fatigue Patient left: in bed;with call bell/phone within reach;with bed alarm set   PT Visit Diagnosis: Other abnormalities of gait and mobility (R26.89);Muscle weakness (generalized) (M62.81);Hemiplegia and hemiparesis Hemiplegia - Right/Left: Left Hemiplegia - caused by: Cerebral infarction     Time: 8661-8590 PT Time Calculation (min) (ACUTE ONLY): 31 min  Charges:    $Therapeutic Activity: 8-22 mins (1 unit due to co-treat with OT) PT General Charges $$ ACUTE PT VISIT: 1 Visit                      3:59 PM, 08/04/2024 Lyndal Reggio Powell-Butler, PT, DPT [place] with [facility]

## 2024-08-04 NOTE — Consult Note (Signed)
 Urology Consult  Referring physician: Dr., Maree Reason for referral: bilateral staghorn calculi  Chief Complaint: bilateral renal calculi  History of Present Illness: Dawn Ho is a 73yo with a history of CVA, DMII, minimally verbal who presented from her SNF with increased fatigue and dehydration. 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 She has been having intermittent fevers since admission and was originally treated with rocephin  which has since been changed to meropenem . She is unable to express flank pain and she cannot express any change in her urination.   Past Medical History:  Diagnosis Date   Cataract, left eye    Cerebral infarction (HCC)    Chronic atrial fibrillation (HCC)    Cognitive communication deficit    CVA (cerebral infarction)    Depression with anxiety    Diabetes mellitus    Dyslipidemia    Dysphagia    Encephalopathy    Fibromyalgia    Hypertension    Retinopathy    Stroke (HCC)    L. hemiparesis   TIA (transient ischemic attack)    Vascular dementia [facility])    Past Surgical History:  Procedure Laterality Date   ABDOMINAL HYSTERECTOMY     age 80, removed one ovary- excessive bleeding   CESAREAN SECTION     CHOLECYSTECTOMY     LOOP RECORDER IMPLANT     Dr. Kelsie placed 10/15/2014   ORIF HIP FRACTURE Right 09/10/2020   Procedure: im nail;  Surgeon: Barbarann Oneil BROCKS, MD;  Location: [facility];  Service: Orthopedics;  Laterality: Right;   TEE WITHOUT CARDIOVERSION N/A 10/15/2014   Procedure: TRANSESOPHAGEAL ECHOCARDIOGRAM (TEE);  Surgeon: Vina Okey GAILS, MD;  Location: [facility] ENDOSCOPY;  Service: Cardiovascular;  Laterality: N/A;    Medications: I have reviewed the patient's current medications. Allergies: Allergies[1]  Family History  Problem Relation Age of Onset   Heart disease Father    Hypertension Sister    Colon cancer Neg Hx    Colon polyps Neg Hx    Social  History:  reports that she has never smoked. She has never used smokeless tobacco. She reports that she does not drink alcohol and does not use drugs.  Review of Systems  Unable to perform ROS: Dementia    Physical Exam:  Vital signs in last 24 hours: Temp:  [97.4 F (36.3 C)-98.7 F (37.1 C)] 98.7 F (37.1 C) (12/16 0457) Pulse Rate:  [62-72] 62 (12/16 0457) Resp:  [16-18] 17 (12/16 0457) BP: (124-133)/(51-72) 130/52 (12/16 0457) SpO2:  [97 %-100 %] 97 % (12/16 0457) Physical Exam Vitals reviewed.  HENT:     Head: Normocephalic and atraumatic.     Nose: Nose normal.     Mouth/Throat:     Mouth: Mucous membranes are dry.  Eyes:     Extraocular Movements: Extraocular movements intact.  Cardiovascular:     Rate and Rhythm: Normal rate and regular rhythm.  Pulmonary:     Effort: Pulmonary effort is normal. No respiratory distress.  Abdominal:     General: Abdomen is flat. There is no distension.  Musculoskeletal:        General: No swelling. Normal range of motion.     Cervical back: Normal range of motion and neck supple.  Skin:    General: Skin is warm and dry.  Neurological:     Mental Status: Mental status is at baseline.     Laboratory Data:  Results for orders  placed or performed during the hospital encounter of 07/28/24 (from the past 72 hours)  Glucose, capillary     Status: Abnormal   Collection Time: 08/01/24 11:27 AM  Result Value Ref Range   Glucose-Capillary 379 (H) 70 - 99 mg/dL    Comment: Glucose reference range applies only to samples taken after fasting for at least 8 hours.  Glucose, capillary     Status: Abnormal   Collection Time: 08/01/24  4:32 PM  Result Value Ref Range   Glucose-Capillary 412 (H) 70 - 99 mg/dL    Comment: Glucose reference range applies only to samples taken after fasting for at least 8 hours.  Glucose, capillary     Status: Abnormal   Collection Time: 08/01/24  7:41 PM  Result Value Ref Range   Glucose-Capillary 459 (H) 70  - 99 mg/dL    Comment: Glucose reference range applies only to samples taken after fasting for at least 8 hours.  Glucose, random     Status: Abnormal   Collection Time: 08/01/24 10:34 PM  Result Value Ref Range   Glucose, Bld 364 (H) 70 - 99 mg/dL    Comment: Glucose reference range applies only to samples taken after fasting for at least 8 hours. Performed at [facility], [address] [place], [place] [address]   Basic metabolic panel     Status: Abnormal   Collection Time: 08/02/24  6:32 AM  Result Value Ref Range   Sodium 148 (H) 135 - 145 mmol/L   Potassium 3.5 3.5 - 5.1 mmol/L   Chloride 117 (H) 98 - 111 mmol/L   CO2 24 22 - 32 mmol/L   Glucose, Bld 194 (H) 70 - 99 mg/dL    Comment: Glucose reference range applies only to samples taken after fasting for at least 8 hours.   BUN 17 8 - 23 mg/dL   Creatinine, Ser 8.81 (H) 0.44 - 1.00 mg/dL   Calcium  8.6 (L) 8.9 - 10.3 mg/dL   GFR, Estimated 48 (L) >60 mL/min    Comment: (NOTE) Calculated using the CKD-EPI Creatinine Equation (2021)    Anion gap 7 5 - 15    Comment: Performed at [facility], [address], [place], [place] [address]  CBC     Status: Abnormal   Collection Time: 08/02/24  6:32 AM  Result Value Ref Range   WBC 12.4 (H) 4.0 - 10.5 K/uL   RBC 3.80 (L) 3.87 - 5.11 MIL/uL   Hemoglobin 10.9 (L) 12.0 - 15.0 g/dL   HCT 64.9 (L) 63.9 - 53.9 %   MCV 92.1 80.0 - 100.0 fL   MCH 28.7 26.0 - 34.0 pg   MCHC 31.1 30.0 - 36.0 g/dL   RDW 84.9 88.4 - 84.4 %   Platelets 207 150 - 400 K/uL   nRBC 0.0 0.0 - 0.2 %    Comment: Performed at [facility], [address] [place], [place] [address]  Glucose, capillary     Status: Abnormal   Collection Time: 08/02/24  7:41 AM  Result Value Ref Range   Glucose-Capillary 190 (H) 70 - 99 mg/dL    Comment: Glucose reference range applies only to samples taken after fasting for at least 8 hours.  Glucose, capillary     Status: Abnormal   Collection Time: 08/02/24 11:42 AM  Result  Value Ref Range   Glucose-Capillary 199 (H) 70 - 99 mg/dL    Comment: Glucose reference range applies only to samples taken after fasting for at  least 8 hours.   Comment 1 Notify RN    Comment 2 Document in Chart   Urinalysis, w/ Reflex to Culture (Infection Suspected) -Urine, Catheterized     Status: Abnormal   Collection Time: 08/02/24  1:18 PM  Result Value Ref Range   Specimen Source URINE, CATHETERIZED    Color, Urine YELLOW YELLOW   APPearance HAZY (A) CLEAR   Specific Gravity, Urine 1.014 1.005 - 1.030   pH 6.0 5.0 - 8.0   Glucose, UA 150 (A) NEGATIVE mg/dL   Hgb urine dipstick MODERATE (A) NEGATIVE   Bilirubin Urine NEGATIVE NEGATIVE   Ketones, ur NEGATIVE NEGATIVE mg/dL   Protein, ur 899 (A) NEGATIVE mg/dL   Nitrite POSITIVE (A) NEGATIVE   Leukocytes,Ua LARGE (A) NEGATIVE   RBC / HPF 21-50 0 - 5 RBC/hpf   WBC, UA >50 0 - 5 WBC/hpf    Comment:        Reflex urine culture not performed if WBC <=10, OR if Squamous epithelial cells >5. If Squamous epithelial cells >5 suggest recollection.    Bacteria, UA MANY (A) NONE SEEN   Squamous Epithelial / HPF 0-5 0 - 5 /HPF   Mucus PRESENT     Comment: Performed at [facility], [address] [place] [address], [place], [place] [address]  Urine Culture     Status: Abnormal   Collection Time: 08/02/24  1:18 PM   Specimen: Urine, Random  Result Value Ref Range   Specimen Description      URINE, RANDOM Performed at [facility], [address], [place], [place] [address]    Special Requests      NONE Reflexed from [id number] Performed at [facility], [address], [place], [place] [address]    Culture (A)     >=100,000 COLONIES/mL ESCHERICHIA COLI Confirmed Extended Spectrum Beta-Lactamase Producer (ESBL).  In bloodstream infections from ESBL organisms, carbapenems are preferred over piperacillin/tazobactam. They are shown to have a lower risk of mortality.    Report Status 08/04/2024 FINAL    Organism ID, Bacteria ESCHERICHIA COLI (A)        Susceptibility   Escherichia coli - MIC*    AMPICILLIN >=32 RESISTANT Resistant     CEFAZOLIN (URINE) Value in next row Resistant      >=32 RESISTANTThis is a modified FDA-approved test that has been validated and its performance characteristics determined by the reporting laboratory.  This laboratory is certified under the Clinical Laboratory Improvement Amendments CLIA as qualified to perform high complexity clinical laboratory testing.    CEFEPIME  Value in next row Resistant      >=32 RESISTANTThis is a modified FDA-approved test that has been validated and its performance characteristics determined by the reporting laboratory.  This laboratory is certified under the Clinical Laboratory Improvement Amendments CLIA as qualified to perform high complexity clinical laboratory testing.    ERTAPENEM Value in next row Sensitive      >=32 RESISTANTThis is a modified FDA-approved test that has been validated and its performance characteristics determined by the reporting laboratory.  This laboratory is certified under the Clinical Laboratory Improvement Amendments CLIA as qualified to perform high complexity clinical laboratory testing.    CEFTRIAXONE  Value in next row Resistant      >=32 RESISTANTThis is a modified FDA-approved test that has been validated and its performance characteristics determined by the reporting laboratory.  This laboratory is certified under the Clinical Laboratory Improvement Amendments CLIA as qualified to perform high complexity clinical laboratory testing.    CIPROFLOXACIN   Value in next row Resistant      >=32 RESISTANTThis is a modified FDA-approved test that has been validated and its performance characteristics determined by the reporting laboratory.  This laboratory is certified under the Clinical Laboratory Improvement Amendments CLIA as qualified to perform high complexity clinical laboratory testing.    GENTAMICIN Value in next row Sensitive      >=32 RESISTANTThis is a  modified FDA-approved test that has been validated and its performance characteristics determined by the reporting laboratory.  This laboratory is certified under the Clinical Laboratory Improvement Amendments CLIA as qualified to perform high complexity clinical laboratory testing.    NITROFURANTOIN Value in next row Sensitive      >=32 RESISTANTThis is a modified FDA-approved test that has been validated and its performance characteristics determined by the reporting laboratory.  This laboratory is certified under the Clinical Laboratory Improvement Amendments CLIA as qualified to perform high complexity clinical laboratory testing.    TRIMETH/SULFA Value in next row Resistant      >=32 RESISTANTThis is a modified FDA-approved test that has been validated and its performance characteristics determined by the reporting laboratory.  This laboratory is certified under the Clinical Laboratory Improvement Amendments CLIA as qualified to perform high complexity clinical laboratory testing.    AMPICILLIN/SULBACTAM Value in next row Resistant      >=32 RESISTANTThis is a modified FDA-approved test that has been validated and its performance characteristics determined by the reporting laboratory.  This laboratory is certified under the Clinical Laboratory Improvement Amendments CLIA as qualified to perform high complexity clinical laboratory testing.    PIP/TAZO Value in next row Intermediate      64 INTERMEDIATEThis is a modified FDA-approved test that has been validated and its performance characteristics determined by the reporting laboratory.  This laboratory is certified under the Clinical Laboratory Improvement Amendments CLIA as qualified to perform high complexity clinical laboratory testing.    MEROPENEM  Value in next row Sensitive      64 INTERMEDIATEThis is a modified FDA-approved test that has been validated and its performance characteristics determined by the reporting laboratory.  This laboratory  is certified under the Clinical Laboratory Improvement Amendments CLIA as qualified to perform high complexity clinical laboratory testing.    * >=100,000 COLONIES/mL ESCHERICHIA COLI  Glucose, capillary     Status: Abnormal   Collection Time: 08/02/24  4:37 PM  Result Value Ref Range   Glucose-Capillary 162 (H) 70 - 99 mg/dL    Comment: Glucose reference range applies only to samples taken after fasting for at least 8 hours.   Comment 1 Notify RN    Comment 2 Document in Chart   Glucose, capillary     Status: Abnormal   Collection Time: 08/02/24  7:35 PM  Result Value Ref Range   Glucose-Capillary 214 (H) 70 - 99 mg/dL    Comment: Glucose reference range applies only to samples taken after fasting for at least 8 hours.  CBC with Differential/Platelet     Status: Abnormal   Collection Time: 08/03/24  5:30 AM  Result Value Ref Range   WBC 12.8 (H) 4.0 - 10.5 K/uL   RBC 4.07 3.87 - 5.11 MIL/uL   Hemoglobin 11.7 (L) 12.0 - 15.0 g/dL   HCT 61.2 63.9 - 53.9 %   MCV 95.1 80.0 - 100.0 fL   MCH 28.7 26.0 - 34.0 pg   MCHC 30.2 30.0 - 36.0 g/dL   RDW 84.9 88.4 - 84.4 %   Platelets 208 150 -  400 K/uL   nRBC 0.0 0.0 - 0.2 %   Neutrophils Relative % 80 %   Neutro Abs 10.3 (H) 1.7 - 7.7 K/uL   Lymphocytes Relative 11 %   Lymphs Abs 1.4 0.7 - 4.0 K/uL   Monocytes Relative 6 %   Monocytes Absolute 0.7 0.1 - 1.0 K/uL   Eosinophils Relative 2 %   Eosinophils Absolute 0.2 0.0 - 0.5 K/uL   Basophils Relative 0 %   Basophils Absolute 0.0 0.0 - 0.1 K/uL   Immature Granulocytes 1 %   Abs Immature Granulocytes 0.10 (H) 0.00 - 0.07 K/uL    Comment: Performed at [facility], [address] [place], [place] [address]  Basic metabolic panel with GFR     Status: Abnormal   Collection Time: 08/03/24  5:30 AM  Result Value Ref Range   Sodium 146 (H) 135 - 145 mmol/L   Potassium 3.8 3.5 - 5.1 mmol/L   Chloride 115 (H) 98 - 111 mmol/L   CO2 22 22 - 32 mmol/L   Glucose, Bld 227 (H) 70 - 99 mg/dL     Comment: Glucose reference range applies only to samples taken after fasting for at least 8 hours.   BUN 16 8 - 23 mg/dL   Creatinine, Ser 8.86 (H) 0.44 - 1.00 mg/dL   Calcium  8.8 (L) 8.9 - 10.3 mg/dL   GFR, Estimated 51 (L) >60 mL/min    Comment: (NOTE) Calculated using the CKD-EPI Creatinine Equation (2021)    Anion gap 9 5 - 15    Comment: Performed at [facility], [address] [place], [place] [address]  Glucose, capillary     Status: Abnormal   Collection Time: 08/03/24  7:38 AM  Result Value Ref Range   Glucose-Capillary 214 (H) 70 - 99 mg/dL    Comment: Glucose reference range applies only to samples taken after fasting for at least 8 hours.  MRSA Next Gen by PCR, Nasal     Status: Abnormal   Collection Time: 08/03/24 10:55 AM   Specimen: Nasal Mucosa; Nasal Swab  Result Value Ref Range   MRSA by PCR Next Gen DETECTED (A) NOT DETECTED    Comment: RESULT CALLED TO, READ BACK BY AND VERIFIED WITH: S STONE AT 1921 ON [id number] BY S DALTON (NOTE) The GeneXpert MRSA Assay (FDA approved for NASAL specimens only), is one component of a comprehensive MRSA colonization surveillance program. It is not intended to diagnose MRSA infection nor to guide or monitor treatment for MRSA infections. Test performance is not FDA approved in patients less than 89 years old. Performed at [facility], [address] [place], [place] [address]   Glucose, capillary     Status: Abnormal   Collection Time: 08/03/24 10:58 AM  Result Value Ref Range   Glucose-Capillary 187 (H) 70 - 99 mg/dL    Comment: Glucose reference range applies only to samples taken after fasting for at least 8 hours.  Glucose, capillary     Status: Abnormal   Collection Time: 08/03/24  4:24 PM  Result Value Ref Range   Glucose-Capillary 106 (H) 70 - 99 mg/dL    Comment: Glucose reference range applies only to samples taken after fasting for at least 8 hours.  Glucose, capillary     Status: None   Collection Time: 08/03/24   8:34 PM  Result Value Ref Range   Glucose-Capillary 83 70 - 99 mg/dL    Comment: Glucose reference range applies only to samples taken after  fasting for at least 8 hours.  CBC     Status: Abnormal   Collection Time: 08/04/24  5:12 AM  Result Value Ref Range   WBC 11.7 (H) 4.0 - 10.5 K/uL   RBC 4.06 3.87 - 5.11 MIL/uL   Hemoglobin 11.7 (L) 12.0 - 15.0 g/dL   HCT 62.0 63.9 - 53.9 %   MCV 93.3 80.0 - 100.0 fL   MCH 28.8 26.0 - 34.0 pg   MCHC 30.9 30.0 - 36.0 g/dL   RDW 84.4 88.4 - 84.4 %   Platelets 186 150 - 400 K/uL   nRBC 0.0 0.0 - 0.2 %    Comment: Performed at [facility], [address] [place], [place] [address]  Basic metabolic panel with GFR     Status: Abnormal   Collection Time: 08/04/24  5:12 AM  Result Value Ref Range   Sodium 150 (H) 135 - 145 mmol/L   Potassium 3.3 (L) 3.5 - 5.1 mmol/L   Chloride 117 (H) 98 - 111 mmol/L   CO2 26 22 - 32 mmol/L   Glucose, Bld 56 (L) 70 - 99 mg/dL    Comment: Glucose reference range applies only to samples taken after fasting for at least 8 hours.   BUN 14 8 - 23 mg/dL   Creatinine, Ser 8.87 (H) 0.44 - 1.00 mg/dL   Calcium  9.0 8.9 - 10.3 mg/dL   GFR, Estimated 51 (L) >60 mL/min    Comment: (NOTE) Calculated using the CKD-EPI Creatinine Equation (2021)    Anion gap 7 5 - 15    Comment: Performed at [facility], [address], [place], [place] [address]  Magnesium      Status: None   Collection Time: 08/04/24  5:12 AM  Result Value Ref Range   Magnesium  2.3 1.7 - 2.4 mg/dL    Comment: Performed at [facility], [address] [place], [place] [address]  Glucose, capillary     Status: Abnormal   Collection Time: 08/04/24  7:58 AM  Result Value Ref Range   Glucose-Capillary 53 (L) 70 - 99 mg/dL    Comment: Glucose reference range applies only to samples taken after fasting for at least 8 hours.   Recent Results (from the past 240 hours)  Culture, blood (Routine x 2)     Status: None   Collection Time: 07/28/24  7:54 PM    Specimen: BLOOD  Result Value Ref Range Status   Specimen Description BLOOD BLOOD RIGHT HAND  Final   Special Requests   Final    BOTTLES DRAWN AEROBIC AND ANAEROBIC Blood Culture adequate volume   Culture   Final    NO GROWTH 5 DAYS Performed at [facility], [address] [place], [place] [address]    Report Status 08/02/2024 FINAL  Final  Urine Culture     Status: Abnormal   Collection Time: 07/28/24  8:10 PM   Specimen: Urine, Random  Result Value Ref Range Status   Specimen Description   Final    URINE, RANDOM Performed at [facility], [address] [place], [place] [address]    Special Requests   Final    NONE Reflexed from [id number] Performed at [facility], [address] [place], [place] [address]    Culture MULTIPLE SPECIES PRESENT, SUGGEST RECOLLECTION (A)  Final   Report Status 07/30/2024 FINAL  Final  Culture, blood (Routine x 2)     Status: None   Collection Time: 07/28/24  8:22 PM   Specimen: BLOOD  Result Value Ref Range Status   Specimen Description BLOOD BLOOD LEFT HAND  Final   Special Requests   Final    AEROBIC BOTTLE ONLY Blood Culture results may not be optimal due to an inadequate volume of blood received in culture bottles   Culture   Final    NO GROWTH 5 DAYS Performed at [facility], [address], [place], [place] [address]    Report Status 08/02/2024 FINAL  Final  Urine Culture     Status: Abnormal   Collection Time: 08/02/24  1:18 PM   Specimen: Urine, Random  Result Value Ref Range Status   Specimen Description   Final    URINE, RANDOM Performed at [facility], [address], [place], [place] [address]    Special Requests   Final    NONE Reflexed from [id number] Performed at [facility], [address], [place], [place] [address]    Culture (A)  Final    >=100,000 COLONIES/mL ESCHERICHIA COLI Confirmed Extended Spectrum Beta-Lactamase Producer (ESBL).  In bloodstream infections from ESBL organisms, carbapenems are preferred over  piperacillin/tazobactam. They are shown to have a lower risk of mortality.    Report Status 08/04/2024 FINAL  Final   Organism ID, Bacteria ESCHERICHIA COLI (A)  Final      Susceptibility   Escherichia coli - MIC*    AMPICILLIN >=32 RESISTANT Resistant     CEFAZOLIN (URINE) Value in next row Resistant      >=32 RESISTANTThis is a modified FDA-approved test that has been validated and its performance characteristics determined by the reporting laboratory.  This laboratory is certified under the Clinical Laboratory Improvement Amendments CLIA as qualified to perform high complexity clinical laboratory testing.    CEFEPIME  Value in next row Resistant      >=32 RESISTANTThis is a modified FDA-approved test that has been validated and its performance characteristics determined by the reporting laboratory.  This laboratory is certified under the Clinical Laboratory Improvement Amendments CLIA as qualified to perform high complexity clinical laboratory testing.    ERTAPENEM Value in next row Sensitive      >=32 RESISTANTThis is a modified FDA-approved test that has been validated and its performance characteristics determined by the reporting laboratory.  This laboratory is certified under the Clinical Laboratory Improvement Amendments CLIA as qualified to perform high complexity clinical laboratory testing.    CEFTRIAXONE  Value in next row Resistant      >=32 RESISTANTThis is a modified FDA-approved test that has been validated and its performance characteristics determined by the reporting laboratory.  This laboratory is certified under the Clinical Laboratory Improvement Amendments CLIA as qualified to perform high complexity clinical laboratory testing.    CIPROFLOXACIN  Value in next row Resistant      >=32 RESISTANTThis is a modified FDA-approved test that has been validated and its performance characteristics determined by the reporting laboratory.  This laboratory is certified under the Clinical  Laboratory Improvement Amendments CLIA as qualified to perform high complexity clinical laboratory testing.    GENTAMICIN Value in next row Sensitive      >=32 RESISTANTThis is a modified FDA-approved test that has been validated and its performance characteristics determined by the reporting laboratory.  This laboratory is certified under the Clinical Laboratory Improvement Amendments CLIA as qualified to perform high complexity clinical laboratory testing.    NITROFURANTOIN Value in next row Sensitive      >=32 RESISTANTThis is a modified FDA-approved test that has been validated and its performance characteristics determined  by the reporting laboratory.  This laboratory is certified under the Clinical Laboratory Improvement Amendments CLIA as qualified to perform high complexity clinical laboratory testing.    TRIMETH/SULFA Value in next row Resistant      >=32 RESISTANTThis is a modified FDA-approved test that has been validated and its performance characteristics determined by the reporting laboratory.  This laboratory is certified under the Clinical Laboratory Improvement Amendments CLIA as qualified to perform high complexity clinical laboratory testing.    AMPICILLIN/SULBACTAM Value in next row Resistant      >=32 RESISTANTThis is a modified FDA-approved test that has been validated and its performance characteristics determined by the reporting laboratory.  This laboratory is certified under the Clinical Laboratory Improvement Amendments CLIA as qualified to perform high complexity clinical laboratory testing.    PIP/TAZO Value in next row Intermediate      64 INTERMEDIATEThis is a modified FDA-approved test that has been validated and its performance characteristics determined by the reporting laboratory.  This laboratory is certified under the Clinical Laboratory Improvement Amendments CLIA as qualified to perform high complexity clinical laboratory testing.    MEROPENEM  Value in next row  Sensitive      64 INTERMEDIATEThis is a modified FDA-approved test that has been validated and its performance characteristics determined by the reporting laboratory.  This laboratory is certified under the Clinical Laboratory Improvement Amendments CLIA as qualified to perform high complexity clinical laboratory testing.    * >=100,000 COLONIES/mL ESCHERICHIA COLI  MRSA Next Gen by PCR, Nasal     Status: Abnormal   Collection Time: 08/03/24 10:55 AM   Specimen: Nasal Mucosa; Nasal Swab  Result Value Ref Range Status   MRSA by PCR Next Gen DETECTED (A) NOT DETECTED Final    Comment: RESULT CALLED TO, READ BACK BY AND VERIFIED WITH: S STONE AT 1921 ON [id number] BY S DALTON (NOTE) The GeneXpert MRSA Assay (FDA approved for NASAL specimens only), is one component of a comprehensive MRSA colonization surveillance program. It is not intended to diagnose MRSA infection nor to guide or monitor treatment for MRSA infections. Test performance is not FDA approved in patients less than 31 years old. 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 Due to the significant stone burden the patient would benefit from left nephrostomy tube placement and continued broad spectrum antibiotics Bilateral staghorn calculi: We will observe for now until her infection has cleared. The patient will likely required stage PCNL as an outpatient  Belvie Clara 08/04/2024, 8:27 AM         [1]  Allergies Allergen Reactions   Penicillins Rash and  Swelling   Iodinated Contrast Media Swelling    Per pt: swelling with first contrast xray in 1969. Second contrast xray in the 90s without symptoms:  Patient has been pre-medicated for CT scans in 2016. Needs to have pre-medications before any scans with IV contrast. / TSF 08/30/16

## 2024-08-04 NOTE — Plan of Care (Signed)
   Problem: Education: Goal: Knowledge of General Education information will improve Description Including pain rating scale, medication(s)/side effects and non-pharmacologic comfort measures Outcome: Progressing

## 2024-08-04 NOTE — Progress Notes (Signed)
 SLP Cancellation Note  Patient Details Name: Dawn Ho MRN: [id number] DOB: 16-May-1951   Cancelled treatment:       Reason Eval/Treat Not Completed: Patient at procedure or test/unavailable; pt being bathed when SLP attempted session.   [name],M.S.,CCC-SLP 08/04/2024, 10:25 AM

## 2024-08-04 NOTE — Progress Notes (Addendum)
 Initial Nutrition Assessment  DOCUMENTATION CODES:   Not applicable  INTERVENTION:  Glucerna Shake po TID, each supplement provides 220 kcal and 10 grams of protein Magic cup TID with meals, each supplement provides 290 kcal and 9 grams of protein MVI with minerals daily  NUTRITION DIAGNOSIS:  Inadequate oral intake related to lethargy/confusion as evidenced by meal completion < 50%.  GOAL:  Patient will meet greater than or equal to 90% of their needs  MONITOR:  PO intake, Supplement acceptance, Labs  REASON FOR ASSESSMENT:  Consult Assessment of nutrition requirement/status  ASSESSMENT:  73 yo female admitted with AKI, dehydration. PMH includes fibromyalgia, DM, HTN, HLD, TIA, CVA w/ L hemiparesis, retinopathy, vascular dementia, cognitive communication deficit, dysphagia, A fib.  12/12: CT abd/pelvis showed bilateral staghorn calculi with L emphysematous pyelitis.  Plans for L PCN placement on 12/19 at [facility], then patient will return to [facility].   Patient was not able to answer RD questions. She was lying in bed sleeping and humming and woke up when RD spoke to her, but she was confused. RN reports that patient requires assistance with feeding. She likes the sweets on her trays, but pockets anything else. Nurse gave her a Glucerna shake this morning and patient liked it.  Currently on a dysphagia 2 diet with thin liquids as recommended by SLP on 12/14. Meal intakes: 10-25%  Labs reviewed.  Na 150 K 3.3 CBG: 297 - 294 - 276 - 310 - 379 - 412 - 459 - 190 - 199 - 162 - 214 - 214 - 187 - 106 - 83 - 53 - 76 - 135 CBGs have improved with insulin  adjustment. Now running on the low side with poor intake of meals.   Medications reviewed and include ferrous sulfate , novolog  SSI QID, novolog  3 units TID with meals, levothyroxine , senokot-s. IVF: D5 with KCl 20 mEq/L at 60 ml/h  Patient is at increased nutrition risk d/t 14% weight loss within the past 14 months and current poor PO intake  with hx of dementia.   NUTRITION - FOCUSED PHYSICAL EXAM: Flowsheet Row Most Recent Value  Orbital Region No depletion  Upper Arm Region Unable to assess  Thoracic and Lumbar Region No depletion  Buccal Region No depletion  Temple Region No depletion  Clavicle Bone Region No depletion  Clavicle and Acromion Bone Region Unable to assess  Scapular Bone Region Unable to assess  Dorsal Hand No depletion  Patellar Region Unable to assess  Anterior Thigh Region Unable to assess  Posterior Calf Region Unable to assess  Edema (RD Assessment) Unable to assess  Hair Reviewed  Eyes Unable to assess  Mouth Unable to assess  Skin Reviewed  Nails Reviewed    Diet Order:   Diet Order             Diet NPO time specified Except for: Sips with Meds  Diet effective midnight           DIET DYS 2 Room service appropriate? Yes; Fluid consistency: Thin  Diet effective now                   EDUCATION NEEDS:  Not appropriate for education at this time  Skin:  Skin Assessment: Reviewed RN Assessment  Last BM:  12/16 type 6  Height:  Ht Readings from Last 1 Encounters:  07/29/24 5' (1.524 m)   Weight:  Wt Readings from Last 1 Encounters:  08/01/24 63.4 kg   Ideal Body Weight:  45.5 kg  BMI:  Body mass index is 27.3 kg/m.  Estimated Nutritional Needs:  Kcal:  1400-1600 Protein:  70-80 gm Fluid:  >/= 1.5 L   Suzen HUNT RD, LDN, CNSC Contact via secure chat. If unavailable, use group chat RD Inpatient.

## 2024-08-04 NOTE — Progress Notes (Signed)
 Speech Language Pathology Treatment: Dysphagia  Patient Details Name: Dawn Ho MRN: [id number] DOB: 07-01-1951 Today's Date: 08/04/2024 Time: 8273-8258 SLP Time Calculation (min) (ACUTE ONLY): 15 min  Assessment / Plan / Recommendation Clinical Impression  Pt seen for ongoing dysphagia intervention. She was seen after her evening meal and had consumed almost everything on her tray, but was willing to take a bit more with SLP. She is HOH. She continues to present with disorganized oral phase with prolonged oral transit with finely chopped textures and benefited from puree and/or liquid wash to clear the small remnants of meat in oral cavity. Vocal quality remained strong and clear. Recommend continue diet as ordered (D2 and thin) with aspiration and reflux precautions and PO medication crushed as able in puree (she expectorated earlier and RN will try in ice cream). No further SLP services indicated at this time. SLP will sign off as Pt is on her baseline diet. Reconsult prn.    HPI HPI: Dawn Ho is a 73 y.o. female with medical history significant for CVA with left hemiparesis, type 2 diabetes, hypertension, hyperlipidemia, chronic anxiety/depression, fibromyalgia, bilateral staghorn calculi, who presents to the ER from SNF due to increased fatigue for the past few days.  The patient is minimally verbal.  EMS was activated.  Upon EMS arrival the patient was hyperglycemic with CBG in the 300s.CXR negative for acute process.  ST evaluated 10/29 with recommendations for Dysphagia 2(minced)/thin liquids given min dentition/cognitive impairment.      SLP Plan  All goals met;Discharge SLP treatment due to (comment)        Swallow Evaluation Recommendations   Recommendations: PO diet PO Diet Recommendation: Dysphagia 2 (Finely chopped);Thin liquids (Level 0) Liquid Administration via: Cup;Straw Medication Administration: Crushed with puree Supervision: Staff to assist with  self-feeding;Full assist for feeding;Full supervision/cueing for swallowing strategies Postural changes: Position pt fully upright for meals;Stay upright 30-60 min after meals Oral care recommendations: Oral care BID (2x/day);Staff/trained caregiver to provide oral care     Recommendations                     Oral care BID;Staff/trained caregiver to provide oral care   Frequent or constant Supervision/Assistance Dysphagia, oropharyngeal phase (R13.12)     All goals met;Discharge SLP treatment due to (comment)   Thank you,  Lamar Candy, CCC-SLP [phone]   Kirstie Larsen  08/04/2024, 5:51 PM

## 2024-08-04 NOTE — Progress Notes (Addendum)
 PROGRESS NOTE    Dawn Ho  FMW:[id number] DOB: [birth year] DOA: 07/28/2024 PCP: Celine Erla Bong, MD   Brief Narrative:    Dawn Ho is a 73 y.o. female with medical history significant for CVA with left hemiparesis, type 2 diabetes, hypertension, hyperlipidemia, chronic anxiety/depression, fibromyalgia, bilateral staghorn calculi, who presents to the ER from SNF due to increased fatigue for the past few days.  The patient is minimally verbal.  EMS was activated.  Upon EMS arrival the patient was hyperglycemic with CBG in the 300s.  Patient was admitted with prerenal AKI in the setting of dehydration and was also noted to be hypovolemic and hyponatremic.  She is noted to have UTI with bilateral staghorn calculi and left emphysematous pyelitis.  She was switched to Merrem  for antibiotic treatment given persistent fevers on 12/12.  Case was discussed with urology on 12/15 with recommendation for left-sided nephrostomy tube placement and IR has been consulted with current plan for Plavix  washout and nephrostomy tube placement on 12/19.  Assessment & Plan:   Principal Problem:   AKI (acute kidney injury) Active Problems:   Diabetes mellitus (HCC)   History of TIAs   H/O: CVA (cerebrovascular accident)   Hypernatremia   Dehydration   Hypothyroidism  Assessment and Plan:   AKI, prerenal in the setting of dehydration from poor oral intake-improving Patient from SNF. Creatinine 1.73 on presentation, improving to 1.1, baseline is 0.7.  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 coli UTI   CT abdomen pelvis 12/12 shows bilateral staghorn calculi with left emphysematous pyelitis.  Patient received IV ceftriaxone   for 3 doses +1-day of cefepime .  Will switch to meropenem  based on previous culture.  New UA obtained from 12/12 is positive, will follow cultures - Patient has history of known kidney stones on both sides and has seen urology in the past but looks like lost follow-up Patient denies abdominal pain and is nontender to exam -Will monitor fever curve.  She is afebrile for > 48 hours now -Appreciate IR consultation for nephrostomy tube placement on 12/19 and remain off of Plavix  for now -Continue treatment with Merrem      Labile type 2 diabetes with hyperglycemia and hypoglycemia Presented with serum glucose of 306 Last hemoglobin A1c 8.3 on 06/04/2023 Heart healthy carb modified diet Discontinue Lantus  and maintain on D5 IV fluid Dietitian consultation  Hypernatremia/hypokalemia Replete potassium and remain on D5 IV fluid Monitor a.m. labs   Generalized weakness PT OT evaluation with recommendation for SNF on discharge Fall precautions.   History of prior CVA with hemiparesis Resume home regimen Prior to admission on Plavix  and Lipitor. SLP evaluation pending   Hypothyroidism Resume home levothyroxine .   Chronic anxiety/depression Resume home regimen.    DVT prophylaxis:Lovenox  Code Status: Full Family Communication: Discussed with son on phone 12/15 Disposition Plan:  Status is: Inpatient Remains inpatient appropriate because: Need for IV meds and neo  Consultants:  Urology IR  Procedures:  None  Antimicrobials:  Anti-infectives (From admission, onward)    Start     Dose/Rate Route Frequency Ordered Stop   08/07/24 0800  cefTRIAXone  (ROCEPHIN ) 2 g in sodium chloride  0.9 % 100 mL IVPB        2 g 200 mL/hr over 30 Minutes Intravenous  To Radiology 08/03/24 1349 08/08/24 0800   08/01/24 1000  meropenem  (MERREM ) 1 g in sodium chloride  0.9 % 100 mL IVPB        1 g 200 mL/hr over 30 Minutes Intravenous Every 12 hours 08/01/24 0910     08/01/24 0915  ertapenem  (INVANZ) 1 g in sodium chloride  0.9 % 100 mL IVPB  Status:  Discontinued        1 g 200 mL/hr over 30 Minutes Intravenous Every 24 hours 08/01/24 0829 08/01/24 0910   07/31/24 1130  ceFEPIme  (MAXIPIME ) 2 g in sodium chloride  0.9 % 100 mL IVPB  Status:  Discontinued        2 g 200 mL/hr over 30 Minutes Intravenous Every 12 hours 07/31/24 1055 08/01/24 0829   07/29/24 1400  cefTRIAXone  (ROCEPHIN ) 2 g in sodium chloride  0.9 % 100 mL IVPB  Status:  Discontinued        2 g 200 mL/hr over 30 Minutes Intravenous Every 24 hours 07/29/24 0122 07/31/24 0737   07/28/24 2000  cefTRIAXone  (ROCEPHIN ) 2 g in sodium chloride  0.9 % 100 mL IVPB        2 g 200 mL/hr over 30 Minutes Intravenous  Once 07/28/24 1958 07/28/24 2211       Subjective: Patient seen and evaluated today with noted hypoglycemia overnight that was asymptomatic.  She is hardly eating.  Objective: Vitals:   08/03/24 0544 08/03/24 1318 08/03/24 1940 08/04/24 0457  BP: (!) 163/71 (!) 124/51 133/72 (!) 130/52  Pulse: 64 69 72 62  Resp: 17 16 18 17   Temp: 98.2 F (36.8 C) (!) 97.4 F (36.3 C) 98 F (36.7 C) 98.7 F (37.1 C)  TempSrc:  Oral Axillary   SpO2: 100% 100% 98% 97%  Weight:      Height:        Intake/Output Summary (Last 24 hours) at 08/04/2024 1218 Last data filed at 08/04/2024 1038 Gross per 24 hour  Intake 50 ml  Output 300 ml  Net -250 ml   Filed Weights   07/28/24 1927 07/29/24 0053 08/01/24 0511  Weight: 74 kg 63.4 kg 63.4 kg    Examination:  General exam: Appears calm and comfortable  Respiratory system: Clear to auscultation. Respiratory effort normal. Cardiovascular system: S1 & S2 heard, RRR.  Gastrointestinal system: Abdomen is soft Central nervous system: Alert and awake Extremities: No edema Skin: No significant lesions noted Psychiatry: Flat affect.    Data Reviewed: I have personally reviewed following labs and imaging studies  CBC: Recent Labs  Lab 07/28/24 1954 07/29/24 0509  07/30/24 0632 08/02/24 0632 08/03/24 0530 08/04/24 0512  WBC 9.1 10.8* 9.0 12.4* 12.8* 11.7*  NEUTROABS 6.6  --  5.6  --  10.3*  --   HGB 12.6 11.8* 11.7* 10.9* 11.7* 11.7*  HCT 40.4 38.8 39.9 35.0* 38.7 37.9  MCV 91.6 93.5 95.9 92.1 95.1 93.3  PLT 343 304 263 207 208 186   Basic Metabolic Panel: Recent Labs  Lab 07/29/24 0509 07/30/24 0632 07/31/24 0817 08/01/24 0508 08/01/24 2234 08/02/24 0632 08/03/24 0530 08/04/24 0512  NA 153*   < > 153* 150*  --  148* 146* 150*  K 3.7   < > 3.1* 3.3*  --  3.5 3.8 3.3*  CL 117*   < > 119* 117*  --  117* 115* 117*  CO2 23   < > 24 24  --  24 22 26   GLUCOSE 306*   < > 253* 224* 364* 194*  227* 56*  BUN 33*   < > 20 22  --  17 16 14   CREATININE 1.64*   < > 1.39* 1.35*  --  1.18* 1.13* 1.12*  CALCIUM  9.0   < > 9.1 9.1  --  8.6* 8.8* 9.0  MG 2.4  --   --   --   --   --   --  2.3  PHOS 3.7  --   --   --   --   --   --   --    < > = values in this interval not displayed.   GFR: Estimated Creatinine Clearance: 36.7 mL/min (A) (by C-G formula based on SCr of 1.12 mg/dL (H)). Liver Function Tests: Recent Labs  Lab 07/28/24 1954  AST 13*  ALT 10  ALKPHOS 103  BILITOT 0.3  PROT 7.8  ALBUMIN 3.5   No results for input(s): LIPASE, AMYLASE in the last 168 hours. No results for input(s): AMMONIA in the last 168 hours. Coagulation Profile: Recent Labs  Lab 07/28/24 1954  INR 1.1   Cardiac Enzymes: No results for input(s): CKTOTAL, CKMB, CKMBINDEX, TROPONINI in the last 168 hours. BNP (last 3 results) No results for input(s): PROBNP in the last 8760 hours. HbA1C: No results for input(s): HGBA1C in the last 72 hours. CBG: Recent Labs  Lab 08/03/24 1624 08/03/24 2034 08/04/24 0758 08/04/24 0902 08/04/24 1205  GLUCAP 106* 83 53* 76 135*   Lipid Profile: No results for input(s): CHOL, HDL, LDLCALC, TRIG, CHOLHDL, LDLDIRECT in the last 72 hours. Thyroid  Function Tests: No results for input(s):  TSH, T4TOTAL, FREET4, T3FREE, THYROIDAB in the last 72 hours. Anemia Panel: No results for input(s): VITAMINB12, FOLATE, FERRITIN, TIBC, IRON, RETICCTPCT in the last 72 hours. Sepsis Labs: Recent Labs  Lab 07/28/24 1954  LATICACIDVEN 1.0    Recent Results (from the past 240 hours)  Culture, blood (Routine x 2)     Status: None   Collection Time: 07/28/24  7:54 PM   Specimen: BLOOD  Result Value Ref Range Status   Specimen Description BLOOD BLOOD RIGHT HAND  Final   Special Requests   Final    BOTTLES DRAWN AEROBIC AND ANAEROBIC Blood Culture adequate volume   Culture   Final    NO GROWTH 5 DAYS Performed at [facility], [address], [place], [place] [address]    Report Status 08/02/2024 FINAL  Final  Urine Culture     Status: Abnormal   Collection Time: 07/28/24  8:10 PM   Specimen: Urine, Random  Result Value Ref Range Status   Specimen Description   Final    URINE, RANDOM Performed at [facility], [address], [place], [place] [address]    Special Requests   Final    NONE Reflexed from [id number] Performed at [facility], [address] [place], [place] [address]    Culture MULTIPLE SPECIES PRESENT, SUGGEST RECOLLECTION (A)  Final   Report Status 07/30/2024 FINAL  Final  Culture, blood (Routine x 2)     Status: None   Collection Time: 07/28/24  8:22 PM   Specimen: BLOOD  Result Value Ref Range Status   Specimen Description BLOOD BLOOD LEFT HAND  Final   Special Requests   Final    AEROBIC BOTTLE ONLY Blood Culture results may not be optimal due to an inadequate volume of blood received in culture bottles   Culture   Final    NO GROWTH 5 DAYS Performed at [facility], [address], [place], [place] [address]    Report Status 08/02/2024 FINAL  Final  Urine Culture     Status: Abnormal   Collection Time: 08/02/24  1:18 PM   Specimen: Urine, Random  Result Value Ref Range Status   Specimen Description   Final    URINE, RANDOM Performed  at [facility], [address], [place], [place] [address]    Special Requests   Final    NONE Reflexed from [id number] Performed at [facility], [address] [place], [place] [address]    Culture (A)  Final    >=100,000 COLONIES/mL ESCHERICHIA COLI Confirmed Extended Spectrum Beta-Lactamase Producer (ESBL).  In bloodstream infections from ESBL organisms, carbapenems are preferred over piperacillin/tazobactam. They are shown to have a lower risk of mortality.    Report Status 08/04/2024 FINAL  Final   Organism ID, Bacteria ESCHERICHIA COLI (A)  Final      Susceptibility   Escherichia coli - MIC*    AMPICILLIN >=32 RESISTANT Resistant     CEFAZOLIN (URINE) Value in next row Resistant      >=32 RESISTANTThis is a modified FDA-approved test that has been validated and its performance characteristics determined by the reporting laboratory.  This laboratory is certified under the Clinical Laboratory Improvement Amendments CLIA as qualified to perform high complexity clinical laboratory testing.    CEFEPIME  Value in next row Resistant      >=32 RESISTANTThis is a modified FDA-approved test that has been validated and its performance characteristics determined by the reporting laboratory.  This laboratory is certified under the Clinical Laboratory Improvement Amendments CLIA as qualified to perform high complexity clinical laboratory testing.    ERTAPENEM Value in next row Sensitive      >=32 RESISTANTThis is a modified FDA-approved test that has been validated and its performance characteristics determined by the reporting laboratory.  This laboratory is certified under the Clinical Laboratory Improvement Amendments CLIA as qualified to perform high complexity clinical laboratory testing.    CEFTRIAXONE  Value in next row Resistant      >=32 RESISTANTThis is a modified FDA-approved test that has been validated and its performance characteristics determined by the reporting laboratory.  This laboratory  is certified under the Clinical Laboratory Improvement Amendments CLIA as qualified to perform high complexity clinical laboratory testing.    CIPROFLOXACIN  Value in next row Resistant      >=32 RESISTANTThis is a modified FDA-approved test that has been validated and its performance characteristics determined by the reporting laboratory.  This laboratory is certified under the Clinical Laboratory Improvement Amendments CLIA as qualified to perform high complexity clinical laboratory testing.    GENTAMICIN Value in next row Sensitive      >=32 RESISTANTThis is a modified FDA-approved test that has been validated and its performance characteristics determined by the reporting laboratory.  This laboratory is certified under the Clinical Laboratory Improvement Amendments CLIA as qualified to perform high complexity clinical laboratory testing.    NITROFURANTOIN Value in next row Sensitive      >=32 RESISTANTThis is a modified FDA-approved test that has been validated and its performance characteristics determined by the reporting laboratory.  This laboratory is certified under the Clinical Laboratory Improvement Amendments CLIA as qualified to perform high complexity clinical laboratory testing.    TRIMETH/SULFA Value in next row Resistant      >=32 RESISTANTThis is a modified FDA-approved test that has been validated and its performance characteristics determined by the reporting laboratory.  This laboratory is  certified under the Clinical Laboratory Improvement Amendments CLIA as qualified to perform high complexity clinical laboratory testing.    AMPICILLIN/SULBACTAM Value in next row Resistant      >=32 RESISTANTThis is a modified FDA-approved test that has been validated and its performance characteristics determined by the reporting laboratory.  This laboratory is certified under the Clinical Laboratory Improvement Amendments CLIA as qualified to perform high complexity clinical laboratory testing.     PIP/TAZO Value in next row Intermediate      64 INTERMEDIATEThis is a modified FDA-approved test that has been validated and its performance characteristics determined by the reporting laboratory.  This laboratory is certified under the Clinical Laboratory Improvement Amendments CLIA as qualified to perform high complexity clinical laboratory testing.    MEROPENEM  Value in next row Sensitive      64 INTERMEDIATEThis is a modified FDA-approved test that has been validated and its performance characteristics determined by the reporting laboratory.  This laboratory is certified under the Clinical Laboratory Improvement Amendments CLIA as qualified to perform high complexity clinical laboratory testing.    * >=100,000 COLONIES/mL ESCHERICHIA COLI  MRSA Next Gen by PCR, Nasal     Status: Abnormal   Collection Time: 08/03/24 10:55 AM   Specimen: Nasal Mucosa; Nasal Swab  Result Value Ref Range Status   MRSA by PCR Next Gen DETECTED (A) NOT DETECTED Final    Comment: RESULT CALLED TO, READ BACK BY AND VERIFIED WITH: S STONE AT 1921 ON [id number] BY S DALTON (NOTE) The GeneXpert MRSA Assay (FDA approved for NASAL specimens only), is one component of a comprehensive MRSA colonization surveillance program. It is not intended to diagnose MRSA infection nor to guide or monitor treatment for MRSA infections. Test performance is not FDA approved in patients less than 47 years old. Performed at [facility], [address], [place], [place] [address]          Radiology Studies: No results found.      Scheduled Meds:  atorvastatin   10 mg Oral QPM   enoxaparin  (LOVENOX ) injection  40 mg Subcutaneous Q24H   ferrous sulfate   325 mg Oral BID WC   gabapentin   300 mg Oral QHS   insulin  aspart  0-5 Units Subcutaneous QHS   insulin  aspart  0-9 Units Subcutaneous TID WC   insulin  aspart  3 Units Subcutaneous TID WC   levothyroxine   50 mcg Oral Q0600   LORazepam   0.5 mg Oral BID   senna-docusate  1  tablet Oral BID   sertraline   50 mg Oral Daily   Continuous Infusions:  [START ON 08/07/2024] cefTRIAXone  (ROCEPHIN )  IV     dextrose  5 % with KCl 20 mEq / L 20 mEq (08/04/24 1147)   meropenem  (MERREM ) IV 1 g (08/04/24 1055)     LOS: 6 days    Time spent: 55 minutes    Idella Lamontagne D Maree, DO [facility]  If 7PM-7AM, please contact night-coverage [email or web address] 08/04/2024, 12:18 PM

## 2024-08-05 DIAGNOSIS — E870 Hyperosmolality and hypernatremia: Secondary | ICD-10-CM | POA: Diagnosis not present

## 2024-08-05 DIAGNOSIS — Z8673 Personal history of transient ischemic attack (TIA), and cerebral infarction without residual deficits: Secondary | ICD-10-CM

## 2024-08-05 DIAGNOSIS — E860 Dehydration: Secondary | ICD-10-CM | POA: Diagnosis not present

## 2024-08-05 DIAGNOSIS — N179 Acute kidney failure, unspecified: Secondary | ICD-10-CM | POA: Diagnosis not present

## 2024-08-05 DIAGNOSIS — E039 Hypothyroidism, unspecified: Secondary | ICD-10-CM | POA: Diagnosis not present

## 2024-08-05 LAB — BASIC METABOLIC PANEL WITH GFR
Anion gap: 8 (ref 5–15)
BUN: 15 mg/dL (ref 8–23)
CO2: 24 mmol/L (ref 22–32)
Calcium: 8.6 mg/dL — ABNORMAL LOW (ref 8.9–10.3)
Chloride: 111 mmol/L (ref 98–111)
Creatinine, Ser: 0.89 mg/dL (ref 0.44–1.00)
GFR, Estimated: >60 mL/min (ref >60)
Glucose, Bld: 161 mg/dL — ABNORMAL HIGH (ref 70–99)
Potassium: 3.3 mmol/L — ABNORMAL LOW (ref 3.5–5.1)
Sodium: 144 mmol/L (ref 135–145)

## 2024-08-05 LAB — GLUCOSE, CAPILLARY
Glucose-Capillary: 181 mg/dL — ABNORMAL HIGH (ref 70–99)
Glucose-Capillary: 148 mg/dL — ABNORMAL HIGH (ref 70–99)
Glucose-Capillary: 153 mg/dL — ABNORMAL HIGH (ref 70–99)
Glucose-Capillary: 208 mg/dL — ABNORMAL HIGH (ref 70–99)

## 2024-08-05 LAB — CBC
HCT: 34.4 % — ABNORMAL LOW (ref 36.0–46.0)
Hemoglobin: 10.6 g/dL — ABNORMAL LOW (ref 12.0–15.0)
MCH: 28.3 pg (ref 26.0–34.0)
MCHC: 30.8 g/dL (ref 30.0–36.0)
MCV: 92 fL (ref 80.0–100.0)
Platelets: 173 K/uL (ref 150–400)
RBC: 3.74 MIL/uL — ABNORMAL LOW (ref 3.87–5.11)
RDW: 15.1 % (ref 11.5–15.5)
WBC: 12.6 K/uL — ABNORMAL HIGH (ref 4.0–10.5)
nRBC: 0 % (ref 0.0–0.2)

## 2024-08-05 LAB — MAGNESIUM: Magnesium: 1.8 mg/dL (ref 1.7–2.4)

## 2024-08-05 MED ORDER — SODIUM CHLORIDE 0.9 % IV SOLN: 1.0000 g | INTRAVENOUS | Status: DC

## 2024-08-05 MED ORDER — CHLORHEXIDINE GLUCONATE CLOTH 2 % EX PADS
6.0000 | MEDICATED_PAD | Freq: Every day | CUTANEOUS | Status: DC
  Administered 2024-08-05 – 2024-08-08 (×3): 6 via TOPICAL

## 2024-08-05 MED ORDER — MUPIROCIN 2 % EX OINT
1.0000 | TOPICAL_OINTMENT | Freq: Two times a day (BID) | CUTANEOUS | Status: DC
  Administered 2024-08-05 – 2024-08-08 (×6): 1 via NASAL
  Filled 2024-08-05 (×2): qty 22

## 2024-08-05 NOTE — Hospital Course (Signed)
 73 y.o. female with medical history significant for CVA with left hemiparesis, type 2 diabetes, hypertension, hyperlipidemia, chronic anxiety/depression, fibromyalgia, bilateral staghorn calculi, who presents to the ER from SNF due to increased fatigue for the past few days.  The patient is minimally verbal.  EMS was activated.  Upon EMS arrival the patient was hyperglycemic with CBG in the 300s.  Patient was admitted with prerenal AKI in the setting of dehydration and was also noted to be hypovolemic and hyponatremic.  She is noted to have UTI with bilateral staghorn calculi and left emphysematous pyelitis.  She was switched to Merrem  for antibiotic treatment given persistent fevers on 12/12.  Case was discussed with urology on 12/15 with recommendation for left-sided nephrostomy tube placement and IR has been consulted with current plan for Plavix  washout and nephrostomy tube placement on 12/19.

## 2024-08-05 NOTE — Plan of Care (Signed)

## 2024-08-05 NOTE — Progress Notes (Signed)
 Pharmacy - Brief Note   - Antibiotic prophylaxis prior to nephrostomy tube placement on 12/19 by IR  I contacted IR NP, Warren Dais, on 12/17 to inform her that urine cx has ESBL E coli so ceftriaxone  pre-PCN placement would not be active against this bacteria and that patient is currently getting meropenem  1g q12h at 10a/10p.  Since patient will go to [facility] early Fri (12/19) morning, the new plan is to give the Fri am dose of meropenem  at [facility] in IR prior to procedure.     Tiegan Jambor, PharmD, BCPS, BCIDP Work Cell: [phone] 08/05/2024 11:11 AM

## 2024-08-05 NOTE — Progress Notes (Signed)
 PROGRESS NOTE   Dawn Ho  FMW:[id number] DOB: 1951-02-16 DOA: 07/28/2024 PCP: Celine Erla Bong, MD   Chief Complaint  Patient presents with   not feeling well   Level of care: Med-Surg  Brief Admission History:  73 y.o. female with medical history significant for CVA with left hemiparesis, type 2 diabetes, hypertension, hyperlipidemia, chronic anxiety/depression, fibromyalgia, bilateral staghorn calculi, who presents to the ER from SNF due to increased fatigue for the past few days.  The patient is minimally verbal.  EMS was activated.  Upon EMS arrival the patient was hyperglycemic with CBG in the 300s.  Patient was admitted with prerenal AKI in the setting of dehydration and was also noted to be hypovolemic and hyponatremic.  She is noted to have UTI with bilateral staghorn calculi and left emphysematous pyelitis.  She was switched to Merrem  for antibiotic treatment given persistent fevers on 12/12.  Case was discussed with urology on 12/15 with recommendation for left-sided nephrostomy tube placement and IR has been consulted with current plan for Plavix  washout and nephrostomy tube placement on 12/19.    Assessment and Plan:  AKI, prerenal in the setting of dehydration from poor oral intake-improving Patient from SNF. Creatinine 1.73 on presentation, improving to 1.1, baseline is 0.7.  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 coli UTI   CT abdomen pelvis 12/12 shows bilateral staghorn calculi with left emphysematous pyelitis.  Patient received IV ceftriaxone  for 3 doses +1-day of cefepime .  Will switch to meropenem  based on previous culture.  New UA obtained from 12/12 is positive, will follow cultures -  Patient has history of known kidney stones on both sides and has seen urology in the past but looks like lost follow-up Patient denies abdominal pain and is nontender to exam -Will monitor fever curve.  She is afebrile for > 48 hours now -Appreciate IR consultation for nephrostomy tube placement on 12/19 and remain off of Plavix  for now -Continue treatment with Merrem     Labile type 2 diabetes with hyperglycemia and hypoglycemia Presented with serum glucose of 306 Last hemoglobin A1c 8.3 on 06/04/2023 Heart healthy carb modified diet Discontinue Lantus  and maintain on D5 IV fluid Dietitian consultation   Hypernatremia/hypokalemia Replete potassium further Recheck in AM    Generalized weakness PT OT evaluation with recommendation for SNF on discharge Fall precautions.   History of prior CVA with hemiparesis Resume home regimen Prior to admission on Plavix  and Lipitor. SLP evaluation with recommendation for dys 2 diet medications: crushed wit puree, thin liquids, via cup; straw   Hypothyroidism Resume home levothyroxine .   Chronic anxiety/depression Resume home regimen.   DVT prophylaxis: enoxaparin  Code Status: Full  Family Communication:  Disposition:    Consultants:  IR Procedures:   Antimicrobials:    Subjective: Pt agreeable to procedure to be done on 12/19.  No specific complaints.   Objective: Vitals:   08/04/24 0457 08/04/24 2133 08/05/24 0534 08/05/24 1257  BP: (!) 130/52 (!) 149/71 (!) 123/48 (!) 173/97  Pulse: 62 79 68 67  Resp: 17 20 18 16   Temp: 98.7 F (37.1 C) 99.5 F (37.5 C) 97.8 F (36.6 C) 97.8 F (36.6 C)  TempSrc:  Oral Axillary Axillary  SpO2: 97% 97% 96% 100%  Weight:  Height:        Intake/Output Summary (Last 24 hours) at 08/05/2024 1428 Last data filed at 08/05/2024 0900 Gross per 24 hour  Intake 2276.84 ml  Output 1100 ml  Net 1176.84 ml   Filed Weights   07/28/24 1927 07/29/24 0053 08/01/24 0511  Weight: 74 kg 63.4  kg 63.4 kg   Examination:  General exam: Appears calm and comfortable  Respiratory system: Clear to auscultation. Respiratory effort normal. Cardiovascular system: normal S1 & S2 heard. No JVD, murmurs, rubs, gallops or clicks. No pedal edema. Gastrointestinal system: Abdomen is nondistended, soft and nontender. No organomegaly or masses felt. Normal bowel sounds heard. Central nervous system: Alert and oriented. No focal neurological deficits. Extremities: Symmetric 5 x 5 power. Skin: No rashes, lesions or ulcers. Psychiatry: Judgement and insight appear normal. Mood & affect appropriate.   Data Reviewed: I have personally reviewed following labs and imaging studies  CBC: Recent Labs  Lab 07/30/24 0632 08/02/24 0632 08/03/24 0530 08/04/24 0512 08/05/24 0458  WBC 9.0 12.4* 12.8* 11.7* 12.6*  NEUTROABS 5.6  --  10.3*  --   --   HGB 11.7* 10.9* 11.7* 11.7* 10.6*  HCT 39.9 35.0* 38.7 37.9 34.4*  MCV 95.9 92.1 95.1 93.3 92.0  PLT 263 207 208 186 173    Basic Metabolic Panel: Recent Labs  Lab 08/01/24 0508 08/01/24 2234 08/02/24 0632 08/03/24 0530 08/04/24 0512 08/05/24 0458  NA 150*  --  148* 146* 150* 144  K 3.3*  --  3.5 3.8 3.3* 3.3*  CL 117*  --  117* 115* 117* 111  CO2 24  --  24 22 26 24   GLUCOSE 224* 364* 194* 227* 56* 161*  BUN 22  --  17 16 14 15   CREATININE 1.35*  --  1.18* 1.13* 1.12* 0.89  CALCIUM  9.1  --  8.6* 8.8* 9.0 8.6*  MG  --   --   --   --  2.3 1.8    CBG: Recent Labs  Lab 08/04/24 1205 08/04/24 1651 08/04/24 2122 08/05/24 0726 08/05/24 1112  GLUCAP 135* 254* 296* 181* 148*    Recent Results (from the past 240 hours)  Culture, blood (Routine x 2)     Status: None   Collection Time: 07/28/24  7:54 PM   Specimen: BLOOD  Result Value Ref Range Status   Specimen Description BLOOD BLOOD RIGHT HAND  Final   Special Requests   Final    BOTTLES DRAWN AEROBIC AND ANAEROBIC Blood Culture adequate volume   Culture   Final    NO GROWTH 5  DAYS Performed at [facility], [address], [place], [place] [address]    Report Status 08/02/2024 FINAL  Final  Urine Culture     Status: Abnormal   Collection Time: 07/28/24  8:10 PM   Specimen: Urine, Random  Result Value Ref Range Status   Specimen Description   Final    URINE, RANDOM Performed at [facility], [address] [place], [place] [address]    Special Requests   Final    NONE Reflexed from [id number] Performed at [facility], [address], [place], [place] [address]    Culture MULTIPLE SPECIES PRESENT, SUGGEST RECOLLECTION (A)  Final   Report Status 07/30/2024 FINAL  Final  Culture, blood (Routine x 2)     Status: None   Collection Time: 07/28/24  8:22 PM   Specimen: BLOOD  Result Value Ref Range Status   Specimen Description BLOOD BLOOD LEFT HAND  Final   Special Requests   Final    AEROBIC BOTTLE ONLY Blood Culture results may not be optimal due to an inadequate volume of blood received in culture bottles   Culture   Final    NO GROWTH 5 DAYS Performed at [facility], [address], [place], [place] [address]    Report Status 08/02/2024 FINAL  Final  Urine Culture     Status: Abnormal   Collection Time: 08/02/24  1:18 PM   Specimen: Urine, Random  Result Value Ref Range Status   Specimen Description   Final    URINE, RANDOM Performed at [facility], [address], [place], [place] [address]    Special Requests   Final    NONE Reflexed from [id number] Performed at [facility], [address], [place], [place] [address]    Culture (A)  Final    >=100,000 COLONIES/mL ESCHERICHIA COLI Confirmed Extended Spectrum Beta-Lactamase Producer (ESBL).  In bloodstream infections from ESBL organisms, carbapenems are preferred over piperacillin/tazobactam. They are shown to have a lower risk of mortality.    Report Status 08/04/2024 FINAL  Final   Organism ID, Bacteria ESCHERICHIA COLI (A)  Final      Susceptibility   Escherichia coli - MIC*    AMPICILLIN >=32  RESISTANT Resistant     CEFAZOLIN (URINE) Value in next row Resistant      >=32 RESISTANTThis is a modified FDA-approved test that has been validated and its performance characteristics determined by the reporting laboratory.  This laboratory is certified under the Clinical Laboratory Improvement Amendments CLIA as qualified to perform high complexity clinical laboratory testing.    CEFEPIME  Value in next row Resistant      >=32 RESISTANTThis is a modified FDA-approved test that has been validated and its performance characteristics determined by the reporting laboratory.  This laboratory is certified under the Clinical Laboratory Improvement Amendments CLIA as qualified to perform high complexity clinical laboratory testing.    ERTAPENEM Value in next row Sensitive      >=32 RESISTANTThis is a modified FDA-approved test that has been validated and its performance characteristics determined by the reporting laboratory.  This laboratory is certified under the Clinical Laboratory Improvement Amendments CLIA as qualified to perform high complexity clinical laboratory testing.    CEFTRIAXONE  Value in next row Resistant      >=32 RESISTANTThis is a modified FDA-approved test that has been validated and its performance characteristics determined by the reporting laboratory.  This laboratory is certified under the Clinical Laboratory Improvement Amendments CLIA as qualified to perform high complexity clinical laboratory testing.    CIPROFLOXACIN  Value in next row Resistant      >=32 RESISTANTThis is a modified FDA-approved test that has been validated and its performance characteristics determined by the reporting laboratory.  This laboratory is certified under the Clinical Laboratory Improvement Amendments CLIA as qualified to perform high complexity clinical laboratory testing.    GENTAMICIN Value in next row Sensitive      >=32 RESISTANTThis is a modified FDA-approved test that has been validated and its  performance characteristics determined by the reporting laboratory.  This laboratory is certified under the Clinical Laboratory Improvement Amendments CLIA as qualified to perform high complexity clinical laboratory testing.    NITROFURANTOIN Value in next row Sensitive      >=32 RESISTANTThis is a modified FDA-approved test that has been validated and its performance characteristics determined by the reporting laboratory.  This laboratory is certified under the Clinical Laboratory Improvement  Amendments CLIA as qualified to perform high complexity clinical laboratory testing.    TRIMETH/SULFA Value in next row Resistant      >=32 RESISTANTThis is a modified FDA-approved test that has been validated and its performance characteristics determined by the reporting laboratory.  This laboratory is certified under the Clinical Laboratory Improvement Amendments CLIA as qualified to perform high complexity clinical laboratory testing.    AMPICILLIN/SULBACTAM Value in next row Resistant      >=32 RESISTANTThis is a modified FDA-approved test that has been validated and its performance characteristics determined by the reporting laboratory.  This laboratory is certified under the Clinical Laboratory Improvement Amendments CLIA as qualified to perform high complexity clinical laboratory testing.    PIP/TAZO Value in next row Intermediate      64 INTERMEDIATEThis is a modified FDA-approved test that has been validated and its performance characteristics determined by the reporting laboratory.  This laboratory is certified under the Clinical Laboratory Improvement Amendments CLIA as qualified to perform high complexity clinical laboratory testing.    MEROPENEM  Value in next row Sensitive      64 INTERMEDIATEThis is a modified FDA-approved test that has been validated and its performance characteristics determined by the reporting laboratory.  This laboratory is certified under the Clinical Laboratory Improvement  Amendments CLIA as qualified to perform high complexity clinical laboratory testing.    * >=100,000 COLONIES/mL ESCHERICHIA COLI  MRSA Next Gen by PCR, Nasal     Status: Abnormal   Collection Time: 08/03/24 10:55 AM   Specimen: Nasal Mucosa; Nasal Swab  Result Value Ref Range Status   MRSA by PCR Next Gen DETECTED (A) NOT DETECTED Final    Comment: RESULT CALLED TO, READ BACK BY AND VERIFIED WITH: S STONE AT 1921 ON [id number] BY S DALTON (NOTE) The GeneXpert MRSA Assay (FDA approved for NASAL specimens only), is one component of a comprehensive MRSA colonization surveillance program. It is not intended to diagnose MRSA infection nor to guide or monitor treatment for MRSA infections. Test performance is not FDA approved in patients less than 13 years old. Performed at [facility], [address] [place], [place] [address]      Radiology Studies: No results found.  Scheduled Meds:  atorvastatin   10 mg Oral QPM   Chlorhexidine  Gluconate Cloth  6 each Topical Daily   enoxaparin  (LOVENOX ) injection  40 mg Subcutaneous Q24H   feeding supplement (GLUCERNA SHAKE)  237 mL Oral TID BM   ferrous sulfate   325 mg Oral BID WC   gabapentin   300 mg Oral QHS   insulin  aspart  0-5 Units Subcutaneous QHS   insulin  aspart  0-9 Units Subcutaneous TID WC   insulin  aspart  3 Units Subcutaneous TID WC   levothyroxine   50 mcg Oral Q0600   LORazepam   0.5 mg Oral BID   multivitamin with minerals  1 tablet Oral Daily   mupirocin  ointment  1 Application Nasal BID   senna-docusate  1 tablet Oral BID   sertraline   50 mg Oral Daily   Continuous Infusions:  meropenem  (MERREM ) IV 1 g (08/05/24 1118)   [START ON 08/07/2024] meropenem  (MERREM ) IV       LOS: 7 days   Time spent: 56 mins  Ajooni Karam Vicci, MD How to contact the [facility] Attending or Consulting provider 7A - 7P or covering provider during after hours 7P -7A, for this patient?  Check the care team in [facility] and look for a) attending/consulting [facility]  provider listed and b) the  [facility] team listed Log into [email or web address] to find provider on call.  Locate the [facility] provider you are looking for under [facility] and page to a number that you can be directly reached. If you still have difficulty reaching the provider, please page the [facility] (Director on Call) for the Hospitalists listed on amion for assistance.  08/05/2024, 2:28 PM  ]

## 2024-08-05 NOTE — Plan of Care (Signed)

## 2024-08-06 DIAGNOSIS — N179 Acute kidney failure, unspecified: Secondary | ICD-10-CM | POA: Diagnosis not present

## 2024-08-06 DIAGNOSIS — E039 Hypothyroidism, unspecified: Secondary | ICD-10-CM | POA: Diagnosis not present

## 2024-08-06 DIAGNOSIS — Z8673 Personal history of transient ischemic attack (TIA), and cerebral infarction without residual deficits: Secondary | ICD-10-CM | POA: Diagnosis not present

## 2024-08-06 DIAGNOSIS — E860 Dehydration: Secondary | ICD-10-CM | POA: Diagnosis not present

## 2024-08-06 LAB — GLUCOSE, CAPILLARY
Glucose-Capillary: 158 mg/dL — ABNORMAL HIGH (ref 70–99)
Glucose-Capillary: 113 mg/dL — ABNORMAL HIGH (ref 70–99)
Glucose-Capillary: 313 mg/dL — ABNORMAL HIGH (ref 70–99)
Glucose-Capillary: 284 mg/dL — ABNORMAL HIGH (ref 70–99)

## 2024-08-06 NOTE — Plan of Care (Signed)
°  Problem: Education: Goal: Knowledge of General Education information will improve Description: Including pain rating scale, medication(s)/side effects and non-pharmacologic comfort measures Outcome: Progressing   Problem: Clinical Measurements: Goal: Respiratory complications will improve Outcome: Progressing   Problem: Activity: Goal: Risk for activity intolerance will decrease Outcome: Progressing   Problem: Pain Managment: Goal: General experience of comfort will improve and/or be controlled Outcome: Progressing   Problem: Safety: Goal: Ability to remain free from injury will improve Outcome: Progressing   Problem: Skin Integrity: Goal: Risk for impaired skin integrity will decrease Outcome: Progressing   Problem: Fluid Volume: Goal: Ability to maintain a balanced intake and output will improve Outcome: Progressing

## 2024-08-06 NOTE — Progress Notes (Signed)
 PROGRESS NOTE   Dawn Ho  FMW:[id number] DOB: 02-19-1951 DOA: 07/28/2024 PCP: Celine Erla Bong, MD   Chief Complaint  Patient presents with   not feeling well   Level of care: Med-Surg  Brief Admission History:  73 y.o. female with medical history significant for CVA with left hemiparesis, type 2 diabetes, hypertension, hyperlipidemia, chronic anxiety/depression, fibromyalgia, bilateral staghorn calculi, who presents to the ER from SNF due to increased fatigue for the past few days.  The patient is minimally verbal.  EMS was activated.  Upon EMS arrival the patient was hyperglycemic with CBG in the 300s.  Patient was admitted with prerenal AKI in the setting of dehydration and was also noted to be hypovolemic and hyponatremic.  She is noted to have UTI with bilateral staghorn calculi and left emphysematous pyelitis.  She was switched to Merrem  for antibiotic treatment given persistent fevers on 12/12.  Case was discussed with urology on 12/15 with recommendation for left-sided nephrostomy tube placement and IR has been consulted with current plan for Plavix  washout and nephrostomy tube placement on 12/19.    Assessment and Plan:  AKI, prerenal in the setting of dehydration from poor oral intake-improving Patient from SNF. Creatinine 1.73 on presentation, improving to 1.1, baseline is 0.7.  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 coli UTI   CT abdomen pelvis 12/12 shows bilateral staghorn calculi with left emphysematous pyelitis.  Patient received IV ceftriaxone  for 3 doses +1-day of cefepime .  Will switch to meropenem  based on previous culture.  New UA obtained from 12/12 is positive, will follow cultures -  Patient has history of known kidney stones on both sides and has seen urology in the past but looks like lost follow-up Patient denies abdominal pain and is nontender to exam -Will monitor fever curve.  She is afebrile for > 48 hours now -Appreciate IR consultation for nephrostomy tube placement on 12/19 and remain off of Plavix  for now -Continue treatment with Merrem     Labile type 2 diabetes with hyperglycemia and hypoglycemia Presented with serum glucose of 306 Last hemoglobin A1c 8.3 on 06/04/2023 Heart healthy carb modified diet Discontinue Lantus  and maintain on D5 IV fluid Dietitian consultation  CBG (last 3)  Recent Labs    08/05/24 2037 08/06/24 0722 08/06/24 1058  GLUCAP 208* 158* 113*    Hypernatremia/hypokalemia Replete potassium further Recheck in AM    Generalized weakness PT OT evaluation with recommendation for SNF on discharge Fall precautions.   History of prior CVA with hemiparesis Resume home regimen Prior to admission on Plavix  and Lipitor. SLP evaluation with recommendation for dys 2 diet medications: crushed wit puree, thin liquids, via cup; straw   Hypothyroidism Resumed home levothyroxine .   Chronic anxiety/depression Resumed home regimen.   DVT prophylaxis: enoxaparin  Code Status: Full  Family Communication:  Disposition: LTC/SNF   Consultants:  IR Procedures:   Antimicrobials:    Subjective: Denies any specific complaints.    Objective: Vitals:   08/05/24 0534 08/05/24 1257 08/05/24 2025 08/06/24 0519  BP: (!) 123/48 (!) 173/97 (!) 146/71 (!) 155/64  Pulse: 68 67 72 (!) 58  Resp: 18 16 18 16   Temp: 97.8 F (36.6 C) 97.8 F (36.6 C) 97.9 F (36.6 C) (!) 97.5 F (36.4 C)  TempSrc:  Axillary Axillary  Oral  SpO2: 96% 100% 99% 100%  Weight:      Height:        Intake/Output Summary (Last 24 hours) at 08/06/2024 1243 Last data filed at 08/06/2024 0500 Gross per 24 hour  Intake 1020 ml  Output 800 ml  Net 220 ml   Filed  Weights   07/28/24 1927 07/29/24 0053 08/01/24 0511  Weight: 74 kg 63.4 kg 63.4 kg   Examination:  General exam: Appears calm and comfortable  Respiratory system: Clear to auscultation. Respiratory effort normal. Cardiovascular system: normal S1 & S2 heard. No JVD, murmurs, rubs, gallops or clicks. No pedal edema. Gastrointestinal system: Abdomen is nondistended, soft and nontender. No organomegaly or masses felt. Normal bowel sounds heard. Central nervous system: Alert and oriented. No focal neurological deficits. Extremities: Symmetric 5 x 5 power. Skin: No rashes, lesions or ulcers. Psychiatry: Judgement and insight appear normal. Mood & affect appropriate.   Data Reviewed: I have personally reviewed following labs and imaging studies  CBC: Recent Labs  Lab 08/02/24 0632 08/03/24 0530 08/04/24 0512 08/05/24 0458  WBC 12.4* 12.8* 11.7* 12.6*  NEUTROABS  --  10.3*  --   --   HGB 10.9* 11.7* 11.7* 10.6*  HCT 35.0* 38.7 37.9 34.4*  MCV 92.1 95.1 93.3 92.0  PLT 207 208 186 173    Basic Metabolic Panel: Recent Labs  Lab 08/01/24 0508 08/01/24 2234 08/02/24 0632 08/03/24 0530 08/04/24 0512 08/05/24 0458  NA 150*  --  148* 146* 150* 144  K 3.3*  --  3.5 3.8 3.3* 3.3*  CL 117*  --  117* 115* 117* 111  CO2 24  --  24 22 26 24   GLUCOSE 224* 364* 194* 227* 56* 161*  BUN 22  --  17 16 14 15   CREATININE 1.35*  --  1.18* 1.13* 1.12* 0.89  CALCIUM  9.1  --  8.6* 8.8* 9.0 8.6*  MG  --   --   --   --  2.3 1.8    CBG: Recent Labs  Lab 08/05/24 1112 08/05/24 1634 08/05/24 2037 08/06/24 0722 08/06/24 1058  GLUCAP 148* 153* 208* 158* 113*    Recent Results (from the past 240 hours)  Culture, blood (Routine x 2)     Status: None   Collection Time: 07/28/24  7:54 PM   Specimen: BLOOD  Result Value Ref Range Status   Specimen Description BLOOD BLOOD RIGHT HAND  Final   Special Requests   Final    BOTTLES DRAWN AEROBIC AND ANAEROBIC Blood Culture adequate volume    Culture   Final    NO GROWTH 5 DAYS Performed at [facility], [address] [place], [place] [address]    Report Status 08/02/2024 FINAL  Final  Urine Culture     Status: Abnormal   Collection Time: 07/28/24  8:10 PM   Specimen: Urine, Random  Result Value Ref Range Status   Specimen Description   Final    URINE, RANDOM Performed at [facility], [address], [place], [place] [address]    Special Requests   Final    NONE Reflexed from [id number] Performed at [facility], [address], [place], [place] [address]    Culture MULTIPLE SPECIES PRESENT, SUGGEST RECOLLECTION (A)  Final   Report Status 07/30/2024 FINAL  Final  Culture, blood (Routine x 2)     Status: None   Collection Time: 07/28/24  8:22 PM   Specimen: BLOOD  Result Value Ref Range  Status   Specimen Description BLOOD BLOOD LEFT HAND  Final   Special Requests   Final    AEROBIC BOTTLE ONLY Blood Culture results may not be optimal due to an inadequate volume of blood received in culture bottles   Culture   Final    NO GROWTH 5 DAYS Performed at [facility], [address], [place], [place] [address]    Report Status 08/02/2024 FINAL  Final  Urine Culture     Status: Abnormal   Collection Time: 08/02/24  1:18 PM   Specimen: Urine, Random  Result Value Ref Range Status   Specimen Description   Final    URINE, RANDOM Performed at [facility], [address], [place], [place] [address]    Special Requests   Final    NONE Reflexed from [id number] Performed at [facility], [address] [place], [place] [address]    Culture (A)  Final    >=100,000 COLONIES/mL ESCHERICHIA COLI Confirmed Extended Spectrum Beta-Lactamase Producer (ESBL).  In bloodstream infections from ESBL organisms, carbapenems are preferred over piperacillin/tazobactam. They are shown to have a lower risk of mortality.    Report Status 08/04/2024 FINAL  Final   Organism ID, Bacteria ESCHERICHIA COLI (A)  Final      Susceptibility   Escherichia coli  - MIC*    AMPICILLIN >=32 RESISTANT Resistant     CEFAZOLIN (URINE) Value in next row Resistant      >=32 RESISTANTThis is a modified FDA-approved test that has been validated and its performance characteristics determined by the reporting laboratory.  This laboratory is certified under the Clinical Laboratory Improvement Amendments CLIA as qualified to perform high complexity clinical laboratory testing.    CEFEPIME  Value in next row Resistant      >=32 RESISTANTThis is a modified FDA-approved test that has been validated and its performance characteristics determined by the reporting laboratory.  This laboratory is certified under the Clinical Laboratory Improvement Amendments CLIA as qualified to perform high complexity clinical laboratory testing.    ERTAPENEM Value in next row Sensitive      >=32 RESISTANTThis is a modified FDA-approved test that has been validated and its performance characteristics determined by the reporting laboratory.  This laboratory is certified under the Clinical Laboratory Improvement Amendments CLIA as qualified to perform high complexity clinical laboratory testing.    CEFTRIAXONE  Value in next row Resistant      >=32 RESISTANTThis is a modified FDA-approved test that has been validated and its performance characteristics determined by the reporting laboratory.  This laboratory is certified under the Clinical Laboratory Improvement Amendments CLIA as qualified to perform high complexity clinical laboratory testing.    CIPROFLOXACIN  Value in next row Resistant      >=32 RESISTANTThis is a modified FDA-approved test that has been validated and its performance characteristics determined by the reporting laboratory.  This laboratory is certified under the Clinical Laboratory Improvement Amendments CLIA as qualified to perform high complexity clinical laboratory testing.    GENTAMICIN Value in next row Sensitive      >=32 RESISTANTThis is a modified FDA-approved test that has  been validated and its performance characteristics determined by the reporting laboratory.  This laboratory is certified under the Clinical Laboratory Improvement Amendments CLIA as qualified to perform high complexity clinical laboratory testing.    NITROFURANTOIN Value in next row Sensitive      >=32 RESISTANTThis is a modified FDA-approved test that has been validated and its performance characteristics determined by the reporting laboratory.  This laboratory is certified under the Clinical Laboratory Improvement Amendments CLIA as qualified to perform high complexity clinical laboratory testing.    TRIMETH/SULFA Value in next row Resistant      >=32 RESISTANTThis is a modified FDA-approved test that has been validated and its performance characteristics determined by the reporting laboratory.  This laboratory is certified under the Clinical Laboratory Improvement Amendments CLIA as qualified to perform high complexity clinical laboratory testing.    AMPICILLIN/SULBACTAM Value in next row Resistant      >=32 RESISTANTThis is a modified FDA-approved test that has been validated and its performance characteristics determined by the reporting laboratory.  This laboratory is certified under the Clinical Laboratory Improvement Amendments CLIA as qualified to perform high complexity clinical laboratory testing.    PIP/TAZO Value in next row Intermediate      64 INTERMEDIATEThis is a modified FDA-approved test that has been validated and its performance characteristics determined by the reporting laboratory.  This laboratory is certified under the Clinical Laboratory Improvement Amendments CLIA as qualified to perform high complexity clinical laboratory testing.    MEROPENEM  Value in next row Sensitive      64 INTERMEDIATEThis is a modified FDA-approved test that has been validated and its performance characteristics determined by the reporting laboratory.  This laboratory is certified under the Clinical  Laboratory Improvement Amendments CLIA as qualified to perform high complexity clinical laboratory testing.    * >=100,000 COLONIES/mL ESCHERICHIA COLI  MRSA Next Gen by PCR, Nasal     Status: Abnormal   Collection Time: 08/03/24 10:55 AM   Specimen: Nasal Mucosa; Nasal Swab  Result Value Ref Range Status   MRSA by PCR Next Gen DETECTED (A) NOT DETECTED Final    Comment: RESULT CALLED TO, READ BACK BY AND VERIFIED WITH: S STONE AT 1921 ON [id number] BY S DALTON (NOTE) The GeneXpert MRSA Assay (FDA approved for NASAL specimens only), is one component of a comprehensive MRSA colonization surveillance program. It is not intended to diagnose MRSA infection nor to guide or monitor treatment for MRSA infections. Test performance is not FDA approved in patients less than 27 years old. Performed at [facility], [address], [place], [place] [address]      Radiology Studies: No results found.  Scheduled Meds:  atorvastatin   10 mg Oral QPM   Chlorhexidine  Gluconate Cloth  6 each Topical Daily   enoxaparin  (LOVENOX ) injection  40 mg Subcutaneous Q24H   feeding supplement (GLUCERNA SHAKE)  237 mL Oral TID BM   ferrous sulfate   325 mg Oral BID WC   gabapentin   300 mg Oral QHS   insulin  aspart  0-5 Units Subcutaneous QHS   insulin  aspart  0-9 Units Subcutaneous TID WC   insulin  aspart  3 Units Subcutaneous TID WC   levothyroxine   50 mcg Oral Q0600   LORazepam   0.5 mg Oral BID   multivitamin with minerals  1 tablet Oral Daily   mupirocin  ointment  1 Application Nasal BID   senna-docusate  1 tablet Oral BID   sertraline   50 mg Oral Daily   Continuous Infusions:  meropenem  (MERREM ) IV 1 g (08/06/24 0842)   [START ON 08/07/2024] meropenem  (MERREM ) IV       LOS: 8 days   Time spent: 48 mins  Sharonne Ricketts Vicci, MD How to contact the [facility] Attending or Consulting provider 7A - 7P or covering provider during after hours 7P -7A, for this patient?  Check the care team in [facility] and look for  a)  attending/consulting TRH provider listed and b) the TRH team listed Log into [email or web address] to find provider on call.  Locate the TRH provider you are looking for under [facility] and page to a number that you can be directly reached. If you still have difficulty reaching the provider, please page the [facility] (Director on Call) for the Hospitalists listed on amion for assistance.  08/06/2024, 12:43 PM  ]

## 2024-08-06 NOTE — Progress Notes (Signed)
 Called to set up transport for tomorrow 12/19 to [facility] - per [facility]link there is no transport from [place] [facility] around 0700-0730. [facility] to call IR to reschedule appointment and call me back to notify of transport time.

## 2024-08-06 NOTE — Plan of Care (Signed)

## 2024-08-07 ENCOUNTER — Inpatient Hospital Stay (HOSPITAL_COMMUNITY)

## 2024-08-07 ENCOUNTER — Other Ambulatory Visit: Payer: Self-pay

## 2024-08-07 ENCOUNTER — Ambulatory Visit (HOSPITAL_COMMUNITY)

## 2024-08-07 DIAGNOSIS — N200 Calculus of kidney: Secondary | ICD-10-CM | POA: Insufficient documentation

## 2024-08-07 DIAGNOSIS — N179 Acute kidney failure, unspecified: Secondary | ICD-10-CM | POA: Diagnosis not present

## 2024-08-07 DIAGNOSIS — E860 Dehydration: Secondary | ICD-10-CM | POA: Diagnosis not present

## 2024-08-07 DIAGNOSIS — N3000 Acute cystitis without hematuria: Secondary | ICD-10-CM | POA: Diagnosis not present

## 2024-08-07 DIAGNOSIS — Z1612 Extended spectrum beta lactamase (ESBL) resistance: Secondary | ICD-10-CM | POA: Diagnosis not present

## 2024-08-07 DIAGNOSIS — E870 Hyperosmolality and hypernatremia: Secondary | ICD-10-CM | POA: Diagnosis not present

## 2024-08-07 DIAGNOSIS — E039 Hypothyroidism, unspecified: Secondary | ICD-10-CM | POA: Diagnosis not present

## 2024-08-07 DIAGNOSIS — R8271 Bacteriuria: Secondary | ICD-10-CM | POA: Insufficient documentation

## 2024-08-07 DIAGNOSIS — Z8673 Personal history of transient ischemic attack (TIA), and cerebral infarction without residual deficits: Secondary | ICD-10-CM | POA: Diagnosis not present

## 2024-08-07 DIAGNOSIS — R6520 Severe sepsis without septic shock: Secondary | ICD-10-CM | POA: Diagnosis not present

## 2024-08-07 DIAGNOSIS — N12 Tubulo-interstitial nephritis, not specified as acute or chronic: Secondary | ICD-10-CM | POA: Diagnosis not present

## 2024-08-07 HISTORY — PX: IR NEPHROSTOMY PLACEMENT LEFT: IMG6063

## 2024-08-07 LAB — CBC
HCT: 33.1 % — ABNORMAL LOW (ref 36.0–46.0)
Hemoglobin: 10.2 g/dL — ABNORMAL LOW (ref 12.0–15.0)
MCH: 28.3 pg (ref 26.0–34.0)
MCHC: 30.8 g/dL (ref 30.0–36.0)
MCV: 91.9 fL (ref 80.0–100.0)
Platelets: 199 K/uL (ref 150–400)
RBC: 3.6 MIL/uL — ABNORMAL LOW (ref 3.87–5.11)
RDW: 15.3 % (ref 11.5–15.5)
WBC: 10.3 K/uL (ref 4.0–10.5)
nRBC: 0 % (ref 0.0–0.2)

## 2024-08-07 LAB — GLUCOSE, CAPILLARY
Glucose-Capillary: 399 mg/dL — ABNORMAL HIGH (ref 70–99)
Glucose-Capillary: 310 mg/dL — ABNORMAL HIGH (ref 70–99)
Glucose-Capillary: 255 mg/dL — ABNORMAL HIGH (ref 70–99)

## 2024-08-07 LAB — BASIC METABOLIC PANEL WITH GFR
Anion gap: 6 (ref 5–15)
BUN: 16 mg/dL (ref 8–23)
CO2: 29 mmol/L (ref 22–32)
Calcium: 8.7 mg/dL — ABNORMAL LOW (ref 8.9–10.3)
Chloride: 110 mmol/L (ref 98–111)
Creatinine, Ser: 1 mg/dL (ref 0.44–1.00)
GFR, Estimated: 59 mL/min — ABNORMAL LOW
Glucose, Bld: 183 mg/dL — ABNORMAL HIGH (ref 70–99)
Potassium: 3.7 mmol/L (ref 3.5–5.1)
Sodium: 144 mmol/L (ref 135–145)

## 2024-08-07 LAB — PROTIME-INR
INR: 1 (ref 0.8–1.2)
Prothrombin Time: 14.3 s (ref 11.4–15.2)

## 2024-08-07 MED ORDER — METHYLPREDNISOLONE SODIUM SUCC 40 MG IJ SOLR
40.0000 mg | INTRAMUSCULAR | Status: AC
  Administered 2024-08-07: 40 mg via INTRAVENOUS
  Filled 2024-08-07 (×3): qty 1

## 2024-08-07 MED ORDER — MIDAZOLAM HCL 2 MG/2ML IJ SOLN
INTRAMUSCULAR | Status: AC
  Filled 2024-08-07: qty 2

## 2024-08-07 MED ORDER — NITROGLYCERIN 0.4 MG SL SUBL: 0.4000 mg | SUBLINGUAL_TABLET | SUBLINGUAL | Status: DC | PRN

## 2024-08-07 MED ORDER — FENTANYL CITRATE (PF) 100 MCG/2ML IJ SOLN
INTRAMUSCULAR | Status: AC | PRN
  Administered 2024-08-07: 25 ug via INTRAVENOUS

## 2024-08-07 MED ORDER — DIPHENHYDRAMINE HCL 50 MG/ML IJ SOLN
INTRAMUSCULAR | Status: AC
  Filled 2024-08-07: qty 1

## 2024-08-07 MED ORDER — SODIUM CHLORIDE 0.9 % IV SOLN
INTRAVENOUS | Status: AC
  Filled 2024-08-07: qty 20

## 2024-08-07 MED ORDER — SODIUM CHLORIDE 0.9% FLUSH: 10.0000 mL | INTRAVENOUS | Status: DC | PRN

## 2024-08-07 MED ORDER — FENTANYL CITRATE (PF) 100 MCG/2ML IJ SOLN
INTRAMUSCULAR | Status: AC
  Filled 2024-08-07: qty 2

## 2024-08-07 MED ORDER — SODIUM CHLORIDE 0.9% FLUSH
10.0000 mL | Freq: Two times a day (BID) | INTRAVENOUS | Status: DC
  Administered 2024-08-07 – 2024-08-08 (×2): 10 mL

## 2024-08-07 MED ORDER — SODIUM CHLORIDE 0.9 % IV SOLN
1.0000 g | Freq: Every day | INTRAVENOUS | Status: DC
  Administered 2024-08-07 – 2024-08-08 (×2): 1 g via INTRAVENOUS
  Filled 2024-08-07 (×4): qty 1000

## 2024-08-07 MED ORDER — INSULIN GLARGINE 100 UNIT/ML ~~LOC~~ SOLN
10.0000 [IU] | Freq: Every day | SUBCUTANEOUS | Status: DC
  Administered 2024-08-07 – 2024-08-08 (×2): 10 [IU] via SUBCUTANEOUS
  Filled 2024-08-07 (×3): qty 0.1

## 2024-08-07 MED ORDER — METHYLPREDNISOLONE SODIUM SUCC 125 MG IJ SOLR
INTRAMUSCULAR | Status: AC
  Filled 2024-08-07: qty 2

## 2024-08-07 MED ORDER — MIDAZOLAM HCL (PF) 2 MG/2ML IJ SOLN
INTRAMUSCULAR | Status: AC | PRN
  Administered 2024-08-07: 1 mg via INTRAVENOUS

## 2024-08-07 MED ORDER — DIPHENHYDRAMINE HCL 50 MG/ML IJ SOLN
50.0000 mg | Freq: Once | INTRAMUSCULAR | Status: AC
  Administered 2024-08-07: 50 mg via INTRAVENOUS

## 2024-08-07 MED ORDER — SODIUM CHLORIDE 0.9 % IV SOLN: INTRAVENOUS | Status: DC | PRN

## 2024-08-07 MED ORDER — LIDOCAINE-EPINEPHRINE 1 %-1:100000 IJ SOLN
INTRAMUSCULAR | Status: AC
  Filled 2024-08-07: qty 20

## 2024-08-07 MED ORDER — VANCOMYCIN HCL IN DEXTROSE 1-5 GM/200ML-% IV SOLN
INTRAVENOUS | Status: AC | PRN
  Administered 2024-08-07: 1000 mg via INTRAVENOUS

## 2024-08-07 MED ORDER — SODIUM CHLORIDE 0.9% FLUSH
5.0000 mL | Freq: Three times a day (TID) | INTRAVENOUS | Status: DC
  Administered 2024-08-07 – 2024-08-08 (×3): 5 mL

## 2024-08-07 MED ORDER — VANCOMYCIN HCL IN DEXTROSE 1-5 GM/200ML-% IV SOLN
INTRAVENOUS | Status: AC
  Filled 2024-08-07: qty 200

## 2024-08-07 MED ORDER — IOHEXOL 300 MG/ML  SOLN
50.0000 mL | Freq: Once | INTRAMUSCULAR | Status: AC | PRN
  Administered 2024-08-07: 15 mL

## 2024-08-07 MED ORDER — LIDOCAINE-EPINEPHRINE 1 %-1:100000 IJ SOLN
20.0000 mL | Freq: Once | INTRAMUSCULAR | Status: AC
  Administered 2024-08-07: 10 mL via INTRADERMAL
  Filled 2024-08-07: qty 20

## 2024-08-07 NOTE — Progress Notes (Signed)
 PHARMACY CONSULT NOTE FOR:  OUTPATIENT  PARENTERAL ANTIBIOTIC THERAPY (OPAT)  Indication: Complicated UTI with ESBL E coli  Regimen: Ertapenem  1gm IV q24h End date: 08/29/2024  -Labs - Once weekly:  CBC/D and CMP -Please leave PICC in place until doctor has seen patient or been notified -Fax weekly labs to [phone]  IV antibiotic discharge orders are pended. To discharging provider:  please sign these orders via discharge navigator,  Select New Orders & click on the button choice - Manage This Unsigned Work.     Thank you for allowing pharmacy to be a part of this patient's care.  Micaela Stith, PharmD, BCPS, BCIDP Work Cell: [phone] 08/07/2024 12:58 PM

## 2024-08-07 NOTE — Plan of Care (Signed)
" °  Problem: Education: Goal: Knowledge of General Education information will improve Description: Including pain rating scale, medication(s)/side effects and non-pharmacologic comfort measures Outcome: Progressing   Problem: Clinical Measurements: Goal: Ability to maintain clinical measurements within normal limits will improve Outcome: Progressing   Problem: Activity: Goal: Risk for activity intolerance will decrease Outcome: Progressing   Problem: Pain Managment: Goal: General experience of comfort will improve and/or be controlled Outcome: Progressing   Problem: Safety: Goal: Ability to remain free from injury will improve Outcome: Progressing   Problem: Skin Integrity: Goal: Risk for impaired skin integrity will decrease Outcome: Progressing   Problem: Metabolic: Goal: Ability to maintain appropriate glucose levels will improve Outcome: Progressing   Problem: Nutritional: Goal: Maintenance of adequate nutrition will improve Outcome: Progressing   "

## 2024-08-07 NOTE — Consult Note (Signed)
 "       [facility]  Reason for Consult:sepsis, pyelonephritis Referring Provider: Vicci Pen    Patient Active Problem List   Diagnosis Date Noted   Dehydration 06/16/2023   Hypothyroidism 06/16/2023   Fall 06/15/2023   Sepsis (HCC) 06/15/2023   Acute metabolic encephalopathy 06/04/2023   Uncontrolled type 2 diabetes mellitus with hyperglycemia, with long-term current use of insulin  (HCC) 06/04/2023   Hematuria 06/04/2023   Hypernatremia 06/04/2023   AKI (acute kidney injury) 06/04/2023   Nephrolithiasis 04/18/2022   Closed right hip fracture, initial encounter (HCC) 09/09/2020   Hypomagnesemia 11/12/2017   Aspiration into airway    Acute respiratory distress    Lung crackles    Ileus (HCC)    Abdominal distension    Leukocytosis    Diarrhea    UTI (urinary tract infection) 10/31/2017   Hypokalemia 10/31/2017   Cryptogenic stroke (HCC) 01/12/2015   Spastic hemiparesis (HCC) 01/12/2015   HLD (hyperlipidemia)    H/O: CVA (cerebrovascular accident)    Accelerated hypertension    Acute cystitis without hematuria    Tachycardia    Stroke (HCC) 11/17/2014   Left-sided weakness 11/17/2014   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 female with cva, dm2, kidney stone admitted for a few days of malaise, elevated nursing home check cbg's level, found to have sepsis in setting of left sided emphysematous pyelitis  12/9 admission afebrile Presumed uti, started on ceftriaxone  Bcx negative 12/9 Urology involved  Other complication include aki which is  improving   12/12 febrile and rising wbc Ct 12/12 showed large staghorn bilaterally and left side pyelitis 12/19 a left side neprhostomu tube placed -- waiting due to plavix  use No urine cx done till 12/14; Urine culture esbl ecoli and abx transitioned from cefepime --> ceftriaxone  to meropenem  by 12/13  Review of Systems: ROS All other ros negative     atorvastatin   10 mg Oral QPM   Chlorhexidine  Gluconate Cloth  6 each Topical Daily   enoxaparin  (LOVENOX ) injection  40 mg Subcutaneous Q24H   feeding supplement (GLUCERNA SHAKE)  237 mL Oral TID BM   ferrous sulfate   325 mg Oral BID WC   gabapentin   300 mg Oral QHS   insulin  aspart  0-5 Units Subcutaneous QHS   insulin  aspart  0-9 Units Subcutaneous TID WC   insulin  aspart  3 Units Subcutaneous TID WC   levothyroxine   50 mcg Oral Q0600   lidocaine -EPINEPHrine   20 mL Intradermal Once   LORazepam   0.5 mg Oral BID   multivitamin with minerals  1 tablet Oral Daily   mupirocin  ointment  1 Application Nasal BID   senna-docusate  1 tablet Oral BID   sertraline   50 mg Oral Daily    Past Medical History:  Diagnosis Date   Cataract, left eye    Cerebral infarction (HCC)    Chronic atrial fibrillation (HCC)    Cognitive communication deficit    CVA (cerebral infarction)    Depression with anxiety    Diabetes mellitus    Dyslipidemia    Dysphagia  Encephalopathy    Fibromyalgia    Hypertension    Retinopathy    Stroke (HCC)    L. hemiparesis   TIA (transient ischemic attack)    Vascular dementia (HCC)     Social History[1]  Family History  Problem Relation Age of Onset   Heart disease Father    Hypertension Sister    Colon cancer Neg Hx    Colon polyps Neg Hx     Allergies[2]  OBJECTIVE: Vitals:   08/06/24 0519 08/06/24 1311 08/06/24 1931 08/07/24 0440  BP: (!) 155/64 (!) 135/54 (!) 129/95 (!) 143/69  Pulse: (!) 58 66 70 67  Resp: 16 20 14 14   Temp: (!) 97.5 F (36.4 C)  98.5 F (36.9 C) 97.6 F (36.4 C)   TempSrc: Oral  Oral Oral  SpO2: 100% 100% 99% 96%  Weight:      Height:       Body mass index is 27.3 kg/m.   Physical Exam Reviewed  Lab: Lab Results  Component Value Date   WBC 10.3 08/07/2024   HGB 10.2 (L) 08/07/2024   HCT 33.1 (L) 08/07/2024   MCV 91.9 08/07/2024   PLT 199 08/07/2024   Last metabolic panel Lab Results  Component Value Date   GLUCOSE 183 (H) 08/07/2024   NA 144 08/07/2024   K 3.7 08/07/2024   CL 110 08/07/2024   CO2 29 08/07/2024   BUN 16 08/07/2024   CREATININE 1.00 08/07/2024   GFRNONAA 59 (L) 08/07/2024   CALCIUM  8.7 (L) 08/07/2024   PHOS 3.7 07/29/2024   PROT 7.8 07/28/2024   ALBUMIN 3.5 07/28/2024   BILITOT 0.3 07/28/2024   ALKPHOS 103 07/28/2024   AST 13 (L) 07/28/2024   ALT 10 07/28/2024   ANIONGAP 6 08/07/2024    Microbiology:  Serology:  Imaging: Reviewed  07/31/24 ct chest abd pelv 1. Large staghorn calculi in both kidneys, largely filling the renal calices and renal pelves bilaterally. There is associated gas in the left intrarenal collecting system. Imaging features are most suggestive of left-sided emphysematous pyelitis. No definite parenchymal gas at this time to suggest emphysematous pyonephrosis. 2. Asymmetric ground-glass opacity in the right lower lobe is probably atelectasis although component of pneumonia is not excluded. 3. Large stool volume in the rectum. Correlation for signs/symptoms of fecal impaction recommended. 4. Aortic Atherosclerosis     Assessment/plan: Problem List Items Addressed This Visit       Genitourinary   Acute cystitis without hematuria - Primary   * (Principal) AKI (acute kidney injury)    Abx: 12/13-c meropenem    12/12-13 cefepime   12/9-12 ceftriaxone   73 y.o. female with cva, dm2, kidney stone admitted for a few days of malaise, elevated nursing home check cbg's level, found to have sepsis in setting of left sided emphysematous pyelitis   Severe sepsis without  shock Pyelonephritis with emphysematous changes Bilateral staghorn calculi  12/12 ct showed left pyelitis with emphysematous changes and bilateral staghorn  12/9 bcx negative 12/14 ucx ecoli esbl (R bactrim/cipro )   Urology involved and planned staged stone extraction   -no oral option, can discharge on ertapenem; opat below -duration abx at least 2 weeks pending clinical improvement including imaging resolution of emphysematous changes -will plan 3 weeks for now and repeat ct to be ordered during id clinic f/u -picc placement -continue contact isolation precaution -id clinic f/u arranged -will sign off -discussed with dr Vicci   OPAT Orders Discharge antibiotics to be given via PICC line Discharge antibiotics: Ertapenem  1 gram iv daily  Duration: 3 weeks from 12/19 End Date: 08/29/24  [facility] Care Per Protocol:  Home health RN for IV administration and teaching; PICC line care and labs.    Labs weekly while on IV antibiotics: _x_ CBC with differential __ BMP _x_ CMP __ CRP __ ESR __ Vancomycin trough __ CK  __ Please pull PIC at completion of IV antibiotics _x_ Please leave PIC in place until doctor has seen patient or been notified  Fax weekly labs to [phone]  Clinic Follow Up Appt: 1/7 @ 930  @  [facility] [address], [place], [place] [address] Phone: [phone]    I spent more than 20 minute reviewing data/chart, and coordinating care, providing diagnostics/treatment plan with treatment team   Constance ONEIDA Passer, MD [facility] 08/07/2024, 9:15 AM     [1]  Social History Tobacco Use   Smoking status: Never   Smokeless tobacco: Never  Vaping Use   Vaping status: Unknown  Substance Use Topics   Alcohol use: No   Drug use: No  [2]  Allergies Allergen Reactions   Penicillins Rash and Swelling   Iodinated Contrast Media Swelling    Per pt: swelling with first contrast  xray in 1969. Second contrast xray in the 90s without symptoms:  Patient has been pre-medicated for CT scans in 2016. Needs to have pre-medications before any scans with IV contrast. / TSF 08/30/16   "

## 2024-08-07 NOTE — Progress Notes (Signed)
 " PROGRESS NOTE   Dawn Ho  FMW:[id number] DOB: 1950-09-22 DOA: 07/28/2024 PCP: Celine Erla Bong, MD   Chief Complaint  Patient presents with   not feeling well   Level of care: Med-Surg  Brief Admission History:  73 y.o. female with medical history significant for CVA with left hemiparesis, type 2 diabetes, hypertension, hyperlipidemia, chronic anxiety/depression, fibromyalgia, bilateral staghorn calculi, who presents to the ER from SNF due to increased fatigue for the past few days.  The patient is minimally verbal.  EMS was activated.  Upon EMS arrival the patient was hyperglycemic with CBG in the 300s.  Patient was admitted with prerenal AKI in the setting of dehydration and was also noted to be hypovolemic and hyponatremic.  She is noted to have UTI with bilateral staghorn calculi and left emphysematous pyelitis.  She was switched to Merrem  for antibiotic treatment given persistent fevers on 12/12.  Case was discussed with urology on 12/15 with recommendation for left-sided nephrostomy tube placement and IR has been consulted with current plan for Plavix  washout and nephrostomy tube placement on 12/19.    Assessment and Plan:  AKI, prerenal in the setting of dehydration from poor oral intake-improving Patient from SNF. Creatinine 1.73 on presentation, improving to 1.1, baseline is 0.7.  IV fluid is below CT shows bilateral staghorn calculi with no hydronephrosis-appreciate urology evaluation with plans for staged PCNL as an outpatient Appreciate urology recommendations for nephrostomy tube placement per IR as noted below If stable, plan to discharge back to LTC tomorrow   Bilateral staghorn calculi with left emphysematous pyelitis with ESBL E. coli UTI   CT abdomen pelvis 12/12 shows bilateral staghorn calculi with left emphysematous pyelitis.  Patient received IV ceftriaxone  for 3 doses +1-day of cefepime .  Switched to meropenem  based on culture.   - Patient has history of  known kidney stones on both sides and has seen urology in the past but looks like lost follow-up - Patient denies abdominal pain and is nontender to exam - Will monitor fever curve.  She is afebrile for > 48 hours now - Appreciate IR consultation for nephrostomy tube placement on 12/19 and remain off of Plavix  for now - Continue treatment with Merrem  per Dr. Overton with ID recommendations and OPAT orders     Labile type 2 diabetes with hyperglycemia and hypoglycemia Presented with serum glucose of 306 Last hemoglobin A1c 8.3 on 06/04/2023 Heart healthy carb modified diet She is now back on lantus  10 units once daily Hyperglycemia from steroids given this morning prior to procedure  Dietitian consultation appreciated   CBG (last 3)  Recent Labs    08/06/24 1621 08/06/24 1928 08/07/24 0724  GLUCAP 313* 284* 399*   Hypernatremia/hypokalemia Replete potassium further   Generalized weakness PT OT evaluation with recommendation for SNF on discharge Fall precautions. Return to LTC tomorrow    History of prior CVA with hemiparesis Resume home regimen Prior to admission on Plavix  and Lipitor. SLP evaluation with recommendation for dys 2 diet medications: crushed wit puree, thin liquids, via cup; straw   Hypothyroidism Resumed home levothyroxine .   Chronic anxiety/depression Resumed home regimen.   DVT prophylaxis: enoxaparin  Code Status: Full  Family Communication:  Disposition: LTC/SNF   Consultants:  IR Procedures:   Antimicrobials:    Subjective: Pt seen just prior to transfer to [facility] for planned procedure.      Objective: Vitals:   08/07/24 1315 08/07/24 1320 08/07/24 1330 08/07/24 1350  BP: (!) 146/66 135/60 (!) 142/61 ROLLEN)  145/65  Pulse: 70 68 75 79  Resp: 19 17 13 16   Temp:      TempSrc:      SpO2: 97% 96% 93% 95%  Weight:      Height:        Intake/Output Summary (Last 24 hours) at 08/07/2024 1415 Last data filed at 08/07/2024 0026 Gross per 24 hour   Intake 340 ml  Output 675 ml  Net -335 ml   Filed Weights   07/28/24 1927 07/29/24 0053 08/01/24 0511  Weight: 74 kg 63.4 kg 63.4 kg   Examination:  General exam: Appears calm and comfortable  Respiratory system: Clear to auscultation. Respiratory effort normal. Cardiovascular system: normal S1 & S2 heard. No JVD, murmurs, rubs, gallops or clicks. No pedal edema. Gastrointestinal system: Abdomen is nondistended, soft and nontender. No organomegaly or masses felt. Normal bowel sounds heard. Central nervous system: Alert and oriented. No focal neurological deficits. Extremities: Symmetric 5 x 5 power. Skin: No rashes, lesions or ulcers. Psychiatry: Judgement and insight appear normal. Mood & affect appropriate.   Data Reviewed: I have personally reviewed following labs and imaging studies  CBC: Recent Labs  Lab 08/02/24 0632 08/03/24 0530 08/04/24 0512 08/05/24 0458 08/07/24 0510  WBC 12.4* 12.8* 11.7* 12.6* 10.3  NEUTROABS  --  10.3*  --   --   --   HGB 10.9* 11.7* 11.7* 10.6* 10.2*  HCT 35.0* 38.7 37.9 34.4* 33.1*  MCV 92.1 95.1 93.3 92.0 91.9  PLT 207 208 186 173 199    Basic Metabolic Panel: Recent Labs  Lab 08/02/24 0632 08/03/24 0530 08/04/24 0512 08/05/24 0458 08/07/24 0510  NA 148* 146* 150* 144 144  K 3.5 3.8 3.3* 3.3* 3.7  CL 117* 115* 117* 111 110  CO2 24 22 26 24 29   GLUCOSE 194* 227* 56* 161* 183*  BUN 17 16 14 15 16   CREATININE 1.18* 1.13* 1.12* 0.89 1.00  CALCIUM  8.6* 8.8* 9.0 8.6* 8.7*  MG  --   --  2.3 1.8  --     CBG: Recent Labs  Lab 08/06/24 0722 08/06/24 1058 08/06/24 1621 08/06/24 1928 08/07/24 0724  GLUCAP 158* 113* 313* 284* 399*    Recent Results (from the past 240 hours)  Culture, blood (Routine x 2)     Status: None   Collection Time: 07/28/24  7:54 PM   Specimen: BLOOD  Result Value Ref Range Status   Specimen Description BLOOD BLOOD RIGHT HAND  Final   Special Requests   Final    BOTTLES DRAWN AEROBIC AND ANAEROBIC  Blood Culture adequate volume   Culture   Final    NO GROWTH 5 DAYS Performed at [facility], [address] [place], [place] [address]    Report Status 08/02/2024 FINAL  Final  Urine Culture     Status: Abnormal   Collection Time: 07/28/24  8:10 PM   Specimen: Urine, Random  Result Value Ref Range Status   Specimen Description   Final    URINE, RANDOM Performed at [facility], [address], [place], [place] [address]    Special Requests   Final    NONE Reflexed from [id number] Performed at [facility], [address] [place], [place] [address]    Culture MULTIPLE SPECIES PRESENT, SUGGEST RECOLLECTION (A)  Final   Report Status 07/30/2024 FINAL  Final  Culture, blood (Routine x 2)     Status: None   Collection Time: 07/28/24  8:22 PM   Specimen: BLOOD  Result Value Ref Range Status   Specimen Description BLOOD BLOOD LEFT HAND  Final   Special Requests   Final    AEROBIC BOTTLE ONLY Blood Culture results may not be optimal due to an inadequate volume of blood received in culture bottles   Culture   Final    NO GROWTH 5 DAYS Performed at [facility], [address], [place], [place] [address]    Report Status 08/02/2024 FINAL  Final  Urine Culture     Status: Abnormal   Collection Time: 08/02/24  1:18 PM   Specimen: Urine, Random  Result Value Ref Range Status   Specimen Description   Final    URINE, RANDOM Performed at [facility], [address], [place], [place] [address]    Special Requests   Final    NONE Reflexed from [id number] Performed at [facility], [address], [place], [place] [address]    Culture (A)  Final    >=100,000 COLONIES/mL ESCHERICHIA COLI Confirmed Extended Spectrum Beta-Lactamase Producer (ESBL).  In bloodstream infections from ESBL organisms, carbapenems are preferred over piperacillin/tazobactam. They are shown to have a lower risk of mortality.    Report Status 08/04/2024 FINAL  Final   Organism ID, Bacteria ESCHERICHIA COLI (A)  Final       Susceptibility   Escherichia coli - MIC*    AMPICILLIN >=32 RESISTANT Resistant     CEFAZOLIN (URINE) Value in next row Resistant      >=32 RESISTANTThis is a modified FDA-approved test that has been validated and its performance characteristics determined by the reporting laboratory.  This laboratory is certified under the Clinical Laboratory Improvement Amendments CLIA as qualified to perform high complexity clinical laboratory testing.    CEFEPIME  Value in next row Resistant      >=32 RESISTANTThis is a modified FDA-approved test that has been validated and its performance characteristics determined by the reporting laboratory.  This laboratory is certified under the Clinical Laboratory Improvement Amendments CLIA as qualified to perform high complexity clinical laboratory testing.    ERTAPENEM Value in next row Sensitive      >=32 RESISTANTThis is a modified FDA-approved test that has been validated and its performance characteristics determined by the reporting laboratory.  This laboratory is certified under the Clinical Laboratory Improvement Amendments CLIA as qualified to perform high complexity clinical laboratory testing.    CEFTRIAXONE  Value in next row Resistant      >=32 RESISTANTThis is a modified FDA-approved test that has been validated and its performance characteristics determined by the reporting laboratory.  This laboratory is certified under the Clinical Laboratory Improvement Amendments CLIA as qualified to perform high complexity clinical laboratory testing.    CIPROFLOXACIN  Value in next row Resistant      >=32 RESISTANTThis is a modified FDA-approved test that has been validated and its performance characteristics determined by the reporting laboratory.  This laboratory is certified under the Clinical Laboratory Improvement Amendments CLIA as qualified to perform high complexity clinical laboratory testing.    GENTAMICIN Value in next row Sensitive      >=32 RESISTANTThis is a  modified FDA-approved test that has been validated and its performance characteristics determined by the reporting laboratory.  This laboratory is certified under the Clinical Laboratory Improvement Amendments CLIA as qualified to perform high complexity clinical laboratory testing.    NITROFURANTOIN Value in next row Sensitive      >=32 RESISTANTThis is a modified FDA-approved test that has been validated and its performance characteristics determined  by the reporting laboratory.  This laboratory is certified under the Clinical Laboratory Improvement Amendments CLIA as qualified to perform high complexity clinical laboratory testing.    TRIMETH/SULFA Value in next row Resistant      >=32 RESISTANTThis is a modified FDA-approved test that has been validated and its performance characteristics determined by the reporting laboratory.  This laboratory is certified under the Clinical Laboratory Improvement Amendments CLIA as qualified to perform high complexity clinical laboratory testing.    AMPICILLIN/SULBACTAM Value in next row Resistant      >=32 RESISTANTThis is a modified FDA-approved test that has been validated and its performance characteristics determined by the reporting laboratory.  This laboratory is certified under the Clinical Laboratory Improvement Amendments CLIA as qualified to perform high complexity clinical laboratory testing.    PIP/TAZO Value in next row Intermediate      64 INTERMEDIATEThis is a modified FDA-approved test that has been validated and its performance characteristics determined by the reporting laboratory.  This laboratory is certified under the Clinical Laboratory Improvement Amendments CLIA as qualified to perform high complexity clinical laboratory testing.    MEROPENEM  Value in next row Sensitive      64 INTERMEDIATEThis is a modified FDA-approved test that has been validated and its performance characteristics determined by the reporting laboratory.  This laboratory  is certified under the Clinical Laboratory Improvement Amendments CLIA as qualified to perform high complexity clinical laboratory testing.    * >=100,000 COLONIES/mL ESCHERICHIA COLI  MRSA Next Gen by PCR, Nasal     Status: Abnormal   Collection Time: 08/03/24 10:55 AM   Specimen: Nasal Mucosa; Nasal Swab  Result Value Ref Range Status   MRSA by PCR Next Gen DETECTED (A) NOT DETECTED Final    Comment: RESULT CALLED TO, READ BACK BY AND VERIFIED WITH: S STONE AT 1921 ON [id number] BY S DALTON (NOTE) The GeneXpert MRSA Assay (FDA approved for NASAL specimens only), is one component of a comprehensive MRSA colonization surveillance program. It is not intended to diagnose MRSA infection nor to guide or monitor treatment for MRSA infections. Test performance is not FDA approved in patients less than 70 years old. Performed at [facility], [address], [place], [place] [address]      Radiology Studies: US  EKG SITE RITE Result Date: 08/07/2024 If Site Rite image not attached, placement could not be confirmed due to current cardiac rhythm.   Scheduled Meds:  atorvastatin   10 mg Oral QPM   Chlorhexidine  Gluconate Cloth  6 each Topical Daily   enoxaparin  (LOVENOX ) injection  40 mg Subcutaneous Q24H   feeding supplement (GLUCERNA SHAKE)  237 mL Oral TID BM   ferrous sulfate   325 mg Oral BID WC   gabapentin   300 mg Oral QHS   insulin  aspart  0-5 Units Subcutaneous QHS   insulin  aspart  0-9 Units Subcutaneous TID WC   insulin  aspart  3 Units Subcutaneous TID WC   insulin  glargine  10 Units Subcutaneous Daily   levothyroxine   50 mcg Oral Q0600   LORazepam   0.5 mg Oral BID   multivitamin with minerals  1 tablet Oral Daily   mupirocin  ointment  1 Application Nasal BID   senna-docusate  1 tablet Oral BID   sertraline   50 mg Oral Daily   Continuous Infusions:  meropenem  (MERREM ) IV Stopped (08/06/24 2210)   meropenem  (MERREM ) IV       LOS: 9 days   Time spent: 55 mins  Dawn Lindseth  Vicci, MD How  to contact the [facility] Attending or Consulting provider 7A - 7P or covering provider during after hours 7P -7A, for this patient?  Check the care team in [facility] and look for a) attending/consulting [facility] provider listed and b) the [facility] team listed Log into [email or web address] to find provider on call.  Locate the [facility] provider you are looking for under [facility] and page to a number that you can be directly reached. If you still have difficulty reaching the provider, please page the [facility] (Director on Call) for the Hospitalists listed on amion for assistance.  08/07/2024, 2:15 PM  ]  "

## 2024-08-07 NOTE — Procedures (Signed)
 Interventional Radiology Procedure Note  Procedure: US  AND FLUORO LEFT 10FR PCN    Complications: None  Estimated Blood Loss:  MIN  Findings: FULL REPORT IN PACS     EMERSON FREDERIC SPECKING, MD

## 2024-08-07 NOTE — Care Management Important Message (Signed)
 Important Message  Patient Details  Name: Dawn Ho MRN: [id number] Date of Birth: 1950/10/22   Important Message Given:  Yes - Medicare IM     Tamarick Kovalcik L Rhian Funari 08/07/2024, 2:47 PM

## 2024-08-07 NOTE — Progress Notes (Addendum)
 Returned from [facility] this afternoon from nephrostomy drain insertion. Dressing to back dry and intact. The PICC was placed in RUA and xray confirmed placement then invanz  started.  Drain  emptied of 120 mls.  Patient ate very little for nurse tech

## 2024-08-07 NOTE — Inpatient Diabetes Management (Addendum)
 Inpatient Diabetes Program Recommendations  AACE/ADA: New Consensus Statement on Inpatient Glycemic Control   Target Ranges:  Prepandial:   less than 140 mg/dL      Peak postprandial:   less than 180 mg/dL (1-2 hours)      Critically ill patients:  140 - 180 mg/dL   Lab Results  Component Value Date   GLUCAP 399 (H) 08/07/2024   HGBA1C 8.3 (H) 07/28/2024    Latest Reference Range & Units 08/06/24 07:22 08/06/24 10:58 08/06/24 16:21 08/06/24 19:28 08/07/24 07:24  Glucose-Capillary 70 - 99 mg/dL 841 (H) 886 (H) 686 (H) 284 (H) 399 (H)   Review of Glycemic Control  Diabetes history: DM2  Outpatient Diabetes medications:  Lantus  10 units BID  Humalog 0-12 units TID   Current orders for Inpatient glycemic control:  Novolog  0-9 units TID + 0-5 at bedtime  Novolog  3 units TID with meals   Inpatient Diabetes Program Recommendations:   Noted:  - patient was given Solu-medrol  40mg  x 1 at 0954 - CBG 399mg /dL this morning  - Patient receiving Lantus  15 units BID on 08/03/24 and hypoglycemic the next morning   Please consider starting Lantus  10 units daily.   Thanks,  Lavanda Search, RN, MSN, [facility]  Inpatient Diabetes Coordinator  Pager [phone] (8a-5p)

## 2024-08-07 NOTE — Progress Notes (Signed)
 Peripherally Inserted Central Catheter Placement  The IV Nurse has discussed with the patient and/or persons authorized to consent for the patient, the purpose of this procedure and the potential benefits and risks involved with this procedure.  The benefits include less needle sticks, lab draws from the catheter, and the patient may be discharged home with the catheter. Risks include, but not limited to, infection, bleeding, blood clot (thrombus formation), and puncture of an artery; nerve damage and irregular heartbeat and possibility to perform a PICC exchange if needed/ordered by physician.  Alternatives to this procedure were also discussed.  Bard Power PICC patient education guide, fact sheet on infection prevention and patient information card has been provided to patient /or left at bedside.  Pt confused. Son, [name] Ned gave consent via phone with 2 RN verification.   PICC Placement Documentation  PICC Single Lumen 08/07/24 Right Brachial 37 cm 1 cm (Active)  Indication for Insertion or Continuance of Line Prolonged intravenous therapies 08/07/24 1641  Exposed Catheter (cm) 1 cm 08/07/24 1641  Site Assessment Clean, Dry, Intact 08/07/24 1641  Line Status Flushed;Saline locked;Blood return noted 08/07/24 1641  Dressing Type Transparent 08/07/24 1641  Dressing Status Antimicrobial disc/dressing in place 08/07/24 1641  Line Care Cap changed;Connections checked and tightened 08/07/24 1641  Dressing Change Due 08/14/24 08/07/24 1641       Bari CHRISTELLA Bohr 08/07/2024, 4:42 PM

## 2024-08-08 DIAGNOSIS — N179 Acute kidney failure, unspecified: Secondary | ICD-10-CM | POA: Diagnosis not present

## 2024-08-08 DIAGNOSIS — N12 Tubulo-interstitial nephritis, not specified as acute or chronic: Secondary | ICD-10-CM | POA: Diagnosis not present

## 2024-08-08 DIAGNOSIS — E860 Dehydration: Secondary | ICD-10-CM | POA: Diagnosis not present

## 2024-08-08 LAB — GLUCOSE, CAPILLARY
Glucose-Capillary: 110 mg/dL — ABNORMAL HIGH (ref 70–99)
Glucose-Capillary: 99 mg/dL (ref 70–99)
Glucose-Capillary: 164 mg/dL — ABNORMAL HIGH (ref 70–99)
Glucose-Capillary: 136 mg/dL — ABNORMAL HIGH (ref 70–99)

## 2024-08-08 MED ORDER — GLUCERNA SHAKE PO LIQD: 237.0000 mL | Freq: Three times a day (TID) | ORAL | Status: AC

## 2024-08-08 MED ORDER — LORAZEPAM 0.5 MG PO TABS: 0.5000 mg | ORAL_TABLET | Freq: Two times a day (BID) | ORAL | 0 refills | Status: AC

## 2024-08-08 MED ORDER — ERTAPENEM IV (FOR PTA / DISCHARGE USE ONLY): 1.0000 g | INTRAVENOUS | 0 refills | Status: AC

## 2024-08-08 MED ORDER — CLOPIDOGREL BISULFATE 75 MG PO TABS: 75.0000 mg | ORAL_TABLET | Freq: Every day | ORAL | Status: AC

## 2024-08-08 MED ORDER — INSULIN GLARGINE 100 UNIT/ML ~~LOC~~ SOLN: 10.0000 [IU] | Freq: Every day | SUBCUTANEOUS | Status: AC

## 2024-08-08 NOTE — Discharge Summary (Signed)
 Physician Discharge Summary  Dawn Ho FMW:[id number] DOB: [birth year] DOA: 07/28/2024  PCP: Celine Erla Bong, MD  Admit date: 07/28/2024 Discharge date: 08/08/2024  Admitted From: Disposition:   Recommendations for Outpatient Follow-up:  Follow up with PCP in 1 weeks Follow up with [facility] [place] in 2 weeks Follow up with IR drain clinic as scheduled  Please obtain BMP/CBC in one week Please monitor blood sugar at least 4 times per day Urostomy care per protocol  Pt will discharge with PICC line and IV antibiotics:  ertapenem  IVPB Commonly known as: INVANZ  Inject 1 g into the vein daily for 20 days. Indication:  Complicated UTI with ESBL E coli  Last Day of Therapy:  08/29/2024 Labs - Once weekly:  CBC/D and CMP Method of administration: Mini-Bag Plus / Gravity Method of administration may be changed at the discretion of facility and its pharmacy Please leave PICC in place until doctor has seen patient or been notified Fax weekly labs to [phone]  Home Health: SNF  Discharge Condition: STABLE   CODE STATUS: FULL DIET:  carb modified (Diabetic)  Brief Hospitalization Summary: Please see all hospital notes, images, labs for full details of the hospitalization. Admission provider HPI:  73 y.o. female with medical history significant for CVA with left hemiparesis, type 2 diabetes, hypertension, hyperlipidemia, chronic anxiety/depression, fibromyalgia, bilateral staghorn calculi, who presents to the ER from SNF due to increased fatigue for the past few days.  The patient is minimally verbal.  EMS was activated.  Upon EMS arrival the patient was hyperglycemic with CBG in the 300s.  Patient was admitted with prerenal AKI in the setting of dehydration and was also noted to be hypovolemic and hyponatremic.  She is noted to have UTI with bilateral staghorn calculi and left emphysematous pyelitis.  She was switched to Merrem  for antibiotic treatment given  persistent fevers on 12/12.  Case was discussed with urology on 12/15 with recommendation for left-sided nephrostomy tube placement and IR has been consulted with current plan for Plavix  washout and nephrostomy tube placement on 12/19.   HOSPITAL COURSE BY LISTED PROBLEMS ADDRESSED   AKI, prerenal in the setting of dehydration from poor oral intake-improving Patient from SNF. Creatinine 1.73 on presentation, improving to 1.1, baseline is 0.7.  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 coli UTI   CT abdomen pelvis 12/12 shows bilateral staghorn calculi with left emphysematous pyelitis.  Patient received IV ceftriaxone  for 3 doses +1-day of cefepime .  Switched to meropenem  based on culture.   - Patient has history of known kidney stones on both sides and has seen urology in the past but looks like lost to follow-up - Patient denies abdominal pain and is nontender to exam - Will monitor fever curve.  She is afebrile for > 48 hours now - Appreciate IR consultation for nephrostomy tube placement on 12/19 and remain off of Plavix  for now, resume on 12/22 - Continue treatment with Merrem  per Dr. Overton with ID recommendations and OPAT orders  - Pt to follow up with [facility] on 08/26/24 at 9:30 am - Pt to follow up with [facility] [place] [facility] (ambulatory referral was placed)     Uncontrolled type 2 diabetes  with hyperglycemia and hypoglycemia Presented with serum glucose of 306 Last hemoglobin A1c 8.3 on 06/04/2023 Heart healthy carb modified diet She is now back on lantus  10 units once daily Hyperglycemia from steroids given this morning prior to procedure  Dietitian  consultation appreciated  CBG (last 3)  Recent Labs    08/08/24 0345 08/08/24 0703 08/08/24 1112  GLUCAP 110* 99 164*   Hypernatremia/hypokalemia Replete potassium further   Generalized weakness PT OT evaluation with recommendation for SNF on discharge Fall precautions. Return to LTC     History of prior CVA with hemiparesis Resume home regimen Prior to admission on Plavix  and Lipitor. SLP evaluation with recommendation for dys 2 diet medications: crushed wit puree, thin liquids, via cup; straw   Hypothyroidism Resumed home levothyroxine .   Chronic anxiety/depression Resumed home regimen.  Discharge Diagnoses:  Principal Problem:   AKI (acute kidney injury) Active Problems:   Diabetes mellitus (HCC)   History of TIAs   H/O: CVA (cerebrovascular accident)   Hypernatremia   Dehydration   Hypothyroidism   Discharge Instructions: Discharge Instructions     Ambulatory referral to Urology   Complete by: As directed    Change dressing on IV access line weekly and PRN   Complete by: As directed    Flush IV access with Sodium Chloride  0.9% and Heparin  10 units/ml or 100 units/ml   Complete by: As directed       Allergies as of 08/08/2024       Reactions   Penicillins Rash, Swelling   Iodinated Contrast Media Swelling   Per pt: swelling with first contrast xray in 1969. Second contrast xray in the 90s without symptoms:  Patient has been pre-medicated for CT scans in 2016. Needs to have pre-medications before any scans with IV contrast. / TSF 08/30/16        Medication List     TAKE these medications    acetaminophen  325 MG tablet Commonly known as: TYLENOL  Take 650 mg by mouth every 6 (six) hours as needed for fever or mild pain (pain score 1-3).   atorvastatin  10 MG tablet Commonly known as: LIPITOR Take 10 mg by mouth every evening.   clopidogrel  75 MG tablet Commonly known as: PLAVIX  Take 1 tablet (75 mg total) by mouth daily. Start taking on:  August 10, 2024 What changed: These instructions start on August 10, 2024. If you are unsure what to do until then, ask your doctor or other care provider.   ertapenem  IVPB Commonly known as: INVANZ  Inject 1 g into the vein daily for 20 days. Indication:  Complicated UTI with ESBL E coli  Last Day of Therapy:  08/29/2024 Labs - Once weekly:  CBC/D and CMP Method of administration: Mini-Bag Plus / Gravity Method of administration may be changed at the discretion of facility and its pharmacy Please leave PICC in place until doctor has seen patient or been notified Fax weekly labs to [phone] Start taking on: August 09, 2024   estradiol 0.01 % Crea vaginal cream Commonly known as: ESTRACE Place 1 Applicatorful vaginally every Monday, Wednesday, and Friday.   feeding supplement (GLUCERNA SHAKE) Liqd Take 237 mLs by mouth 3 (three) times daily between meals.   ferrous sulfate  325 (65 FE) MG tablet Take 325 mg by mouth 2 (two) times daily with a meal.   gabapentin  300 MG capsule Commonly known as: NEURONTIN  Take 1 capsule (300 mg total) by mouth at bedtime. What changed: Another medication with the same name was  removed. Continue taking this medication, and follow the directions you see here.   insulin  glargine 100 UNIT/ML injection Commonly known as: LANTUS  Inject 0.1 mLs (10 Units total) into the skin at bedtime. What changed: when to take this   insulin  lispro 100 UNIT/ML KwikPen Commonly known as: HUMALOG Inject 0-12 Units into the skin See admin instructions. Inject 3 times daily with meals per sliding scale:     0 - 150 : 0; 151 - 200 : 2; 201 - 250 : 4; 251 - 300 : 6; 301 - 350 : 8; 351 - 400 : 10; > 401 : 12 units and call provider   levothyroxine  50 MCG tablet Commonly known as: SYNTHROID  Take 50 mcg by mouth daily before breakfast.   LORazepam  0.5 MG tablet Commonly known as: ATIVAN  Take 1 tablet (0.5 mg total) by mouth 2 (two) times daily.    MELATONIN PO Take 6 mg by mouth at bedtime.   multivitamin with minerals tablet Take 1 tablet by mouth daily.   nitroGLYCERIN  0.4 MG SL tablet Commonly known as: NITROSTAT  Place 0.4 mg under the tongue every 5 (five) minutes as needed for chest pain.   ondansetron  4 MG tablet Commonly known as: ZOFRAN  Take 4 mg by mouth every 8 (eight) hours as needed for nausea or vomiting.   sennosides-docusate sodium  8.6-50 MG tablet Commonly known as: SENOKOT-S Take 1 tablet by mouth daily.   sertraline  50 MG tablet Commonly known as: ZOLOFT  Take 50 mg by mouth daily.               Discharge Care Instructions  (From admission, onward)           Start     Ordered   08/08/24 0000  Change dressing on IV access line weekly and PRN  (Home infusion instructions - [facility] )        08/08/24 1136            Follow-up Information     [facility] [place]. [facility] Follow up on 08/26/2024.   Specialty: Infectious Diseases Why: Hospital Follow Up at 9:30 am Contact information: [address] [place], [address] [place]  [address] [phone]        [facility]. Schedule an appointment as soon as possible for a visit in 2 week(s).   Why: Hospital Follow Up Contact information: [address] [place]  [address] [phone]        Celine Erla Bong, MD. Schedule an appointment as soon as possible for a visit in 1 week(s).   Specialty: Internal Medicine Why: Hospital Follow Up Contact information: [address] [place] [address] [phone]                Allergies[1] Allergies as of 08/08/2024       Reactions   Penicillins Rash, Swelling   Iodinated Contrast Media Swelling   Per pt: swelling with first contrast xray in 1969. Second contrast xray in the 90s without symptoms:  Patient has been pre-medicated for CT  scans in 2016. Needs to have pre-medications before any scans with IV contrast. / TSF 08/30/16        Medication List     TAKE these medications    acetaminophen  325 MG tablet Commonly known as: TYLENOL  Take 650 mg by mouth every 6 (six) hours as needed for fever or mild pain (pain score  1-3).   atorvastatin  10 MG tablet Commonly known as: LIPITOR Take 10 mg by mouth every evening.   clopidogrel  75 MG tablet Commonly known as: PLAVIX  Take 1 tablet (75 mg total) by mouth daily. Start taking on: August 10, 2024 What changed: These instructions start on August 10, 2024. If you are unsure what to do until then, ask your doctor or other care provider.   ertapenem  IVPB Commonly known as: INVANZ  Inject 1 g into the vein daily for 20 days. Indication:  Complicated UTI with ESBL E coli  Last Day of Therapy:  08/29/2024 Labs - Once weekly:  CBC/D and CMP Method of administration: Mini-Bag Plus / Gravity Method of administration may be changed at the discretion of facility and its pharmacy Please leave PICC in place until doctor has seen patient or been notified Fax weekly labs to [phone] Start taking on: August 09, 2024   estradiol 0.01 % Crea vaginal cream Commonly known as: ESTRACE Place 1 Applicatorful vaginally every Monday, Wednesday, and Friday.   feeding supplement (GLUCERNA SHAKE) Liqd Take 237 mLs by mouth 3 (three) times daily between meals.   ferrous sulfate  325 (65 FE) MG tablet Take 325 mg by mouth 2 (two) times daily with a meal.   gabapentin  300 MG capsule Commonly known as: NEURONTIN  Take 1 capsule (300 mg total) by mouth at bedtime. What changed: Another medication with the same name was removed. Continue taking this medication, and follow the directions you see here.   insulin  glargine 100 UNIT/ML injection Commonly known as: LANTUS  Inject 0.1 mLs (10 Units total) into the skin at bedtime. What changed: when to take this   insulin  lispro 100  UNIT/ML KwikPen Commonly known as: HUMALOG Inject 0-12 Units into the skin See admin instructions. Inject 3 times daily with meals per sliding scale:     0 - 150 : 0; 151 - 200 : 2; 201 - 250 : 4; 251 - 300 : 6; 301 - 350 : 8; 351 - 400 : 10; > 401 : 12 units and call provider   levothyroxine  50 MCG tablet Commonly known as: SYNTHROID  Take 50 mcg by mouth daily before breakfast.   LORazepam  0.5 MG tablet Commonly known as: ATIVAN  Take 1 tablet (0.5 mg total) by mouth 2 (two) times daily.   MELATONIN PO Take 6 mg by mouth at bedtime.   multivitamin with minerals tablet Take 1 tablet by mouth daily.   nitroGLYCERIN  0.4 MG SL tablet Commonly known as: NITROSTAT  Place 0.4 mg under the tongue every 5 (five) minutes as needed for chest pain.   ondansetron  4 MG tablet Commonly known as: ZOFRAN  Take 4 mg by mouth every 8 (eight) hours as needed for nausea or vomiting.   sennosides-docusate sodium  8.6-50 MG tablet Commonly known as: SENOKOT-S Take 1 tablet by mouth daily.   sertraline  50 MG tablet Commonly known as: ZOLOFT  Take 50 mg by mouth daily.               Discharge Care Instructions  (From admission, onward)           Start     Ordered   08/08/24 0000  Change dressing on IV access line weekly and PRN  (Home infusion instructions - [facility] )        08/08/24 1136            Procedures/Studies: DG Chest Portable 1 View Result Date: 08/07/2024 EXAM: 1 VIEW(S) XRAY OF THE CHEST 08/07/2024  04:37:59 PM COMPARISON: 07/28/2024 CLINICAL HISTORY: PICC Placement please FINDINGS: LINES, TUBES AND DEVICES: Right PICC in place with tip in low SVC. LUNGS AND PLEURA: No focal pulmonary opacity. No pleural effusion. No pneumothorax. HEART AND MEDIASTINUM: Implantable loop recorder noted. No acute abnormality of the cardiac and mediastinal silhouettes. BONES AND SOFT TISSUES: Remote right rib fractures. No acute osseous abnormality. Osteopenia.  IMPRESSION: 1. Right PICC in place with tip in the low SVC. No pneumothorax. Electronically signed by: Rogelia Myers MD 08/07/2024 05:31 PM EST RP Workstation: [id number]   US  EKG SITE RITE Result Date: 08/07/2024 If Site Rite image not attached, placement could not be confirmed due to current cardiac rhythm.  CT CHEST ABDOMEN PELVIS WO CONTRAST Result Date: 07/31/2024 CLINICAL DATA:  Sepsis and fever. EXAM: CT CHEST, ABDOMEN AND PELVIS WITHOUT CONTRAST TECHNIQUE: Multidetector CT imaging of the chest, abdomen and pelvis was performed following the standard protocol without IV contrast. RADIATION DOSE REDUCTION: This exam was performed according to the departmental dose-optimization program which includes automated exposure control, adjustment of the mA and/or kV according to patient size and/or use of iterative reconstruction technique. COMPARISON:  05/10/2022 FINDINGS: CT CHEST FINDINGS Cardiovascular: The heart size is upper normal to borderline enlarged. No substantial pericardial effusion. Coronary artery calcification is evident. Mild atherosclerotic calcification is noted in the wall of the thoracic aorta. Mediastinum/Nodes: No mediastinal lymphadenopathy. No evidence for gross hilar lymphadenopathy although assessment is limited by the lack of intravenous contrast on the current study. The esophagus has normal imaging features. There is no axillary lymphadenopathy. Lungs/Pleura: No suspicious pulmonary nodule or mass. Asymmetric ground-glass opacity in the right lower lobe is probably atelectasis although component of pneumonia is not excluded. No suspicious pulmonary nodule or mass. No pulmonary edema or pleural effusion. Musculoskeletal: No worrisome lytic or sclerotic osseous abnormality. CT ABDOMEN PELVIS FINDINGS Hepatobiliary: No suspicious focal abnormality in the liver on this study without intravenous contrast. A tiny hypodensity in the liver parenchyma is too small to characterize but is  statistically most likely benign. No followup imaging is recommended. Gallbladder is surgically absent. No intrahepatic or extrahepatic biliary dilation. Pancreas: No focal mass lesion. No dilatation of the main duct. No intraparenchymal cyst. No peripancreatic edema. Spleen: No splenomegaly. No suspicious focal mass lesion. Adrenals/Urinary Tract: Nodular thickening of both adrenal glands is similar to prior. Large staghorn calculi are seen in both kidneys, largely filling the renal calices and renal pelvis bilaterally. There is associated gas in the left intrarenal collecting system. No hydroureter. The urinary bladder appears normal for the degree of distention. Stomach/Bowel: Stomach is decompressed. Duodenum is normally positioned as is the ligament of Treitz. No small bowel wall thickening. No small bowel dilatation. The terminal ileum is normal. The appendix is not well visualized, but there is no edema or inflammation in the region of the cecal tip to suggest appendicitis. No gross colonic mass. No colonic wall thickening. Large stool volume noted in the rectum. Vascular/Lymphatic: There is moderate atherosclerotic calcification of the abdominal aorta without aneurysm. There is no gastrohepatic or hepatoduodenal ligament lymphadenopathy. No retroperitoneal or mesenteric lymphadenopathy. No pelvic sidewall lymphadenopathy. Reproductive: Hysterectomy.  There is no adnexal mass. Other: No intraperitoneal free fluid. Musculoskeletal: Scattered foci of gas in the subcutaneous fat of the lower anterior abdominal wall likely secondary to injection. Bones are diffusely demineralized. Sclerotic lesion left iliac bone is stable since prior, likely benign. Fixation hardware in the proximal right femur has been incompletely visualized. IMPRESSION: 1. Large staghorn calculi in both  kidneys, largely filling the renal calices and renal pelves bilaterally. There is associated gas in the left intrarenal collecting system.  Imaging features are most suggestive of left-sided emphysematous pyelitis. No definite parenchymal gas at this time to suggest emphysematous pyonephrosis. 2. Asymmetric ground-glass opacity in the right lower lobe is probably atelectasis although component of pneumonia is not excluded. 3. Large stool volume in the rectum. Correlation for signs/symptoms of fecal impaction recommended. 4. Aortic Atherosclerosis (ICD10-I70.0) and Emphysema (ICD10-J43.9). These results will be called to the ordering clinician or representative by the Radiologist Assistant, and communication documented in the PACS or [employer]. Electronically Signed   By: Camellia Candle M.D.   On: 07/31/2024 15:23   DG Chest Port 1 View if patient is in a treatment room. Result Date: 07/28/2024 CLINICAL DATA:  Suspected sepsis. EXAM: PORTABLE CHEST 1 VIEW COMPARISON:  Chest radiograph dated 06/15/2023. FINDINGS: No focal consolidation, pleural effusion, pneumothorax. Stable cardiac silhouette. Loop recorder device. No acute osseous pathology. IMPRESSION: No active disease. Electronically Signed   By: Vanetta Chou M.D.   On: 07/28/2024 20:01     Subjective: No specific complaints.   Discharge Exam: Vitals:   08/07/24 2106 08/08/24 0346  BP: (!) 142/69 (!) 137/59  Pulse: 74 62  Resp: 16 12  Temp: 98.7 F (37.1 C) 98.9 F (37.2 C)  SpO2: 99% 99%   Vitals:   08/07/24 1330 08/07/24 1350 08/07/24 2106 08/08/24 0346  BP: (!) 142/61 (!) 145/65 (!) 142/69 (!) 137/59  Pulse: 75 79 74 62  Resp: 13 16 16 12   Temp:   98.7 F (37.1 C) 98.9 F (37.2 C)  TempSrc:   Oral Oral  SpO2: 93% 95% 99% 99%  Weight:      Height:       General: Pt is alert, awake, not in acute distress Cardiovascular: normal S1/S2 +, no rubs, no gallops Respiratory: CTA bilaterally, no wheezing, no rhonchi Abdominal: Soft, NT, ND, bowel sounds +, urostomy in place with amber clear urine seen in bag.  Extremities: no edema, no cyanosis   The results  of significant diagnostics from this hospitalization (including imaging, microbiology, ancillary and laboratory) are listed below for reference.     Microbiology: Recent Results (from the past 240 hours)  Urine Culture     Status: Abnormal   Collection Time: 08/02/24  1:18 PM   Specimen: Urine, Random  Result Value Ref Range Status   Specimen Description   Final    URINE, RANDOM Performed at [facility], [address], [place], [place] [address]    Special Requests   Final    NONE Reflexed from [id number] Performed at [facility], [address] [place], [place] [address]    Culture (A)  Final    >=100,000 COLONIES/mL ESCHERICHIA COLI Confirmed Extended Spectrum Beta-Lactamase Producer (ESBL).  In bloodstream infections from ESBL organisms, carbapenems are preferred over piperacillin/tazobactam. They are shown to have a lower risk of mortality.    Report Status 08/04/2024 FINAL  Final   Organism ID, Bacteria ESCHERICHIA COLI (A)  Final      Susceptibility   Escherichia coli - MIC*    AMPICILLIN >=32 RESISTANT Resistant     CEFAZOLIN (URINE) Value in next row Resistant      >=32 RESISTANTThis is a modified FDA-approved test that has been validated and its performance characteristics determined by the reporting laboratory.  This laboratory is certified under the Clinical Laboratory Improvement Amendments CLIA as qualified to perform high complexity clinical  laboratory testing.    CEFEPIME  Value in next row Resistant      >=32 RESISTANTThis is a modified FDA-approved test that has been validated and its performance characteristics determined by the reporting laboratory.  This laboratory is certified under the Clinical Laboratory Improvement Amendments CLIA as qualified to perform high complexity clinical laboratory testing.    ERTAPENEM  Value in next row Sensitive      >=32 RESISTANTThis is a modified FDA-approved test that has been validated and its performance characteristics determined  by the reporting laboratory.  This laboratory is certified under the Clinical Laboratory Improvement Amendments CLIA as qualified to perform high complexity clinical laboratory testing.    CEFTRIAXONE  Value in next row Resistant      >=32 RESISTANTThis is a modified FDA-approved test that has been validated and its performance characteristics determined by the reporting laboratory.  This laboratory is certified under the Clinical Laboratory Improvement Amendments CLIA as qualified to perform high complexity clinical laboratory testing.    CIPROFLOXACIN  Value in next row Resistant      >=32 RESISTANTThis is a modified FDA-approved test that has been validated and its performance characteristics determined by the reporting laboratory.  This laboratory is certified under the Clinical Laboratory Improvement Amendments CLIA as qualified to perform high complexity clinical laboratory testing.    GENTAMICIN Value in next row Sensitive      >=32 RESISTANTThis is a modified FDA-approved test that has been validated and its performance characteristics determined by the reporting laboratory.  This laboratory is certified under the Clinical Laboratory Improvement Amendments CLIA as qualified to perform high complexity clinical laboratory testing.    NITROFURANTOIN Value in next row Sensitive      >=32 RESISTANTThis is a modified FDA-approved test that has been validated and its performance characteristics determined by the reporting laboratory.  This laboratory is certified under the Clinical Laboratory Improvement Amendments CLIA as qualified to perform high complexity clinical laboratory testing.    TRIMETH/SULFA Value in next row Resistant      >=32 RESISTANTThis is a modified FDA-approved test that has been validated and its performance characteristics determined by the reporting laboratory.  This laboratory is certified under the Clinical Laboratory Improvement Amendments CLIA as qualified to perform high  complexity clinical laboratory testing.    AMPICILLIN/SULBACTAM Value in next row Resistant      >=32 RESISTANTThis is a modified FDA-approved test that has been validated and its performance characteristics determined by the reporting laboratory.  This laboratory is certified under the Clinical Laboratory Improvement Amendments CLIA as qualified to perform high complexity clinical laboratory testing.    PIP/TAZO Value in next row Intermediate      64 INTERMEDIATEThis is a modified FDA-approved test that has been validated and its performance characteristics determined by the reporting laboratory.  This laboratory is certified under the Clinical Laboratory Improvement Amendments CLIA as qualified to perform high complexity clinical laboratory testing.    MEROPENEM  Value in next row Sensitive      64 INTERMEDIATEThis is a modified FDA-approved test that has been validated and its performance characteristics determined by the reporting laboratory.  This laboratory is certified under the Clinical Laboratory Improvement Amendments CLIA as qualified to perform high complexity clinical laboratory testing.    * >=100,000 COLONIES/mL ESCHERICHIA COLI  MRSA Next Gen by PCR, Nasal     Status: Abnormal   Collection Time: 08/03/24 10:55 AM   Specimen: Nasal Mucosa; Nasal Swab  Result Value Ref Range Status   MRSA by PCR  Next Gen DETECTED (A) NOT DETECTED Final    Comment: RESULT CALLED TO, READ BACK BY AND VERIFIED WITH: S STONE AT 1921 ON [id number] BY S DALTON (NOTE) The GeneXpert MRSA Assay (FDA approved for NASAL specimens only), is one component of a comprehensive MRSA colonization surveillance program. It is not intended to diagnose MRSA infection nor to guide or monitor treatment for MRSA infections. Test performance is not FDA approved in patients less than 48 years old. Performed at [facility], [address] [place], [place] [address]      Labs: BNP (last 3 results) No results for input(s):  BNP in the last 8760 hours. Basic Metabolic Panel: Recent Labs  Lab 08/02/24 0632 08/03/24 0530 08/04/24 0512 08/05/24 0458 08/07/24 0510  NA 148* 146* 150* 144 144  K 3.5 3.8 3.3* 3.3* 3.7  CL 117* 115* 117* 111 110  CO2 24 22 26 24 29   GLUCOSE 194* 227* 56* 161* 183*  BUN 17 16 14 15 16   CREATININE 1.18* 1.13* 1.12* 0.89 1.00  CALCIUM  8.6* 8.8* 9.0 8.6* 8.7*  MG  --   --  2.3 1.8  --    Liver Function Tests: No results for input(s): AST, ALT, ALKPHOS, BILITOT, PROT, ALBUMIN in the last 168 hours. No results for input(s): LIPASE, AMYLASE in the last 168 hours. No results for input(s): AMMONIA in the last 168 hours. CBC: Recent Labs  Lab 08/02/24 0632 08/03/24 0530 08/04/24 0512 08/05/24 0458 08/07/24 0510  WBC 12.4* 12.8* 11.7* 12.6* 10.3  NEUTROABS  --  10.3*  --   --   --   HGB 10.9* 11.7* 11.7* 10.6* 10.2*  HCT 35.0* 38.7 37.9 34.4* 33.1*  MCV 92.1 95.1 93.3 92.0 91.9  PLT 207 208 186 173 199   Cardiac Enzymes: No results for input(s): CKTOTAL, CKMB, CKMBINDEX, TROPONINI in the last 168 hours. BNP: Invalid input(s): POCBNP CBG: Recent Labs  Lab 08/07/24 1639 08/07/24 2046 08/08/24 0345 08/08/24 0703 08/08/24 1112  GLUCAP 310* 255* 110* 99 164*   D-Dimer No results for input(s): DDIMER in the last 72 hours. Hgb A1c No results for input(s): HGBA1C in the last 72 hours. Lipid Profile No results for input(s): CHOL, HDL, LDLCALC, TRIG, CHOLHDL, LDLDIRECT in the last 72 hours. Thyroid  function studies No results for input(s): TSH, T4TOTAL, T3FREE, THYROIDAB in the last 72 hours.  Invalid input(s): FREET3 Anemia work up No results for input(s): VITAMINB12, FOLATE, FERRITIN, TIBC, IRON, RETICCTPCT in the last 72 hours. Urinalysis    Component Value Date/Time   COLORURINE YELLOW 08/02/2024 1318   APPEARANCEUR HAZY (A) 08/02/2024 1318   APPEARANCEUR Clear 04/18/2022 1133   LABSPEC 1.014  08/02/2024 1318   PHURINE 6.0 08/02/2024 1318   GLUCOSEU 150 (A) 08/02/2024 1318   HGBUR MODERATE (A) 08/02/2024 1318   BILIRUBINUR NEGATIVE 08/02/2024 1318   BILIRUBINUR Negative 04/18/2022 1133   KETONESUR NEGATIVE 08/02/2024 1318   PROTEINUR 100 (A) 08/02/2024 1318   UROBILINOGEN 0.2 11/17/2014 1100   NITRITE POSITIVE (A) 08/02/2024 1318   LEUKOCYTESUR LARGE (A) 08/02/2024 1318   Sepsis Labs Recent Labs  Lab 08/03/24 0530 08/04/24 0512 08/05/24 0458 08/07/24 0510  WBC 12.8* 11.7* 12.6* 10.3   Microbiology Recent Results (from the past 240 hours)  Urine Culture     Status: Abnormal   Collection Time: 08/02/24  1:18 PM   Specimen: Urine, Random  Result Value Ref Range Status   Specimen Description   Final    URINE, RANDOM Performed at [facility]  [facility], [address] [place], [place] [address]    Special Requests   Final    NONE Reflexed from [id number] Performed at [facility], [address], [place], [place] [address]    Culture (A)  Final    >=100,000 COLONIES/mL ESCHERICHIA COLI Confirmed Extended Spectrum Beta-Lactamase Producer (ESBL).  In bloodstream infections from ESBL organisms, carbapenems are preferred over piperacillin/tazobactam. They are shown to have a lower risk of mortality.    Report Status 08/04/2024 FINAL  Final   Organism ID, Bacteria ESCHERICHIA COLI (A)  Final      Susceptibility   Escherichia coli - MIC*    AMPICILLIN >=32 RESISTANT Resistant     CEFAZOLIN (URINE) Value in next row Resistant      >=32 RESISTANTThis is a modified FDA-approved test that has been validated and its performance characteristics determined by the reporting laboratory.  This laboratory is certified under the Clinical Laboratory Improvement Amendments CLIA as qualified to perform high complexity clinical laboratory testing.    CEFEPIME  Value in next row Resistant      >=32 RESISTANTThis is a modified FDA-approved test that has been validated and its performance characteristics  determined by the reporting laboratory.  This laboratory is certified under the Clinical Laboratory Improvement Amendments CLIA as qualified to perform high complexity clinical laboratory testing.    ERTAPENEM  Value in next row Sensitive      >=32 RESISTANTThis is a modified FDA-approved test that has been validated and its performance characteristics determined by the reporting laboratory.  This laboratory is certified under the Clinical Laboratory Improvement Amendments CLIA as qualified to perform high complexity clinical laboratory testing.    CEFTRIAXONE  Value in next row Resistant      >=32 RESISTANTThis is a modified FDA-approved test that has been validated and its performance characteristics determined by the reporting laboratory.  This laboratory is certified under the Clinical Laboratory Improvement Amendments CLIA as qualified to perform high complexity clinical laboratory testing.    CIPROFLOXACIN  Value in next row Resistant      >=32 RESISTANTThis is a modified FDA-approved test that has been validated and its performance characteristics determined by the reporting laboratory.  This laboratory is certified under the Clinical Laboratory Improvement Amendments CLIA as qualified to perform high complexity clinical laboratory testing.    GENTAMICIN Value in next row Sensitive      >=32 RESISTANTThis is a modified FDA-approved test that has been validated and its performance characteristics determined by the reporting laboratory.  This laboratory is certified under the Clinical Laboratory Improvement Amendments CLIA as qualified to perform high complexity clinical laboratory testing.    NITROFURANTOIN Value in next row Sensitive      >=32 RESISTANTThis is a modified FDA-approved test that has been validated and its performance characteristics determined by the reporting laboratory.  This laboratory is certified under the Clinical Laboratory Improvement Amendments CLIA as qualified to perform high  complexity clinical laboratory testing.    TRIMETH/SULFA Value in next row Resistant      >=32 RESISTANTThis is a modified FDA-approved test that has been validated and its performance characteristics determined by the reporting laboratory.  This laboratory is certified under the Clinical Laboratory Improvement Amendments CLIA as qualified to perform high complexity clinical laboratory testing.    AMPICILLIN/SULBACTAM Value in next row Resistant      >=32 RESISTANTThis is a modified FDA-approved test that has been validated and its performance characteristics determined by the reporting laboratory.  This laboratory is certified under the Clinical  Laboratory Improvement Amendments CLIA as qualified to perform high complexity clinical laboratory testing.    PIP/TAZO Value in next row Intermediate      64 INTERMEDIATEThis is a modified FDA-approved test that has been validated and its performance characteristics determined by the reporting laboratory.  This laboratory is certified under the Clinical Laboratory Improvement Amendments CLIA as qualified to perform high complexity clinical laboratory testing.    MEROPENEM  Value in next row Sensitive      64 INTERMEDIATEThis is a modified FDA-approved test that has been validated and its performance characteristics determined by the reporting laboratory.  This laboratory is certified under the Clinical Laboratory Improvement Amendments CLIA as qualified to perform high complexity clinical laboratory testing.    * >=100,000 COLONIES/mL ESCHERICHIA COLI  MRSA Next Gen by PCR, Nasal     Status: Abnormal   Collection Time: 08/03/24 10:55 AM   Specimen: Nasal Mucosa; Nasal Swab  Result Value Ref Range Status   MRSA by PCR Next Gen DETECTED (A) NOT DETECTED Final    Comment: RESULT CALLED TO, READ BACK BY AND VERIFIED WITH: S STONE AT 1921 ON [id number] BY S DALTON (NOTE) The GeneXpert MRSA Assay (FDA approved for NASAL specimens only), is one component of a  comprehensive MRSA colonization surveillance program. It is not intended to diagnose MRSA infection nor to guide or monitor treatment for MRSA infections. Test performance is not FDA approved in patients less than 89 years old. Performed at [facility], [address] [place], [place] [address]     Time coordinating discharge:  36 mins   SIGNED:  Afton Louder, MD  [facility] 08/08/2024, 11:39 AM How to contact the [facility] Attending or Consulting provider 7A - 7P or covering provider during after hours 7P -7A, for this patient?  Check the care team in [facility] and look for a) attending/consulting TRH provider listed and b) the TRH team listed Log into [email or web address] and use [place]'s universal password to access. If you do not have the password, please contact the hospital operator. Locate the TRH provider you are looking for under [facility] and page to a number that you can be directly reached. If you still have difficulty reaching the provider, please page the [facility] (Director on Call) for the Hospitalists listed on amion for assistance.     [1]  Allergies Allergen Reactions   Penicillins Rash and Swelling   Iodinated Contrast Media Swelling    Per pt: swelling with first contrast xray in 1969. Second contrast xray in the 90s without symptoms:  Patient has been pre-medicated for CT scans in 2016. Needs to have pre-medications before any scans with IV contrast. / TSF 08/30/16

## 2024-08-08 NOTE — TOC Transition Note (Signed)
 Transition of Care [facility]) - Discharge Note   Patient Details  Name: Dawn Ho MRN: [id number] Date of Birth: 1951/08/03  [facility] CM/SW Contact:  Dawn Ho Dawn Ho Phone Number: 08/08/2024, 10:13 AM   Clinical Narrative:     CSW called son this morning and made him aware of patient DC today back to [facility]. CSW provided nurse with report and room number. Med necessity completed. EMS has been called. CSW signing off.  Final next level of care: Skilled Nursing Facility Barriers to Discharge: Barriers Resolved   Patient Goals and CMS Choice Patient states their goals for this hospitalization and ongoing recovery are:: return back to [facility].gov Compare Post Acute Care list provided to:: Patient Represenative (must comment) (Son [place]) Choice offered to / list presented to : Adult Children [place] ownership interest in [facility].provided to:: Adult Children    Discharge Placement              Patient [facility] bed at: [facility] Patient to be transferred to facility by: [facility] Name of family member notified: [name]- Son Patient and family notified of of transfer: 08/08/24  Discharge Plan and Services Additional resources added to the After Visit Summary for   In-house Referral: Clinical Social Work Discharge Planning Services: NA Post Acute Care Choice: Skilled Nursing Facility, Resumption of Svcs/PTA Provider          DME Arranged: N/A DME Agency: NA                  Social Drivers of Health (SDOH) Interventions SDOH Screenings   Food Insecurity: Patient Unable To Answer (07/29/2024)  Housing: Unknown (07/29/2024)  Transportation Needs: Patient Unable To Answer (07/29/2024)  Utilities: Patient Unable To Answer (07/29/2024)  Social Connections: Patient Unable To Answer (07/29/2024)  Tobacco Use: Low Risk (07/28/2024)     Readmission Risk Interventions    08/08/2024   10:04 AM 06/17/2023    3:42 PM  Readmission Risk  Prevention Plan  Transportation Screening  Complete  Home Care Screening Complete Complete  Medication Review (RN CM) Complete Complete

## 2024-08-08 NOTE — Progress Notes (Signed)
 Have tried calling report to [facility] 4 times, no answer ans no voice mail picks up. [phone]

## 2024-08-08 NOTE — Plan of Care (Signed)

## 2024-08-08 NOTE — Discharge Instructions (Signed)
 IMPORTANT INFORMATION: PAY CLOSE ATTENTION   PHYSICIAN DISCHARGE INSTRUCTIONS  Follow with Primary care provider  Celine Erla Bong, MD  and other consultants as instructed by your Hospitalist Physician  SEEK MEDICAL CARE OR RETURN TO EMERGENCY ROOM IF SYMPTOMS COME BACK, WORSEN OR NEW PROBLEM DEVELOPS   Please note: You were cared for by a hospitalist during your hospital stay. Every effort will be made to forward records to your primary care provider.  You can request that your primary care provider send for your hospital records if they have not received them.  Once you are discharged, your primary care physician will handle any further medical issues. Please note that NO REFILLS for any discharge medications will be authorized once you are discharged, as it is imperative that you return to your primary care physician (or establish a relationship with a primary care physician if you do not have one) for your post hospital discharge needs so that they can reassess your need for medications and monitor your lab values.  Please get a complete blood count and chemistry panel checked by your Primary MD at your next visit, and again as instructed by your Primary MD.  Get Medicines reviewed and adjusted: Please take all your medications with you for your next visit with your Primary MD  Laboratory/radiological data: Please request your Primary MD to go over all hospital tests and procedure/radiological results at the follow up, please ask your primary care provider to get all Hospital records sent to his/her office.  In some cases, they will be blood work, cultures and biopsy results pending at the time of your discharge. Please request that your primary care provider follow up on these results.  If you are diabetic, please bring your blood sugar readings with you to your follow up appointment with primary care.    Please call and make your follow up appointments as soon as possible.     Also Note the following: If you experience worsening of your admission symptoms, develop shortness of breath, life threatening emergency, suicidal or homicidal thoughts you must seek medical attention immediately by calling 911 or calling your MD immediately  if symptoms less severe.  You must read complete instructions/literature along with all the possible adverse reactions/side effects for all the Medicines you take and that have been prescribed to you. Take any new Medicines after you have completely understood and accpet all the possible adverse reactions/side effects.   Do not drive when taking Pain medications or sleeping medications (Benzodiazepines)  Do not take more than prescribed Pain, Sleep and Anxiety Medications. It is not advisable to combine anxiety,sleep and pain medications without talking with your primary care practitioner  Special Instructions: If you have smoked or chewed Tobacco  in the last 2 yrs please stop smoking, stop any regular Alcohol  and or any Recreational drug use.  Wear Seat belts while driving.  Do not drive if taking any narcotic, mind altering or controlled substances or recreational drugs or alcohol.

## 2024-08-17 NOTE — Progress Notes (Addendum)
 "  Chief Complaint: Kidney stones   History of Present Illness: This 73 year old female presents for attention to recent admission to [facility] with subsequent diagnoses of persistent bilateral staghorn calculi as well as urinary tract infection/left sided emphysematous pyelonephritis.  She did have a left sided percutaneous nephrostomy tube placed at that time.  That is currently in place.  Hospitalized from 07/28/2024 to 08/08/2024.  She grew multiple drug-resistant E. coli.  Eventually treated with meropenem .  Seen before in this office, the last time in August 2023 by Dr. Roseann.  Because of large stone burden and the necessity for staged nephrolithotomy's, she was referred to [facility].  Currently, the patient is stable.  She is on estrogen cream.  He is still on meropenem .  She has follow-up with infectious disease on July 7.  Apparently will be getting repeat imaging.  Past Medical History:  Past Medical History:  Diagnosis Date   Cataract, left eye    Cerebral infarction (HCC)    Chronic atrial fibrillation (HCC)    Cognitive communication deficit    CVA (cerebral infarction)    Depression with anxiety    Diabetes mellitus    Dyslipidemia    Dysphagia    Encephalopathy    Fibromyalgia    Hypertension    Retinopathy    Stroke (HCC)    L. hemiparesis   TIA (transient ischemic attack)    Vascular dementia [facility])     Past Surgical History:  Past Surgical History:  Procedure Laterality Date   ABDOMINAL HYSTERECTOMY     age 67, removed one ovary- excessive bleeding   CESAREAN SECTION     CHOLECYSTECTOMY     IR NEPHROSTOMY PLACEMENT LEFT  08/07/2024   LOOP RECORDER IMPLANT     Dr. Kelsie placed 10/15/2014   ORIF HIP FRACTURE Right 09/10/2020   Procedure: im nail;  Surgeon: Barbarann Oneil BROCKS, MD;  Location: [facility] ORS;  Service: Orthopedics;  Laterality: Right;   TEE WITHOUT CARDIOVERSION N/A 10/15/2014   Procedure: TRANSESOPHAGEAL ECHOCARDIOGRAM (TEE);  Surgeon: Vina Okey GAILS, MD;  Location: [facility] ENDOSCOPY;  Service: Cardiovascular;  Laterality: N/A;    Allergies:  Allergies[1]  Family History:  Family History  Problem Relation Age of Onset   Heart disease Father    Hypertension Sister    Colon cancer Neg Hx    Colon polyps Neg Hx     Social History:  Social History[2]  Review of symptoms:  Constitutional:  Negative for unexplained weight loss, night sweats, fever, chills ENT:  Negative for nose bleeds, sinus pain, painful swallowing CV:  Negative for chest pain, shortness of breath, exercise intolerance, palpitations, loss of consciousness Resp:  Negative for cough, wheezing, shortness of breath GI:  Negative for nausea, vomiting, diarrhea, bloody stools GU:  Positives noted in HPI; otherwise negative for gross hematuria, dysuria, urinary incontinence Neuro:  Negative for seizures, poor balance, limb weakness, slurred speech Psych:  Negative for lack of energy, depression, anxiety Endocrine:  Negative for polydipsia, polyuria, symptoms of hypoglycemia (dizziness, hunger, sweating) Hematologic:  Negative for anemia, purpura, petechia, prolonged or excessive bleeding, use of anticoagulants  Allergic:  Negative for difficulty breathing or choking as a result of exposure to anything; no shellfish allergy; no allergic response (rash/itch) to materials, foods  Physical exam: There were no vitals taken for this visit. GENERAL APPEARANCE: Comfortable.  Hard of hearing.  Not necessarily oriented. SKIN:  Warm, dry and intact.    Results: Prior urology records reviewed  [facility]  records reviewed  Imaging reviewed-huge stones bilaterally.  Renal atrophy but no hydro.  Follow-up CT scan on 09/01/2024 revealed resolution of gas in left kidney, persistence of nephrostomy tube, similar stone appearance.    Assessment: 1.  Enlarging bilateral staghorn calculi.  Even in a healthy patient, these would be difficult to treat.  With her current medical  comorbidities as well as her mental condition, I do not necessarily think it would be wise to proceed with treatment  2.  Recurring urinary tract infections, most recently multiple drug-resistant E. coli, currently being treated  3.  Recent emphysematous pyelonephritis.  Treated appropriately, currently has nephrostomy tube in   Plan: 1.  I spoke with the nursing staff [facility].  I let them know that I am aware of her recent hospitalization and recent history.  2.  I have spoken with the patient's sister, Rosaline Seats regarding management.  I think the risks of trying to get her stone free are greater than the risks of managing/preventing UTIs.  3.  The family agrees with the above management  4.  I would continue estrogen cream, also consider, once off of meropenem , suppressive agent such as vitamin C/methenamine hippurate  5.  We will have her follow-up in 3 to 4 months in [place] office       [1]  Allergies Allergen Reactions   Penicillins Rash and Swelling   Iodinated Contrast Media Swelling    Per pt: swelling with first contrast xray in 1969. Second contrast xray in the 90s without symptoms:  Patient has been pre-medicated for CT scans in 2016. Needs to have pre-medications before any scans with IV contrast. / TSF 08/30/16  [2]  Social History Tobacco Use   Smoking status: Never   Smokeless tobacco: Never  Vaping Use   Vaping status: Unknown  Substance Use Topics   Alcohol use: No   Drug use: No   "

## 2024-08-18 ENCOUNTER — Ambulatory Visit (INDEPENDENT_AMBULATORY_CARE_PROVIDER_SITE_OTHER): Admitting: Urology

## 2024-08-18 VITALS — BP 154/73 | HR 57

## 2024-08-18 DIAGNOSIS — N200 Calculus of kidney: Secondary | ICD-10-CM

## 2024-08-18 DIAGNOSIS — Z87440 Personal history of urinary (tract) infections: Secondary | ICD-10-CM | POA: Diagnosis not present

## 2024-08-18 DIAGNOSIS — N390 Urinary tract infection, site not specified: Secondary | ICD-10-CM | POA: Diagnosis not present

## 2024-08-18 DIAGNOSIS — Z960 Presence of urogenital implants: Secondary | ICD-10-CM | POA: Diagnosis not present

## 2024-08-20 LAB — LAB REPORT - SCANNED: EGFR: 52

## 2024-08-24 ENCOUNTER — Encounter: Payer: Self-pay | Admitting: Infectious Disease

## 2024-08-24 DIAGNOSIS — A498 Other bacterial infections of unspecified site: Secondary | ICD-10-CM | POA: Insufficient documentation

## 2024-08-24 DIAGNOSIS — N12 Tubulo-interstitial nephritis, not specified as acute or chronic: Secondary | ICD-10-CM | POA: Insufficient documentation

## 2024-08-24 NOTE — Progress Notes (Unsigned)
 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   MRN: [id number]  HPI  Past Medical History:  Diagnosis Date   Cataract, left eye    Cerebral infarction [facility])    Chronic atrial fibrillation (HCC)    Cognitive communication deficit    CVA (cerebral infarction)    Depression with anxiety    Diabetes mellitus    Dyslipidemia    Dysphagia    Encephalopathy    Fibromyalgia    Hypertension    Retinopathy    Stroke (HCC)    L. hemiparesis   TIA (transient ischemic attack)    Vascular dementia [facility])     Past Surgical History:  Procedure Laterality Date   ABDOMINAL HYSTERECTOMY     age 53, removed one ovary- excessive bleeding   CESAREAN SECTION     CHOLECYSTECTOMY     IR NEPHROSTOMY PLACEMENT LEFT  08/07/2024   LOOP RECORDER IMPLANT     Dr. Kelsie placed 10/15/2014   ORIF HIP FRACTURE Right 09/10/2020   Procedure: im nail;  Surgeon: Barbarann Oneil BROCKS, MD;  Location: [facility];  Service: Orthopedics;  Laterality: Right;   TEE WITHOUT CARDIOVERSION N/A 10/15/2014   Procedure: TRANSESOPHAGEAL ECHOCARDIOGRAM (TEE);  Surgeon: Vina Okey GAILS, MD;  Location: [facility] ENDOSCOPY;  Service: Cardiovascular;  Laterality: N/A;    Family History  Problem Relation Age of Onset   Heart disease Father    Hypertension Sister    Colon cancer Neg Hx    Colon polyps Neg Hx       Social History   Socioeconomic History   Marital status: Married    Spouse name: Not on file   Number of children: 2   Years of education: 16   Highest education level: Not on file  Occupational History   Occupation: retired    Comment: agricultural engineer   Tobacco Use   Smoking status: Never   Smokeless tobacco: Never  Vaping Use   Vaping status: Unknown  Substance and Sexual Activity   Alcohol use: No   Drug use: No   Sexual activity: Not Currently  Other Topics Concern   Not on file  Social History Narrative    Married, 2 children   Right handed   caffeinne use- occasionally   Social Drivers of Health   Tobacco Use: Low Risk (07/28/2024)   Patient History    Smoking Tobacco Use: Never    Smokeless Tobacco Use: Never    Passive Exposure: Not on file  Financial Resource Strain: Not on file  Food Insecurity: Patient Unable To Answer (07/29/2024)   Epic    Worried About Programme Researcher, Broadcasting/film/video in the Last Year: Patient unable to answer    Ran Out of Food in the Last Year: Patient unable to answer  Transportation Needs: Patient Unable To Answer (07/29/2024)   Epic    Lack of Transportation (Medical): Patient unable to answer    Lack of Transportation (Non-Medical): Patient unable to answer  Physical Activity: Not on file  Stress: Not on file  Social Connections: Patient Unable To Answer (07/29/2024)   Social Connection and Isolation Panel    Frequency of Communication with Friends and Family: Patient unable to answer    Frequency of Social Gatherings with Friends and Family: Patient unable to answer    Attends Religious Services: Patient unable to answer    Active Member  of Clubs or Organizations: Patient unable to answer    Attends Club or Organization Meetings: Patient unable to answer    Marital Status: Patient unable to answer  Depression (PHQ2-9): Not on file  Alcohol Screen: Not on file  Housing: Unknown (07/29/2024)   Epic    Unable to Pay for Housing in the Last Year: Patient unable to answer    Number of Times Moved in the Last Year: 0    Homeless in the Last Year: Patient unable to answer  Utilities: Patient Unable To Answer (07/29/2024)   Epic    Threatened with loss of utilities: Patient unable to answer  Health Literacy: Not on file    Allergies[1]  Current Medications[2]   Review of Systems     Objective:   Physical Exam        Assessment & Plan:       [1]  Allergies Allergen Reactions   Penicillins Rash and Swelling   Iodinated Contrast Media Swelling     Per pt: swelling with first contrast xray in 1969. Second contrast xray in the 90s without symptoms:  Patient has been pre-medicated for CT scans in 2016. Needs to have pre-medications before any scans with IV contrast. / TSF 08/30/16  [2]  Current Outpatient Medications:    acetaminophen  (TYLENOL ) 325 MG tablet, Take 650 mg by mouth every 6 (six) hours as needed for fever or mild pain (pain score 1-3)., Disp: , Rfl:    atorvastatin  (LIPITOR) 10 MG tablet, Take 10 mg by mouth every evening., Disp: , Rfl:    clopidogrel  (PLAVIX ) 75 MG tablet, Take 1 tablet (75 mg total) by mouth daily., Disp: , Rfl:    ertapenem  (INVANZ ) IVPB, Inject 1 g into the vein daily for 20 days. Indication:  Complicated UTI with ESBL E coli  Last Day of Therapy:  08/29/2024 Labs - Once weekly:  CBC/D and CMP Method of administration: Mini-Bag Plus / Gravity Method of administration may be changed at the discretion of facility and its pharmacy Please leave PICC in place until doctor has seen patient or been notified Fax weekly labs to [phone], Disp: 21 Units, Rfl: 0   estradiol (ESTRACE) 0.01 % CREA vaginal cream, Place 1 Applicatorful vaginally every Monday, Wednesday, and Friday., Disp: , Rfl:    feeding supplement, GLUCERNA SHAKE, (GLUCERNA SHAKE) LIQD, Take 237 mLs by mouth 3 (three) times daily between meals., Disp: , Rfl:    ferrous sulfate  325 (65 FE) MG tablet, Take 325 mg by mouth 2 (two) times daily with a meal., Disp: , Rfl:    gabapentin  (NEURONTIN ) 300 MG capsule, Take 1 capsule (300 mg total) by mouth at bedtime., Disp: , Rfl:    insulin  glargine (LANTUS ) 100 UNIT/ML injection, Inject 0.1 mLs (10 Units total) into the skin at bedtime., Disp: , Rfl:    insulin  lispro (HUMALOG) 100 UNIT/ML KwikPen, Inject 0-12 Units into the skin See admin instructions. Inject 3 times daily with meals per sliding scale:     0 - 150 : 0; 151 - 200 : 2; 201 - 250 : 4; 251 - 300 : 6; 301 - 350 : 8; 351 - 400 : 10; > 401 : 12  units and call provider, Disp: , Rfl:    levothyroxine  (SYNTHROID ) 50 MCG tablet, Take 50 mcg by mouth daily before breakfast., Disp: , Rfl:    LORazepam  (ATIVAN ) 0.5 MG tablet, Take 1 tablet (0.5 mg total) by mouth 2 (two) times daily., Disp: 14  tablet, Rfl: 0   MELATONIN PO, Take 6 mg by mouth at bedtime., Disp: , Rfl:    Multiple Vitamins-Minerals (MULTIVITAMIN WITH MINERALS) tablet, Take 1 tablet by mouth daily., Disp: , Rfl:    nitroGLYCERIN  (NITROSTAT ) 0.4 MG SL tablet, Place 0.4 mg under the tongue every 5 (five) minutes as needed for chest pain., Disp: , Rfl:    ondansetron  (ZOFRAN ) 4 MG tablet, Take 4 mg by mouth every 8 (eight) hours as needed for nausea or vomiting., Disp: , Rfl:    sennosides-docusate sodium  (SENOKOT-S) 8.6-50 MG tablet, Take 1 tablet by mouth daily., Disp: , Rfl:    sertraline  (ZOLOFT ) 50 MG tablet, Take 50 mg by mouth daily., Disp: , Rfl:   "

## 2024-08-26 ENCOUNTER — Ambulatory Visit: Payer: Self-pay | Admitting: Infectious Disease

## 2024-08-26 ENCOUNTER — Other Ambulatory Visit: Payer: Self-pay

## 2024-08-26 VITALS — BP 151/95 | HR 79 | Temp 97.6°F

## 2024-08-26 DIAGNOSIS — A498 Other bacterial infections of unspecified site: Secondary | ICD-10-CM

## 2024-08-26 DIAGNOSIS — N200 Calculus of kidney: Secondary | ICD-10-CM | POA: Diagnosis not present

## 2024-08-26 DIAGNOSIS — Z22358 Carrier of other enterobacterales: Secondary | ICD-10-CM

## 2024-08-26 DIAGNOSIS — E0810 Diabetes mellitus due to underlying condition with ketoacidosis without coma: Secondary | ICD-10-CM

## 2024-08-26 DIAGNOSIS — N12 Tubulo-interstitial nephritis, not specified as acute or chronic: Secondary | ICD-10-CM

## 2024-08-26 DIAGNOSIS — B9629 Other Escherichia coli [E. coli] as the cause of diseases classified elsewhere: Secondary | ICD-10-CM

## 2024-08-26 NOTE — Progress Notes (Signed)
 Antbx extension orders sent with SNF staff. Referral coordinator will call [facility] to schedule repeat CT.  Lorenda CHRISTELLA Code, RMA

## 2024-08-27 ENCOUNTER — Ambulatory Visit (HOSPITAL_COMMUNITY)

## 2024-09-01 ENCOUNTER — Ambulatory Visit (HOSPITAL_COMMUNITY)
Admission: RE | Admit: 2024-09-01 | Discharge: 2024-09-01 | Disposition: A | Discharge status: Home / Self Care | Source: Ambulatory Visit | Attending: Infectious Disease | Admitting: Infectious Disease

## 2024-09-01 DIAGNOSIS — Z1612 Extended spectrum beta lactamase (ESBL) resistance: Secondary | ICD-10-CM | POA: Insufficient documentation

## 2024-09-01 DIAGNOSIS — E0810 Diabetes mellitus due to underlying condition with ketoacidosis without coma: Secondary | ICD-10-CM | POA: Insufficient documentation

## 2024-09-01 DIAGNOSIS — K651 Peritoneal abscess: Secondary | ICD-10-CM | POA: Insufficient documentation

## 2024-09-01 DIAGNOSIS — N200 Calculus of kidney: Secondary | ICD-10-CM | POA: Insufficient documentation

## 2024-09-01 DIAGNOSIS — N12 Tubulo-interstitial nephritis, not specified as acute or chronic: Secondary | ICD-10-CM | POA: Insufficient documentation

## 2024-09-01 DIAGNOSIS — Z22358 Carrier of other enterobacterales: Secondary | ICD-10-CM | POA: Insufficient documentation

## 2024-09-01 DIAGNOSIS — A498 Other bacterial infections of unspecified site: Secondary | ICD-10-CM | POA: Diagnosis not present

## 2024-09-01 DIAGNOSIS — E1111 Type 2 diabetes mellitus with ketoacidosis with coma: Secondary | ICD-10-CM | POA: Diagnosis not present

## 2024-09-08 ENCOUNTER — Ambulatory Visit: Payer: Self-pay

## 2024-09-08 NOTE — Telephone Encounter (Signed)
 Called [facility], spoke with Lucie Dickens, RN and relayed verbal orders per Dr. Fleeta Rothman to stop IV antibiotics and pull PICC. She is asking if weekly labs still need to be drawn, advised no further labs needed since OPAT is finished.  Dawn Ho, BSN, RN

## 2024-09-10 ENCOUNTER — Ambulatory Visit: Admitting: Urology

## 2024-09-22 ENCOUNTER — Ambulatory Visit: Payer: Self-pay | Admitting: Internal Medicine

## 2024-10-09 ENCOUNTER — Ambulatory Visit: Admitting: Internal Medicine

## 2024-10-28 ENCOUNTER — Ambulatory Visit: Admitting: Urology

## 2024-12-07 ENCOUNTER — Ambulatory Visit: Admitting: Urology

## 2024-12-23 ENCOUNTER — Ambulatory Visit: Admitting: Urology
